# Patient Record
Sex: Female | Born: 1935
Health system: Southern US, Community
[De-identification: ages and names within clinical notes are randomized; demographics above are authoritative.]

## PROBLEM LIST (undated history)

## (undated) DIAGNOSIS — E7800 Pure hypercholesterolemia, unspecified: Secondary | ICD-10-CM

## (undated) DIAGNOSIS — F329 Major depressive disorder, single episode, unspecified: Secondary | ICD-10-CM

## (undated) DIAGNOSIS — K529 Noninfective gastroenteritis and colitis, unspecified: Secondary | ICD-10-CM

## (undated) DIAGNOSIS — K219 Gastro-esophageal reflux disease without esophagitis: Secondary | ICD-10-CM

## (undated) DIAGNOSIS — I10 Essential (primary) hypertension: Secondary | ICD-10-CM

## (undated) DIAGNOSIS — M84352K Stress fracture, left femur, subsequent encounter for fracture with nonunion: Secondary | ICD-10-CM

## (undated) DIAGNOSIS — M1990 Unspecified osteoarthritis, unspecified site: Secondary | ICD-10-CM

## (undated) DIAGNOSIS — G8929 Other chronic pain: Secondary | ICD-10-CM

## (undated) DIAGNOSIS — I639 Cerebral infarction, unspecified: Secondary | ICD-10-CM

## (undated) DIAGNOSIS — N059 Unspecified nephritic syndrome with unspecified morphologic changes: Secondary | ICD-10-CM

## (undated) DIAGNOSIS — K429 Umbilical hernia without obstruction or gangrene: Secondary | ICD-10-CM

## (undated) DIAGNOSIS — F419 Anxiety disorder, unspecified: Secondary | ICD-10-CM

## (undated) DIAGNOSIS — M353 Polymyalgia rheumatica: Secondary | ICD-10-CM

## (undated) DIAGNOSIS — I519 Heart disease, unspecified: Secondary | ICD-10-CM

## (undated) DIAGNOSIS — R011 Cardiac murmur, unspecified: Secondary | ICD-10-CM

## (undated) DIAGNOSIS — E119 Type 2 diabetes mellitus without complications: Secondary | ICD-10-CM

## (undated) DIAGNOSIS — K469 Unspecified abdominal hernia without obstruction or gangrene: Secondary | ICD-10-CM

## (undated) DIAGNOSIS — M549 Dorsalgia, unspecified: Secondary | ICD-10-CM

## (undated) DIAGNOSIS — F32A Depression, unspecified: Secondary | ICD-10-CM

## (undated) DIAGNOSIS — C50919 Malignant neoplasm of unspecified site of unspecified female breast: Secondary | ICD-10-CM

## (undated) DIAGNOSIS — J40 Bronchitis, not specified as acute or chronic: Secondary | ICD-10-CM

## (undated) HISTORY — PX: TUBAL LIGATION: SHX77

## (undated) HISTORY — DX: Heart disease, unspecified: I51.9

---

## 1971-09-23 DIAGNOSIS — C50919 Malignant neoplasm of unspecified site of unspecified female breast: Secondary | ICD-10-CM

## 1971-09-23 HISTORY — DX: Malignant neoplasm of unspecified site of unspecified female breast: C50.919

## 1971-09-23 HISTORY — PX: MASTECTOMY: SHX3

## 1979-09-23 HISTORY — PX: BREAST BIOPSY: SHX20

## 1998-10-02 ENCOUNTER — Other Ambulatory Visit: Admission: RE | Admit: 1998-10-02 | Discharge: 1998-10-02 | Payer: Self-pay | Admitting: Gynecology

## 1999-09-12 ENCOUNTER — Encounter: Admission: RE | Admit: 1999-09-12 | Discharge: 1999-09-12 | Payer: Self-pay | Admitting: Surgery

## 1999-09-12 ENCOUNTER — Encounter: Payer: Self-pay | Admitting: Surgery

## 1999-10-15 ENCOUNTER — Other Ambulatory Visit: Admission: RE | Admit: 1999-10-15 | Discharge: 1999-10-15 | Payer: Self-pay | Admitting: Gynecology

## 2000-06-22 HISTORY — PX: SHOULDER ARTHROSCOPY: SHX128

## 2000-06-23 ENCOUNTER — Ambulatory Visit (HOSPITAL_BASED_OUTPATIENT_CLINIC_OR_DEPARTMENT_OTHER): Admission: RE | Admit: 2000-06-23 | Discharge: 2000-06-23 | Payer: Self-pay | Admitting: Orthopedic Surgery

## 2000-09-17 ENCOUNTER — Encounter: Admission: RE | Admit: 2000-09-17 | Discharge: 2000-09-17 | Payer: Self-pay | Admitting: Internal Medicine

## 2000-09-17 ENCOUNTER — Encounter: Payer: Self-pay | Admitting: Internal Medicine

## 2000-10-20 ENCOUNTER — Other Ambulatory Visit: Admission: RE | Admit: 2000-10-20 | Discharge: 2000-10-20 | Payer: Self-pay | Admitting: Gynecology

## 2000-11-24 ENCOUNTER — Encounter: Admission: RE | Admit: 2000-11-24 | Discharge: 2000-11-24 | Payer: Self-pay | Admitting: Gynecology

## 2000-11-24 ENCOUNTER — Encounter: Payer: Self-pay | Admitting: Gynecology

## 2001-05-25 ENCOUNTER — Encounter: Admission: RE | Admit: 2001-05-25 | Discharge: 2001-08-23 | Payer: Self-pay | Admitting: Internal Medicine

## 2001-09-23 ENCOUNTER — Encounter: Payer: Self-pay | Admitting: Gynecology

## 2001-09-23 ENCOUNTER — Encounter: Admission: RE | Admit: 2001-09-23 | Discharge: 2001-09-23 | Payer: Self-pay | Admitting: Gynecology

## 2001-10-26 ENCOUNTER — Other Ambulatory Visit: Admission: RE | Admit: 2001-10-26 | Discharge: 2001-10-26 | Payer: Self-pay | Admitting: Gynecology

## 2002-09-27 ENCOUNTER — Encounter: Admission: RE | Admit: 2002-09-27 | Discharge: 2002-09-27 | Payer: Self-pay | Admitting: Surgery

## 2002-09-27 ENCOUNTER — Encounter: Payer: Self-pay | Admitting: Surgery

## 2002-11-01 ENCOUNTER — Other Ambulatory Visit: Admission: RE | Admit: 2002-11-01 | Discharge: 2002-11-01 | Payer: Self-pay | Admitting: Gynecology

## 2003-02-07 ENCOUNTER — Encounter: Admission: RE | Admit: 2003-02-07 | Discharge: 2003-02-07 | Payer: Self-pay | Admitting: Orthopedic Surgery

## 2003-02-07 ENCOUNTER — Encounter: Payer: Self-pay | Admitting: Orthopedic Surgery

## 2003-10-10 ENCOUNTER — Encounter: Admission: RE | Admit: 2003-10-10 | Discharge: 2003-10-10 | Payer: Self-pay | Admitting: Gynecology

## 2003-11-07 ENCOUNTER — Other Ambulatory Visit: Admission: RE | Admit: 2003-11-07 | Discharge: 2003-11-07 | Payer: Self-pay | Admitting: Gynecology

## 2004-10-10 ENCOUNTER — Encounter: Admission: RE | Admit: 2004-10-10 | Discharge: 2004-10-10 | Payer: Self-pay | Admitting: Surgery

## 2004-11-12 ENCOUNTER — Other Ambulatory Visit: Admission: RE | Admit: 2004-11-12 | Discharge: 2004-11-12 | Payer: Self-pay | Admitting: Gynecology

## 2005-07-11 ENCOUNTER — Encounter: Admission: RE | Admit: 2005-07-11 | Discharge: 2005-07-11 | Payer: Self-pay | Admitting: Internal Medicine

## 2005-10-14 ENCOUNTER — Encounter: Admission: RE | Admit: 2005-10-14 | Discharge: 2005-10-14 | Payer: Self-pay | Admitting: Surgery

## 2005-11-18 ENCOUNTER — Other Ambulatory Visit: Admission: RE | Admit: 2005-11-18 | Discharge: 2005-11-18 | Payer: Self-pay | Admitting: Gynecology

## 2006-09-03 ENCOUNTER — Encounter: Admission: RE | Admit: 2006-09-03 | Discharge: 2006-09-03 | Payer: Self-pay | Admitting: Internal Medicine

## 2006-10-15 ENCOUNTER — Encounter: Admission: RE | Admit: 2006-10-15 | Discharge: 2006-10-15 | Payer: Self-pay | Admitting: Surgery

## 2006-12-10 ENCOUNTER — Other Ambulatory Visit: Admission: RE | Admit: 2006-12-10 | Discharge: 2006-12-10 | Payer: Self-pay | Admitting: Gynecology

## 2007-10-19 ENCOUNTER — Encounter: Admission: RE | Admit: 2007-10-19 | Discharge: 2007-10-19 | Payer: Self-pay | Admitting: Internal Medicine

## 2008-03-02 ENCOUNTER — Encounter: Admission: RE | Admit: 2008-03-02 | Discharge: 2008-03-02 | Payer: Self-pay | Admitting: Orthopedic Surgery

## 2008-07-13 ENCOUNTER — Encounter: Admission: RE | Admit: 2008-07-13 | Discharge: 2008-07-13 | Payer: Self-pay | Admitting: Rheumatology

## 2008-10-20 ENCOUNTER — Encounter: Admission: RE | Admit: 2008-10-20 | Discharge: 2008-10-20 | Payer: Self-pay | Admitting: Internal Medicine

## 2009-10-23 ENCOUNTER — Encounter: Admission: RE | Admit: 2009-10-23 | Discharge: 2009-10-23 | Payer: Self-pay | Admitting: Surgery

## 2010-06-29 ENCOUNTER — Emergency Department (HOSPITAL_COMMUNITY): Admission: EM | Admit: 2010-06-29 | Discharge: 2010-06-29 | Payer: Self-pay | Admitting: Emergency Medicine

## 2010-10-30 ENCOUNTER — Other Ambulatory Visit: Payer: Self-pay | Admitting: Surgery

## 2010-10-30 DIAGNOSIS — Z853 Personal history of malignant neoplasm of breast: Secondary | ICD-10-CM

## 2010-11-07 ENCOUNTER — Ambulatory Visit
Admission: RE | Admit: 2010-11-07 | Discharge: 2010-11-07 | Disposition: A | Discharge status: Home / Self Care | Payer: Medicare Other | Source: Ambulatory Visit | Attending: Surgery | Admitting: Surgery

## 2010-11-07 DIAGNOSIS — Z853 Personal history of malignant neoplasm of breast: Secondary | ICD-10-CM

## 2011-02-07 NOTE — Op Note (Signed)
[facility]  Patient:    Cathy Taylor, Cathy Taylor                        MRN: [id number] Proc. Date: 06/23/00 Adm. Date:  [id number] Disc. Date: [id number] Attending:  Twana First                           Operative Report  PREOPERATIVE DIAGNOSIS:  Left shoulder rotator cuff tendonitis and impingement.  POSTOPERATIVE DIAGNOSES: 1. Left shoulder degenerative joint disease. 2. Left shoulder rotator cuff tendonitis and impingement.  PROCEDURE: 1. Left shoulder EUA followed by arthroscopic chondroplasty. 2. Left shoulder arthroscopic subacromial decompression.  SURGEON:  Elana Alm. Thurston Hole, M.D.  ASSISTANT:  Gifford Shave, P.A.  ANESTHESIA:  General.  OPERATIVE TIME:  45 minutes.  COMPLICATIONS:  None.  INDICATIONS FOR PROCEDURE:  [name] is a 77 year old woman who has had significantly increasing left shoulder pain for the past year, with symptoms and MRI documenting rotator cuff tendonitis and impingement.  She has failed conservative care and is now to undergo arthroscopy.  DESCRIPTION:  [name] is brought to the operating room on June 23, 2000 after supraclavicular block had been placed in the holding room.  She was placed on the operative table in supine position.  After being placed under general anesthesia, her left shoulder was examined under anesthesia.  Forward flexion to 170, abduction to 160, internal and external rotation of 80 degrees.  Shoulder was stable ligamentous exam.  She was then placed in a beach chair position and her shoulder and arm were prepped using sterile Betadine and draped using sterile technique.  Originally, through a posterior arthroscopic portal, the arthroscope with a pump attachment was placed and through an anterior portal, an arthroscopic probe was placed.  On initial inspection, the articular cartilage and the glenohumeral joint showed 75% grade 4 changes on the glenoid and the rest grade 3  changes with loose articular cartilage flaps that were thoroughly debrided.  She had diffuse grade 3 changes throughout her humeral head.  The anterior labrum showed partial tearing, 50% of the mid portion, which was debrided.  The superior labrum and biceps tendon anchor was intact.  The inferior labrum and anterior inferior glenohumeral ligaments were intact, and the posterior labrum was intact.  Biceps tendon itself was intact.  Inferior capsule recess was free of pathology.  The rotator cuff was thoroughly inspected.  She had fibrillation and evidence of tendonitis, but no evidence of a tear.  Subacromial space was entered, and a lateral arthroscopic portal was made. Moderately thickened bursitis was resected.  Underneath this, the rotator cuff was found to be moderately thickened and edematous, but no evidence of a tear. Subacromial decompression was carried out, removing 6-8 mm of the undersurface of the anterior, anteromedial, and anterolateral acromion, and CA ligament release carried out.  AC joint was not disturbed.  After this was done, the shoulder could be brought thorough a full range of motion with no impingement on the rotator cuff.  At this point, it was felt that all the pathology had been satisfactorily addressed.  The instruments were removed.  Portals were closed with 3-0 nylon suture and injected with 0.25% Marcaine with Epinephrine.  Sterile dressings and a sling were applied.  The patient was awakened and taken to the recovery room in stable condition.  FOLLOW-UP CARE:  [name] will be followed as an outpatient  on Vicodin and Naprosyn.  See her back in the office in a week for sutures out and follow-up.  Begin early physical therapy for range of motion followed by strengthening. DD:  06/23/00 TD:  06/23/00 Job: [id number]

## 2011-04-24 ENCOUNTER — Ambulatory Visit
Admission: RE | Admit: 2011-04-24 | Discharge: 2011-04-24 | Disposition: A | Discharge status: Home / Self Care | Payer: Medicare Other | Source: Ambulatory Visit | Attending: *Deleted | Admitting: *Deleted

## 2011-04-24 ENCOUNTER — Other Ambulatory Visit: Payer: Self-pay | Admitting: Internal Medicine

## 2011-04-24 ENCOUNTER — Other Ambulatory Visit: Payer: Self-pay | Admitting: *Deleted

## 2011-04-24 ENCOUNTER — Other Ambulatory Visit (HOSPITAL_COMMUNITY): Payer: Self-pay | Admitting: Orthopedic Surgery

## 2011-04-24 DIAGNOSIS — M19019 Primary osteoarthritis, unspecified shoulder: Secondary | ICD-10-CM

## 2011-04-24 DIAGNOSIS — S0990XA Unspecified injury of head, initial encounter: Secondary | ICD-10-CM

## 2011-04-24 DIAGNOSIS — M25519 Pain in unspecified shoulder: Secondary | ICD-10-CM

## 2011-05-02 ENCOUNTER — Encounter (HOSPITAL_COMMUNITY): Payer: Medicare Other

## 2011-05-02 ENCOUNTER — Encounter (HOSPITAL_COMMUNITY)
Admission: RE | Admit: 2011-05-02 | Discharge: 2011-05-02 | Disposition: A | Discharge status: Home / Self Care | Payer: Medicare Other | Source: Ambulatory Visit | Attending: Orthopedic Surgery | Admitting: Orthopedic Surgery

## 2011-05-02 ENCOUNTER — Ambulatory Visit (HOSPITAL_COMMUNITY): Payer: Medicare Other

## 2011-05-02 ENCOUNTER — Other Ambulatory Visit (HOSPITAL_COMMUNITY): Payer: Medicare Other

## 2011-05-02 DIAGNOSIS — M1990 Unspecified osteoarthritis, unspecified site: Secondary | ICD-10-CM | POA: Insufficient documentation

## 2011-05-02 DIAGNOSIS — W19XXXA Unspecified fall, initial encounter: Secondary | ICD-10-CM | POA: Insufficient documentation

## 2011-05-02 DIAGNOSIS — M19019 Primary osteoarthritis, unspecified shoulder: Secondary | ICD-10-CM

## 2011-05-02 DIAGNOSIS — M51379 Other intervertebral disc degeneration, lumbosacral region without mention of lumbar back pain or lower extremity pain: Secondary | ICD-10-CM | POA: Insufficient documentation

## 2011-05-02 DIAGNOSIS — M25519 Pain in unspecified shoulder: Secondary | ICD-10-CM

## 2011-05-02 DIAGNOSIS — M5137 Other intervertebral disc degeneration, lumbosacral region: Secondary | ICD-10-CM | POA: Insufficient documentation

## 2011-05-02 DIAGNOSIS — IMO0002 Reserved for concepts with insufficient information to code with codable children: Secondary | ICD-10-CM | POA: Insufficient documentation

## 2011-05-02 DIAGNOSIS — M79609 Pain in unspecified limb: Secondary | ICD-10-CM | POA: Insufficient documentation

## 2011-05-02 MED ORDER — TECHNETIUM TC 99M MEDRONATE IV KIT
25.0000 | PACK | Freq: Once | INTRAVENOUS | Status: AC | PRN
  Administered 2011-05-02: 25 via INTRAVENOUS

## 2011-07-07 ENCOUNTER — Encounter (HOSPITAL_COMMUNITY)
Admission: RE | Admit: 2011-07-07 | Discharge: 2011-07-07 | Disposition: A | Discharge status: Home / Self Care | Payer: Medicare Other | Source: Ambulatory Visit | Attending: Orthopedic Surgery | Admitting: Orthopedic Surgery

## 2011-07-07 ENCOUNTER — Other Ambulatory Visit (HOSPITAL_COMMUNITY): Payer: Self-pay | Admitting: Orthopedic Surgery

## 2011-07-07 ENCOUNTER — Ambulatory Visit (HOSPITAL_COMMUNITY)
Admission: RE | Admit: 2011-07-07 | Discharge: 2011-07-07 | Disposition: A | Discharge status: Home / Self Care | Payer: Medicare Other | Source: Ambulatory Visit | Attending: Orthopedic Surgery | Admitting: Orthopedic Surgery

## 2011-07-07 DIAGNOSIS — Z01812 Encounter for preprocedural laboratory examination: Secondary | ICD-10-CM | POA: Insufficient documentation

## 2011-07-07 DIAGNOSIS — Z01818 Encounter for other preprocedural examination: Secondary | ICD-10-CM | POA: Insufficient documentation

## 2011-07-07 DIAGNOSIS — I499 Cardiac arrhythmia, unspecified: Secondary | ICD-10-CM | POA: Insufficient documentation

## 2011-07-07 DIAGNOSIS — Z01810 Encounter for preprocedural cardiovascular examination: Secondary | ICD-10-CM | POA: Insufficient documentation

## 2011-07-07 DIAGNOSIS — M84353A Stress fracture, unspecified femur, initial encounter for fracture: Secondary | ICD-10-CM

## 2011-07-07 DIAGNOSIS — I4460 Unspecified fascicular block: Secondary | ICD-10-CM | POA: Insufficient documentation

## 2011-07-07 HISTORY — PX: FEMUR SURGERY: SHX943

## 2011-07-07 LAB — DIFFERENTIAL
Basophils Absolute: 0 10*3/uL (ref 0.0–0.1)
Basophils Relative: 0 % (ref 0–1)
Eosinophils Absolute: 0.1 10*3/uL (ref 0.0–0.7)
Lymphs Abs: 2.6 10*3/uL (ref 0.7–4.0)
Monocytes Relative: 8 % (ref 3–12)
Neutro Abs: 9 10*3/uL — ABNORMAL HIGH (ref 1.7–7.7)
Neutrophils Relative %: 71 % (ref 43–77)

## 2011-07-07 LAB — COMPREHENSIVE METABOLIC PANEL
ALT: 17 U/L (ref 0–35)
Alkaline Phosphatase: 73 U/L (ref 39–117)
BUN: 14 mg/dL (ref 6–23)
GFR calc non Af Amer: 79 mL/min — ABNORMAL LOW (ref >90)
Glucose, Bld: 107 mg/dL — ABNORMAL HIGH (ref 70–99)
Sodium: 136 mEq/L (ref 135–145)
Total Protein: 7.2 g/dL (ref 6.0–8.3)

## 2011-07-07 LAB — TYPE AND SCREEN

## 2011-07-07 LAB — CBC
HCT: 38.4 % (ref 36.0–46.0)
Platelets: 320 10*3/uL (ref 150–400)
RDW: 14.5 % (ref 11.5–15.5)

## 2011-07-07 LAB — PROTIME-INR: INR: 0.91 (ref 0.00–1.49)

## 2011-07-07 LAB — ABO/RH: ABO/RH(D): O POS

## 2011-07-08 ENCOUNTER — Inpatient Hospital Stay (HOSPITAL_COMMUNITY)
Admission: RE | Admit: 2011-07-08 | Discharge: 2011-07-10 | DRG: 481 | Disposition: A | Discharge status: Home Health | Payer: Medicare Other | Source: Ambulatory Visit | Attending: Orthopedic Surgery | Admitting: Orthopedic Surgery

## 2011-07-08 ENCOUNTER — Inpatient Hospital Stay (HOSPITAL_COMMUNITY): Payer: Medicare Other

## 2011-07-08 ENCOUNTER — Encounter (HOSPITAL_COMMUNITY): Payer: Self-pay | Admitting: Orthopedic Surgery

## 2011-07-08 DIAGNOSIS — Z01812 Encounter for preprocedural laboratory examination: Secondary | ICD-10-CM

## 2011-07-08 DIAGNOSIS — E7800 Pure hypercholesterolemia, unspecified: Secondary | ICD-10-CM | POA: Diagnosis present

## 2011-07-08 DIAGNOSIS — E119 Type 2 diabetes mellitus without complications: Secondary | ICD-10-CM | POA: Diagnosis present

## 2011-07-08 DIAGNOSIS — M069 Rheumatoid arthritis, unspecified: Secondary | ICD-10-CM | POA: Diagnosis present

## 2011-07-08 DIAGNOSIS — D62 Acute posthemorrhagic anemia: Secondary | ICD-10-CM | POA: Diagnosis not present

## 2011-07-08 DIAGNOSIS — Y998 Other external cause status: Secondary | ICD-10-CM

## 2011-07-08 DIAGNOSIS — M84352K Stress fracture, left femur, subsequent encounter for fracture with nonunion: Secondary | ICD-10-CM | POA: Diagnosis present

## 2011-07-08 DIAGNOSIS — M84353A Stress fracture, unspecified femur, initial encounter for fracture: Principal | ICD-10-CM | POA: Diagnosis present

## 2011-07-08 DIAGNOSIS — I10 Essential (primary) hypertension: Secondary | ICD-10-CM | POA: Diagnosis present

## 2011-07-08 DIAGNOSIS — Z01810 Encounter for preprocedural cardiovascular examination: Secondary | ICD-10-CM

## 2011-07-08 DIAGNOSIS — X58XXXA Exposure to other specified factors, initial encounter: Secondary | ICD-10-CM | POA: Diagnosis present

## 2011-07-08 HISTORY — DX: Stress fracture, left femur, subsequent encounter for fracture with nonunion: M84.352K

## 2011-07-08 LAB — GLUCOSE, CAPILLARY: Glucose-Capillary: 141 mg/dL — ABNORMAL HIGH (ref 70–99)

## 2011-07-09 LAB — BASIC METABOLIC PANEL
BUN: 12 mg/dL (ref 6–23)
CO2: 27 mEq/L (ref 19–32)
Chloride: 101 mEq/L (ref 96–112)
Creatinine, Ser: 0.75 mg/dL (ref 0.50–1.10)
Glucose, Bld: 181 mg/dL — ABNORMAL HIGH (ref 70–99)

## 2011-07-09 LAB — CBC
HCT: 27.3 % — ABNORMAL LOW (ref 36.0–46.0)
Hemoglobin: 9.4 g/dL — ABNORMAL LOW (ref 12.0–15.0)
MCV: 82 fL (ref 78.0–100.0)
RDW: 14.6 % (ref 11.5–15.5)
WBC: 13.6 10*3/uL — ABNORMAL HIGH (ref 4.0–10.5)

## 2011-07-10 NOTE — Op Note (Signed)
  NAMEMARVELLE, SPAN NO.:  [id number]  MEDICAL RECORD NO.:  [id number]  LOCATION:  2550                         FACILITY:  [facility]  PHYSICIAN:  Eulas Post, MD    DATE OF BIRTH:  [birth year]  DATE OF PROCEDURE:  07/08/2011 DATE OF DISCHARGE:                              OPERATIVE REPORT   ATTENDING SURGEON:  Eulas Post, MD.  FIRST ASSISTANT:  Janace Litten, orthopedic PA-C  PREOPERATIVE DIAGNOSIS:  Left femoral shaft stress fracture.  POSTOPERATIVE DIAGNOSIS:  Left femoral shaft stress fracture.  OPERATIVE PROCEDURE:  Left femoral shaft intramedullary nailing.  ANESTHESIA:  General.  ESTIMATED BLOOD LOSS:  50 mL.  OPERATIVE IMPLANTS:  Synthes lateral entry recon nail, size 9 mm x 380 mm with a single size 40 interlocking bolt transversely at the level of the lesser trochanter.  PREOPERATIVE INDICATIONS:  Ms. Icel Castles is a 77 year old woman who had a stress fracture of the left midshaft femur.  She failed conservative measures and continued to have progressive ongoing pain. She elected for surgical management.  The risks, benefits, and alternatives were discussed before the procedure including but not limited to risks of infection, bleeding, nerve injury, progression to complete fracture, incomplete relief of pain, persistent stress fracture, the need for revision surgery, cardiopulmonary complications, among others and she is willing to proceed.  OPERATIVE PROCEDURE:  The patient was brought to the operating room and placed in supine position.  IV antibiotics were given.  General anesthesia was administered.  She was placed on the fracture table.  The left lower extremity was placed in suspension and the leg was prepped and draped in usual sterile fashion.  Incision was made proximal to the greater trochanter.  Dissection was carried down and a guidewire was introduced into the appropriate position on the greater trochanter.  I then  reamed and opened the proximal femur and then placed the guidewire. I measured the length of the femur and then sequentially reamed up to a 10.5.  I had abundant chatter.  I selected size 9 nail.  I then inserted the nail without significant difficulty.  The C-arm was used to confirm position on AP and lateral views up and down the femur.  The proximal interlocking bolt was placed in the dynamic position in order to allow as much compression as possible at the stress fracture site.  Bicortical fixation was achieved.  Final C-arm pictures were taken and the jig apparatus was removed.  The wounds were closed with Vicryl and Monocryl with Steri-Strips and sterile gauze. She was awakened and returned to the PACU in stable and satisfactory condition.  There were no complications and she tolerated the procedure well.     Eulas Post, MD     JPL/MEDQ  D:  07/08/2011  T:  07/08/2011  Job:  [id number]  Electronically Signed by Teryl Lucy MD on 07/10/2011 10:42:20 AM

## 2011-07-14 NOTE — Discharge Summary (Signed)
  Cathy Taylor, Cathy Taylor                 ACCOUNT NO.:  [id number]  MEDICAL RECORD NO.:  [id number]  LOCATION:  5527                         FACILITY:  [facility]  PHYSICIAN:  Eulas Post, MD    DATE OF BIRTH:  1933/01/06  DATE OF ADMISSION:  07/08/2011 DATE OF DISCHARGE:  07/10/2011                              DISCHARGE SUMMARY   ADMISSION DIAGNOSIS:  Left femoral shaft stress fracture.  DISCHARGE DIAGNOSIS:  Left femoral shaft stress fracture.  PRIMARY PROCEDURE:  Left femoral shaft intramedullary nailing  HOSPITAL COURSE:  [name] is a 77 year old woman who had a stress fracture at left femur.  She elected for intramedullary nailing. She tolerated the procedure well and postoperatively did not have any complications.  She actually said her pain was nearly resolved at her stress fracture region after the nail was placed.  She was ambulating reasonably well with physical therapy and is planning to be discharged home with follow up with me in 2 weeks.  She is weightbearing as tolerated, and was taking p.o. analgesics.  She was given perioperative antibiotics for antimicrobial prophylaxis.  She was also given Lovenox bridging to Coumadin for DVT prophylaxis.  She benefited maximally from her hospital stay and will follow up with me in 2 weeks.  There were no complications.     Eulas Post, MD     JPL/MEDQ  D:  07/10/2011  T:  07/11/2011  Job:  [id number]  Electronically Signed by Teryl Lucy MD on 07/14/2011 82:95:62 PM

## 2011-07-24 HISTORY — PX: CATARACT EXTRACTION W/ INTRAOCULAR LENS IMPLANT: SHX1309

## 2011-09-10 ENCOUNTER — Other Ambulatory Visit: Payer: Self-pay

## 2011-09-10 ENCOUNTER — Encounter: Payer: Self-pay | Admitting: Internal Medicine

## 2011-09-10 ENCOUNTER — Emergency Department (HOSPITAL_COMMUNITY): Payer: Medicare Other

## 2011-09-10 ENCOUNTER — Other Ambulatory Visit: Payer: Self-pay | Admitting: Internal Medicine

## 2011-09-10 ENCOUNTER — Inpatient Hospital Stay (HOSPITAL_COMMUNITY)
Admission: EM | Admit: 2011-09-10 | Discharge: 2011-09-12 | DRG: 202 | Disposition: A | Discharge status: Home Health | Payer: Medicare Other | Source: Ambulatory Visit | Attending: Internal Medicine | Admitting: Internal Medicine

## 2011-09-10 ENCOUNTER — Inpatient Hospital Stay: Admission: AD | Admit: 2011-09-10 | Payer: Medicare Other | Source: Ambulatory Visit | Admitting: Internal Medicine

## 2011-09-10 DIAGNOSIS — I10 Essential (primary) hypertension: Secondary | ICD-10-CM | POA: Diagnosis present

## 2011-09-10 DIAGNOSIS — Z853 Personal history of malignant neoplasm of breast: Secondary | ICD-10-CM

## 2011-09-10 DIAGNOSIS — C50919 Malignant neoplasm of unspecified site of unspecified female breast: Secondary | ICD-10-CM | POA: Insufficient documentation

## 2011-09-10 DIAGNOSIS — R0600 Dyspnea, unspecified: Secondary | ICD-10-CM | POA: Insufficient documentation

## 2011-09-10 DIAGNOSIS — M353 Polymyalgia rheumatica: Secondary | ICD-10-CM | POA: Insufficient documentation

## 2011-09-10 DIAGNOSIS — R0609 Other forms of dyspnea: Secondary | ICD-10-CM | POA: Diagnosis present

## 2011-09-10 DIAGNOSIS — F341 Dysthymic disorder: Secondary | ICD-10-CM | POA: Diagnosis present

## 2011-09-10 DIAGNOSIS — J9819 Other pulmonary collapse: Secondary | ICD-10-CM | POA: Diagnosis present

## 2011-09-10 DIAGNOSIS — E119 Type 2 diabetes mellitus without complications: Secondary | ICD-10-CM | POA: Insufficient documentation

## 2011-09-10 DIAGNOSIS — E871 Hypo-osmolality and hyponatremia: Secondary | ICD-10-CM | POA: Diagnosis present

## 2011-09-10 DIAGNOSIS — IMO0002 Reserved for concepts with insufficient information to code with codable children: Secondary | ICD-10-CM

## 2011-09-10 DIAGNOSIS — J40 Bronchitis, not specified as acute or chronic: Principal | ICD-10-CM | POA: Diagnosis present

## 2011-09-10 DIAGNOSIS — R0989 Other specified symptoms and signs involving the circulatory and respiratory systems: Secondary | ICD-10-CM | POA: Diagnosis present

## 2011-09-10 HISTORY — DX: Bronchitis, not specified as acute or chronic: J40

## 2011-09-10 HISTORY — DX: Pure hypercholesterolemia, unspecified: E78.00

## 2011-09-10 HISTORY — DX: Unspecified abdominal hernia without obstruction or gangrene: K46.9

## 2011-09-10 HISTORY — DX: Anxiety disorder, unspecified: F41.9

## 2011-09-10 HISTORY — DX: Other chronic pain: G89.29

## 2011-09-10 HISTORY — DX: Essential (primary) hypertension: I10

## 2011-09-10 HISTORY — DX: Dorsalgia, unspecified: M54.9

## 2011-09-10 HISTORY — DX: Gastro-esophageal reflux disease without esophagitis: K21.9

## 2011-09-10 HISTORY — DX: Depression, unspecified: F32.A

## 2011-09-10 HISTORY — DX: Cardiac murmur, unspecified: R01.1

## 2011-09-10 HISTORY — DX: Unspecified osteoarthritis, unspecified site: M19.90

## 2011-09-10 HISTORY — DX: Polymyalgia rheumatica: M35.3

## 2011-09-10 HISTORY — DX: Major depressive disorder, single episode, unspecified: F32.9

## 2011-09-10 LAB — CBC
HCT: 35.8 % — ABNORMAL LOW (ref 36.0–46.0)
Hemoglobin: 12.2 g/dL (ref 12.0–15.0)
Hemoglobin: 12.1 g/dL (ref 12.0–15.0)
MCH: 27.6 pg (ref 26.0–34.0)
MCH: 27.6 pg (ref 26.0–34.0)
MCHC: 34.1 g/dL (ref 30.0–36.0)
MCHC: 33.8 g/dL (ref 30.0–36.0)
MCV: 81 fL (ref 78.0–100.0)
MCV: 81.7 fL (ref 78.0–100.0)
Platelets: ADEQUATE K/uL (ref 150–400)
Platelets: 300 10*3/uL (ref 150–400)
RBC: 4.42 MIL/uL (ref 3.87–5.11)
RDW: 14.3 % (ref 11.5–15.5)
WBC: 11.1 K/uL — ABNORMAL HIGH (ref 4.0–10.5)

## 2011-09-10 LAB — STREP PNEUMONIAE URINARY ANTIGEN: Strep Pneumo Urinary Antigen: NEGATIVE

## 2011-09-10 LAB — GLUCOSE, CAPILLARY

## 2011-09-10 LAB — BASIC METABOLIC PANEL WITH GFR
BUN: 10 mg/dL (ref 6–23)
CO2: 21 meq/L (ref 19–32)
Calcium: 10.1 mg/dL (ref 8.4–10.5)
Chloride: 95 meq/L — ABNORMAL LOW (ref 96–112)
Creatinine, Ser: 0.63 mg/dL (ref 0.50–1.10)
GFR calc Af Amer: >90 mL/min
GFR calc non Af Amer: 85 mL/min — ABNORMAL LOW
Glucose, Bld: 86 mg/dL (ref 70–99)
Potassium: 4.4 meq/L (ref 3.5–5.1)
Sodium: 130 meq/L — ABNORMAL LOW (ref 135–145)

## 2011-09-10 LAB — DIFFERENTIAL
Basophils Absolute: 0 10*3/uL (ref 0.0–0.1)
Eosinophils Relative: 1 % (ref 0–5)
Lymphocytes Relative: 19 % (ref 12–46)
Monocytes Relative: 8 % (ref 3–12)

## 2011-09-10 LAB — D-DIMER, QUANTITATIVE: D-Dimer, Quant: 0.68 ug{FEU}/mL — ABNORMAL HIGH (ref 0.00–0.48)

## 2011-09-10 LAB — CREATININE, SERUM: Creatinine, Ser: 0.63 mg/dL (ref 0.50–1.10)

## 2011-09-10 MED ORDER — METHYLPREDNISOLONE SODIUM SUCC 40 MG IJ SOLR
40.0000 mg | Freq: Once | INTRAMUSCULAR | Status: AC
  Administered 2011-09-10: 40 mg via INTRAVENOUS
  Filled 2011-09-10: qty 1

## 2011-09-10 MED ORDER — TRAMADOL HCL 50 MG PO TABS
50.0000 mg | ORAL_TABLET | Freq: Four times a day (QID) | ORAL | Status: DC | PRN
  Filled 2011-09-10: qty 1

## 2011-09-10 MED ORDER — SODIUM CHLORIDE 0.9 % IV SOLN
250.0000 mL | INTRAVENOUS | Status: DC | PRN
  Administered 2011-09-11: 250 mL via INTRAVENOUS

## 2011-09-10 MED ORDER — IOHEXOL 300 MG/ML  SOLN
100.0000 mL | Freq: Once | INTRAMUSCULAR | Status: AC | PRN
  Administered 2011-09-10: 100 mL via INTRAVENOUS

## 2011-09-10 MED ORDER — LEVOFLOXACIN IN D5W 500 MG/100ML IV SOLN
500.0000 mg | INTRAVENOUS | Status: DC
  Administered 2011-09-10 – 2011-09-11 (×2): 500 mg via INTRAVENOUS
  Filled 2011-09-10 (×3): qty 100

## 2011-09-10 MED ORDER — DULOXETINE HCL 30 MG PO CPEP
30.0000 mg | ORAL_CAPSULE | Freq: Every day | ORAL | Status: DC
  Administered 2011-09-10 – 2011-09-12 (×3): 30 mg via ORAL
  Filled 2011-09-10 (×3): qty 1

## 2011-09-10 MED ORDER — SODIUM CHLORIDE 0.9 % IJ SOLN
3.0000 mL | INTRAMUSCULAR | Status: DC | PRN
  Administered 2011-09-11: 3 mL via INTRAVENOUS

## 2011-09-10 MED ORDER — ASPIRIN 81 MG PO CHEW
81.0000 mg | CHEWABLE_TABLET | Freq: Every day | ORAL | Status: DC
  Administered 2011-09-10 – 2011-09-12 (×3): 81 mg via ORAL
  Filled 2011-09-10 (×3): qty 1

## 2011-09-10 MED ORDER — SODIUM CHLORIDE 0.9 % IJ SOLN
3.0000 mL | Freq: Two times a day (BID) | INTRAMUSCULAR | Status: DC
  Administered 2011-09-11 – 2011-09-12 (×2): 3 mL via INTRAVENOUS

## 2011-09-10 MED ORDER — GLIMEPIRIDE 2 MG PO TABS
2.0000 mg | ORAL_TABLET | Freq: Every day | ORAL | Status: DC
  Administered 2011-09-11 – 2011-09-12 (×2): 2 mg via ORAL
  Filled 2011-09-10 (×3): qty 1

## 2011-09-10 MED ORDER — SIMVASTATIN 20 MG PO TABS
20.0000 mg | ORAL_TABLET | Freq: Every day | ORAL | Status: DC
  Filled 2011-09-10: qty 1

## 2011-09-10 MED ORDER — EZETIMIBE 10 MG PO TABS
10.0000 mg | ORAL_TABLET | Freq: Every day | ORAL | Status: DC
  Administered 2011-09-10 – 2011-09-12 (×3): 10 mg via ORAL
  Filled 2011-09-10 (×3): qty 1

## 2011-09-10 MED ORDER — DILTIAZEM HCL ER COATED BEADS 240 MG PO CP24
240.0000 mg | ORAL_CAPSULE | Freq: Every day | ORAL | Status: DC
  Administered 2011-09-10 – 2011-09-12 (×3): 240 mg via ORAL
  Filled 2011-09-10 (×3): qty 1

## 2011-09-10 MED ORDER — BENAZEPRIL HCL 20 MG PO TABS
20.0000 mg | ORAL_TABLET | Freq: Every day | ORAL | Status: DC
  Administered 2011-09-10 – 2011-09-12 (×3): 20 mg via ORAL
  Filled 2011-09-10 (×3): qty 1

## 2011-09-10 MED ORDER — HEPARIN SODIUM (PORCINE) 5000 UNIT/ML IJ SOLN
5000.0000 [IU] | Freq: Three times a day (TID) | INTRAMUSCULAR | Status: DC
  Administered 2011-09-10 – 2011-09-12 (×6): 5000 [IU] via SUBCUTANEOUS
  Filled 2011-09-10 (×8): qty 1

## 2011-09-10 MED ORDER — INSULIN ASPART 100 UNIT/ML ~~LOC~~ SOLN
0.0000 [IU] | Freq: Three times a day (TID) | SUBCUTANEOUS | Status: DC
  Administered 2011-09-11: 3 [IU] via SUBCUTANEOUS
  Administered 2011-09-11: 2 [IU] via SUBCUTANEOUS
  Administered 2011-09-11: 7 [IU] via SUBCUTANEOUS
  Administered 2011-09-12: 1 [IU] via SUBCUTANEOUS
  Administered 2011-09-12: 2 [IU] via SUBCUTANEOUS
  Filled 2011-09-10: qty 3

## 2011-09-10 MED ORDER — PREDNISONE 20 MG PO TABS
20.0000 mg | ORAL_TABLET | Freq: Two times a day (BID) | ORAL | Status: DC
  Administered 2011-09-10: 20 mg via ORAL
  Filled 2011-09-10 (×4): qty 1

## 2011-09-10 MED ORDER — PANTOPRAZOLE SODIUM 40 MG PO TBEC
40.0000 mg | DELAYED_RELEASE_TABLET | Freq: Every day | ORAL | Status: DC
  Administered 2011-09-10 – 2011-09-12 (×3): 40 mg via ORAL
  Filled 2011-09-10 (×3): qty 1

## 2011-09-10 NOTE — ED Notes (Signed)
1610-96 READY

## 2011-09-10 NOTE — ED Notes (Signed)
5524-02 

## 2011-09-10 NOTE — H&P (Signed)
Patient: Cathy Taylor, Cathy Taylor DOB: Oct 30, 1932 Age: 77 Y Sex: Female Phone: [phone] Primary Insurance: BLUE MEDICARE Payer ID: [place]BS[place] Address: [address], [place], [place] Encounter Date: 09/10/2011 Provider: Renford Dills, MD Appointment Facility: [facility]  Subjective: Chief Complaint(s):  RP COUGH / CONGESTION NOT ANY BETTER W/ SOB / CHEST PAIN   HPI:  General Patient presents with complaint of cough and congestion not any better, she's now having some shortness of breath and some left-sided chest pain. She thinks it is from excessive coughing. At last visit she was not wheezing however today she does have audible wheezing. Patient is a nonsmoker. Her O2 sat is low today, she recently had an orthopedic procedure for a stress femur fracture. Despite albuterol treatment patient is still hypoxic, admission to the hospital as deemed necessary for further evaluation and treatment.  Current Medication:  Vitamin D 2000 UNIT Tablet one capsule once a day     Aspirin EC 81mg  Tablet Delayed Release daily Once a day     Citracal + D 250-200 MG-UNIT Tablet 1 tablet qd     Osteo Complex includes 1000 unit vitamin D Capsule 1 tablet qd     Cardizem CD 240 MG Capsule Extended Release 24 Hour 1 capsule Once a day     Voltaren 1 % Gel-5 pack as directed qid prn     Nexium 40 MG Capsule Delayed Release 1 capsule once a day     Glimepiride 1 MG Tablet TAKE 1 TABLET BY MOUTH EVERY DAY     Simvastatin 40 MG tablet 1 Once a day     Celebrex 200 MG Capsule TAKE 1 CAPSULE BY MOUTH ONCE A DAY     Lotensin HCT 20-12.5 MG Tablet 1 tablet Once a day     Lotrisone 1-0.05 % Cream 1 application to affected area Twice a day     Tylenol Ex St Arthritis Pain 500 MG Tablet 1 tablet as needed every 6 hrs     Zetia 10 MG Tablet 1 tablet Once a day     Cymbalta 30 MG Capsule Delayed Release Particles 1 capsule samples     PredniSONE 5 MG Tablet 1/2 tab daily     Metformin HCl 500  MG Tablet TAKE 2 TABLET BY MOUTH TWICE A DAY     Tramadol 50 mg tablet one tab prn     Azithromycin 250 MG Tablet 2 tablet on the first day, then 1 tablet daily for 4 days Once a day   Medical History:  bilateral thigh pain/stress reaction right left femur     history of polymyalgia rheumatica     osteoarthritis (left shoulder, bilateral knees, lumbar spine, [place] and Bouchard nodes)     GERD     hyperlipidemia     diabetes mellitus     insomnia     anxiety     hypertension     cataracts   Allergies/Intolerance:  N.K.D.A.   Surgical History:  left shoulder surgery 2001     chalazion     right mastectomy 1973 secondary to ductal carcinoma in situ     left breast biopsy 1975     tubal ligation     left femur nailing for stress fracture November 2012   Hospitalization:  surgery   Family History:  Father: deceased heart disease, blood clots     Mother: deceased hypertension     Brother 1: alive     Sister 1: alive fibromyalgia  Sister 2: alive diabetes mellitus     Sister 3: alive diabetes mellitus     1 brother(s) , 3 sister(s) . 2 son(s) , 3 daughter(s) .   Social History: General History of smoking cigarettes: Never smoked.  no Smoking.  no Alcohol.  no Recreational drug use.  ROS:   Objective: Vitals: Wt 171.6, Ht 62, BMI 31.38, Temp 98.0, Pulse sitting 76, BP sitting 142/78, Oxygen sat % 88%   Past Results: Examination:  General Examination Slightly anxious" label="GENERAL APPEARANCE" categoryPropId="10089" examid="[id number]"GENERAL APPEARANCE Slightly anxious. Reproducible chest wall pain with palpation left greater than right" label="CHEST:" categoryPropId="10663" examid="[id number]"CHEST: Reproducible chest wall pain with palpation left greater than right. Bilateral expiratory wheeze rhonchi" label="LUNGS:" categoryPropId="87" examid="[id number]"LUNGS: Bilateral expiratory wheeze rhonchi.  Physical Examination:   Assessment: Assessment:   Bronchitis - 490 (Primary), worst now with wheezing on exam     Dyspnea - 786.05, with recent ortho procedure dyspnea and low oxygen sat will check ddimer ct if elevated.     Plan: Treatment: Bronchitis Start Tussionex Pennkinetic ER Liquid Extended Release, 8-10 MG/5ML, 5 ml as needed, Orally, every 12 hrs, 10 days, 100 ml, Refills 0 Start PredniSONE 10 MG 6 day taper Tablet, 10 MG, 6-5-4-3-2-1, Orally, Once daily, 6 days, 21, Refills 0 Imaging:Chest PA/Lat  Phairas,Jodi 09/10/2011 11:51:28 AM > , x-ray completed.  Procedure:INJ DEPOMEDROL 80 MG Tysor,Sandy 09/10/2011 11:38:07 AM > given rt delt im/mfx:pharmacia/lot-g26526/exp-02/2014/ ndc 0009-0306-02 Procedure:ALBUTEROL per HHN 2.5MG  in 3cc (Charge 3 Units)-includes charge for treatment Tysor,Sandy 09/10/2011 11:38:51 AM > given/mfx:nephron/lot 48mb6/exp 04/2012/ ndc 2841-3244-01 Procedure:PULSE OXIMETRY Tysor,Sandy 09/10/2011 11:41:21 AM > 90 % room air. Procedures: Immunizations: Diagnostic Imaging: Lab Reports: Preventive Medicine:    Next Appointment:   Billing Information: Visit Code: Procedure Codes:  J1040 DEPOMEDROL 80 MG.     02725 INHALATION THERAPY.     D6644 ALBUTEROL INHAL UNIT DOSE 1 MG.     71020 XR CHEST 2V.     94760 PULSE OXIMETRY.

## 2011-09-10 NOTE — Progress Notes (Signed)
Pharmacy Consult - Antibiotic Renal Adjustment  Pt on levaquin D#1/7 per pneumonia admission orders.   Ht 64 inches, Wt 77 kg, IBW 55 kg  Creat 0.63/estimated CrCl ~60 ml/min  Noted that sputum and blood cultures are pending.  Plan: Continue levaquin 500mg  IV q24h. This dose is appropriate for current renal function.  Thanks.  Christoper Fabian, PharmD, BCPS 09/10/11 1905

## 2011-09-10 NOTE — ED Notes (Signed)
Pt sent to ED for admission dx with pneumonia.  Pt already had admission orders in system.  Waiting for bed placement

## 2011-09-11 DIAGNOSIS — J9819 Other pulmonary collapse: Secondary | ICD-10-CM | POA: Diagnosis present

## 2011-09-11 DIAGNOSIS — R0902 Hypoxemia: Secondary | ICD-10-CM

## 2011-09-11 DIAGNOSIS — J189 Pneumonia, unspecified organism: Secondary | ICD-10-CM

## 2011-09-11 LAB — GLUCOSE, CAPILLARY
Glucose-Capillary: 301 mg/dL — ABNORMAL HIGH (ref 70–99)
Glucose-Capillary: 168 mg/dL — ABNORMAL HIGH (ref 70–99)
Glucose-Capillary: 241 mg/dL — ABNORMAL HIGH (ref 70–99)

## 2011-09-11 LAB — LEGIONELLA ANTIGEN, URINE: Legionella Antigen, Urine: NEGATIVE

## 2011-09-11 LAB — INFLUENZA PANEL BY PCR (TYPE A & B)
Influenza A By PCR: NEGATIVE
Influenza B By PCR: NEGATIVE

## 2011-09-11 MED ORDER — ROSUVASTATIN CALCIUM 5 MG PO TABS
5.0000 mg | ORAL_TABLET | Freq: Every day | ORAL | Status: DC
  Administered 2011-09-11: 5 mg via ORAL
  Filled 2011-09-11 (×2): qty 1

## 2011-09-11 MED ORDER — ALBUTEROL SULFATE (5 MG/ML) 0.5% IN NEBU
2.5000 mg | INHALATION_SOLUTION | Freq: Four times a day (QID) | RESPIRATORY_TRACT | Status: DC
  Administered 2011-09-11 – 2011-09-12 (×3): 2.5 mg via RESPIRATORY_TRACT
  Filled 2011-09-11 (×3): qty 0.5

## 2011-09-11 MED ORDER — PREDNISONE 10 MG PO TABS
10.0000 mg | ORAL_TABLET | Freq: Two times a day (BID) | ORAL | Status: DC
  Administered 2011-09-11: 10 mg via ORAL
  Filled 2011-09-11 (×4): qty 1

## 2011-09-11 NOTE — Progress Notes (Signed)
Physical Therapy Evaluation Patient Details Name: Cathy Taylor MRN: [id number] DOB: 04/23/1933 Today's Date: 09/11/2011  Problem List:  Patient Active Problem List  Diagnoses  . Dyspnea  . Hypertension  . Diabetes mellitus  . PMR (polymyalgia rheumatica)  . Breast cancer  . Collapsed lung    Past Medical History:  Past Medical History  Diagnosis Date  . Stress fracture of left femur with nonunion 07/08/2011  . PMR (polymyalgia rheumatica)   . GERD (gastroesophageal reflux disease)   . Diabetes mellitus   . Anxiety   . Hypertension   . Cancer   . Depression   . Abdominal hernia   . Hypercholesteremia   . Heart murmur   . Bronchitis 09/10/11    "that's what I'm here for"  . Arthritis   . Chronic back pain greater than 3 months duration    Past Surgical History:  Past Surgical History  Procedure Date  . Tubal ligation   . Femur im nail 06/2011    left  . Cataract extraction w/ intraocular lens implant 07/2011    left  . Shoulder arthroscopy 06/2000    left  . Breast surgery   . Breast biopsy 1981    left  . Mastectomy 1973    right    PT Assessment/Plan/Recommendation PT Assessment Clinical Impression Statement: Pt. with decreased mobility secondary to left lower lobe collapse.  Pt. able to ambulate in hallway on room air with O2 staying above 90% throughout.  Ambulation was slow however pt. stated this was normal for her as she has an old left femur fracture. Encouraged pt. to use her RW as opposed to a cane upon return home for safety.  No further acute PT needs at this time.  PT Recommendation/Assessment: Patent does not need any further PT services No Skilled PT: Patient is modified independent with all activity/mobility;Patient at baseline level of functioning PT Recommendation Follow Up Recommendations: None Equipment Recommended: None recommended by PT PT Goals     PT Evaluation Precautions/Restrictions    Prior Functioning  Home Living Lives  With: Spouse Type of Home: House Home Layout: One level Home Access: Stairs to enter Entrance Stairs-Rails: Left (Going up ) Entrance Stairs-Number of Steps: 3 Bathroom Shower/Tub: Health visitor: Handicapped height Bathroom Accessibility: No Home Adaptive Equipment: Straight cane;Walker - rolling;Bedside commode/3-in-1 Prior Function Level of Independence: Independent with basic ADLs;Needs assistance with homemaking;Independent with transfers;Independent with gait;Requires assistive device for independence Light Housekeeping: Moderate Driving: Yes Vocation: Retired Producer, television/film/video: Awake/alert Overall Cognitive Status: Appears within functional limits for tasks assessed Sensation/Coordination   Extremity Assessment RLE Assessment RLE Assessment: Within Functional Limits LLE Assessment LLE Assessment: Within Functional Limits Mobility (including Balance) Bed Mobility Bed Mobility: No Transfers Transfers: Yes Sit to Stand: 6: Modified independent (Device/Increase time);From bed Stand to Sit: 6: Modified independent (Device/Increase time);With armrests;To chair/3-in-1 Ambulation/Gait Ambulation/Gait: Yes Ambulation/Gait Assistance: 6: Modified independent (Device/Increase time) Ambulation Distance (Feet): 200 Feet Assistive device: Rolling walker Gait Pattern: Within Functional Limits Stairs: Yes Stairs Assistance: 6: Modified independent (Device/Increase time) Stair Management Technique: One rail Left;Step to pattern Number of Stairs: 3  Height of Stairs: 8     Exercise    End of Session PT - End of Session Equipment Utilized During Treatment: Gait belt Activity Tolerance: Patient tolerated treatment well Patient left: in chair;with call bell in reach;with family/visitor present Nurse Communication: Mobility status for transfers;Mobility status for ambulation General Behavior During Session: [facility] for tasks performed Cognition:  [facility] for  tasks performed  Laney Pastor, SPT  09/11/2011, 1:22 PM

## 2011-09-11 NOTE — Progress Notes (Signed)
Received order for medication assistance from admission RN, please be aware that pt has insurance with medication benefits,  CM unable to assist in this situation. Will review meds at d/c for very expensive meds which may have assistance plans from manufactures.  Starlyn Skeans RN MPH Case Manager [phone]

## 2011-09-11 NOTE — Progress Notes (Signed)
Seen and agree with SPT note Marializ Ferrebee Tabor, PT [phone]  

## 2011-09-11 NOTE — Consult Note (Signed)
Patient: Cathy Taylor DOB: 1933/07/28 Date of Admission: 09/10/2011            Pulmonary consult  Date of Consult: 09/11/2011 MD requesting consult:  Reason for consult:   HPI -  77 yo female with hx hypertension, DM, polymyalgia rheumatica, GERD who was recently treated as an outpatient with bronchitis. She was treated with a Z-Pak. However her symptoms continued to worsen and she presented to her primary care office on 12/19 with continued complaints of cough, congestion, shortness of breath and chest pain. In the office she was hypoxic despite nebulizer treatment and she was sent to [facility] for direct admission by Dr. Nehemiah Settle. On December 20 CT scan showed left lower lobe collapse.  Allergies:  No Known Allergies   PMH: Past Medical History  Diagnosis Date  . Stress fracture of left femur with nonunion 07/08/2011  . PMR (polymyalgia rheumatica)   . GERD (gastroesophageal reflux disease)   . Diabetes mellitus   . Anxiety   . Hypertension   . Cancer   . Depression   . Abdominal hernia   . Hypercholesteremia   . Heart murmur   . Bronchitis 09/10/11    "that's what I'm here for"  . Arthritis   . Chronic back pain greater than 3 months duration     Home meds: Medications Prior to Admission  Medication Dose Route Frequency Provider Last Rate Last Dose  . 0.9 %  sodium chloride infusion  250 mL Intravenous PRN Deirdre Peer Polite      . albuterol (PROVENTIL) (5 MG/ML) 0.5% nebulizer solution 2.5 mg  2.5 mg Nebulization Q6H Ronald D Polite      . aspirin chewable tablet 81 mg  81 mg Oral Daily Deirdre Peer Polite   81 mg at 09/10/11 2128  . benazepril (LOTENSIN) tablet 20 mg  20 mg Oral Daily Deirdre Peer Polite   20 mg at 09/10/11 2130  . diltiazem (CARDIZEM CD) 24 hr capsule 240 mg  240 mg Oral Daily Deirdre Peer Polite   240 mg at 09/10/11 2130  . DULoxetine (CYMBALTA) DR capsule 30 mg  30 mg Oral Daily Deirdre Peer Polite   30 mg at 09/10/11 2129  . ezetimibe (ZETIA) tablet 10 mg  10 mg  Oral Daily Deirdre Peer Polite   10 mg at 09/10/11 2129  . glimepiride (AMARYL) tablet 2 mg  2 mg Oral Q breakfast Deirdre Peer Polite   2 mg at 09/11/11 0846  . heparin injection 5,000 Units  5,000 Units Subcutaneous Q8H Deirdre Peer Polite   5,000 Units at 09/11/11 1610  . insulin aspart (novoLOG) injection 0-9 Units  0-9 Units Subcutaneous TID WC Deirdre Peer Polite   7 Units at 09/11/11 0846  . iohexol (OMNIPAQUE) 300 MG/ML solution 100 mL  100 mL Intravenous Once PRN Medication Radiologist   100 mL at 09/10/11 1841  . levofloxacin (LEVAQUIN) IVPB 500 mg  500 mg Intravenous Q24H Deirdre Peer Polite   500 mg at 09/10/11 2005  . methylPREDNISolone sodium succinate (SOLU-MEDROL) 40 MG injection 40 mg  40 mg Intravenous Once Deirdre Peer Polite   40 mg at 09/10/11 2127  . pantoprazole (PROTONIX) EC tablet 40 mg  40 mg Oral Daily Deirdre Peer Polite   40 mg at 09/10/11 2135  . predniSONE (DELTASONE) tablet 10 mg  10 mg Oral BID WC Deirdre Peer Polite      . rosuvastatin (CRESTOR) tablet 5 mg  5 mg Oral q1800 Hilario [place],  PHARMD      . sodium chloride 0.9 % injection 3 mL  3 mL Intravenous Q12H Ronald D Polite      . sodium chloride 0.9 % injection 3 mL  3 mL Intravenous PRN Deirdre Peer Polite      . traMADol (ULTRAM) tablet 50 mg  50 mg Oral Q6H PRN Deirdre Peer Polite      . DISCONTD: predniSONE (DELTASONE) tablet 20 mg  20 mg Oral BID WC Deirdre Peer Polite   20 mg at 09/10/11 2127  . DISCONTD: simvastatin (ZOCOR) tablet 20 mg  20 mg Oral q1800 Deirdre Peer Polite       No current outpatient prescriptions on file as of 09/11/2011.     Social Hx: History   Social History  . Marital Status: Married    Spouse Name: N/A    Number of Children: N/A  . Years of Education: N/A   Occupational History  . Not on file.   Social History Main Topics  . Smoking status: Former Smoker    Types: Cigarettes  . Smokeless tobacco: Never Used   Comment: "just tried smoking; didn't smoke to amount to nothing"  . Alcohol Use: No  . Drug  Use: No  . Sexually Active: Not Currently   Other Topics Concern  . Not on file   Social History Narrative  . No narrative on file     Family Hx: History reviewed. No pertinent family history.   ROS: Patient complains of chronic back pain due to arthritis, cough with purulent sputum, shortness of breath. States chest pain has resolved. He does also complain of some mild right temporal/right ear/right neck pain. Denies fevers, chills, nausea, vomiting, headache, hemoptysis. No recent sick contacts. All other systems were reviewed and were negative  Filed Vitals:   09/10/11 1726 09/10/11 1851 09/10/11 2100 09/11/11 0700  BP: 132/71 175/75 150/82 126/68  Pulse: 83 93 97 74  Temp: 98 F (36.7 C) 98.2 F (36.8 C) 99.1 F (37.3 C) 98.1 F (36.7 C)  TempSrc: Oral  Oral Oral  Resp: 19 20 18 18   Height:  5\' 4"  (1.626 m)    Weight:  169 lb 12.1 oz (77 kg)    SpO2: 94% 97% 93% 94%    Rads Ct Chest W Contrast  09/10/2011  *RADIOLOGY REPORT*  Clinical Data: Short of breath.  Hypoxia  CT CHEST WITH CONTRAST  Technique:  Multidetector CT imaging of the chest was performed following the standard protocol during bolus administration of intravenous contrast.  Contrast: OMNIPAQUE IOHEXOL 300 MG/ML IV SOLN  Comparison: Chest x-ray 07/07/2011  Findings: Radiology order was for CT chest with contrast.  This was performed.  A CTA was not performed.  However there is good opacification of the pulmonary arteries.  This is not as sensitive as CTA would be for pulmonary emboli.  Allowing for this, no large pulmonary emboli are identified.  There is partial collapse of the left lower lobe.  No pleural effusion.  Right lung is clear.  No underlying mass lesion is identified.  IMPRESSION: Left lower lobe collapse.  No large pulmonary embolism is identified.  CTA technique was not utilized on this study.  Critical Value/emergent results were called by telephone at the time of interpretation on 09/10/2011   at 1855 hours  to  Dr. Donette Larry, who verbally acknowledged these results.  Original Report Authenticated By: Camelia Phenes, M.D.   CBC    Component Value Date/Time   WBC  12.8* 09/10/2011 1918   RBC 4.38 09/10/2011 1918   HGB 12.1 09/10/2011 1918   HCT 35.8* 09/10/2011 1918   PLT 300 09/10/2011 1918   MCV 81.7 09/10/2011 1918   MCH 27.6 09/10/2011 1918   MCHC 33.8 09/10/2011 1918   RDW 14.2 09/10/2011 1918   LYMPHSABS 2.1 09/10/2011 1654   MONOABS 0.9 09/10/2011 1654   EOSABS 0.1 09/10/2011 1654   BASOSABS 0.0 09/10/2011 1654   BMET    Component Value Date/Time   NA 130* 09/10/2011 1654   K 4.4 09/10/2011 1654   CL 95* 09/10/2011 1654   CO2 21 09/10/2011 1654   GLUCOSE 86 09/10/2011 1654   BUN 10 09/10/2011 1654   CREATININE 0.63 09/10/2011 1918   CALCIUM 10.1 09/10/2011 1654   GFRNONAA 85* 09/10/2011 1918   GFRAA >90 09/10/2011 1918   EXAM: General appearance: NAD, conversant  Eyes: anicteric sclerae, moist conjunctivae; no lid-lag; PERRLA HENT: Atraumatic; oropharynx clear with moist mucous membranes and no mucosal ulcerations; normal hard and soft palate Neck: Trachea midline; FROM, supple, no thyromegaly or lymphadenopathy Lungs: normal respiratory effort and no intercostal retractions, right side clear, left side with scattered rhonchi and pleural rub CV: RRR, no MRGs  Abdomen: Soft, non-tender; no masses or HSM Extremities: No peripheral edema or extremity lymphadenopathy Skin: Normal temperature, turgor and texture; no rash, ulcers or subcutaneous nodules Psych: Appropriate affect, alert and oriented to person, place and time  IMPRESSION/ PLAN:  LLL atx/ collapse -- no underlying mass/ lesion seen on CT.  Likely d/t mucous plugging in setting purulent bronchitis v ?CAP. Failed outpt rx. Pt in no distress, looks good clinically.  PLAN -  Aggressive pulm hygiene with vibravest, flutter valve, IS BD Cont abx - levaquin ok F/u CXR to ensure resolution Sputum  culture Flu panel pending O2 as needed  Does not necessarily need steroids at this time - ok to cont low-dose taper back to chronic dose   WHITEHEART,KATHRYN, NP 09/11/2011  12:10 PM  *Care during the described time interval was provided by me and/or other providers on the critical care team. I have reviewed this patient's available data, including medical history, events of note, physical examination and test results as part of my evaluation.  Patient's atelectasis is localized to a single area, the airway appears inflamed with ?of bronchiectasis on the CT.  The patient likely has high sputum production and needs more aggressive vibravest/flutter valve/IS.  Once acute infection is addressed would repeat CT and if atelectasis remains will need FOB to evaluate airway.  But in the meantime continue treatment with Abx.  If FOB it will likely result in atypical cells due to active infection so would just repeat once infection is treated.  Patient seen and examined, agree with above note.  I dictated the care and orders written for this patient under my direction.  Koren Bound, M.D. [phone]

## 2011-09-11 NOTE — Progress Notes (Signed)
Subjective: Patient is doing well, she still has a cough, it is now nonproductive. She denies fever she denies chills. She is stable with nasal cannula oxygen. CT scan has been review which reveals left lower lobe collapse, no effusion, no large pulmonary embolism. There is no comment on endobronchial lesion either.  Objective: Vital signs in last 24 hours: Temp:  [98 F (36.7 C)-99.1 F (37.3 C)] 98.1 F (36.7 C) (12/20 0700) Pulse Rate:  [74-97] 74  (12/20 0700) Resp:  [18-20] 18  (12/20 0700) BP: (126-175)/(67-82) 126/68 mmHg (12/20 0700) SpO2:  [91 %-97 %] 94 % (12/20 0700) Weight:  [77 kg (169 lb 12.1 oz)] 169 lb 12.1 oz (77 kg) (12/19 1851) Weight change:  Last BM Date: 09/10/11  Intake/Output from previous day:   Intake/Output this shift:    General appearance: alert, cooperative and no distress Resp: bibasilar rhonchi, no expiratory wheeze Cardio: regular rate and rhythm, S1, S2 normal, no murmur, click, rub or gallop Extremities: extremities normal, atraumatic, no cyanosis or edema  Lab Results:  Basename 09/10/11 1918 09/10/11 1654  WBC 12.8* 11.1*  HGB 12.1 12.2  HCT 35.8* 35.8*  PLT 300 PLATELET CLUMPS NOTED ON SMEAR, COUNT APPEARS ADEQUATE   BMET  Basename 09/10/11 1918 09/10/11 1654  NA -- 130*  K -- 4.4  CL -- 95*  CO2 -- 21  GLUCOSE -- 86  BUN -- 10  CREATININE 0.63 0.63  CALCIUM -- 10.1    Studies/Results: Ct Chest W Contrast  09/10/2011  *RADIOLOGY REPORT*  Clinical Data: Short of breath.  Hypoxia  CT CHEST WITH CONTRAST  Technique:  Multidetector CT imaging of the chest was performed following the standard protocol during bolus administration of intravenous contrast.  Contrast: OMNIPAQUE IOHEXOL 300 MG/ML IV SOLN  Comparison: Chest x-ray 07/07/2011  Findings: Radiology order was for CT chest with contrast.  This was performed.  A CTA was not performed.  However there is good opacification of the pulmonary arteries.  This is not as sensitive  as CTA would be for pulmonary emboli.  Allowing for this, no large pulmonary emboli are identified.  There is partial collapse of the left lower lobe.  No pleural effusion.  Right lung is clear.  No underlying mass lesion is identified.  IMPRESSION: Left lower lobe collapse.  No large pulmonary embolism is identified.  CTA technique was not utilized on this study.  Critical Value/emergent results were called by telephone at the time of interpretation on 09/10/2011  at 1855 hours  to  Dr. Donette Larry, who verbally acknowledged these results.  Original Report Authenticated By: Camelia Phenes, M.D.    Medications:  Prior to Admission:  Prescriptions prior to admission  Medication Sig Dispense Refill  . acetaminophen (TYLENOL) 500 MG tablet Take 500 mg by mouth every 6 (six) hours as needed. For pain       . aspirin EC 81 MG tablet Take 81 mg by mouth daily.        . benazepril-hydrochlorthiazide (LOTENSIN HCT) 20-12.5 MG per tablet Take 1 tablet by mouth daily.        . Calcium Citrate (CITRACAL PO) Take 1,000 mg by mouth daily.        . celecoxib (CELEBREX) 200 MG capsule Take 200 mg by mouth daily.        . clorazepate (TRANXENE) 3.75 MG tablet Take 3.75 mg by mouth 2 (two) times daily as needed. For anxiety       . diltiazem (DILACOR XR)  240 MG 24 hr capsule Take 240 mg by mouth daily.        . DULoxetine (CYMBALTA) 30 MG capsule Take 30 mg by mouth daily.        Marland Kitchen esomeprazole (NEXIUM) 40 MG capsule Take 40 mg by mouth daily.        Marland Kitchen ezetimibe (ZETIA) 10 MG tablet Take 10 mg by mouth daily.        Marland Kitchen glimepiride (AMARYL) 1 MG tablet Take 1 mg by mouth daily.        . metFORMIN (GLUCOPHAGE) 500 MG tablet Take 500 mg by mouth See admin instructions. Take two tablets in the morning and one tablet at night       . Multiple Vitamins-Minerals (MULTIVITAMINS THER. W/MINERALS) TABS Take 1 tablet by mouth daily.        . simvastatin (ZOCOR) 40 MG tablet Take 40 mg by mouth at bedtime.         Scheduled:    . aspirin  81 mg Oral Daily  . benazepril  20 mg Oral Daily  . diltiazem  240 mg Oral Daily  . DULoxetine  30 mg Oral Daily  . ezetimibe  10 mg Oral Daily  . glimepiride  2 mg Oral Q breakfast  . heparin  5,000 Units Subcutaneous Q8H  . insulin aspart  0-9 Units Subcutaneous TID WC  . levofloxacin (LEVAQUIN) IV  500 mg Intravenous Q24H  . methylPREDNISolone (SOLU-MEDROL) injection  40 mg Intravenous Once  . pantoprazole  40 mg Oral Daily  . predniSONE  20 mg Oral BID WC  . simvastatin  20 mg Oral q1800  . sodium chloride  3 mL Intravenous Q12H   Continuous:  ZOX:WRUEAV chloride, iohexol, sodium chloride, traMADol  Assessment/Plan: Dyspnea, rule out pneumonia, CT scan shows left lower lobe atelectasis. There is no effusion no endobronchial lesion to account for this. There is no large pulmonary emboli. At this time will continue antibiotic, incentive spirometry, frequent nebs, ask for pulmonary consult to see if any other treatment modality is indicated Diabetes continue sliding scale insulin, Amaryl, resume metformin Hypertension PMR History of breast CA Anxiety depression Stress fracture left femur status post IM nailing Mild hyponatremia Chronic steroid use, steroid slightly increased was slowly weaned back to baseline  LOS: 1 day   Danay Mckellar D 09/11/2011, 8:26 AM

## 2011-09-11 NOTE — Progress Notes (Signed)
Pharmacy - Interaction between diltiazem and simvastatin  Pt on both diltiazem and simvastatin here and PTA. The dose of simvastatin should not exceed 10mg /day in pts on concomitant diltiazem due to increased risk of rhabdomyolysis.  Simvastatin has been changed to equivalent dose of rosuvastatin in hospital. Please consider changing to a different statin at d/c if able or decreasing simvastatin dose to 10mg .  Thanks, Christoper Fabian, PharmD, BCPS 09/11/11   1150

## 2011-09-12 ENCOUNTER — Inpatient Hospital Stay (HOSPITAL_COMMUNITY): Payer: Medicare Other

## 2011-09-12 DIAGNOSIS — E871 Hypo-osmolality and hyponatremia: Secondary | ICD-10-CM | POA: Diagnosis present

## 2011-09-12 MED ORDER — PREDNISONE 2.5 MG PO TABS: 2.5000 mg | ORAL_TABLET | ORAL | Status: AC

## 2011-09-12 MED ORDER — PREDNISONE 2.5 MG PO TABS
2.5000 mg | ORAL_TABLET | Freq: Two times a day (BID) | ORAL | Status: DC
  Filled 2011-09-12 (×2): qty 1

## 2011-09-12 MED ORDER — PREDNISONE 5 MG PO TABS
5.0000 mg | ORAL_TABLET | Freq: Two times a day (BID) | ORAL | Status: DC
  Filled 2011-09-12 (×2): qty 1

## 2011-09-12 MED ORDER — ALBUTEROL SULFATE HFA 108 (90 BASE) MCG/ACT IN AERS: 2.0000 | INHALATION_SPRAY | Freq: Four times a day (QID) | RESPIRATORY_TRACT | Status: DC

## 2011-09-12 MED ORDER — ALBUTEROL SULFATE HFA 108 (90 BASE) MCG/ACT IN AERS
2.0000 | INHALATION_SPRAY | Freq: Four times a day (QID) | RESPIRATORY_TRACT | Status: DC
  Administered 2011-09-12: 2 via RESPIRATORY_TRACT
  Filled 2011-09-12 (×2): qty 6.7

## 2011-09-12 NOTE — Progress Notes (Signed)
Patient discharge teaching given, including activity, diet, follow-up appoints, and medications. Patient verbalized understanding of all discharge instructions. IV access was d/c'd. Vitals are stable. Skin is intac. Pt to be escorted out by NT, to be driven home by family.

## 2011-09-12 NOTE — Progress Notes (Signed)
Subjective: Patient doing well, without fever or chills without copious sputum production, O2 sats 93% on room air. Pulmonary critical care input appreciated.  Objective: Vital signs in last 24 hours: Temp:  [97.9 F (36.6 C)-98.1 F (36.7 C)] 97.9 F (36.6 C) (12/21 0547) Pulse Rate:  [64-81] 64  (12/21 0547) Resp:  [18-20] 19  (12/21 0547) BP: (110-133)/(65-79) 114/65 mmHg (12/21 0547) SpO2:  [92 %-97 %] 93 % (12/21 0808) Weight change:  Last BM Date: 09/11/11  Intake/Output from previous day: 12/20 0701 - 12/21 0700 In: 720 [P.O.:720] Out: -  Intake/Output this shift:    General appearance: alert, cooperative and no distress Resp: bibasilar rhonchi, minimal improvement with deep cough Cardio: regular rate and rhythm, S1, S2 normal, no murmur, click, rub or gallop Extremities: extremities normal, atraumatic, no cyanosis or edema  Lab Results:  [facility] 09/10/11 1918 09/10/11 1654  WBC 12.8* 11.1*  HGB 12.1 12.2  HCT 35.8* 35.8*  PLT 300 PLATELET CLUMPS NOTED ON SMEAR, COUNT APPEARS ADEQUATE   BMET  Basename 09/10/11 1918 09/10/11 1654  NA -- 130*  K -- 4.4  CL -- 95*  CO2 -- 21  GLUCOSE -- 86  BUN -- 10  CREATININE 0.63 0.63  CALCIUM -- 10.1    Studies/Results: Ct Chest W Contrast  09/10/2011  *RADIOLOGY REPORT*  Clinical Data: Short of breath.  Hypoxia  CT CHEST WITH CONTRAST  Technique:  Multidetector CT imaging of the chest was performed following the standard protocol during bolus administration of intravenous contrast.  Contrast: OMNIPAQUE IOHEXOL 300 MG/ML IV SOLN  Comparison: Chest x-ray 07/07/2011  Findings: Radiology order was for CT chest with contrast.  This was performed.  A CTA was not performed.  However there is good opacification of the pulmonary arteries.  This is not as sensitive as CTA would be for pulmonary emboli.  Allowing for this, no large pulmonary emboli are identified.  There is partial collapse of the left lower lobe.  No pleural  effusion.  Right lung is clear.  No underlying mass lesion is identified.  IMPRESSION: Left lower lobe collapse.  No large pulmonary embolism is identified.  CTA technique was not utilized on this study.  Critical Value/emergent results were called by telephone at the time of interpretation on 09/10/2011  at 1855 hours  to  Dr. Donette Larry, who verbally acknowledged these results.  Original Report Authenticated By: Camelia Phenes, M.D.    Medications:  Prior to Admission:  Prescriptions prior to admission  Medication Sig Dispense Refill  . acetaminophen (TYLENOL) 500 MG tablet Take 500 mg by mouth every 6 (six) hours as needed. For pain       . aspirin EC 81 MG tablet Take 81 mg by mouth daily.        . benazepril-hydrochlorthiazide (LOTENSIN HCT) 20-12.5 MG per tablet Take 1 tablet by mouth daily.        . Calcium Citrate (CITRACAL PO) Take 1,000 mg by mouth daily.        . celecoxib (CELEBREX) 200 MG capsule Take 200 mg by mouth daily.        . clorazepate (TRANXENE) 3.75 MG tablet Take 3.75 mg by mouth 2 (two) times daily as needed. For anxiety       . diltiazem (DILACOR XR) 240 MG 24 hr capsule Take 240 mg by mouth daily.        . DULoxetine (CYMBALTA) 30 MG capsule Take 30 mg by mouth daily.        Marland Kitchen  esomeprazole (NEXIUM) 40 MG capsule Take 40 mg by mouth daily.        Marland Kitchen ezetimibe (ZETIA) 10 MG tablet Take 10 mg by mouth daily.        Marland Kitchen glimepiride (AMARYL) 1 MG tablet Take 1 mg by mouth daily.        . metFORMIN (GLUCOPHAGE) 500 MG tablet Take 500 mg by mouth See admin instructions. Take two tablets in the morning and one tablet at night       . Multiple Vitamins-Minerals (MULTIVITAMINS THER. W/MINERALS) TABS Take 1 tablet by mouth daily.        . simvastatin (ZOCOR) 40 MG tablet Take 40 mg by mouth at bedtime.         Scheduled:   . albuterol  2.5 mg Nebulization Q6H  . aspirin  81 mg Oral Daily  . benazepril  20 mg Oral Daily  . diltiazem  240 mg Oral Daily  . DULoxetine  30 mg Oral  Daily  . ezetimibe  10 mg Oral Daily  . glimepiride  2 mg Oral Q breakfast  . heparin  5,000 Units Subcutaneous Q8H  . insulin aspart  0-9 Units Subcutaneous TID WC  . levofloxacin (LEVAQUIN) IV  500 mg Intravenous Q24H  . pantoprazole  40 mg Oral Daily  . predniSONE  10 mg Oral BID WC  . rosuvastatin  5 mg Oral q1800  . sodium chloride  3 mL Intravenous Q12H  . DISCONTD: predniSONE  20 mg Oral BID WC  . DISCONTD: simvastatin  20 mg Oral q1800   Continuous:  JXB:JYNWGN chloride, sodium chloride, traMADol  Assessment/Plan: Hypoxia improved secondary to left lower lobe atelectasis, we would change antibiotics to p.o., continue total a seven-day course, check followup x-ray. Continue frequent nebs. Cultures have been reviewed, influenza PCR is negative. More than likely patient developed left lower lobe atelectasis with subsequent collapse from mucous plugging as CT did not reveal any other pathology. Currently his sats are stable, no indication for bronchoscopy at this time. Pending followup x-ray plan for discharge to home.  LOS: 2 days   Shakala Marlatt D 09/12/2011, 8:32 AM

## 2011-09-12 NOTE — Progress Notes (Addendum)
Patient: Cathy Taylor DOB: [birth year] Date of Admission: 09/10/2011            Pulmonary FU Date of Consult: 09/12/2011 MD requesting consult:  Reason for consult:   HPI -  77 yo female with hx hypertension, DM, polymyalgia rheumatica, GERD who was recently treated as an outpatient with bronchitis. She was treated with a Z-Pak. However her symptoms continued to worsen and she presented to her primary care office on 12/19 with continued complaints of cough, congestion, shortness of breath and chest pain. In the office she was hypoxic despite nebulizer treatment and she was sent to [facility] for direct admission by Dr. Nehemiah Settle. On December 20 CT scan showed left lower lobe collapse.  Allergies:  No Known Allergies    ROS: Patient complains of chronic back pain due to arthritis, cough with purulent sputum, shortness of breath. States chest pain has resolved.. Denies fevers, chills, nausea, vomiting, headache, hemoptysis. No recent sick contacts. All other systems were reviewed and were negative  Filed Vitals:   09/11/11 1452 09/11/11 2213 09/12/11 0547 09/12/11 0808  BP:  127/67 114/65   Pulse:  76 64   Temp:  98.1 F (36.7 C) 97.9 F (36.6 C)   TempSrc:  Oral Oral   Resp:  20 19   Height:      Weight:      SpO2: 95% 95% 97% 93%    CBC    Component Value Date/Time   WBC 12.8* 09/10/2011 1918   RBC 4.38 09/10/2011 1918   HGB 12.1 09/10/2011 1918   HCT 35.8* 09/10/2011 1918   PLT 300 09/10/2011 1918   MCV 81.7 09/10/2011 1918   MCH 27.6 09/10/2011 1918   MCHC 33.8 09/10/2011 1918   RDW 14.2 09/10/2011 1918   LYMPHSABS 2.1 09/10/2011 1654   MONOABS 0.9 09/10/2011 1654   EOSABS 0.1 09/10/2011 1654   BASOSABS 0.0 09/10/2011 1654   BMET    Component Value Date/Time   NA 130* 09/10/2011 1654   K 4.4 09/10/2011 1654   CL 95* 09/10/2011 1654   CO2 21 09/10/2011 1654   GLUCOSE 86 09/10/2011 1654   BUN 10 09/10/2011 1654   CREATININE 0.63 09/10/2011 1918   CALCIUM 10.1  09/10/2011 1654   GFRNONAA 85* 09/10/2011 1918   GFRAA >90 09/10/2011 1918   EXAM: General appearance: NAD, conversant  Eyes: anicteric sclerae, moist conjunctivae; no lid-lag; PERRLA HENT: Atraumatic; oropharynx clear with moist mucous membranes and no mucosal ulcerations; normal hard and soft palate Neck: Trachea midline; FROM, supple, no thyromegaly or lymphadenopathy Lungs: normal respiratory effort and no intercostal retractions, right side clear, left side decerased , bibasal coarse crackles CV: RRR, no MRGs  Abdomen: Soft, non-tender; no masses or HSM Extremities: No peripheral edema or extremity lymphadenopathy Skin: Normal temperature, turgor and texture; no rash, ulcers or subcutaneous nodules Psych: Appropriate affect, alert and oriented to person, place and time  IMPRESSION/ PLAN:  LLL atx/ collapse -- no underlying mass/ lesion seen on CT.  Likely d/t mucous plugging in setting purulent bronchitis v ?CAP. Failed outpt rx. Pt in no distress, looks good clinically.  PLAN -  Aggressive pulm hygiene with vibravest, flutter valve, IS BD Cont  levaquin x 10 ds Patient's atelectasis is localized to a single area, the airway appears with ?of bronchiectasis on the CT.  The patient likely has high sputum production and needs more aggressive vibravest/flutter valve/IS.  Once acute infection is addressed would repeat imaging and if atelectasis remains  will need FOB to evaluate airway.  But in the meantime continue treatment with Abx.  Does not necessarily need steroids at this time - ok to cont low-dose taper back to chronic dose  PCCM to sign off - FU appt made on 1/16  [facility] V., NP 09/12/2011  8:29 AM

## 2011-09-12 NOTE — Discharge Summary (Signed)
Physician Discharge Summary  Patient ID: Cathy Taylor MRN: [id number] DOB/AGE: 05/04/1933 77 y.o.  Admit date: 09/10/2011 Discharge date: 09/12/2011  Admission Diagnoses:  Discharge Diagnoses:  Principal Problem:  *Collapsed lung Active Problems:  Dyspnea  Hyponatremia   Discharged Condition: good  Hospital Course: Patient was directly admitted to the hospital for hypoxia and abnormal x-ray showing atelectasis versus Infiltrate in the left lower lobe. CT scan confirmed left lower lobe collapse, patient was treated with empiric antibiotics oxygen and bronchodilator. Pulmonary critical care medicine was consult, no additional intervention was indicated. Patient's followup O2 sats were normal, followup x-ray shows significant improvement in collapse, patient is clinically stable without shortness of breath, stable without oxygen. She will continue frequent nebulizer treatments. She will have followup in one to 2 weeks his primary M.D. Consults: pulmonary/intensive care  Significant Diagnostic Studies: CT of the chest showed left lower lobe collapse, no endobronchial lesion no effusion.  Treatments: antibiotics: Levaquin and respiratory therapy: O2, albuterol/atropine nebulizer and chest PT  Discharge Exam: Blood pressure 123/73, pulse 64, temperature 97.9 F (36.6 C), temperature source Oral, resp. rate 19, height 5\' 4"  (1.626 m), weight 77 kg (169 lb 12.1 oz), SpO2 93.00%. General appearance: alert and cooperative Resp: bibasilar rhonchi, some improvement with the cough Cardio: regular rate and rhythm, S1, S2 normal, no murmur, click, rub or gallop  Disposition: Home-Health Care Svc  Discharge Orders    Future Appointments: Provider: Department: Dept Phone: Center:   10/08/2011 11:15 AM Comer Locket. Vassie Loll, MD [facility] [phone] None     Medication List  As of 09/12/2011  2:22 PM   START taking these medications         predniSONE 2.5 MG tablet   Commonly known as:  DELTASONE   Take 1 tablet (2.5 mg total) by mouth 1 day or 1 dose.         CONTINUE taking these medications         acetaminophen 500 MG tablet   Commonly known as: TYLENOL      aspirin EC 81 MG tablet      benazepril-hydrochlorthiazide 20-12.5 MG per tablet   Commonly known as: LOTENSIN HCT      celecoxib 200 MG capsule   Commonly known as: CELEBREX      CITRACAL PO      clorazepate 3.75 MG tablet   Commonly known as: TRANXENE      diltiazem 240 MG 24 hr capsule   Commonly known as: DILACOR XR      DULoxetine 30 MG capsule   Commonly known as: CYMBALTA      esomeprazole 40 MG capsule   Commonly known as: NEXIUM      ezetimibe 10 MG tablet   Commonly known as: ZETIA      glimepiride 1 MG tablet   Commonly known as: AMARYL      metFORMIN 500 MG tablet   Commonly known as: GLUCOPHAGE      multivitamins ther. w/minerals Tabs      simvastatin 40 MG tablet   Commonly known as: ZOCOR          Where to get your medications       Information on where to get these meds is not yet available. Ask your nurse or doctor.         predniSONE 2.5 MG tablet           Follow-up Information    Follow up with Oretha Milch., MD on 10/08/2011. (11:15 AM)  Contact information:   [facility] [address] [place] [address] [phone]       Follow up with Arleene Settle D in 2 weeks.   Contact information:   [address], [address] [employer] [address] [place] [address] [phone]          Signed: Katy Apo 09/12/2011, 2:22 PM

## 2011-09-17 LAB — CULTURE, BLOOD (ROUTINE X 2)
Culture  Setup Time: 201212200136
Culture: NO GROWTH

## 2011-10-08 ENCOUNTER — Ambulatory Visit (INDEPENDENT_AMBULATORY_CARE_PROVIDER_SITE_OTHER)
Admission: RE | Admit: 2011-10-08 | Discharge: 2011-10-08 | Disposition: A | Discharge status: Home / Self Care | Payer: Medicare Other | Source: Ambulatory Visit | Attending: Pulmonary Disease | Admitting: Pulmonary Disease

## 2011-10-08 ENCOUNTER — Encounter: Payer: Self-pay | Admitting: Pulmonary Disease

## 2011-10-08 ENCOUNTER — Ambulatory Visit (INDEPENDENT_AMBULATORY_CARE_PROVIDER_SITE_OTHER): Payer: Medicare Other | Admitting: Pulmonary Disease

## 2011-10-08 DIAGNOSIS — J9819 Other pulmonary collapse: Secondary | ICD-10-CM

## 2011-10-08 DIAGNOSIS — J479 Bronchiectasis, uncomplicated: Secondary | ICD-10-CM

## 2011-10-08 NOTE — Patient Instructions (Signed)
Chest xray today - we will call you with results You have a condition called BRONCHIECTASIS - this can predispose you to attacks of bronchitis Call for antibiotic if  fever, increased yellow-green sputum or wheezing

## 2011-10-08 NOTE — Progress Notes (Signed)
  Subjective:    Patient ID: Cathy Taylor, female    DOB: 1932/10/19, 78 y.o.   MRN: [id number]  HPI PCP - [facility] - Donald Siva - Wyline Mood  78 yo female with hx hypertension, DM, polymyalgia rheumatica (on prednisone), GERD who was recently treated as an outpatient with bronchitis. She was treated with a Z-Pak. However her symptoms continued to worsen and she presented to her primary care office on 12/19 with continued complaints of cough, congestion, shortness of breath and chest pain. In the office she was hypoxic despite nebulizer treatment and she was sent to [facility] for direct admission by Dr. Nehemiah Settle. On December 20 CT scan showed left lower lobe collapse. LLL atx/ collapse -- no underlying mass/ lesion seen on CT. Likely d/t mucous plugging in setting purulent bronchitis v ?CAP. Failed outpt rx. Pt in no distress, looks good clinically.  Took t levaquin x 10 ds  CXR 12/21 showed improved aeraation of LLL    Review of Systems Patient denies significant dyspnea,cough, hemoptysis,  chest pain, palpitations, pedal edema, orthopnea, paroxysmal nocturnal dyspnea, lightheadedness, nausea, vomiting, abdominal or  leg pains      Objective:   Physical Exam  Gen. Pleasant, well-nourished, in no distress ENT - no lesions, no post nasal drip Neck: No JVD, no thyromegaly, no carotid bruits Lungs: no use of accessory muscles, no dullness to percussion, LLL rales  Cardiovascular: Rhythm regular, heart sounds  normal, no murmurs or gallops, no peripheral edema Musculoskeletal: No deformities, no cyanosis or clubbing        Assessment & Plan:

## 2011-10-10 DIAGNOSIS — J479 Bronchiectasis, uncomplicated: Secondary | ICD-10-CM | POA: Insufficient documentation

## 2011-10-10 NOTE — Assessment & Plan Note (Signed)
this can predispose you to attacks of bronchitis Call for antibiotic if  fever, increased yellow-green sputum or wheezing

## 2011-10-10 NOTE — Assessment & Plan Note (Signed)
Resolved on CXR 

## 2012-01-02 ENCOUNTER — Other Ambulatory Visit: Payer: Self-pay | Admitting: Orthopedic Surgery

## 2012-01-02 ENCOUNTER — Ambulatory Visit
Admission: RE | Admit: 2012-01-02 | Discharge: 2012-01-02 | Disposition: A | Discharge status: Home / Self Care | Payer: Medicare Other | Source: Ambulatory Visit | Attending: Orthopedic Surgery | Admitting: Orthopedic Surgery

## 2012-01-02 DIAGNOSIS — M25512 Pain in left shoulder: Secondary | ICD-10-CM

## 2012-01-02 NOTE — Pre-Procedure Instructions (Addendum)
20 Cathy Taylor  01/02/2012   Your procedure is scheduled on: 01/06/12  Report to [facility] at 1030 AM.  Call this number if you have problems the morning of surgery: [phone]   Remember:   Do not eat food:After Midnight.  May have clear liquids: up to 4 Hours before arrival. 630 am  Clear liquids include soda, tea, black coffee, apple or grape juice, broth.  Take these medicines the morning of surgery with A SIP OF WATER: albuterol inhaler,,,diltiazem, , nexium, predisone, tramodol STOP aspirin,celebrex, voltaren, multi vit, if not already stopped   Do not wear jewelry, make-up or nail polish.  Do not wear lotions, powders, or perfumes. You may wear deodorant.  Do not shave 48 hours prior to surgery.  Do not bring valuables to the hospital.  Contacts, dentures or bridgework may not be worn into surgery.  Leave suitcase in the car. After surgery it may be brought to your room.  For patients admitted to the hospital, checkout time is 11:00 AM the day of discharge.   Patients discharged the day of surgery will not be allowed to drive home.  Name and phone number of your driver:harold [phone] spouse   Special Instructions: Incentive Spirometry - Practice and bring it with you on the day of surgery. and CHG Shower Use Special Wash: 1/2 bottle night before surgery and 1/2 bottle morning of surgery.   Please read over the following fact sheets that you were given: Pain Booklet, Coughing and Deep Breathing, Blood Transfusion Information, Total Joint Packet, MRSA Information and Surgical Site Infection Prevention

## 2012-01-05 ENCOUNTER — Encounter (HOSPITAL_COMMUNITY): Payer: Self-pay

## 2012-01-05 ENCOUNTER — Encounter (HOSPITAL_COMMUNITY)
Admission: RE | Admit: 2012-01-05 | Discharge: 2012-01-05 | Disposition: A | Discharge status: Home / Self Care | Payer: Medicare Other | Source: Ambulatory Visit | Attending: Orthopedic Surgery | Admitting: Orthopedic Surgery

## 2012-01-05 ENCOUNTER — Encounter (HOSPITAL_COMMUNITY): Payer: Self-pay | Admitting: Pharmacy Technician

## 2012-01-05 HISTORY — DX: Unspecified nephritic syndrome with unspecified morphologic changes: N05.9

## 2012-01-05 LAB — CBC
HCT: 38.9 % (ref 36.0–46.0)
Hemoglobin: 12.9 g/dL (ref 12.0–15.0)
MCH: 27.5 pg (ref 26.0–34.0)
MCHC: 33.2 g/dL (ref 30.0–36.0)

## 2012-01-05 LAB — BASIC METABOLIC PANEL
BUN: 13 mg/dL (ref 6–23)
CO2: 30 mEq/L (ref 19–32)
Chloride: 97 mEq/L (ref 96–112)
Glucose, Bld: 130 mg/dL — ABNORMAL HIGH (ref 70–99)
Potassium: 4.3 mEq/L (ref 3.5–5.1)

## 2012-01-05 LAB — URINALYSIS, ROUTINE W REFLEX MICROSCOPIC
Ketones, ur: NEGATIVE mg/dL
Nitrite: NEGATIVE
Specific Gravity, Urine: 1.008 (ref 1.005–1.030)
pH: 6.5 (ref 5.0–8.0)

## 2012-01-05 LAB — TYPE AND SCREEN

## 2012-01-05 LAB — URINE MICROSCOPIC-ADD ON

## 2012-01-05 LAB — SURGICAL PCR SCREEN: Staphylococcus aureus: NEGATIVE

## 2012-01-05 MED ORDER — CEFAZOLIN SODIUM 1-5 GM-% IV SOLN
1.0000 g | INTRAVENOUS | Status: AC
  Administered 2012-01-06: 1 g via INTRAVENOUS
  Filled 2012-01-05: qty 50

## 2012-01-05 NOTE — Progress Notes (Signed)
tarra req'd notes, tests cardiac from dr polite

## 2012-01-06 ENCOUNTER — Ambulatory Visit (HOSPITAL_COMMUNITY): Payer: Medicare Other

## 2012-01-06 ENCOUNTER — Inpatient Hospital Stay (HOSPITAL_COMMUNITY)
Admission: RE | Admit: 2012-01-06 | Discharge: 2012-01-09 | DRG: 484 | Disposition: A | Discharge status: Home Health | Payer: Medicare Other | Source: Ambulatory Visit | Attending: Orthopedic Surgery | Admitting: Orthopedic Surgery

## 2012-01-06 ENCOUNTER — Ambulatory Visit (HOSPITAL_COMMUNITY): Payer: Medicare Other | Admitting: Certified Registered"

## 2012-01-06 ENCOUNTER — Encounter (HOSPITAL_COMMUNITY)
Admission: RE | Disposition: A | Discharge status: Home Health | Payer: Self-pay | Source: Ambulatory Visit | Attending: Orthopedic Surgery

## 2012-01-06 ENCOUNTER — Encounter (HOSPITAL_COMMUNITY): Payer: Self-pay | Admitting: Certified Registered"

## 2012-01-06 ENCOUNTER — Encounter (HOSPITAL_COMMUNITY): Payer: Self-pay | Admitting: Orthopedic Surgery

## 2012-01-06 ENCOUNTER — Encounter (HOSPITAL_COMMUNITY): Payer: Self-pay | Admitting: *Deleted

## 2012-01-06 DIAGNOSIS — I10 Essential (primary) hypertension: Secondary | ICD-10-CM | POA: Diagnosis present

## 2012-01-06 DIAGNOSIS — Z01811 Encounter for preprocedural respiratory examination: Secondary | ICD-10-CM

## 2012-01-06 DIAGNOSIS — M353 Polymyalgia rheumatica: Secondary | ICD-10-CM | POA: Diagnosis present

## 2012-01-06 DIAGNOSIS — E7800 Pure hypercholesterolemia, unspecified: Secondary | ICD-10-CM | POA: Diagnosis present

## 2012-01-06 DIAGNOSIS — J479 Bronchiectasis, uncomplicated: Secondary | ICD-10-CM | POA: Diagnosis present

## 2012-01-06 DIAGNOSIS — M19019 Primary osteoarthritis, unspecified shoulder: Principal | ICD-10-CM | POA: Diagnosis present

## 2012-01-06 DIAGNOSIS — K219 Gastro-esophageal reflux disease without esophagitis: Secondary | ICD-10-CM | POA: Diagnosis present

## 2012-01-06 DIAGNOSIS — Z79899 Other long term (current) drug therapy: Secondary | ICD-10-CM

## 2012-01-06 DIAGNOSIS — K429 Umbilical hernia without obstruction or gangrene: Secondary | ICD-10-CM

## 2012-01-06 DIAGNOSIS — F341 Dysthymic disorder: Secondary | ICD-10-CM | POA: Diagnosis present

## 2012-01-06 DIAGNOSIS — Z853 Personal history of malignant neoplasm of breast: Secondary | ICD-10-CM

## 2012-01-06 DIAGNOSIS — Z01812 Encounter for preprocedural laboratory examination: Secondary | ICD-10-CM

## 2012-01-06 DIAGNOSIS — IMO0002 Reserved for concepts with insufficient information to code with codable children: Secondary | ICD-10-CM

## 2012-01-06 DIAGNOSIS — Z7982 Long term (current) use of aspirin: Secondary | ICD-10-CM

## 2012-01-06 DIAGNOSIS — E119 Type 2 diabetes mellitus without complications: Secondary | ICD-10-CM | POA: Diagnosis present

## 2012-01-06 HISTORY — PX: TOTAL SHOULDER ARTHROPLASTY: SHX126

## 2012-01-06 HISTORY — DX: Type 2 diabetes mellitus without complications: E11.9

## 2012-01-06 HISTORY — DX: Umbilical hernia without obstruction or gangrene: K42.9

## 2012-01-06 HISTORY — DX: Malignant neoplasm of unspecified site of unspecified female breast: C50.919

## 2012-01-06 LAB — CBC
MCH: 27.1 pg (ref 26.0–34.0)
MCHC: 33.1 g/dL (ref 30.0–36.0)
MCV: 81.8 fL (ref 78.0–100.0)
Platelets: 240 10*3/uL (ref 150–400)
RBC: 4.24 MIL/uL (ref 3.87–5.11)

## 2012-01-06 LAB — GLUCOSE, CAPILLARY
Glucose-Capillary: 108 mg/dL — ABNORMAL HIGH (ref 70–99)
Glucose-Capillary: 204 mg/dL — ABNORMAL HIGH (ref 70–99)

## 2012-01-06 LAB — CARDIAC PANEL(CRET KIN+CKTOT+MB+TROPI): Relative Index: 2.6 — ABNORMAL HIGH (ref 0.0–2.5)

## 2012-01-06 SURGERY — ARTHROPLASTY, SHOULDER, TOTAL
Anesthesia: General | Site: Shoulder | Laterality: Left | Wound class: Clean

## 2012-01-06 MED ORDER — BENAZEPRIL HCL 20 MG PO TABS
20.0000 mg | ORAL_TABLET | Freq: Every day | ORAL | Status: DC
  Administered 2012-01-06 – 2012-01-09 (×4): 20 mg via ORAL
  Filled 2012-01-06 (×4): qty 1

## 2012-01-06 MED ORDER — CLOTRIMAZOLE 1 % EX CREA
TOPICAL_CREAM | Freq: Two times a day (BID) | CUTANEOUS | Status: DC
  Administered 2012-01-09: 10:00:00 via TOPICAL
  Filled 2012-01-06: qty 15

## 2012-01-06 MED ORDER — MIDAZOLAM HCL 5 MG/5ML IJ SOLN
INTRAMUSCULAR | Status: DC | PRN
  Administered 2012-01-06: 2 mg via INTRAVENOUS

## 2012-01-06 MED ORDER — LIDOCAINE HCL 4 % MT SOLN
OROMUCOSAL | Status: DC | PRN
  Administered 2012-01-06: 4 mL via TOPICAL

## 2012-01-06 MED ORDER — ALUM & MAG HYDROXIDE-SIMETH 200-200-20 MG/5ML PO SUSP: 30.0000 mL | ORAL | Status: DC | PRN

## 2012-01-06 MED ORDER — OXYCODONE-ACETAMINOPHEN 5-325 MG PO TABS
1.0000 | ORAL_TABLET | ORAL | Status: DC | PRN
  Administered 2012-01-06 – 2012-01-08 (×4): 1 via ORAL
  Filled 2012-01-06: qty 2
  Filled 2012-01-06 (×2): qty 1
  Filled 2012-01-06: qty 2
  Filled 2012-01-06: qty 1

## 2012-01-06 MED ORDER — CALCIUM CITRATE-VITAMIN D 315-200 MG-UNIT PO TABS: 1.0000 | ORAL_TABLET | Freq: Every day | ORAL | Status: DC

## 2012-01-06 MED ORDER — METFORMIN HCL 500 MG PO TABS
500.0000 mg | ORAL_TABLET | Freq: Every day | ORAL | Status: DC
  Administered 2012-01-06 – 2012-01-08 (×3): 500 mg via ORAL
  Filled 2012-01-06 (×4): qty 1

## 2012-01-06 MED ORDER — ALBUTEROL SULFATE HFA 108 (90 BASE) MCG/ACT IN AERS
2.0000 | INHALATION_SPRAY | Freq: Four times a day (QID) | RESPIRATORY_TRACT | Status: DC | PRN
  Filled 2012-01-06: qty 6.7

## 2012-01-06 MED ORDER — BENAZEPRIL-HYDROCHLOROTHIAZIDE 20-12.5 MG PO TABS: 1.0000 | ORAL_TABLET | Freq: Every day | ORAL | Status: DC

## 2012-01-06 MED ORDER — ZOLPIDEM TARTRATE 5 MG PO TABS: 5.0000 mg | ORAL_TABLET | Freq: Every evening | ORAL | Status: DC | PRN

## 2012-01-06 MED ORDER — SENNA 8.6 MG PO TABS
1.0000 | ORAL_TABLET | Freq: Two times a day (BID) | ORAL | Status: DC
  Administered 2012-01-07 – 2012-01-09 (×5): 8.6 mg via ORAL
  Filled 2012-01-06 (×7): qty 1

## 2012-01-06 MED ORDER — DROPERIDOL 2.5 MG/ML IJ SOLN
INTRAMUSCULAR | Status: DC | PRN
  Administered 2012-01-06: 0.625 mg via INTRAVENOUS

## 2012-01-06 MED ORDER — ONDANSETRON HCL 4 MG/2ML IJ SOLN
4.0000 mg | Freq: Four times a day (QID) | INTRAMUSCULAR | Status: DC | PRN
  Administered 2012-01-08: 4 mg via INTRAVENOUS
  Filled 2012-01-06: qty 2

## 2012-01-06 MED ORDER — METHOCARBAMOL 100 MG/ML IJ SOLN
500.0000 mg | Freq: Four times a day (QID) | INTRAVENOUS | Status: DC | PRN
  Filled 2012-01-06: qty 5

## 2012-01-06 MED ORDER — LIDOCAINE HCL (CARDIAC) 20 MG/ML IV SOLN
INTRAVENOUS | Status: DC | PRN
  Administered 2012-01-06: 40 mg via INTRAVENOUS

## 2012-01-06 MED ORDER — DEXAMETHASONE SODIUM PHOSPHATE 4 MG/ML IJ SOLN
INTRAMUSCULAR | Status: DC | PRN
  Administered 2012-01-06: 4 mg via INTRAVENOUS

## 2012-01-06 MED ORDER — LACTATED RINGERS IV SOLN
INTRAVENOUS | Status: DC
  Administered 2012-01-06: 13:00:00 via INTRAVENOUS

## 2012-01-06 MED ORDER — OXYCODONE HCL 5 MG PO TABS: 5.0000 mg | ORAL_TABLET | ORAL | Status: DC | PRN

## 2012-01-06 MED ORDER — PHENOL 1.4 % MT LIQD
1.0000 | OROMUCOSAL | Status: DC | PRN
  Filled 2012-01-06: qty 177

## 2012-01-06 MED ORDER — METHOCARBAMOL 500 MG PO TABS
500.0000 mg | ORAL_TABLET | Freq: Four times a day (QID) | ORAL | Status: DC | PRN
  Filled 2012-01-06: qty 1

## 2012-01-06 MED ORDER — DOCUSATE SODIUM 100 MG PO CAPS
100.0000 mg | ORAL_CAPSULE | Freq: Two times a day (BID) | ORAL | Status: DC
  Administered 2012-01-07 – 2012-01-09 (×4): 100 mg via ORAL
  Filled 2012-01-06 (×7): qty 1

## 2012-01-06 MED ORDER — LACTATED RINGERS IV SOLN
INTRAVENOUS | Status: DC | PRN
  Administered 2012-01-06 (×3): via INTRAVENOUS

## 2012-01-06 MED ORDER — POTASSIUM CHLORIDE IN NACL 20-0.45 MEQ/L-% IV SOLN
INTRAVENOUS | Status: DC
  Administered 2012-01-06: 21:00:00 via INTRAVENOUS
  Administered 2012-01-07: 75 mL/h via INTRAVENOUS
  Administered 2012-01-08: 01:00:00 via INTRAVENOUS
  Filled 2012-01-06 (×6): qty 1000

## 2012-01-06 MED ORDER — GLIMEPIRIDE 1 MG PO TABS
1.0000 mg | ORAL_TABLET | Freq: Every day | ORAL | Status: DC
  Administered 2012-01-06 – 2012-01-09 (×4): 1 mg via ORAL
  Filled 2012-01-06 (×4): qty 1

## 2012-01-06 MED ORDER — ACETAMINOPHEN 325 MG PO TABS
650.0000 mg | ORAL_TABLET | Freq: Four times a day (QID) | ORAL | Status: DC | PRN
  Administered 2012-01-07 – 2012-01-09 (×2): 650 mg via ORAL
  Filled 2012-01-06 (×2): qty 2

## 2012-01-06 MED ORDER — METOCLOPRAMIDE HCL 5 MG PO TABS
5.0000 mg | ORAL_TABLET | Freq: Three times a day (TID) | ORAL | Status: DC | PRN
Start: 2012-01-06 — End: 2012-01-09
  Filled 2012-01-06: qty 2

## 2012-01-06 MED ORDER — HYDROMORPHONE HCL PF 1 MG/ML IJ SOLN: 0.5000 mg | INTRAMUSCULAR | Status: DC | PRN

## 2012-01-06 MED ORDER — CALCIUM CARBONATE-VITAMIN D 500-200 MG-UNIT PO TABS
1.0000 | ORAL_TABLET | Freq: Every day | ORAL | Status: DC
  Administered 2012-01-06 – 2012-01-09 (×4): 1 via ORAL
  Filled 2012-01-06 (×4): qty 1

## 2012-01-06 MED ORDER — SUFENTANIL CITRATE 50 MCG/ML IV SOLN
INTRAVENOUS | Status: DC | PRN
  Administered 2012-01-06 (×2): 5 ug via INTRAVENOUS
  Administered 2012-01-06: 10 ug via INTRAVENOUS
  Administered 2012-01-06: 5 ug via INTRAVENOUS

## 2012-01-06 MED ORDER — NEOSTIGMINE METHYLSULFATE 1 MG/ML IJ SOLN
INTRAMUSCULAR | Status: DC | PRN
  Administered 2012-01-06: 3 mg via INTRAVENOUS

## 2012-01-06 MED ORDER — TRAMADOL HCL 50 MG PO TABS
50.0000 mg | ORAL_TABLET | Freq: Every day | ORAL | Status: DC
  Administered 2012-01-06 – 2012-01-07 (×2): 50 mg via ORAL
  Administered 2012-01-08: 100 mg via ORAL
  Filled 2012-01-06 (×3): qty 2

## 2012-01-06 MED ORDER — PANTOPRAZOLE SODIUM 40 MG PO TBEC
40.0000 mg | DELAYED_RELEASE_TABLET | Freq: Every day | ORAL | Status: DC
  Administered 2012-01-07 – 2012-01-09 (×3): 40 mg via ORAL
  Filled 2012-01-06 (×4): qty 1

## 2012-01-06 MED ORDER — DIPHENHYDRAMINE HCL 12.5 MG/5ML PO ELIX
12.5000 mg | ORAL_SOLUTION | ORAL | Status: DC | PRN
  Filled 2012-01-06: qty 10

## 2012-01-06 MED ORDER — GLYCOPYRROLATE 0.2 MG/ML IJ SOLN
INTRAMUSCULAR | Status: DC | PRN
  Administered 2012-01-06: 0.4 mg via INTRAVENOUS

## 2012-01-06 MED ORDER — ONDANSETRON HCL 4 MG/2ML IJ SOLN: 4.0000 mg | Freq: Once | INTRAMUSCULAR | Status: DC | PRN

## 2012-01-06 MED ORDER — METOCLOPRAMIDE HCL 5 MG/ML IJ SOLN
5.0000 mg | Freq: Three times a day (TID) | INTRAMUSCULAR | Status: DC | PRN
  Filled 2012-01-06: qty 2

## 2012-01-06 MED ORDER — ACETAMINOPHEN 650 MG RE SUPP: 650.0000 mg | Freq: Four times a day (QID) | RECTAL | Status: DC | PRN

## 2012-01-06 MED ORDER — HYDROCHLOROTHIAZIDE 12.5 MG PO CAPS
12.5000 mg | ORAL_CAPSULE | Freq: Every day | ORAL | Status: DC
  Administered 2012-01-06 – 2012-01-09 (×4): 12.5 mg via ORAL
  Filled 2012-01-06 (×4): qty 1

## 2012-01-06 MED ORDER — SODIUM CHLORIDE 0.9 % IR SOLN
Status: DC | PRN
  Administered 2012-01-06: 1000 mL

## 2012-01-06 MED ORDER — METFORMIN HCL 500 MG PO TABS: 500.0000 mg | ORAL_TABLET | ORAL | Status: DC

## 2012-01-06 MED ORDER — METFORMIN HCL 500 MG PO TABS
1000.0000 mg | ORAL_TABLET | Freq: Every day | ORAL | Status: DC
  Administered 2012-01-07 – 2012-01-09 (×3): 1000 mg via ORAL
  Filled 2012-01-06 (×4): qty 2

## 2012-01-06 MED ORDER — PROPOFOL 10 MG/ML IV EMUL
INTRAVENOUS | Status: DC | PRN
  Administered 2012-01-06: 100 mg via INTRAVENOUS

## 2012-01-06 MED ORDER — SIMVASTATIN 40 MG PO TABS
40.0000 mg | ORAL_TABLET | Freq: Every day | ORAL | Status: DC
  Administered 2012-01-06 – 2012-01-08 (×3): 40 mg via ORAL
  Filled 2012-01-06 (×4): qty 1

## 2012-01-06 MED ORDER — HYDROMORPHONE HCL PF 1 MG/ML IJ SOLN: 0.2500 mg | INTRAMUSCULAR | Status: DC | PRN

## 2012-01-06 MED ORDER — PREDNISONE 2.5 MG PO TABS
2.5000 mg | ORAL_TABLET | Freq: Every day | ORAL | Status: DC
  Administered 2012-01-07 – 2012-01-09 (×3): 2.5 mg via ORAL
  Filled 2012-01-06 (×4): qty 1

## 2012-01-06 MED ORDER — ASPIRIN EC 81 MG PO TBEC
81.0000 mg | DELAYED_RELEASE_TABLET | Freq: Every day | ORAL | Status: DC
  Administered 2012-01-06 – 2012-01-09 (×4): 81 mg via ORAL
  Filled 2012-01-06 (×4): qty 1

## 2012-01-06 MED ORDER — ENOXAPARIN SODIUM 40 MG/0.4ML ~~LOC~~ SOLN
40.0000 mg | SUBCUTANEOUS | Status: DC
  Administered 2012-01-07 – 2012-01-09 (×3): 40 mg via SUBCUTANEOUS
  Filled 2012-01-06 (×4): qty 0.4

## 2012-01-06 MED ORDER — EZETIMIBE 10 MG PO TABS
10.0000 mg | ORAL_TABLET | Freq: Every day | ORAL | Status: DC
  Administered 2012-01-06 – 2012-01-09 (×4): 10 mg via ORAL
  Filled 2012-01-06 (×4): qty 1

## 2012-01-06 MED ORDER — MENTHOL 3 MG MT LOZG
1.0000 | LOZENGE | OROMUCOSAL | Status: DC | PRN
  Filled 2012-01-06 (×2): qty 9

## 2012-01-06 MED ORDER — EPHEDRINE SULFATE 50 MG/ML IJ SOLN
INTRAMUSCULAR | Status: DC | PRN
  Administered 2012-01-06 (×2): 10 mg via INTRAVENOUS
  Administered 2012-01-06: 5 mg via INTRAVENOUS
  Administered 2012-01-06 (×2): 10 mg via INTRAVENOUS

## 2012-01-06 MED ORDER — CLORAZEPATE DIPOTASSIUM 7.5 MG PO TABS: 3.7500 mg | ORAL_TABLET | Freq: Two times a day (BID) | ORAL | Status: DC | PRN

## 2012-01-06 MED ORDER — DULOXETINE HCL 30 MG PO CPEP
30.0000 mg | ORAL_CAPSULE | Freq: Every day | ORAL | Status: DC
  Administered 2012-01-06 – 2012-01-09 (×4): 30 mg via ORAL
  Filled 2012-01-06 (×4): qty 1

## 2012-01-06 MED ORDER — ROCURONIUM BROMIDE 100 MG/10ML IV SOLN
INTRAVENOUS | Status: DC | PRN
  Administered 2012-01-06: 50 mg via INTRAVENOUS

## 2012-01-06 MED ORDER — CEFAZOLIN SODIUM 1-5 GM-% IV SOLN
1.0000 g | Freq: Four times a day (QID) | INTRAVENOUS | Status: AC
  Administered 2012-01-06 – 2012-01-07 (×3): 1 g via INTRAVENOUS
  Filled 2012-01-06 (×3): qty 50

## 2012-01-06 MED ORDER — ONDANSETRON HCL 4 MG/2ML IJ SOLN
INTRAMUSCULAR | Status: DC | PRN
  Administered 2012-01-06: 4 mg via INTRAVENOUS

## 2012-01-06 MED ORDER — ONDANSETRON HCL 4 MG PO TABS: 4.0000 mg | ORAL_TABLET | Freq: Four times a day (QID) | ORAL | Status: DC | PRN

## 2012-01-06 SURGICAL SUPPLY — 75 items
ANTI-ROTATION PEG ×6 IMPLANT
BENZOIN TINCTURE PRP APPL 2/3 (GAUZE/BANDAGES/DRESSINGS) IMPLANT
BLADE SAW SAG 29X58X.64 (BLADE) ×2 IMPLANT
BOOTCOVER CLEANROOM LRG (PROTECTIVE WEAR) IMPLANT
BOWL SMART MIX CTS (DISPOSABLE) ×2 IMPLANT
BRUSH FEMORAL CANAL (MISCELLANEOUS) IMPLANT
CEMENT BONE DEPUY (Cement) ×2 IMPLANT
CLOTH BEACON ORANGE TIMEOUT ST (SAFETY) ×2 IMPLANT
COVER SURGICAL LIGHT HANDLE (MISCELLANEOUS) ×2 IMPLANT
COVER TABLE BACK 60X90 (DRAPES) IMPLANT
DRAPE C-ARM 42X72 X-RAY (DRAPES) IMPLANT
DRAPE INCISE IOBAN 66X45 STRL (DRAPES) ×2 IMPLANT
DRAPE U-SHAPE 47X51 STRL (DRAPES) ×2 IMPLANT
DRSG MEPILEX BORDER 4X8 (GAUZE/BANDAGES/DRESSINGS) ×2 IMPLANT
DRSG PAD ABDOMINAL 8X10 ST (GAUZE/BANDAGES/DRESSINGS) IMPLANT
DURAPREP 26ML APPLICATOR (WOUND CARE) ×2 IMPLANT
ELECT BLADE 6.5 EXT (BLADE) IMPLANT
ELECT NEEDLE TIP 2.8 STRL (NEEDLE) ×2 IMPLANT
ELECT REM PT RETURN 9FT ADLT (ELECTROSURGICAL) ×2
ELECTRODE REM PT RTRN 9FT ADLT (ELECTROSURGICAL) ×1 IMPLANT
EVACUATOR 1/8 PVC DRAIN (DRAIN) IMPLANT
FACESHIELD LNG OPTICON STERILE (SAFETY) ×2 IMPLANT
GLOVE BIOGEL PI IND STRL 6.5 (GLOVE) ×1 IMPLANT
GLOVE BIOGEL PI IND STRL 8 (GLOVE) ×1 IMPLANT
GLOVE BIOGEL PI INDICATOR 6.5 (GLOVE) ×1
GLOVE BIOGEL PI INDICATOR 8 (GLOVE) ×1
GLOVE BIOGEL PI ORTHO PRO SZ7 (GLOVE) ×2
GLOVE ORTHO TXT STRL SZ7.5 (GLOVE) ×2 IMPLANT
GLOVE PI ORTHO PRO STRL SZ7 (GLOVE) ×2 IMPLANT
GLOVE SURG ORTHO 8.0 STRL STRW (GLOVE) ×4 IMPLANT
GLOVE SURG SS PI 7.0 STRL IVOR (GLOVE) ×4 IMPLANT
GOWN STRL NON-REIN LRG LVL3 (GOWN DISPOSABLE) IMPLANT
GOWN STRL REIN XL XLG (GOWN DISPOSABLE) ×4 IMPLANT
HANDPIECE INTERPULSE COAX TIP (DISPOSABLE) ×1
HOOD PEEL AWAY FACE SHEILD DIS (HOOD) ×4 IMPLANT
KIT BASIN OR (CUSTOM PROCEDURE TRAY) ×2 IMPLANT
KIT ROOM TURNOVER OR (KITS) ×2 IMPLANT
MANIFOLD NEPTUNE II (INSTRUMENTS) ×2 IMPLANT
NEEDLE 1/2 CIR CATGUT .05X1.09 (NEEDLE) ×2 IMPLANT
NEEDLE HYPO 25GX1X1/2 BEV (NEEDLE) ×2 IMPLANT
NS IRRIG 1000ML POUR BTL (IV SOLUTION) ×2 IMPLANT
PACK SHOULDER (CUSTOM PROCEDURE TRAY) ×2 IMPLANT
PAD ARMBOARD 7.5X6 YLW CONV (MISCELLANEOUS) ×4 IMPLANT
PIN STEINMANN THREADED TIP (PIN) ×2 IMPLANT
PIN THREADED REVERSE (PIN) ×2 IMPLANT
RETRIEVER SUT HEWSON (MISCELLANEOUS) IMPLANT
SET HNDPC FAN SPRY TIP SCT (DISPOSABLE) ×1 IMPLANT
SLING ARM IMMOBILIZER LRG (SOFTGOODS) ×2 IMPLANT
SLING ARM IMMOBILIZER MED (SOFTGOODS) IMPLANT
SMARTMIX MINI TOWER (MISCELLANEOUS)
SPONGE GAUZE 4X4 12PLY (GAUZE/BANDAGES/DRESSINGS) IMPLANT
SPONGE LAP 18X18 X RAY DECT (DISPOSABLE) ×2 IMPLANT
STRIP CLOSURE SKIN 1/2X4 (GAUZE/BANDAGES/DRESSINGS) ×4 IMPLANT
SUCTION FRAZIER TIP 10 FR DISP (SUCTIONS) ×2 IMPLANT
SUPPORT WRAP ARM LG (MISCELLANEOUS) ×2 IMPLANT
SUT ETHIBOND 2 0 SH (SUTURE)
SUT ETHIBOND 2 0 SH 36X2 (SUTURE) IMPLANT
SUT ETHIBOND 2 OS 4 DA (SUTURE) IMPLANT
SUT ETHIBOND CT1 BRD 2-0 30IN (SUTURE) IMPLANT
SUT FIBERWIRE #2 38 T-5 BLUE (SUTURE) ×8
SUT MNCRL AB 4-0 PS2 18 (SUTURE) ×2 IMPLANT
SUT VIC AB 0 CT1 27 (SUTURE) ×1
SUT VIC AB 0 CT1 27XBRD ANBCTR (SUTURE) ×1 IMPLANT
SUT VIC AB 2-0 CT1 27 (SUTURE) ×1
SUT VIC AB 2-0 CT1 TAPERPNT 27 (SUTURE) ×1 IMPLANT
SUT VIC AB 2-0 SH 18 (SUTURE) ×2 IMPLANT
SUT VIC AB 3-0 SH 18 (SUTURE) ×2 IMPLANT
SUTURE FIBERWR #2 38 T-5 BLUE (SUTURE) ×4 IMPLANT
SYR CONTROL 10ML LL (SYRINGE) ×2 IMPLANT
TOWEL OR 17X24 6PK STRL BLUE (TOWEL DISPOSABLE) ×2 IMPLANT
TOWEL OR 17X26 10 PK STRL BLUE (TOWEL DISPOSABLE) ×2 IMPLANT
TOWER SMARTMIX MINI (MISCELLANEOUS) IMPLANT
TRAY FOLEY CATH 14FR (SET/KITS/TRAYS/PACK) ×2 IMPLANT
TUBE SUCT ARGYLE STRL (TUBING) IMPLANT
WATER STERILE IRR 1000ML POUR (IV SOLUTION) ×2 IMPLANT

## 2012-01-06 NOTE — Progress Notes (Signed)
Pt arrived to 4733-2 via stretcher from PACU. Pt oriented to room and hospital policies.  Pt with no complaints.  Will con't to monitor and will carry out new orders.

## 2012-01-06 NOTE — Progress Notes (Signed)
Orth tech notified of overhead trapeze frame.  Ortho tech stated that currently there are non available but will place pt on wait list.

## 2012-01-06 NOTE — Op Note (Signed)
.01/06/2012  4:19 PM  PATIENT:  Cathy Taylor    PRE-OPERATIVE DIAGNOSIS:  Left Shoulder Arthritis  POST-OPERATIVE DIAGNOSIS:  Same  PROCEDURE:  TOTAL SHOULDER ARTHROPLASTY  SURGEON:  Eulas Post, MD  PHYSICIAN ASSISTANT: Janace Litten, OPA-C, present and scrubbed throughout the case, critical for completion in a timely fashion, and for retraction, instrumentation, and closure.  ANESTHESIA:   General  PREOPERATIVE INDICATIONS:  [name] is a  78 y.o. female with a diagnosis of Left Shoulder Arthritis who failed conservative measures and elected for surgical management.    The risks benefits and alternatives were discussed with the patient preoperatively including but not limited to the risks of infection, bleeding, nerve injury, cardiopulmonary complications, the need for revision surgery, among others, and the patient was willing to proceed.  OPERATIVE IMPLANTS: Biomet size small glenoid with regenerate pain, cemented 3 peg glenoid with ingrown central stem, with a size 8 primary standard length shoulder stem, with a size 46 x 18 x 53 mm humeral head and a versa dial head neck component, set at C., with one bag of the depuy 1 bone cement for the glenoid.  OPERATIVE FINDINGS: Severe medialization with glenoid erosion, very small glenoid vault, with generalized osteopenia. There was severe grade 4 chondral loss on both sides, with extensive osteophyte formation, and flattening of the humeral head. The rotator cuff was intact.  OPERATIVE PROCEDURE: The patient is brought to the operating room and placed in the supine position. General anesthesia was administered. Regional block targeting given. IV antibiotics were given. She was placed in a beachchair position. Foley was administered. The left upper extremity was prepped and draped in usual sterile fashion. Deltopectoral approach was performed. The cephalic vein was fairly diminutive, but was taken laterally.  Deep retractors were  placed. The biceps tendon was tenodesis, although it was encased in bone, and in very poor condition. This was sewn to the pectoralis tendon.  The subscapularis was released using a tenotomy technique, and tagged with a FiberWire suture. I then perform circumferential release of the inferior humeral capsule, and removed inferior osteophytes. Care was taken at her during this portion of the case to protect the axillary nerve.  I then dislocated the humeral head, placed deep retractors, and then sequentially reamed up to a size 8. This had fairly substantial cortical bite. I initially started with a 7, however this got almost no bite, so I had to push to get an 8. I then cut the humeral head using the appropriate jig set at 30 of retroversion. This resected the head, exiting at the reflection of the infraspinatus.  I then placed deep retractors, and exposed the glenoid. I released the glenoid labrum circumferentially, and mobilized the labrum completely, achieving adequate releases, to have adequate exposure to the glenoid. I placed a central guidepin, and then reamed. I did this based on the template from the CT scan. The reamer was utilized very little, in order to minimize loss of bone, given her severe medialization. Primarily I used a reamer to remove the anterior osteophyte.  I then drilled, for the central regenerate head. This drill perforated the medial cortex. The 3 hole template the drill guide was placed, and I drilled the peripheral holes. The anterior inferior 1 perforated slightly, although the posterior and superior hole had reasonable end points. This entire portion of the operation was fairly dicey, as there was not much bone to work with. Nonetheless I did the best I could, and prepared the glenoid,  and then cemented the real glenoid into place. I had satisfactory fixation, given the poor quality glenoid vault.  I then turned my attention back to the humerus after the cement had fully  cured. I reamed with the 8 to make sure that 8 down, because when I broached with a 7 the 7 was still loose rotationally, and so I did have to either cement at this point or press-fit with an 8.  I was able to get an 8 in the appropriate position and seated well. I trialed with the above-named head, and it had appropriate restoration of soft tissue tension, as well as translation on the glenoid. I optimize the dial position, in order to provide adequate coverage both inferiorly as well as superiorly, and optimized the medial lateral coverage.  I then impacted the real prosthesis into place, and then placed the real humeral head. At this point in the case, there was some concern regarding the possibility of inverted T waves, according to anesthesia. We rapidly repaired the subscapularis using a total of 4 #2 FiberWire, and irrigated the wounds copiously, and repaired the subcutaneous tissue with Vicryl followed by Steri-Strips for the skin. Sterile gauze was applied and she returned to PACU in stable and satisfactory condition. Her vital signs remained stable, although given the concern regarding EKG changes, we will get a real EKG in the recovery room as well as cardiac enzymes and admit her to telemetry. There were no complications, although current laboratory studies are pending.

## 2012-01-06 NOTE — Anesthesia Postprocedure Evaluation (Signed)
Anesthesia Post Note  Patient: Cathy Taylor  Procedure(s) Performed: Procedure(s) (LRB): TOTAL SHOULDER ARTHROPLASTY (Left)  Anesthesia type: general  Patient location: PACU  Post pain: Pain level controlled  Post assessment: Patient's Cardiovascular Status Stable  Last Vitals:  Filed Vitals:   01/06/12 1649  BP:   Pulse:   Temp: 36.4 C  Resp:     Post vital signs: Reviewed and stable  Level of consciousness: sedated  Complications: No apparent anesthesia complications

## 2012-01-06 NOTE — Transfer of Care (Signed)
Immediate Anesthesia Transfer of Care Note  Patient: Cathy Taylor  Procedure(s) Performed: Procedure(s) (LRB): TOTAL SHOULDER ARTHROPLASTY (Left)  Patient Location: PACU  Anesthesia Type: GA combined with regional for post-op pain  Level of Consciousness: awake, alert  and oriented  Airway & Oxygen Therapy: Patient connected to nasal cannula oxygen  Post-op Assessment: Report given to PACU RN, Post -op Vital signs reviewed and stable and Patient moving all extremities X 4  Post vital signs: Reviewed and stable  Complications: No apparent anesthesia complications

## 2012-01-06 NOTE — Preoperative (Signed)
Beta Blockers   Reason not to administer Beta Blockers:Not Applicable 

## 2012-01-06 NOTE — OR Nursing (Signed)
Flash sterilized "Humeral Head Jig Screw" from [employer] "Comprehensive Total Shoulder Set" for 3 minutes at 270 degrees. No indicator included. Not used with patient.

## 2012-01-06 NOTE — Anesthesia Preprocedure Evaluation (Addendum)
Anesthesia Evaluation  Patient identified by MRN, date of birth, ID band Patient awake    Reviewed: Allergy & Precautions, H&P , NPO status , Patient's Chart, lab work & pertinent test results, reviewed documented beta blocker date and time   Airway Mallampati: II TM Distance: <3 FB Neck ROM: Full    Dental  (+) Teeth Intact, Caps and Dental Advisory Given   Pulmonary shortness of breath (hospitalized December 2012 with bronchitis, left lower lobe collapse per CT) and with exertion,  breath sounds clear to auscultation        Cardiovascular hypertension, Pt. on medications + Valvular Problems/Murmurs (heart murmur) Rhythm:Regular Rate:Normal     Neuro/Psych PSYCHIATRIC DISORDERS Anxiety Depression    GI/Hepatic GERD-  Medicated and Controlled,  Endo/Other  Diabetes mellitus-, Well Controlled, Type 2, Oral Hypoglycemic Agents  Renal/GU      Musculoskeletal   Abdominal   Peds  Hematology   Anesthesia Other Findings   Reproductive/Obstetrics                         Anesthesia Physical Anesthesia Plan  ASA: III  Anesthesia Plan: General   Post-op Pain Management: MAC Combined w/ Regional for Post-op pain   Induction: Intravenous  Airway Management Planned: Oral ETT  Additional Equipment:   Intra-op Plan:   Post-operative Plan: Extubation in OR  Informed Consent: I have reviewed the patients History and Physical, chart, labs and discussed the procedure including the risks, benefits and alternatives for the proposed anesthesia with the patient or authorized representative who has indicated his/her understanding and acceptance.   Dental advisory given  Plan Discussed with: CRNA, Anesthesiologist and Surgeon  Anesthesia Plan Comments:         Anesthesia Quick Evaluation

## 2012-01-06 NOTE — H&P (Signed)
PREOPERATIVE H&P  Chief Complaint: DJD  LEFT SHOULDER  HPI: Cathy Taylor is a 78 y.o. female who presents for preoperative history and physical with a diagnosis of DJD  LEFT SHOULDER. Symptoms are rated as moderate to severe, and have been worsening.  This is significantly impairing activities of daily living.  She has elected for surgical management.   Past Medical History  Diagnosis Date  . Stress fracture of left femur with nonunion 07/08/2011  . PMR (polymyalgia rheumatica)   . GERD (gastroesophageal reflux disease)   . Diabetes mellitus   . Anxiety   . Hypertension   . Abdominal hernia   . Hypercholesteremia   . Heart murmur   . Bronchitis 09/10/11    "that's what I'm here for"  . Arthritis   . Chronic back pain greater than 3 months duration   . Bronchitis 11/12  . Bronchiectasis 11/12  . 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 Cancer 75    breast ca  . Depression    Past Surgical History  Procedure Date  . Tubal ligation   . Cataract extraction w/ intraocular lens implant 07/2011    left  . Shoulder arthroscopy 06/2000    left  . Breast surgery   . Breast biopsy 1981    left  . Mastectomy 1973    right  . Femur surgery 07/07/2011    left rod placed  . Femur im nail     left   History   Social History  . Marital Status: Married    Spouse Name: N/A    Number of Children: N/A  . Years of Education: N/A   Social History Main Topics  . Smoking status: Never Smoker   . Smokeless tobacco: Former Neurosurgeon    Types: Snuff   Comment: "just tried smoking; didn't smoke to amount to nothing"  . Alcohol Use: No  . Drug Use: No  . Sexually Active: Not Currently   Other Topics Concern  . None   Social History Narrative  . None   History reviewed. No pertinent family history. No Known Allergies Prior to Admission medications   Medication Sig Start Date End Date Taking? Authorizing Provider  acetaminophen (TYLENOL) 500 MG tablet Take 500 mg by mouth every 6  (six) hours as needed. For pain    Yes Historical Provider, MD  aspirin EC 81 MG tablet Take 81 mg by mouth daily.     Yes Historical Provider, MD  benazepril-hydrochlorthiazide (LOTENSIN HCT) 20-12.5 MG per tablet Take 1 tablet by mouth daily.     Yes Historical Provider, MD  celecoxib (CELEBREX) 200 MG capsule Take 200 mg by mouth daily.     Yes Historical Provider, MD  clotrimazole-betamethasone (LOTRISONE) cream Apply 1 application topically 2 (two) times daily as needed. rash   Yes Historical Provider, MD  diclofenac sodium (VOLTAREN) 1 % GEL Apply 1 application topically 4 (four) times daily as needed. Pain in shoulder   Yes Historical Provider, MD  diltiazem (CARDIZEM CD) 240 MG 24 hr capsule Take 1 tablet by mouth daily. 09/18/11  Yes Historical Provider, MD  DULoxetine (CYMBALTA) 30 MG capsule Take 30 mg by mouth daily.     Yes Historical Provider, MD  esomeprazole (NEXIUM) 40 MG capsule Take 40 mg by mouth daily.     Yes Historical Provider, MD  ezetimibe (ZETIA) 10 MG tablet Take 10 mg by mouth daily.     Yes Historical Provider, MD  glimepiride (  AMARYL) 1 MG tablet Take 1 mg by mouth daily.     Yes Historical Provider, MD  metFORMIN (GLUCOPHAGE) 500 MG tablet Take 500 mg by mouth See admin instructions. Take two tablets in the morning and one tablet at night   Yes Historical Provider, MD  predniSONE (DELTASONE) 5 MG tablet Take 2.5 mg by mouth daily.  07/24/11  Yes Historical Provider, MD  simvastatin (ZOCOR) 40 MG tablet Take 40 mg by mouth at bedtime.     Yes Historical Provider, MD  traMADol (ULTRAM) 50 MG tablet Take 50-100 mg by mouth at bedtime. Takes two tablets if pain is severe. 08/21/11  Yes Historical Provider, MD  albuterol (PROVENTIL HFA;VENTOLIN HFA) 108 (90 BASE) MCG/ACT inhaler Inhale 2 puffs into the lungs every 6 (six) hours as needed. Shortness of breath    Historical Provider, MD  calcium citrate-vitamin D (CITRACAL+D) 315-200 MG-UNIT per tablet Take 1 tablet by mouth  daily.    Historical Provider, MD  clorazepate (TRANXENE) 3.75 MG tablet Take 3.75 mg by mouth 2 (two) times daily as needed. For anxiety     Historical Provider, MD  Multiple Vitamins-Minerals (MULTIVITAMINS THER. W/MINERALS) TABS Take 1 tablet by mouth daily.      Historical Provider, MD     Positive ROS: All other systems have been reviewed and were otherwise negative with the exception of those mentioned in the HPI and as above.  Physical Exam: General: Alert, no acute distress Cardiovascular: No pedal edema Respiratory: No cyanosis, no use of accessory musculature GI: No organomegaly, abdomen is soft and non-tender Skin: No lesions in the area of chief complaint Neurologic: Sensation intact distally Psychiatric: Patient is competent for consent with normal mood and affect Lymphatic: No axillary or cervical lymphadenopathy  MUSCULOSKELETAL: Left shoulder active forward flexion is 0-80. Her rotator cuff strength is fair, but limited to pain. She has significant loss of external rotation.  Assessment: DJD  LEFT SHOULDER  Plan: Plan for Procedure(s): TOTAL SHOULDER ARTHROPLASTY  The risks benefits and alternatives were discussed with the patient including but not limited to the risks of nonoperative treatment, versus surgical intervention including infection, bleeding, nerve injury,  blood clots, cardiopulmonary complications, morbidity, mortality, among others, and they were willing to proceed. We have also discussed the risks for reverse shoulder arthroplasty, as well as the need for revision surgery, incomplete relief of pain, dislocation, among others.  Eulas Post, MD 01/06/2012 12:49 PM

## 2012-01-06 NOTE — Progress Notes (Signed)
CBC, Cardiac Enzymes drawn by lab.

## 2012-01-06 NOTE — Anesthesia Procedure Notes (Signed)
Anesthesia Regional Block:  Interscalene brachial plexus block  Pre-Anesthetic Checklist: ,, timeout performed, Correct Patient, Correct Site, Correct Laterality, Correct Procedure, Correct Position, site marked, Risks and benefits discussed,  Surgical consent,  Pre-op evaluation,  At surgeon's request and post-op pain management  Laterality: Left  Prep: chloraprep       Needles:   Needle Type: Echogenic Stimulator Needle     Needle Length: 5cm 5 cm Needle Gauge: 22 and 22 G    Additional Needles:  Procedures: ultrasound guided Interscalene brachial plexus block Narrative:  Start time: 01/06/2012 12:40 PM End time: 01/06/2012 12:50 PM  Performed by: Personally   Additional Notes: 20 cc 0.5% marcaine 1:200 Epi injected without difficulty  Kipp Brood, MD

## 2012-01-07 LAB — CBC
Hemoglobin: 11 g/dL — ABNORMAL LOW (ref 12.0–15.0)
MCH: 28.3 pg (ref 26.0–34.0)
Platelets: 262 10*3/uL (ref 150–400)
RDW: 16.7 % — ABNORMAL HIGH (ref 11.5–15.5)

## 2012-01-07 LAB — BASIC METABOLIC PANEL
Calcium: 9.7 mg/dL (ref 8.4–10.5)
Creatinine, Ser: 0.62 mg/dL (ref 0.50–1.10)
GFR calc Af Amer: >90 mL/min (ref >90)

## 2012-01-07 LAB — CARDIAC PANEL(CRET KIN+CKTOT+MB+TROPI): Relative Index: 1.8 (ref 0.0–2.5)

## 2012-01-07 NOTE — Progress Notes (Signed)
Orthopedic Tech Progress Note Patient Details:  Cathy Taylor Mar 25, 1933 [id number]  Patient ID: [name], female   DOB: [birth year], 78 y.o.   MRN: [id number]   Shawnie Pons 01/07/2012, 6:30 AM Trapeze bar

## 2012-01-07 NOTE — Evaluation (Signed)
Occupational Therapy Evaluation Patient Details Name: Cathy Taylor MRN: [id number] DOB: 1933/09/16 Today's Date: 01/07/2012  Problem List:  Patient Active Problem List  Diagnoses  . Dyspnea  . Hypertension  . Diabetes mellitus  . PMR (polymyalgia rheumatica)  . Breast cancer  . Collapsed lung  . Hyponatremia  . Bronchiectasis  . Osteoarthritis of left shoulder    Past Medical History:  Past Medical History  Diagnosis Date  . Stress fracture of left femur with nonunion 07/08/2011  . PMR (polymyalgia rheumatica)   . GERD (gastroesophageal reflux disease)   . Anxiety   . Hypertension   . Abdominal hernia   . Hypercholesteremia   . Heart murmur   . Chronic back pain greater than 3 months duration   . Bronchiectasis 11/12  . 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 Depression   . Bronchitis     "that's what I'm here for"  . Type II diabetes mellitus   . Umbilical hernia 01/06/12    unrepaired  . Breast cancer 1973    right  . Arthritis     "everywhere  . Osteoarthritis of left shoulder 01/06/2012   Past Surgical History:  Past Surgical History  Procedure Date  . Tubal ligation   . Cataract extraction w/ intraocular lens implant 07/2011    left  . Shoulder arthroscopy 06/2000    left  . Femur surgery 07/07/2011    left rod placed  . Total shoulder arthroplasty 01/06/12    left  . Breast biopsy 1981    left  . Mastectomy 1973    right    OT Assessment/Plan/Recommendation OT Assessment Clinical Impression Statement: This 78 y.o. female admitted for Lt. TSA.  Pt presents to OT with the below listed deficits.  Pt. will benefit from continued OT to maximize safety and independence with BADLs to allow her to return home with husband/family assist.  Pt. reports husband with limited ability to assist her at discharge.  Recommend HHaid, and HHOT at discharge.  Did discuss the possibility of SNF level rehab, but she refuses. OT Therapy Diagnosis : Generalized weakness;Acute  pain OT Plan OT Frequency: Min 2X/week OT Treatment/Interventions: Self-care/ADL training;DME and/or AE instruction;Therapeutic activities;Patient/family education;Balance training OT Recommendation Follow Up Recommendations: Home health OT;Supervision/Assistance - 24 hour (HHaide) Equipment Recommended: None recommended by OT Individuals Consulted Consulted and Agree with Results and Recommendations: Patient OT Goals Acute Rehab OT Goals OT Goal Formulation: With patient Time For Goal Achievement: 7 days ADL Goals Pt Will Perform Grooming: with modified independence;Standing at sink ADL Goal: Grooming - Progress: Goal set today Pt Will Perform Upper Body Bathing: with min assist;Sitting, edge of bed ADL Goal: Upper Body Bathing - Progress: Goal set today Pt Will Perform Lower Body Bathing: with supervision;Sit to stand from chair;Sit to stand from bed ADL Goal: Lower Body Bathing - Progress: Goal set today Pt Will Perform Upper Body Dressing: with min assist;Sitting, bed;Unsupported ADL Goal: Upper Body Dressing - Progress: Goal set today Pt Will Perform Lower Body Dressing: with min assist;Sit to stand from chair;Sit to stand from bed ADL Goal: Lower Body Dressing - Progress: Goal set today Pt Will Transfer to Toilet: with supervision;Ambulation Pt Will Perform Toileting - Clothing Manipulation: with min assist;Standing ADL Goal: Toileting - Clothing Manipulation - Progress: Goal set today Pt Will Perform Toileting - Hygiene: with modified independence;Sitting on 3-in-1 or toilet ADL Goal: Toileting - Hygiene - Progress: Goal set today Additional ADL Goal #1: Pt/caregiver will  be independent with shoulder precautions ADL Goal: Additional Goal #1 - Progress: Goal set today Additional ADL Goal #2: Pt./caregiver will be independent with sling wear and care ADL Goal: Additional Goal #2 - Progress: Goal set today  OT Evaluation Precautions/Restrictions  Precautions Precautions:  Shoulder Type of Shoulder Precautions: Sling at all times.  No activity ordered.  OT for sling e Required Braces or Orthoses: Other Brace/Splint (sling) Restrictions Weight Bearing Restrictions: Yes LUE Weight Bearing: Non weight bearing Prior Functioning Home Living Lives With: Spouse (who has Parkinson's) Available Help at Discharge: Family (pt. states husband is not able to assit much) Type of Home: House Home Access: Stairs to enter [employer] of Steps: 4 (rear enterance) Home Layout: One level Bathroom Shower/Tub: Health visitor: Standard Bathroom Accessibility: Yes How Accessible: Accessible via walker Home Adaptive Equipment: Shower chair without back;Straight cane;Walker - rolling Additional Comments: Pt. 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

## 2012-01-07 NOTE — Progress Notes (Signed)
Orthopedic Tech Progress Note Patient Details:  Cathy Taylor 10/15/1932 [id number]  Patient ID: [name], female   DOB: Mar 19, 1933, 78 y.o.   MRN: [id number]  Patient has sling immobilizer. Waist strap was not connected. Connected strap for comfort. [name] T 01/07/2012, 1:46 PM

## 2012-01-07 NOTE — Progress Notes (Signed)
Patient ID: Cathy Taylor, female   DOB: [birth year], 78 y.o.   MRN: [id number]     Subjective:  Patient reports pain as mild to moderate.  She denies nay numbness, CP or SOB  Objective:   VITALS:   Filed Vitals:   01/07/12 0906 01/07/12 1004 01/07/12 1100 01/07/12 1326  BP: 114/62 108/59 114/50 98/62  Pulse: 73 71 73 69  Temp: 98.4 F (36.9 C)  98.4 F (36.9 C) 97.8 F (36.6 C)  TempSrc: Oral  Oral Oral  Resp: 14  14 18   Weight:      SpO2: 100%  95% 95%    Sensation intact distally Dorsiflexion/Plantar flexion intact Incision: dressing C/D/I and no drainage  LABS  Results for orders placed during the hospital encounter of 01/06/12 (from the past 24 hour(s))  CBC     Status: Abnormal   Collection Time   01/07/12  6:17 AM      Component Value Range   WBC 9.8  4.0 - 10.5 (K/uL)   RBC 3.89  3.87 - 5.11 (MIL/uL)   Hemoglobin 11.0 (*) 12.0 - 15.0 (g/dL)   HCT 82.9 (*) 56.2 - 46.0 (%)   MCV 83.5  78.0 - 100.0 (fL)   MCH 28.3  26.0 - 34.0 (pg)   MCHC 33.8  30.0 - 36.0 (g/dL)   RDW 13.0 (*) 86.5 - 15.5 (%)   Platelets 262  150 - 400 (K/uL)  BASIC METABOLIC PANEL     Status: Abnormal   Collection Time   01/07/12  6:17 AM      Component Value Range   Sodium 134 (*) 135 - 145 (mEq/L)   Potassium 4.6  3.5 - 5.1 (mEq/L)   Chloride 98  96 - 112 (mEq/L)   CO2 24  19 - 32 (mEq/L)   Glucose, Bld 141 (*) 70 - 99 (mg/dL)   BUN 11  6 - 23 (mg/dL)   Creatinine, Ser 7.84  0.50 - 1.10 (mg/dL)   Calcium 9.7  8.4 - 69.6 (mg/dL)   GFR calc non Af Amer 85 (*) >90 (mL/min)   GFR calc Af Amer >90  >90 (mL/min)  GLUCOSE, CAPILLARY     Status: Abnormal   Collection Time   01/07/12  7:01 AM      Component Value Range   Glucose-Capillary 146 (*) 70 - 99 (mg/dL)  CARDIAC PANEL(CRET KIN+CKTOT+MB+TROPI)     Status: Abnormal   Collection Time   01/07/12  9:44 AM      Component Value Range   Total CK 305 (*) 7 - 177 (U/L)   CK, MB 5.5 (*) 0.3 - 4.0 (ng/mL)   Troponin I <0.30  <0.30 (ng/mL)     Relative Index 1.8  0.0 - 2.5     Dg Shoulder Left  01/06/2012  *RADIOLOGY REPORT*  Clinical Data: Shoulder replacement.  LEFT SHOULDER - 2+ VIEW  Comparison: CT scan 01/02/2012.  Findings: The patient has a new left shoulder arthroplasty.  The device is located.  There is no fracture.  Gas in the soft tissues noted.  IMPRESSION: Left shoulder replacement without evidence of complication.  Original Report Authenticated By: Bernadene Bell. Maricela Curet, M.D.    Assessment/Plan: 1 Day Post-Op   Principal Problem:  *Osteoarthritis of left shoulder  Cardiac enzymes negative x 2.  Epic cancelled the 2nd and 3rd, and then I reordered this morning, new set was negative.  I also reviewed the ekg with anesthesia, no significant change.  No tele issues.  Transfer to ortho.  Advance diet Up with therapy Will plan for DC tomorrow or Friday.   Kelyn Koskela P 01/07/2012, 7:17 PM   Teryl Lucy, MD [phone] pager

## 2012-01-07 NOTE — Evaluation (Signed)
Physical Therapy Evaluation Patient Details Name: Cathy Taylor MRN: [id number] DOB: 1933-08-18 Today's Date: 01/07/2012  Problem List:  Patient Active Problem List  Diagnoses  . Dyspnea  . Hypertension  . Diabetes mellitus  . PMR (polymyalgia rheumatica)  . Breast cancer  . Collapsed lung  . Hyponatremia  . Bronchiectasis  . Osteoarthritis of left shoulder    Past Medical History:  Past Medical History  Diagnosis Date  . Stress fracture of left femur with nonunion 07/08/2011  . PMR (polymyalgia rheumatica)   . GERD (gastroesophageal reflux disease)   . Anxiety   . Hypertension   . Abdominal hernia   . Hypercholesteremia   . Heart murmur   . Chronic back pain greater than 3 months duration   . Bronchiectasis 11/12  . 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 Depression   . Bronchitis     "that's what I'm here for"  . Type II diabetes mellitus   . Umbilical hernia 01/06/12    unrepaired  . Breast cancer 1973    right  . Arthritis     "everywhere  . Osteoarthritis of left shoulder 01/06/2012   Past Surgical History:  Past Surgical History  Procedure Date  . Tubal ligation   . Cataract extraction w/ intraocular lens implant 07/2011    left  . Shoulder arthroscopy 06/2000    left  . Femur surgery 07/07/2011    left rod placed  . Total shoulder arthroplasty 01/06/12    left  . Breast biopsy 1981    left  . Mastectomy 1973    right    PT Assessment/Plan/Recommendation PT Assessment Clinical Impression Statement: Pt is a 78 y/o female admitted for Lt total shoulder arthroplasty. Pt will benefit from acute PT  maximize pt functional potential for discharge to home.  PT Recommendation/Assessment: Patient will need skilled PT in the acute care venue PT Problem List: Decreased activity tolerance;Decreased mobility;Pain Barriers to Discharge: None PT Therapy Diagnosis : Difficulty walking PT Plan PT Frequency: Min 3X/week PT Treatment/Interventions: Gait training;DME  instruction;Functional mobility training;Therapeutic activities;Balance training PT Recommendation Follow Up Recommendations: Home health PT Equipment Recommended: None recommended by PT PT Goals  Acute Rehab PT Goals PT Goal Formulation: With patient Time For Goal Achievement: 7 days Pt will go Supine/Side to Sit: with modified independence PT Goal: Supine/Side to Sit - Progress: Goal set today Pt will go Sit to Supine/Side: with modified independence;with HOB 0 degrees PT Goal: Sit to Supine/Side - Progress: Goal set today Pt will Transfer Bed to Chair/Chair to Bed: with modified independence PT Transfer Goal: Bed to Chair/Chair to Bed - Progress: Goal set today Pt will Ambulate: 51 - 150 feet;with supervision PT Goal: Ambulate - Progress: Goal set today  PT Evaluation Precautions/Restrictions  Precautions Precautions: Shoulder Type of Shoulder Precautions: Sling at all times.  No activity ordered.  OT for sling e Required Braces or Orthoses: Other Brace/Splint (sling) Restrictions Weight Bearing Restrictions: Yes LUE Weight Bearing: Non weight bearing Prior Functioning  Home Living Lives With: Spouse (who has Parkinson's) Available Help at Discharge: Family (pt. states husband is not able to assit much) Type of Home: House Home Access: Stairs to enter [employer] of Steps: 4 (rear enterance) Home Layout: One level Bathroom Shower/Tub: Health visitor: Standard Bathroom Accessibility: Yes How Accessible: Accessible via walker Home Adaptive Equipment: Shower chair without back;Straight cane;Walker - rolling Additional Comments: Pt. reports she sponge bathes Prior Function Level of Independence: Independent  with assistive device(s) (ambulates with SPC) Able to Take Stairs?: Yes Driving: Yes Vocation: Retired Financial risk analyst Arousal/Alertness: Awake/alert Overall Cognitive Status: Appears within functional limits for tasks  assessed Orientation Level: Oriented X4 Sensation/Coordination Sensation Light Touch: Appears Intact Proprioception: Appears Intact Coordination Gross Motor Movements are Fluid and Coordinated: Yes Fine Motor Movements are Fluid and Coordinated: Not tested Extremity Assessment RUE Assessment RUE Assessment: Within Functional Limits LUE Assessment LUE Assessment: Exceptions to [facility] RLE Assessment RLE Assessment: Within Functional Limits LLE Assessment LLE Assessment: Within Functional Limits Mobility (including Balance) Bed Mobility Bed Mobility: Yes Supine to Sit: Not tested (comment) Sitting - Scoot to Edge of Bed: Not tested (comment) Sit to Sidelying Right: 4: Min assist;HOB flat Sit to Sidelying Right Details (indicate cue type and reason): Repeated x2 for practice. Cues for technique assist to raise LEs. Pt performing 80% Transfers Transfers: Yes Sit to Stand: 4: Min assist;With upper extremity assist Sit to Stand Details (indicate cue type and reason): Initially pt struggled to stand rocking and requirring several attempts to gain momentum. Instructed pt lean forward like she were falling out the chair and pt able to complete transfer on first attempt.   Stand to Sit: 5: Supervision;With upper extremity assist;To bed Stand to Sit Details: Cues to place right hand on bed.  Ambulation/Gait Ambulation/Gait: Yes Ambulation/Gait Assistance: 5: Supervision Ambulation/Gait Assistance Details (indicate cue type and reason): 80 feet with HHA pt a little unsteady may benefit from use of a cane next session.  Ambulation Distance (Feet): 80 Feet Assistive device: 1 person hand held assist Gait Pattern: Decreased stride length Stairs: No Wheelchair Mobility Wheelchair Mobility: No  Posture/Postural Control Posture/Postural Control: No significant limitations Balance Balance Assessed: No Exercise    End of Session PT - End of Session Equipment Utilized During Treatment: Gait  belt Activity Tolerance: Patient tolerated treatment well Patient left: in bed;with call bell in reach;with bed alarm set Nurse Communication: Mobility status for transfers;Mobility status for ambulation General Behavior During Session: [facility] for tasks performed Cognition: [facility] for tasks performed  Taimur Fier 01/07/2012, 5:54 PM Lexus Shampine L. Charis Juliana DPT [phone]

## 2012-01-07 NOTE — Progress Notes (Signed)
INITIAL ADULT NUTRITION ASSESSMENT Date: 01/07/2012   Time: 12:16 PM Reason for Assessment: Nutrition Risk, unintentional weight loss  ASSESSMENT: Female 78 y.o.  Dx: Osteoarthritis of left shoulder  Hx:  Past Medical History  Diagnosis Date  . Stress fracture of left femur with nonunion 07/08/2011  . PMR (polymyalgia rheumatica)   . GERD (gastroesophageal reflux disease)   . Anxiety   . Hypertension   . Abdominal hernia   . Hypercholesteremia   . Heart murmur   . Chronic back pain greater than 3 months duration   . Bronchiectasis 11/12  . 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 Depression   . Bronchitis     "that's what I'm here for"  . Type II diabetes mellitus   . Umbilical hernia 01/06/12    unrepaired  . Breast cancer 1973    right  . Arthritis     "everywhere  . Osteoarthritis of left shoulder 01/06/2012    Related Meds:     . aspirin EC  81 mg Oral Daily  . benazepril  20 mg Oral Daily  . calcium-vitamin D  1 tablet Oral Daily  .  ceFAZolin (ANCEF) IV  1 g Intravenous 60 min Pre-Op  .  ceFAZolin (ANCEF) IV  1 g Intravenous Q6H  . clotrimazole   Topical BID  . docusate sodium  100 mg Oral BID  . DULoxetine  30 mg Oral Daily  . enoxaparin  40 mg Subcutaneous Q24H  . ezetimibe  10 mg Oral Daily  . glimepiride  1 mg Oral Daily  . hydrochlorothiazide  12.5 mg Oral Daily  . metFORMIN  1,000 mg Oral Q breakfast  . metFORMIN  500 mg Oral QHS  . pantoprazole  40 mg Oral Daily  . predniSONE  2.5 mg Oral Daily  . senna  1 tablet Oral BID  . simvastatin  40 mg Oral QHS  . traMADol  50-100 mg Oral QHS  . DISCONTD: benazepril-hydrochlorthiazide  1 tablet Oral Daily  . DISCONTD: calcium citrate-vitamin D  1 tablet Oral Daily  . DISCONTD: metFORMIN  500 mg Oral See admin instructions     Ht:  5\' 4"  Ht Readings from Last 3 Encounters:  01/05/12 5\' 2"  (1.575 m)  10/08/11 5\' 4"  (1.626 m)  09/10/11 5\' 4"  (1.626 m)     Wt: 169 lb 6.4 oz (76.839 kg) (Bed  Scale)  Ideal Wt:    54.5 kg % Ideal Wt: 140%  Usual Wt: "at one point was 200 lbs" Wt Readings from Last 3 Encounters:  01/07/12 169 lb 6.4 oz (76.839 kg)  01/07/12 169 lb 6.4 oz (76.839 kg)  01/05/12 166 lb 1.6 oz (75.342 kg)  07/08/11 173 lbs (78.7 kg)  % Usual Wt: 85%  BMI= 29.3 kg/(m^2) Overweight  Food/Nutrition Related Hx: Poor appetite r/t pain in knee/shoulder. Is trying to eat less to lose weight as well.   Labs:  CMP     Component Value Date/Time   NA 134* 01/07/2012 0617   K 4.6 01/07/2012 0617   CL 98 01/07/2012 0617   CO2 24 01/07/2012 0617   GLUCOSE 141* 01/07/2012 0617   BUN 11 01/07/2012 0617   CREATININE 0.62 01/07/2012 0617   CALCIUM 9.7 01/07/2012 0617   PROT 7.2 07/07/2011 1437   ALBUMIN 3.9 07/07/2011 1437   AST 15 07/07/2011 1437   ALT 17 07/07/2011 1437   ALKPHOS 73 07/07/2011 1437   BILITOT 0.3 07/07/2011 1437   GFRNONAA  85* 01/07/2012 0617   GFRAA >90 01/07/2012 0617     Intake/Output Summary (Last 24 hours) at 01/07/12 1221 Last data filed at 01/07/12 1028  Gross per 24 hour  Intake 4716.25 ml  Output   3050 ml  Net 1666.25 ml     Diet Order: Clear Liquid  Supplements/Tube Feeding: none  IVF:    0.45 % NaCl with KCl 20 mEq / L Last Rate: 75 mL/hr (01/07/12 1201)  DISCONTD: lactated ringers Last Rate: 50 mL/hr at 01/06/12 1230    Estimated Nutritional Needs:   Kcal: 1600-1800  Protein: 80-90 Fluid:  1.6- 1.8 L  Pt with both intentional and unintentional weight loss. Weight loss is desirable in this pt AEB BMI being overweight. Pt reports her appetite is normal now and is limiting her intake to prevent weight gain.   NUTRITION DIAGNOSIS: No nutrition DX at this time  EDUCATION NEEDS: -No education needs identified at this time  INTERVENTION: No nutrition interventions at this time.   Dietitian [phone]  DOCUMENTATION CODES Per approved criteria  -Not Applicable    Clarene Duke MARIE 01/07/2012, 12:16 PM

## 2012-01-08 LAB — GLUCOSE, CAPILLARY
Glucose-Capillary: 92 mg/dL (ref 70–99)
Glucose-Capillary: 184 mg/dL — ABNORMAL HIGH (ref 70–99)

## 2012-01-08 MED ORDER — OXYCODONE-ACETAMINOPHEN 5-325 MG PO TABS: 1.0000 | ORAL_TABLET | Freq: Four times a day (QID) | ORAL | Status: AC | PRN

## 2012-01-08 NOTE — Progress Notes (Signed)
Occupational Therapy Treatment Patient Details Name: Cathy Taylor MRN: [id number] DOB: 09/19/1933 Today's Date: 01/08/2012  OT Assessment/Plan OT Assessment/Plan Comments on Treatment Session: Pt husband present today for education on assisting pt. with ADLs and sling.  He is able to assist, but will require practice to ensure carry over.  Pt. demonstrated significant difficulty with ADLs, and poor ability to problem solve through errors.  She will need 24 hour supervision, HHaide, and HHOT OT Plan: Discharge plan remains appropriate OT Frequency: Min 2X/week Follow Up Recommendations: Home health OT;Supervision/Assistance - 24 hour Equipment Recommended: Cane OT Goals ADL Goals ADL Goal: Upper Body Bathing - Progress: Progressing toward goals ADL Goal: Lower Body Bathing - Progress: Progressing toward goals ADL Goal: Upper Body Dressing - Progress: Progressing toward goals ADL Goal: Lower Body Dressing - Progress: Progressing toward goals ADL Goal: Additional Goal #1 - Progress: Progressing toward goals ADL Goal: Additional Goal #2 - Progress: Progressing toward goals  OT Treatment Precautions/Restrictions  Precautions Precautions: Shoulder Type of Shoulder Precautions: Sling on at all times.  No activity ordered for left shoulder. Precaution Booklet Issued: Yes (comment) Precaution Comments: handout provided to pt and husband Required Braces or Orthoses: Other Brace/Splint Other Brace/Splint: Sling for left UE. Restrictions Weight Bearing Restrictions: Yes LUE Weight Bearing: Non weight bearing   ADL ADL Upper Body Bathing: Simulated;Moderate assistance Upper Body Bathing Details (indicate cue type and reason): Pt with significant difficulty simulating bathing Lt. axilla.  Husband instructed.  Pt. will require assist at home Where Assessed - Upper Body Bathing: Unsupported;Sitting, bed Lower Body Bathing: Simulated;Minimal assistance Where Assessed - Lower Body Bathing: Sit  to stand from bed Upper Body Dressing: Simulated;Maximal assistance Upper Body Dressing Details (indicate cue type and reason): Pt. with poor problem solving.  Pt instructed in proper technique.  pt. able to thread robe over lt. hand, but unable to thread sleeve elbow.  Pt. kept making same errors, unable to recognize error, nor correct error.  Husband instructed how to assist pt, and was able to safely assist her - he will require practice to ensure learning Where Assessed - Upper Body Dressing: Sitting, bed;Unsupported Lower Body Dressing: Performed;Minimal assistance Lower Body Dressing Details (indicate cue type and reason): difficulty threading Rt. food through pant leg Where Assessed - Lower Body Dressing: Sit to stand from chair;Sit to stand from bed ADL Comments: Pt. and husband instructed in safe technique for bathing and dressing, how to don/doff sling, proper position of Lt. UE, and precautions.  Pt husband able to assit pt. with donning/doffing sling with min A.  Pt unable to verbalize sequence.  Pt. instructed in AROM elbow, wrist and hand Mobility  Bed Mobility Bed Mobility: Yes Supine to Sit: 3: Mod assist;HOB elevated (Comment degrees) Supine to Sit Details (indicate cue type and reason): Assist for UB.  Pt reports she will sleep in recliner Sitting - Scoot to Edge of Bed: 3: Mod assist Transfers Transfers: Yes Sit to Stand: 4: Min assist;With upper extremity assist;From bed;From chair/3-in-1 Stand to Sit: 4: Min assist;With upper extremity assist;To chair/3-in-1 Exercises Shoulder Exercises Elbow Flexion: AROM;Left;10 reps;Standing Elbow Extension: AROM;10 reps;Left;Standing Wrist Flexion: AROM;5 reps;Seated;Left Wrist Extension: AROM;10 reps;Seated;Left Digit Composite Flexion: AROM;Left;10 reps;Seated Composite Extension: AROM;Left;Seated Neck Flexion: AROM;10 reps;Seated Neck Extension: AROM;10 reps;Seated Neck Lateral Flexion - Right: AROM;10 reps;Seated Neck Lateral  Flexion - Left: AROM;10 reps;Seated  End of Session OT - End of Session Activity Tolerance: Patient tolerated treatment well Patient left: in chair;with call bell in reach;with family/visitor present  General Behavior During Session: [facility] for tasks performed Cognition: Impaired Cognitive Impairment: See ADL comments  Cathy Taylor, Ursula Alert M  01/08/2012, 4:24 PM

## 2012-01-08 NOTE — Progress Notes (Signed)
     Subjective:  Patient reports pain as mild.  Otherwise doing well. Denies chest pain or shortness of breath.  Objective:   VITALS:   Filed Vitals:   01/07/12 1326 01/07/12 2106 01/07/12 2207 01/08/12 0647  BP: 98/62 116/67 113/73 166/83  Pulse: 69 70 66 68  Temp: 97.8 F (36.6 C) 98.9 F (37.2 C) 97.1 F (36.2 C) 96.1 F (35.6 C)  TempSrc: Oral Oral  Axillary  Resp: 18 18 18 18   Weight:      SpO2: 95% 95% 92% 95%    Neurologically intact Incision: dressing C/D/I  LABS  Results for orders placed during the hospital encounter of 01/06/12 (from the past 24 hour(s))  CARDIAC PANEL(CRET KIN+CKTOT+MB+TROPI)     Status: Abnormal   Collection Time   01/07/12  9:44 AM      Component Value Range   Total CK 305 (*) 7 - 177 (U/L)   CK, MB 5.5 (*) 0.3 - 4.0 (ng/mL)   Troponin I <0.30  <0.30 (ng/mL)   Relative Index 1.8  0.0 - 2.5   GLUCOSE, CAPILLARY     Status: Normal   Collection Time   01/08/12  7:17 AM      Component Value Range   Glucose-Capillary 79  70 - 99 (mg/dL)    Dg Shoulder Left  1/61/0960  *RADIOLOGY REPORT*  Clinical Data: Shoulder replacement.  LEFT SHOULDER - 2+ VIEW  Comparison: CT scan 01/02/2012.  Findings: The patient has a new left shoulder arthroplasty.  The device is located.  There is no fracture.  Gas in the soft tissues noted.  IMPRESSION: Left shoulder replacement without evidence of complication.  Original Report Authenticated By: Bernadene Bell. Maricela Curet, M.D.    Assessment/Plan: 2 Days Post-Op   Principal Problem:  *Osteoarthritis of left shoulder   Advance diet Up with therapy D/C IV fluids Plan for discharge tomorrow   Tristen Pennino P 01/08/2012, 9:42 AM   Teryl Lucy, MD [phone] pager

## 2012-01-08 NOTE — Discharge Instructions (Signed)
Shoulder Joint Replacement Care After A rehabilitation program is important to the success of the operation. If the surgery is scheduled for the morning, therapy may begin later that day. It is seldom later than the first day following the operation. A physical therapist will start gentle range of motion exercises. During these your arm is put through all its motions. Before you leave the hospital (usually two or three days after surgery), your therapist will show you in how to use a pulley device to help bend and extend your arm. Please read the instructions outlined below. Refer to these instructions for the next few weeks. These discharge instructions provide you with general information on caring for yourself after surgery. Your surgeon may also give you specific instructions. While your treatment has been planned according to the most current medical practices available, unavoidable complications occasionally occur. If you have any problems or questions after discharge, please call your surgeon. HOME CARE INSTRUCTIONS   You may resume normal diet and activities as directed or allowed.   Wear the sling every night for at least the first month, or as instructed by your surgeon.   Do not use your arm to push yourself up in bed or from a chair. This requires too much muscle.   Follow the program of home exercises suggested. Do the exercises 4 to 5 times a day for a month or as directed.   Try not to overuse your shoulder. It is easy to do if this is the first time you have been pain free in a long time. Early overuse of the shoulder may result in later problems.   Do not lift anything heavier than a cup of coffee for the first 6 weeks after surgery.   Ask for help. Your caregiver may be able to suggest a clinic or agency for this if you do not have home support.   Do not participate in contact sports or do any heavy lifting (more than 10 pounds) for at least 6 months, or as directed.   Keep  ice packs (a bag of ice wrapped in a towel) on the surgical area for 15 to 20 minutes, 3 to 4 times per day, for the first 2 days following surgery.   Change dressings if necessary or as directed.   Only take over-the-counter or prescription medicines for pain, discomfort, or fever as directed by your caregiver.   Follow the directions of your surgeon.   Keep appointments as directed.  SEEK MEDICAL CARE IF:  You develop redness, swelling, or increasing pain in the wound.   There is pus coming from wound.   You develop an unexplained oral temperature above 102 F (38.9 C).   There is a bad (foul) smell coming from the wound or dressing.   The edges of the wound break open after sutures or staples have been removed.   You develop increasing pain with movement of the shoulder.  SEEK IMMEDIATE MEDICAL CARE IF:   You develop a rash.   You have a hard time breathing   You develop any reaction or side effects to medications given.  Document Released: 03/28/2005 Document Revised: 08/28/2011 Document Reviewed: 03/29/2008 Exit[employer] Patient Information 2012 [employer]. 

## 2012-01-08 NOTE — Progress Notes (Signed)
Orthopedic Tech Progress Note Patient Details:  Cathy Taylor July 17, 1933 [id number]  Other Ortho Devices Ortho Device Location: sling immobilizer Ortho Device Interventions: Application   Cammer, Mickie Bail 01/08/2012, 9:12 AM

## 2012-01-08 NOTE — Progress Notes (Signed)
UR COMPLETED  

## 2012-01-08 NOTE — Progress Notes (Signed)
CSW received a consult for SNF. PT is recommending home health PT. RNCM is aware and following. CSW is signing off as no further clinical social work needs identified. Please reconsult if a need arises prior to discharge.   Dede Query, MSW, Theresia Majors [phone]

## 2012-01-08 NOTE — Progress Notes (Signed)
Physical Therapy Treatment Patient Details Name: Cathy Taylor MRN: [id number] DOB: August 17, 1933 Today's Date: 01/08/2012  PT Assessment/Plan  PT - Assessment/Plan Comments on Treatment Session: Pt admitted s/p left TSA and is progressing well.  Pt ambulated today with a single point cane on the right side and tolerated increased distance as well as negotiation of stiars.  Pt would benefit from a single point cane for home use. PT Plan: Discharge plan remains appropriate;Frequency remains appropriate;Equipment recommendations need to be updated PT Frequency: Min 3X/week Follow Up Recommendations: Home health PT Equipment Recommended: Gilmer Mor PT Goals  Acute Rehab PT Goals PT Goal Formulation: With patient Time For Goal Achievement: 7 days PT Goal: Supine/Side to Sit - Progress: Progressing toward goal Pt will go Sit to Stand: with modified independence PT Goal: Sit to Stand - Progress: Goal set today Pt will go Stand to Sit: with modified independence PT Goal: Stand to Sit - Progress: Goal set today PT Goal: Ambulate - Progress: Progressing toward goal Pt will Go Up / Down Stairs: 3-5 stairs;with supervision;with least restrictive assistive device PT Goal: Up/Down Stairs - Progress: Goal set today  PT Treatment Precautions/Restrictions  Precautions Precautions: Shoulder Type of Shoulder Precautions: Sling on at all times.  No activity ordered for left shoulder. Precaution Booklet Issued: No Required Braces or Orthoses: Other Brace/Splint Other Brace/Splint: Sling for left UE. Restrictions Weight Bearing Restrictions: Yes LUE Weight Bearing: Non weight bearing Pain 2/10 with session in left shoulder.  Pt repositioned after treatment. Mobility (including Balance) Bed Mobility Bed Mobility: Yes Supine to Sit: 3: Mod assist;HOB elevated (Comment degrees) (30 degrees.) Supine to Sit Details (indicate cue type and reason): Assist to trunk to translate anterior with cues for  sequence. Sitting - Scoot to [place] of Bed: 4: Min assist Sitting - Scoot to [place] of Bed Details (indicate cue type and reason): Assist to shift weight right and left to reciprocally scoot hips anterior. Sit to Sidelying Right: Not Tested (comment) Transfers Transfers: Yes Sit to Stand: 4: Min assist;With upper extremity assist;From bed;From chair/3-in-1 (4 trials.) Sit to Stand Details (indicate cue type and reason): Assist to translate trunk anterior over BOS with cues for hand placement and safety. Stand to Sit: 4: Min assist;With upper extremity assist;To chair/3-in-1 (4 trials.) Stand to Sit Details: Assist to slow descent to surface with cues for safest hand placement. Ambulation/Gait Ambulation/Gait: Yes Ambulation/Gait Assistance: 4: Min assist (Min (guard)) Ambulation/Gait Assistance Details (indicate cue type and reason): Guarding for balance with cues for safe sequence. Ambulation Distance (Feet): 200 Feet Assistive device: Straight cane Gait Pattern: Decreased stride length Stairs: Yes Stairs Assistance: 4: Min assist Stairs Assistance Details (indicate cue type and reason): Assist for balance with cues for sequence facing railing on left going up. Stair Management Technique: One rail Left;Sideways;Step to pattern Number of Stairs: 4  Height of Stairs: 8  (inches) Wheelchair Mobility Wheelchair Mobility: No  Posture/Postural Control Posture/Postural Control: No significant limitations Balance Balance Assessed: No End of Session PT - End of Session Equipment Utilized During Treatment: Gait belt Activity Tolerance: Patient tolerated treatment well Patient left: in chair;with call bell in reach;with family/visitor present Nurse Communication: Mobility status for transfers;Mobility status for ambulation General Behavior During Session: [facility] for tasks performed Cognition: [facility] for tasks performed  Cephus Shelling 01/08/2012, 12:36 PM  01/08/2012 Cephus Shelling, PT,  DPT [phone]

## 2012-01-08 NOTE — Progress Notes (Signed)
Attempted x2 to see pt. Pt refused x2 stating she had nausea.  Not sure, during conversation, that pt realizes how much assist she made need for her basic UE adls.  Will return again as schedule allows. Tory Emerald, [place] [phone]

## 2012-01-09 ENCOUNTER — Encounter (HOSPITAL_COMMUNITY): Payer: Self-pay | Admitting: Orthopedic Surgery

## 2012-01-09 LAB — GLUCOSE, CAPILLARY: Glucose-Capillary: 116 mg/dL — ABNORMAL HIGH (ref 70–99)

## 2012-01-09 MED ORDER — TRAMADOL HCL 50 MG PO TABS: 50.0000 mg | ORAL_TABLET | Freq: Every day | ORAL | Status: AC

## 2012-01-09 NOTE — Discharge Summary (Signed)
Physician Discharge Summary  Patient ID: Cathy Taylor MRN: [id number] DOB/AGE: 10-04-1932 78 y.o.  Admit date: 01/06/2012 Discharge date: 01/09/2012  Admission Diagnoses:  Osteoarthritis of left shoulder  Discharge Diagnoses:  Principal Problem:  *Osteoarthritis of left shoulder   Past Medical History  Diagnosis Date  . Stress fracture of left femur with nonunion 07/08/2011  . PMR (polymyalgia rheumatica)   . GERD (gastroesophageal reflux disease)   . Anxiety   . Hypertension   . Abdominal hernia   . Hypercholesteremia   . Heart murmur   . Chronic back pain greater than 3 months duration   . Bronchiectasis 11/12  . 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 Depression   . Bronchitis     "that's what I'm here for"  . Type II diabetes mellitus   . Umbilical hernia 01/06/12    unrepaired  . Breast cancer 1973    right  . Arthritis     "everywhere  . Osteoarthritis of left shoulder 01/06/2012    Surgeries: Procedure(s): TOTAL SHOULDER ARTHROPLASTY on 01/06/2012   Consultants (if any):    Discharged Condition: Improved  Hospital Course: Cathy Taylor is an 78 y.o. female who was admitted 01/06/2012 with a diagnosis of Osteoarthritis of left shoulder and went to the operating room on 01/06/2012 and underwent the above named procedures.    She was given perioperative antibiotics:  Anti-infectives     Start     Dose/Rate Route Frequency Ordered Stop   01/06/12 2000   ceFAZolin (ANCEF) IVPB 1 g/50 mL premix        1 g 100 mL/hr over 30 Minutes Intravenous Every 6 hours 01/06/12 1822 01/07/12 0934   01/05/12 1422   ceFAZolin (ANCEF) IVPB 1 g/50 mL premix        1 g 100 mL/hr over 30 Minutes Intravenous 60 min pre-op 01/05/12 1422 01/06/12 1335        .  She was given sequential compression devices, early ambulation, and chemoprophylaxis for DVT prophylaxis.  She benefited maximally from the hospital stay and there were no complications.    Recent vital signs:  Filed  Vitals:   01/09/12 0522  BP: 111/74  Pulse: 94  Temp: 97.7 F (36.5 C)  Resp: 18    Recent laboratory studies:  Lab Results  Component Value Date   HGB 11.0* 01/07/2012   HGB 11.5* 01/06/2012   HGB 12.9 01/05/2012   Lab Results  Component Value Date   WBC 9.8 01/07/2012   PLT 262 01/07/2012   Lab Results  Component Value Date   INR 0.89 01/05/2012   Lab Results  Component Value Date   NA 134* 01/07/2012   K 4.6 01/07/2012   CL 98 01/07/2012   CO2 24 01/07/2012   BUN 11 01/07/2012   CREATININE 0.62 01/07/2012   GLUCOSE 141* 01/07/2012    Discharge Medications:   Medication List  As of 01/09/2012  6:43 AM   STOP taking these medications         celecoxib 200 MG capsule         TAKE these medications         acetaminophen 500 MG tablet   Commonly known as: TYLENOL   Take 500 mg by mouth every 6 (six) hours as needed. For pain      albuterol 108 (90 BASE) MCG/ACT inhaler   Commonly known as: PROVENTIL HFA;VENTOLIN HFA   Inhale 2 puffs into the lungs every  6 (six) hours as needed. Shortness of breath      aspirin EC 81 MG tablet   Take 81 mg by mouth daily.      benazepril-hydrochlorthiazide 20-12.5 MG per tablet   Commonly known as: LOTENSIN HCT   Take 1 tablet by mouth daily.      calcium citrate-vitamin D 315-200 MG-UNIT per tablet   Commonly known as: CITRACAL+D   Take 1 tablet by mouth daily.      clorazepate 3.75 MG tablet   Commonly known as: TRANXENE   Take 3.75 mg by mouth 2 (two) times daily as needed. For anxiety      clotrimazole-betamethasone cream   Commonly known as: LOTRISONE   Apply 1 application topically 2 (two) times daily as needed. rash      diltiazem 240 MG 24 hr capsule   Commonly known as: CARDIZEM CD   Take 1 tablet by mouth daily.      DULoxetine 30 MG capsule   Commonly known as: CYMBALTA   Take 30 mg by mouth daily.      esomeprazole 40 MG capsule   Commonly known as: NEXIUM   Take 40 mg by mouth daily.      ezetimibe 10  MG tablet   Commonly known as: ZETIA   Take 10 mg by mouth daily.      glimepiride 1 MG tablet   Commonly known as: AMARYL   Take 1 mg by mouth daily.      metFORMIN 500 MG tablet   Commonly known as: GLUCOPHAGE   Take 500 mg by mouth See admin instructions. Take two tablets in the morning and one tablet at night      multivitamins ther. w/minerals Tabs   Take 1 tablet by mouth daily.      oxyCODONE-acetaminophen 5-325 MG per tablet   Commonly known as: PERCOCET   Take 1-2 tablets by mouth every 6 (six) hours as needed for pain.      predniSONE 5 MG tablet   Commonly known as: DELTASONE   Take 2.5 mg by mouth daily.      simvastatin 40 MG tablet   Commonly known as: ZOCOR   Take 40 mg by mouth at bedtime.      traMADol 50 MG tablet   Commonly known as: ULTRAM   Take 50-100 mg by mouth at bedtime. Takes two tablets if pain is severe.      traMADol 50 MG tablet   Commonly known as: ULTRAM   Take 1-2 tablets (50-100 mg total) by mouth at bedtime.      VOLTAREN 1 % Gel   Generic drug: diclofenac sodium   Apply 1 application topically 4 (four) times daily as needed. Pain in shoulder            Diagnostic Studies: Ct Shoulder Left Wo Contrast  01/02/2012  *RADIOLOGY REPORT*  Clinical Data: The left shoulder pain.  Severe osteoarthritis of the left shoulder joint.  CT OF THE LEFT SHOULDER WITHOUT CONTRAST  Technique:  Multidetector CT imaging was performed according to the standard protocol. Multiplanar CT image reconstructions were also generated.  Comparison: None.  Findings: The patient has severe osteoarthritis of the left glenohumeral joint with erosion and deformity of the glenoid anterior osteophytes in the glenoid, several calcified loose bodies within the joint, deformity and extensive osteophytes of the humeral head, and atrophy of the supraspinatus and infraspinatus muscles although the tendons appear to be intact.  Minimal degenerative changes of the  acromioclavicular joint.  IMPRESSION:  1.  Severe osteoarthritis of the left glenohumeral joint with erosion and osteophyte formation on the glenoid primarily anteriorly. 2.  Prominent osteophytes and deformity of the humeral head.  Original Report Authenticated By: Gwynn Burly, M.D.   Dg Shoulder Left  01/06/2012  *RADIOLOGY REPORT*  Clinical Data: Shoulder replacement.  LEFT SHOULDER - 2+ VIEW  Comparison: CT scan 01/02/2012.  Findings: The patient has a new left shoulder arthroplasty.  The device is located.  There is no fracture.  Gas in the soft tissues noted.  IMPRESSION: Left shoulder replacement without evidence of complication.  Original Report Authenticated By: Bernadene Bell. Maricela Curet, M.D.    Disposition: 06-Home-Health Care Svc  Discharge Orders    Future Orders Please Complete By Expires   Diet general      Call MD / Call 911      Comments:   If you experience chest pain or shortness of breath, CALL 911 and be transported to the hospital emergency room.  If you develope a fever above 101 F, pus (white drainage) or increased drainage or redness at the wound, or calf pain, call your surgeon's office.   Discharge instructions      Comments:   Change dressing in 3 days and reapply fresh dressing, unless you have a splint (half cast).  If you have a splint/cast, just leave in place until your follow-up appointment.    Keep wounds dry for 3 weeks.  Leave steri-strips in place on skin.  Do not apply lotion or anything to the wound.   Constipation Prevention      Comments:   Drink plenty of fluids.  Prune juice may be helpful.  You may use a stool softener, such as Colace (over the counter) 100 mg twice a day.  Use MiraLax (over the counter) for constipation as needed.   Weight Bearing as taught in Physical Therapy      Comments:   Use a walker or crutches as instructed.      Follow-up Information    Follow up with Corleone Biegler P, MD in 2 weeks.   Contact information:   [facility] [address], [address] [place] [address] [phone]           Signed: Eulas Post 01/09/2012, 6:43 AM

## 2012-01-09 NOTE — Progress Notes (Signed)
CARE MANAGEMENT NOTE 01/09/2012  Patient:  Cathy Taylor, Cathy Taylor   Account Number:  [id number]  Date Initiated:  01/09/2012  Documentation initiated by:  Vance Peper  Subjective/Objective Assessment:   s/p left total shoulder arthroplasty     Action/Plan:   No home health needs identified.   Anticipated DC Date:  01/09/2012   Anticipated DC Plan:  HOME/SELF CARE      DC Planning Services  CM consult      PAC Choice  NA   Choice offered to / List presented to:  NA   DME arranged  NA      DME agency  NA     HH arranged  NA      HH agency  NA   Status of service:  Completed, signed off  Discharge Disposition:  HOME/SELF CARE

## 2012-01-09 NOTE — Progress Notes (Signed)
Occupational Therapy Treatment Patient Details Name: KARREN NEWLAND MRN: [id number] DOB: 1933-04-23 Today's Date: 01/09/2012 Time: 7829-5621 OT Time Calculation (min): 56 min  OT Assessment / Plan / Recommendation Comments on Treatment Session Pt. continues with poor judgement re: need for assistance.  Pt is high risk for fall and injury.  She has been informed of this, as well as recommendation for 24 hour supervision/assist, but refuses to contact family for assist, and refuses to consider SNF.  Recommend HHOT, HHaide and 24 hour assist    Follow Up Recommendations  Home health OT;Supervision/Assistance - 24 hour    Equipment Recommendations  None recommended by OT    Frequency Min 2X/week   Plan Discharge plan remains appropriate    Precautions / Restrictions Precautions Precautions: Shoulder Type of Shoulder Precautions: Sling on at all times.  No activity ordered for left shoulder. Precaution Booklet Issued: Yes (comment) Precaution Comments: handout provided to pt and husband Required Braces or Orthoses: Other Brace/Splint Other Brace/Splint: Sling for left UE. Restrictions Weight Bearing Restrictions: Yes LUE Weight Bearing: Non weight bearing   Pertinent Vitals/Pain     ADL  Grooming: Performed;Wash/dry hands;Wash/dry face;Teeth care;Supervision/safety Where Assessed - Grooming: Standing at sink Upper Body Bathing: Simulated;Moderate assistance Where Assessed - Upper Body Bathing: Sitting, bed;Unsupported Lower Body Bathing: Simulated;Minimal assistance Where Assessed - Lower Body Bathing: Sit to stand from bed Upper Body Dressing: Performed;Maximal assistance Where Assessed - Upper Body Dressing: Sitting, bed;Unsupported Lower Body Dressing: Performed (min guard assist) Where Assessed - Lower Body Dressing: Sit to stand from bed Toilet Transfer: Performed;Minimal assistance Toilet Transfer Equipment: Comfort height toilet;Grab bars Toileting - Clothing  Manipulation: Simulated (min guard assist) Where Assessed - Toileting Clothing Manipulation: Standing Toileting - Hygiene: Performed;Supervision/safety Where Assessed - Toileting Hygiene: Sit on 3-in-1 or toilet Equipment Used: Other (comment) (sling) Ambulation Related to ADLs: Pt ambulates with SPC and supervision ADL Comments: Pt. continues to require max cues for UB dressing - poor carry over of information, and poor problem solving.  She is able to doff sling with supervision and min cues, but requires max A to don sling.  Husband is able to perform this with supervision.   Pt. performed a.m. ADL, and she sent husband out of the room.  Pt. still no awareness of difficulties and need for assistance.  Pt. also required min A for sit to stand consistently. Pt. very fatigued.  Pt. finally acknowledged that she would need assist for ADLs when she couldn't don robe/gown independently, and when asked, stated her husband would have to help, then stated "where did he go?"  She had no recollection that she had instructed him to leave the room.  Long discussion with pt. re: my concerns for her safety including need for 24 hour supervision/assist.  Assist when she is ambulating.  Assist with ADLs, and that she is high risk for falling.  Pt. refuses to ask family for assistance.  Discussed SNF level rehab with her, and she continues to state she wants to try it at home.  Pt is very irritable with husband when he was assisting her.      OT Goals ADL Goals ADL Goal: Grooming - Progress: Progressing toward goals ADL Goal: Upper Body Bathing - Progress: Progressing toward goals ADL Goal: Lower Body Bathing - Progress: Progressing toward goals ADL Goal: Upper Body Dressing - Progress: Not progressing ADL Goal: Lower Body Dressing - Progress: Progressing toward goals ADL Goal: Toileting - Clothing Manipulation - Progress: Progressing toward goals ADL  Goal: Toileting - Hygiene - Progress: Progressing toward  goals ADL Goal: Additional Goal #1 - Progress: Progressing toward goals ADL Goal: Additional Goal #2 - Progress: Progressing toward goals  Visit Information  Last OT Received On: 01/09/12    Subjective Data      Prior Functioning       Cognition  Overall Cognitive Status: Impaired Area of Impairment: Memory;Safety/judgement;Awareness of errors;Awareness of deficits;Problem solving Arousal/Alertness: Awake/alert Orientation Level: Oriented X4 / Intact Behavior During Session: WFL for tasks performed Memory Deficits: see ADL comments Safety/Judgement: Decreased safety judgement for tasks assessed;Decreased awareness of need for assistance Awareness of Errors: Assistance required to correct errors made;Assistance required to identify errors made Problem Solving: see ADL comments Cognition - Other Comments: see ADL comments    Mobility Transfers Transfers: Sit to Stand;Stand to Sit Transfers: Yes Sit to Stand: 4: Min assist;With upper extremity assist;From bed;From chair/3-in-1 Stand to Sit: 4: Min assist;With upper extremity assist;To chair/3-in-1 Details for Transfer Assistance: Husband did volunteer at end of session that pt has lift chair at home.  Instructed husband and pt. to use 3-in-1 over commode at home   Exercises Shoulder Exercises Elbow Flexion: PROM;Left;10 reps;Seated Elbow Extension: AROM;10 reps;Left;Seated  Balance Balance Balance Assessed: Yes Dynamic Standing Balance Dynamic Standing - Level of Assistance:  (min guard assist)  End of Session OT - End of Session Activity Tolerance: Patient tolerated treatment well Patient left: in chair;with call bell/phone within reach;with family/visitor present Nurse Communication: Other (comment) (need for [facility])   Shankar Silber M 01/09/2012, 1:52 PM

## 2012-04-09 ENCOUNTER — Encounter: Payer: Self-pay | Admitting: Pulmonary Disease

## 2012-04-09 ENCOUNTER — Ambulatory Visit (INDEPENDENT_AMBULATORY_CARE_PROVIDER_SITE_OTHER)
Admission: RE | Admit: 2012-04-09 | Discharge: 2012-04-09 | Disposition: A | Discharge status: Home / Self Care | Payer: Medicare Other | Source: Ambulatory Visit | Attending: Pulmonary Disease | Admitting: Pulmonary Disease

## 2012-04-09 ENCOUNTER — Ambulatory Visit (INDEPENDENT_AMBULATORY_CARE_PROVIDER_SITE_OTHER): Payer: Medicare Other | Admitting: Pulmonary Disease

## 2012-04-09 VITALS — BP 122/72 | HR 74 | Temp 98.4°F | Ht 63.5 in | Wt 174.0 lb

## 2012-04-09 DIAGNOSIS — J479 Bronchiectasis, uncomplicated: Secondary | ICD-10-CM

## 2012-04-09 DIAGNOSIS — J9819 Other pulmonary collapse: Secondary | ICD-10-CM

## 2012-04-09 NOTE — Assessment & Plan Note (Signed)
Noted on CT dec' 12 No flares so far this year  Discussed signs & symptoms of bronchitis - she will call PCP early if this recurs Can FU with [place] prn only CXR to today to fu LLL atx - Xr lt shoulder from 4/13 reviewed

## 2012-04-09 NOTE — Patient Instructions (Addendum)
CXR today Call your doctor early if you get signs or symptoms of bronchitis

## 2012-04-09 NOTE — Assessment & Plan Note (Signed)
resolved 

## 2012-04-09 NOTE — Progress Notes (Signed)
  Subjective:    Patient ID: Cathy Taylor, female    DOB: [birth year], 78 y.o.   MRN: [id number]  HPI PCP - [facility]  [facility] - Donald Siva - [facility] Mood   78 yo female with hx hypertension, DM, polymyalgia rheumatica (on prednisone), GERD who was recently treated as an outpatient with bronchitis. She was treated with a Z-Pak. However her symptoms continued to worsen and she presented to her primary care office on 12/19 with continued complaints of cough, congestion, shortness of breath and chest pain. In the office she was hypoxic despite nebulizer treatment and she was sent to [facility] for direct admission by Dr. Nehemiah Settle. On December 20 CT scan showed left lower lobe collapse.  LLL atx/ collapse -- no underlying mass/ lesion seen on CT. Likely d/t mucous plugging in setting purulent bronchitis v ?CAP. Failed outpt rx. Pt in no distress, looks good clinically.  Took t levaquin x 10 ds  FU CXR 1/13 showed improved aeration of LLL  04/09/2012  She is on prednisone for PMR Cathy Taylor) Does not need albuterol MDI Lt shoulder surgery - in rehab    Review of Systems Patient denies significant dyspnea,cough, hemoptysis,  chest pain, palpitations, pedal edema, orthopnea, paroxysmal nocturnal dyspnea, lightheadedness, nausea, vomiting, abdominal or  leg pains      Objective:   Physical Exam  Gen. Pleasant, well-nourished, in no distress ENT - no lesions, no post nasal drip Neck: No JVD, no thyromegaly, no carotid bruits Lungs: no use of accessory muscles, no dullness to percussion, clear without rales or rhonchi  Cardiovascular: Rhythm regular, heart sounds  normal, no murmurs or gallops, no peripheral edema Musculoskeletal: No deformities, no cyanosis or clubbing         Assessment & Plan:

## 2012-05-10 ENCOUNTER — Other Ambulatory Visit: Payer: Self-pay | Admitting: Internal Medicine

## 2012-05-10 DIAGNOSIS — M542 Cervicalgia: Secondary | ICD-10-CM

## 2012-05-10 DIAGNOSIS — M79603 Pain in arm, unspecified: Secondary | ICD-10-CM

## 2012-05-12 ENCOUNTER — Other Ambulatory Visit: Payer: Medicare Other

## 2012-05-13 ENCOUNTER — Ambulatory Visit
Admission: RE | Admit: 2012-05-13 | Discharge: 2012-05-13 | Disposition: A | Discharge status: Home / Self Care | Payer: Medicare Other | Source: Ambulatory Visit | Attending: Internal Medicine | Admitting: Internal Medicine

## 2012-05-13 DIAGNOSIS — M79603 Pain in arm, unspecified: Secondary | ICD-10-CM

## 2012-05-13 DIAGNOSIS — M542 Cervicalgia: Secondary | ICD-10-CM

## 2012-06-21 ENCOUNTER — Other Ambulatory Visit: Payer: Self-pay | Admitting: Internal Medicine

## 2012-06-21 DIAGNOSIS — Z1231 Encounter for screening mammogram for malignant neoplasm of breast: Secondary | ICD-10-CM

## 2012-06-21 DIAGNOSIS — Z9011 Acquired absence of right breast and nipple: Secondary | ICD-10-CM

## 2012-06-23 ENCOUNTER — Ambulatory Visit
Admission: RE | Admit: 2012-06-23 | Discharge: 2012-06-23 | Disposition: A | Discharge status: Home / Self Care | Payer: Medicare Other | Source: Ambulatory Visit | Attending: Internal Medicine | Admitting: Internal Medicine

## 2012-06-23 DIAGNOSIS — Z1231 Encounter for screening mammogram for malignant neoplasm of breast: Secondary | ICD-10-CM

## 2012-06-23 DIAGNOSIS — Z9011 Acquired absence of right breast and nipple: Secondary | ICD-10-CM

## 2013-09-28 ENCOUNTER — Ambulatory Visit
Admission: RE | Admit: 2013-09-28 | Discharge: 2013-09-28 | Disposition: A | Discharge status: Home / Self Care | Payer: Medicare Other | Source: Ambulatory Visit | Attending: Internal Medicine | Admitting: Internal Medicine

## 2013-09-28 ENCOUNTER — Other Ambulatory Visit: Payer: Self-pay | Admitting: Internal Medicine

## 2013-09-28 DIAGNOSIS — M25551 Pain in right hip: Secondary | ICD-10-CM

## 2014-04-27 ENCOUNTER — Other Ambulatory Visit: Payer: Self-pay

## 2014-04-27 DIAGNOSIS — Z9011 Acquired absence of right breast and nipple: Secondary | ICD-10-CM

## 2014-04-27 DIAGNOSIS — Z1231 Encounter for screening mammogram for malignant neoplasm of breast: Secondary | ICD-10-CM

## 2014-05-04 ENCOUNTER — Ambulatory Visit
Admission: RE | Admit: 2014-05-04 | Discharge: 2014-05-04 | Disposition: A | Discharge status: Home / Self Care | Payer: Medicare Other | Source: Ambulatory Visit

## 2014-05-04 DIAGNOSIS — Z1231 Encounter for screening mammogram for malignant neoplasm of breast: Secondary | ICD-10-CM

## 2014-05-04 DIAGNOSIS — Z9011 Acquired absence of right breast and nipple: Secondary | ICD-10-CM

## 2014-05-30 ENCOUNTER — Ambulatory Visit (INDEPENDENT_AMBULATORY_CARE_PROVIDER_SITE_OTHER): Payer: Medicare Other | Admitting: Neurology

## 2014-05-30 ENCOUNTER — Encounter: Payer: Self-pay | Admitting: Neurology

## 2014-05-30 VITALS — BP 122/70 | HR 74 | Resp 16 | Ht 61.0 in | Wt 159.0 lb

## 2014-05-30 DIAGNOSIS — R209 Unspecified disturbances of skin sensation: Secondary | ICD-10-CM

## 2014-05-30 DIAGNOSIS — R413 Other amnesia: Secondary | ICD-10-CM

## 2014-05-30 DIAGNOSIS — R200 Anesthesia of skin: Secondary | ICD-10-CM

## 2014-05-30 NOTE — Progress Notes (Signed)
NEUROLOGY CONSULTATION NOTE  Cathy Taylor MRN: [id number] DOB: Sep 03, 1933  Referring provider: Dr. Seward Carol Primary care provider: Dr. Seward Carol  Reason for consult:  Memory loss  Dear Dr Delfina Redwood:  Thank you for your kind referral of Cathy Taylor for consultation of the above symptoms. Although her history is well known to you, please allow me to reiterate it for the purpose of our medical record. Records and images were personally reviewed where available.  HISTORY OF PRESENT ILLNESS: This is a pleasant 80 year old right-handed woman with a history of hypertension, hyperlipidemia, diabetes, polymyalgia rheumatica, presenting for evaluation of memory loss as recommended by her PCP.  When asked about her memory, she states that her doctor had noticed it more.  She lives with her husband and is his primary caregiver.  She drives and denies getting lost or disoriented. She has asked her daughter to help with bills a few months ago, because she could not get her checkbook straight and "things kept coming in." Her daughter comes to put both her and her husband's medications in a pillbox. She makes sure her husband takes his medication, and then forgets to take hers.  She states that her arms and shoulders hurt because of helping her husband after a fall, but also endorses that she has fallen several times as well. Her last fall was a few weeks ago. She states she has been to the ER a few times. She has a bruise under her left eye and states she fell down the stairs. She has occasional headaches and feels a little off balance. She denies any diplopia, dysarthria, dysphagia, focal numbness/tingling/weakness, bowel/bladder incontinence. She has shoulder pain and neck/back pain from arthritis. She reports her mood is "pretty good." She denies any hallucinations, stating her husband has the hallucinations. She feels her sleep is good.  There is no family history of memory problems.    On review of  notes, she scored 23/30 on MMSE with her PCP on 05/04/14. Previously, she scored 29/30 (?date).  She was noted to be lethargic and fatigued that day because she did not sleep well and took pain medication.  Laboratory Data: 04/21/14: HbA1c 6.4, vitamin B12 252, CBC and CMP unremarkable, lipid panel normal.  PAST MEDICAL HISTORY: Past Medical History  Diagnosis Date  . Stress fracture of left femur with nonunion 07/08/2011  . PMR (polymyalgia rheumatica)   . GERD (gastroesophageal reflux disease)   . Anxiety   . Hypertension   . Abdominal hernia   . Hypercholesteremia   . Heart murmur   . Chronic back pain greater than 3 months duration   . Bronchiectasis 11/12  . 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 Depression   . Bronchitis     "that's what I'm here for"  . Type II diabetes mellitus   . Umbilical hernia 0/99/83    unrepaired  . Breast cancer 1973    right  . Arthritis     "everywhere  . Osteoarthritis of left shoulder 01/06/2012    PAST SURGICAL HISTORY: Past Surgical History  Procedure Laterality Date  . Tubal ligation    . Cataract extraction w/ intraocular lens implant  07/2011    left  . Shoulder arthroscopy  06/2000    left  . Femur surgery  07/07/2011    left rod placed  . Total shoulder arthroplasty  01/06/12    left  . Breast biopsy  1981    left  .  Mastectomy  1973    right  . Total shoulder arthroplasty  01/06/2012    Procedure: TOTAL SHOULDER ARTHROPLASTY;  Surgeon: Johnny Bridge, MD;  Location: [place];  Service: Orthopedics;  Laterality: Left;    MEDICATIONS: Current Outpatient Prescriptions on File Prior to Visit  Medication Sig Dispense Refill  . acetaminophen (TYLENOL) 500 MG tablet Take 500 mg by mouth every 6 (six) hours as needed. For pain       . aspirin EC 81 MG tablet Take 81 mg by mouth daily.        . benazepril-hydrochlorthiazide (LOTENSIN HCT) 20-12.5 MG per tablet Take 1 tablet by mouth daily.        . diclofenac sodium (VOLTAREN) 1 %  GEL Apply 1 application topically 4 (four) times daily as needed. Pain in shoulder      . diltiazem (CARDIZEM CD) 240 MG 24 hr capsule Take 1 tablet by mouth daily.      . DULoxetine (CYMBALTA) 30 MG capsule Take 30 mg by mouth daily.        Marland Kitchen esomeprazole (NEXIUM) 40 MG capsule Take 40 mg by mouth daily.        Marland Kitchen glimepiride (AMARYL) 1 MG tablet Take 1 mg by mouth daily.        . metFORMIN (GLUCOPHAGE) 500 MG tablet Take 500 mg by mouth See admin instructions. Take two tablets in the morning and one tablet at night      . simvastatin (ZOCOR) 40 MG tablet Take 40 mg by mouth at bedtime.        Marland Kitchen albuterol (PROVENTIL HFA;VENTOLIN HFA) 108 (90 BASE) MCG/ACT inhaler Inhale 2 puffs into the lungs every 6 (six) hours as needed. Shortness of breath      . calcium citrate-vitamin D (CITRACAL+D) 315-200 MG-UNIT per tablet Take 1 tablet by mouth daily.      . clorazepate (TRANXENE) 3.75 MG tablet Take 3.75 mg by mouth 2 (two) times daily as needed. For anxiety       . clotrimazole-betamethasone (LOTRISONE) cream Apply 1 application topically 2 (two) times daily as needed. rash      . ezetimibe (ZETIA) 10 MG tablet Take 10 mg by mouth daily.        . Multiple Vitamins-Minerals (MULTIVITAMINS THER. W/MINERALS) TABS Take 1 tablet by mouth daily.        . predniSONE (DELTASONE) 5 MG tablet Take 2.5 mg by mouth daily.        No current facility-administered medications on file prior to visit.    ALLERGIES: No Known Allergies  FAMILY HISTORY: No family history on file.  SOCIAL HISTORY: History   Social History  . Marital Status: Married    Spouse Name: N/A    Number of Children: N/A  . Years of Education: N/A   Occupational History  . Not on file.   Social History Main Topics  . Smoking status: Never Smoker   . Smokeless tobacco: Former Systems developer    Types: Snuff    Quit date: 09/22/1948     Comment: "just tried smoking; didn't smoke to amount to nothing"  . Alcohol Use: No  . Drug Use: No  .  Sexual Activity: Not Currently   Other Topics Concern  . Not on file   Social History Narrative  . No narrative on file    REVIEW OF SYSTEMS: Constitutional: No fevers, chills, or sweats, no generalized fatigue, change in appetite Eyes: No visual changes, double vision, eye pain Ear,  nose and throat: No hearing loss, ear pain, nasal congestion, sore throat Cardiovascular: No chest pain, palpitations Respiratory:  No shortness of breath at rest or with exertion, wheezes GastrointestinaI: No nausea, vomiting, diarrhea, abdominal pain, fecal incontinence Genitourinary:  No dysuria, urinary retention or frequency Musculoskeletal:  + neck pain, back pain Integumentary: No rash, pruritus, skin lesions Neurological: as above Psychiatric: No depression, insomnia, anxiety Endocrine: No palpitations, fatigue, diaphoresis, mood swings, change in appetite, change in weight, increased thirst Hematologic/Lymphatic:  No anemia, purpura, petechiae. Allergic/Immunologic: no itchy/runny eyes, nasal congestion, recent allergic reactions, rashes  PHYSICAL EXAM: Filed Vitals:   05/30/14 1453  BP: 122/70  Pulse: 74  Resp: 16   General: No acute distress Head:  Normocephalic/atraumatic Eyes: Fundoscopic exam shows bilateral sharp discs, no vessel changes, exudates, or hemorrhages Neck: supple, no paraspinal tenderness, full range of motion Back: No paraspinal tenderness Heart: regular rate and rhythm Lungs: Clear to auscultation bilaterally. Vascular: No carotid bruits. Skin/Extremities: No rash, no edema Neurological Exam: Mental status: alert and oriented to person, place, and time, no dysarthria or aphasia, Fund of knowledge is appropriate.  Recent and remote memory are intact.  Attention and concentration are normal.    Able to name objects and repeat phrases. Montreal Cognitive Assessment  06/01/2014  Visuospatial/ Executive (0/5) 4  Naming (0/3) 3  Attention: Read list of digits (0/2) 2    Attention: Read list of letters (0/1) 1  Attention: Serial 7 subtraction starting at 100 (0/3) 3  Language: Repeat phrase (0/2) 2  Language : Fluency (0/1) 0  Abstraction (0/2) 2  Delayed Recall (0/5) 2  Orientation (0/6) 6  Total 25  Adjusted Score (based on education) 25   Cranial nerves: CN I: not tested CN II: pupils equal, round and reactive to light, visual fields intact, fundi unremarkable. CN III, IV, VI:  full range of motion, no nystagmus, no ptosis CN V: decreased cold on left V1-3 CN VII: no facial asymmetry CN VIII: hearing intact to finger rub CN IX, X: gag intact, uvula midline CN XI: sternocleidomastoid and trapezius muscles intact CN XII: tongue midline Bulk & Tone: normal, no cogwheeling, no fasciculations. Motor: 5/5 throughout with no pronator drift. Sensation: decreased cold on left UE and LE, decreased pin on right UE, left LE.  Intact to vibration and joint position sense.  Romberg test negative Deep Tendon Reflexes: +2 both UE, brisk +3 right patella, +1 left patella, absent ankle jerks bilaterally, no ankle clonus Plantar responses: downgoing bilaterally Cerebellar: no incoordination on finger to nose, heel to shin. No dysdiadochokinesia Gait: slow and cautious, no ataxia, good arm swing, unable to tandem walk Tremor: none  IMPRESSION: This is a pleasant 80 year old right-handed woman with a vascular risk factors including hypertension, hyperlipidemia, diabetes, presenting with worsening memory loss with note of worsening MMSE during recent PCP visit.  Her MOCA score today is 25/30, indicating mild cognitive impairment.  Her neurological exam shows decreased sensation mostly over the left side, and asymmetric reflexes. MRI brain without contrast will be ordered to assess for underlying structural abnormality and vascular load. We discussed the option for starting cholinesterase inhibitor such as Aricept, expectations for the medication and side effects. She  would like to hold off for now. She feels that she is under a lot of stress with taking care of her husband, we also discussed pseudodementia and effects of mood on memory.  We discussed getting more help at home, which she will speak to her PCP  about.  She will follow-up in 4 months.  Thank you for allowing me to participate in the care of this patient. Please do not hesitate to call for any questions or concerns.   Ellouise Newer, M.D.  CC: Dr. Delfina Redwood

## 2014-05-30 NOTE — Patient Instructions (Signed)
1. MRI brain without contrast 2. Continue all your medications 3. Discuss getting social work and home health with your primary care doctor

## 2014-06-01 ENCOUNTER — Encounter: Payer: Self-pay | Admitting: Neurology

## 2014-06-01 DIAGNOSIS — R200 Anesthesia of skin: Secondary | ICD-10-CM | POA: Insufficient documentation

## 2014-06-01 DIAGNOSIS — R413 Other amnesia: Secondary | ICD-10-CM | POA: Insufficient documentation

## 2014-06-06 ENCOUNTER — Ambulatory Visit (HOSPITAL_COMMUNITY)
Admission: RE | Admit: 2014-06-06 | Discharge: 2014-06-06 | Disposition: A | Discharge status: Home / Self Care | Payer: Medicare Other | Source: Ambulatory Visit | Attending: Neurology | Admitting: Neurology

## 2014-06-06 DIAGNOSIS — R413 Other amnesia: Secondary | ICD-10-CM | POA: Insufficient documentation

## 2014-09-29 ENCOUNTER — Telehealth: Payer: Self-pay | Admitting: Neurology

## 2014-09-29 ENCOUNTER — Ambulatory Visit: Payer: Medicare Other | Admitting: Neurology

## 2014-09-29 NOTE — Telephone Encounter (Signed)
Pt is sick and could not make it  To her appt 09-29-14  And resch appt to 11-13-14

## 2014-10-02 NOTE — Telephone Encounter (Signed)
appt marked as no show b/c less than 24 hours prior notification was given but a no show letter will not be sent / Sherri S.

## 2014-11-13 ENCOUNTER — Encounter: Payer: Self-pay | Admitting: Neurology

## 2014-11-13 ENCOUNTER — Ambulatory Visit (INDEPENDENT_AMBULATORY_CARE_PROVIDER_SITE_OTHER): Payer: Medicare Other | Admitting: Neurology

## 2014-11-13 VITALS — BP 132/78 | HR 81 | Resp 16 | Ht 61.0 in | Wt 161.0 lb

## 2014-11-13 DIAGNOSIS — R413 Other amnesia: Secondary | ICD-10-CM

## 2014-11-13 NOTE — Patient Instructions (Signed)
1. Physical exercise and brain stimulation exercises are important for brain health 2. Continue to monitor need for help with assisting your husband, home safety for both of you is important 3. Follow-up in 1 year

## 2014-11-13 NOTE — Progress Notes (Signed)
NEUROLOGY FOLLOW UP OFFICE NOTE  Cathy Taylor [id number]  HISTORY OF PRESENT ILLNESS: I had the pleasure of seeing Cathy Taylor in follow-up in the neurology clinic on 11/13/2014.  The patient was last seen 5 months ago for worsening memory. MOCA score at that time was 25/30. Records and images were personally reviewed where available.  I personally reviewed MRI brain without contrast which showed diffuse atrophy and moderate chronic microvascular change. On her last visit, we discussed the effects of mood/stress on memory, and she had endorsed a lot of stress taking care of her husband. She did not want to start Aricept or any anti-depressant at that time. She reports that she continues to be under more stress. She feels her memory is "not too bad, not worsening." She continues to drive without getting lost. She denies any recent falls. She has arthritis, with right neck and shoulder pain, radiating to her ear. She denies any headaches, dizziness, diplopia. She has tingling in her fingers and toes.  HPI: This is a pleasant 81 yo RH woman with a history of hypertension, hyperlipidemia, diabetes, polymyalgia rheumatica, who presented with memory loss as recommended by her PCP. When asked about her memory, she states that her doctor had noticed it more. She lives with her husband and is his primary caregiver. She drives and denies getting lost or disoriented. She has asked her daughter to help with bills a few months ago, because she could not get her checkbook straight and "things kept coming in." Her daughter comes to put both her and her husband's medications in a pillbox. She makes sure her husband takes his medication, and then forgets to take hers. She states that her arms and shoulders hurt because of helping her husband after a fall, but also endorses that she has fallen several times as well. She denies any hallucinations, stating her husband has the hallucinations. She feels her sleep is good.  There is no family history of memory problems.   On review of notes, she scored 23/30 on MMSE with her PCP on 05/04/14. Previously, she scored 29/30 (?date). She was noted to be lethargic and fatigued that day because she did not sleep well and took pain medication.  Laboratory Data: 04/21/14: HbA1c 6.4, vitamin B12 252, CBC and CMP unremarkable, lipid panel normal.  PAST MEDICAL HISTORY: Past Medical History  Diagnosis Date  . Stress fracture of left femur with nonunion 07/08/2011  . PMR (polymyalgia rheumatica)   . GERD (gastroesophageal reflux disease)   . Anxiety   . Hypertension   . Abdominal hernia   . Hypercholesteremia   . Heart murmur   . Chronic back pain greater than 3 months duration   . Bronchiectasis 11/12  . 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 Depression   . Bronchitis     "that's what I'm here for"  . Type II diabetes mellitus   . Umbilical hernia 2/45/80    unrepaired  . Breast cancer 1973    right  . Arthritis     "everywhere  . Osteoarthritis of left shoulder 01/06/2012    MEDICATIONS: Current Outpatient Prescriptions on File Prior to Visit  Medication Sig Dispense Refill  . acetaminophen (TYLENOL) 500 MG tablet Take 500 mg by mouth every 6 (six) hours as needed. For pain     . benazepril-hydrochlorthiazide (LOTENSIN HCT) 20-12.5 MG per tablet Take 1 tablet by mouth daily.      . diclofenac sodium (VOLTAREN) 1 % GEL  Apply 1 application topically 4 (four) times daily as needed. Pain in shoulder    . diltiazem (CARDIZEM CD) 240 MG 24 hr capsule Take 1 tablet by mouth daily.    . DULoxetine (CYMBALTA) 30 MG capsule Take 30 mg by mouth daily.      Marland Kitchen glimepiride (AMARYL) 1 MG tablet Take 1 mg by mouth daily.      . metFORMIN (GLUCOPHAGE) 500 MG tablet Take 500 mg by mouth See admin instructions. Take two tablets in the morning and one tablet at night    . Multiple Vitamins-Minerals (MULTIVITAMINS THER. W/MINERALS) TABS Take 1 tablet by mouth daily.      .  simvastatin (ZOCOR) 40 MG tablet Take 40 mg by mouth at bedtime.      Marland Kitchen albuterol (PROVENTIL HFA;VENTOLIN HFA) 108 (90 BASE) MCG/ACT inhaler Inhale 2 puffs into the lungs every 6 (six) hours as needed. Shortness of breath    . aspirin EC 81 MG tablet Take 81 mg by mouth daily.      . clorazepate (TRANXENE) 3.75 MG tablet Take 3.75 mg by mouth 2 (two) times daily as needed. For anxiety      No current facility-administered medications on file prior to visit.    ALLERGIES: No Known Allergies  FAMILY HISTORY: No family history on file.  SOCIAL HISTORY: History   Social History  . Marital Status: Married    Spouse Name: N/A  . Number of Children: N/A  . Years of Education: N/A   Occupational History  . Not on file.   Social History Main Topics  . Smoking status: Never Smoker   . Smokeless tobacco: Former Systems developer    Types: Snuff    Quit date: 09/22/1948     Comment: "just tried smoking; didn't smoke to amount to nothing"  . Alcohol Use: No  . Drug Use: No  . Sexual Activity: Not Currently   Other Topics Concern  . Not on file   Social History Narrative    REVIEW OF SYSTEMS: Constitutional: No fevers, chills, or sweats, no generalized fatigue, change in appetite Eyes: No visual changes, double vision, eye pain Ear, nose and throat: No hearing loss, ear pain, nasal congestion, sore throat Cardiovascular: No chest pain, palpitations Respiratory:  No shortness of breath at rest or with exertion, wheezes GastrointestinaI: No nausea, vomiting, diarrhea, abdominal pain, fecal incontinence Genitourinary:  No dysuria, urinary retention or frequency Musculoskeletal:  No neck pain, back pain Integumentary: No rash, pruritus, skin lesions Neurological: as above Psychiatric: No depression, insomnia, anxiety Endocrine: No palpitations, fatigue, diaphoresis, mood swings, change in appetite, change in weight, increased thirst Hematologic/Lymphatic:  No anemia, purpura,  petechiae. Allergic/Immunologic: no itchy/runny eyes, nasal congestion, recent allergic reactions, rashes  PHYSICAL EXAM: Filed Vitals:   11/13/14 1257  BP: 132/78  Pulse: 81  Resp: 16   General: No acute distress Head:  Normocephalic/atraumatic Neck: supple, no paraspinal tenderness, full range of motion Heart:  Regular rate and rhythm Lungs:  Clear to auscultation bilaterally Back: No paraspinal tenderness Skin/Extremities: No rash, no edema Neurological Exam: alert and oriented to person, place, and time. No aphasia or dysarthria. Fund of knowledge is appropriate.  Remote memory intact.  Attention and concentration are normal.    Able to name objects and repeat phrases.  Montreal Cognitive Assessment  11/13/2014 06/01/2014  Visuospatial/ Executive (0/5) 4 4  Naming (0/3) 3 3  Attention: Read list of digits (0/2) 2 2  Attention: Read list of letters (0/1) 1 1  Attention: Serial 7 subtraction starting at 100 (0/3) 3 3  Language: Repeat phrase (0/2) 2 2  Language : Fluency (0/1) 1 0  Abstraction (0/2) 2 2  Delayed Recall (0/5) 2 2  Orientation (0/6) 6 6  Total 26 25  Adjusted Score (based on education) 26 25   Cranial nerves: Pupils equal, round, reactive to light.  Fundoscopic exam unremarkable, no papilledema. Extraocular movements intact with no nystagmus. Visual fields full. Facial sensation intact. No facial asymmetry. Tongue, uvula, palate midline.  Motor: Bulk and tone normal, muscle strength 5/5 throughout with no pronator drift.  Sensation to light touch intact.  No extinction to double simultaneous stimulation.  Deep tendon reflexes 1+ throughout, toes downgoing.  Finger to nose testing intact.  Gait narrow-based and steady, able to tandem walk adequately.  Romberg negative.  IMPRESSION: This is a pleasant 81 yo RH woman with a vascular risk factors including hypertension, hyperlipidemia, diabetes, who presented with worsening memory loss with note of worsening MMSE on her  PCP visit. Her MOCA score today is 26/30 (25/30 in September 2015). Symptoms suggestive of mild cognitive impairment.MRI brain showed age-related changes. We again discussed the option for starting cholinesterase inhibitor such as Aricept, expectations for the medication and side effects. She would like to hold off for now. She again feels that she is under a lot of stress with taking care of her husband, I have stressed to her the need for more help at home and home safety. She understands the importance of control of vascular risk factors, physical exercise, and brain stimulation exercises for brain health. She will follow-up in 1 year.   Thank you for allowing me to participate in her care.  Please do not hesitate to call for any questions or concerns.  The duration of this appointment visit was 15 minutes of face-to-face time with the patient.  Greater than 50% of this time was spent in counseling, explanation of diagnosis, planning of further management, and coordination of care.   Ellouise Newer, M.D.   CC: Dr. Delfina Redwood

## 2014-11-15 ENCOUNTER — Encounter: Payer: Self-pay | Admitting: Neurology

## 2015-04-23 DIAGNOSIS — I519 Heart disease, unspecified: Secondary | ICD-10-CM

## 2015-04-23 HISTORY — DX: Heart disease, unspecified: I51.9

## 2015-05-04 ENCOUNTER — Inpatient Hospital Stay (HOSPITAL_COMMUNITY): Payer: Medicare Other

## 2015-05-04 ENCOUNTER — Emergency Department (HOSPITAL_COMMUNITY): Payer: Medicare Other

## 2015-05-04 ENCOUNTER — Encounter (HOSPITAL_COMMUNITY): Payer: Self-pay | Admitting: *Deleted

## 2015-05-04 ENCOUNTER — Inpatient Hospital Stay (HOSPITAL_COMMUNITY)
Admission: EM | Admit: 2015-05-04 | Discharge: 2015-05-09 | DRG: 064 | Disposition: A | Discharge status: Skilled Nursing Facility | Payer: Medicare Other | Attending: Internal Medicine | Admitting: Internal Medicine

## 2015-05-04 DIAGNOSIS — I639 Cerebral infarction, unspecified: Secondary | ICD-10-CM | POA: Diagnosis present

## 2015-05-04 DIAGNOSIS — R931 Abnormal findings on diagnostic imaging of heart and coronary circulation: Secondary | ICD-10-CM | POA: Diagnosis not present

## 2015-05-04 DIAGNOSIS — I10 Essential (primary) hypertension: Secondary | ICD-10-CM | POA: Diagnosis present

## 2015-05-04 DIAGNOSIS — Z96612 Presence of left artificial shoulder joint: Secondary | ICD-10-CM | POA: Diagnosis present

## 2015-05-04 DIAGNOSIS — G936 Cerebral edema: Secondary | ICD-10-CM | POA: Diagnosis present

## 2015-05-04 DIAGNOSIS — Z66 Do not resuscitate: Secondary | ICD-10-CM | POA: Diagnosis present

## 2015-05-04 DIAGNOSIS — K219 Gastro-esophageal reflux disease without esophagitis: Secondary | ICD-10-CM | POA: Diagnosis present

## 2015-05-04 DIAGNOSIS — Z8673 Personal history of transient ischemic attack (TIA), and cerebral infarction without residual deficits: Secondary | ICD-10-CM

## 2015-05-04 DIAGNOSIS — Z853 Personal history of malignant neoplasm of breast: Secondary | ICD-10-CM | POA: Diagnosis not present

## 2015-05-04 DIAGNOSIS — F419 Anxiety disorder, unspecified: Secondary | ICD-10-CM | POA: Diagnosis present

## 2015-05-04 DIAGNOSIS — Z7982 Long term (current) use of aspirin: Secondary | ICD-10-CM | POA: Diagnosis not present

## 2015-05-04 DIAGNOSIS — Z791 Long term (current) use of non-steroidal anti-inflammatories (NSAID): Secondary | ICD-10-CM

## 2015-05-04 DIAGNOSIS — R4781 Slurred speech: Secondary | ICD-10-CM | POA: Insufficient documentation

## 2015-05-04 DIAGNOSIS — E119 Type 2 diabetes mellitus without complications: Secondary | ICD-10-CM | POA: Diagnosis not present

## 2015-05-04 DIAGNOSIS — R4182 Altered mental status, unspecified: Secondary | ICD-10-CM | POA: Diagnosis not present

## 2015-05-04 DIAGNOSIS — F329 Major depressive disorder, single episode, unspecified: Secondary | ICD-10-CM | POA: Diagnosis present

## 2015-05-04 DIAGNOSIS — R748 Abnormal levels of other serum enzymes: Secondary | ICD-10-CM | POA: Diagnosis present

## 2015-05-04 DIAGNOSIS — K429 Umbilical hernia without obstruction or gangrene: Secondary | ICD-10-CM | POA: Diagnosis present

## 2015-05-04 DIAGNOSIS — R200 Anesthesia of skin: Secondary | ICD-10-CM

## 2015-05-04 DIAGNOSIS — I6789 Other cerebrovascular disease: Secondary | ICD-10-CM | POA: Diagnosis not present

## 2015-05-04 DIAGNOSIS — I255 Ischemic cardiomyopathy: Secondary | ICD-10-CM | POA: Diagnosis present

## 2015-05-04 DIAGNOSIS — R0600 Dyspnea, unspecified: Secondary | ICD-10-CM

## 2015-05-04 DIAGNOSIS — Z823 Family history of stroke: Secondary | ICD-10-CM

## 2015-05-04 DIAGNOSIS — I6340 Cerebral infarction due to embolism of unspecified cerebral artery: Secondary | ICD-10-CM | POA: Diagnosis present

## 2015-05-04 DIAGNOSIS — R7989 Other specified abnormal findings of blood chemistry: Secondary | ICD-10-CM

## 2015-05-04 DIAGNOSIS — R471 Dysarthria and anarthria: Secondary | ICD-10-CM | POA: Diagnosis present

## 2015-05-04 DIAGNOSIS — E1159 Type 2 diabetes mellitus with other circulatory complications: Secondary | ICD-10-CM | POA: Diagnosis not present

## 2015-05-04 DIAGNOSIS — R42 Dizziness and giddiness: Secondary | ICD-10-CM

## 2015-05-04 DIAGNOSIS — R413 Other amnesia: Secondary | ICD-10-CM

## 2015-05-04 DIAGNOSIS — M353 Polymyalgia rheumatica: Secondary | ICD-10-CM | POA: Diagnosis present

## 2015-05-04 DIAGNOSIS — I6350 Cerebral infarction due to unspecified occlusion or stenosis of unspecified cerebral artery: Secondary | ICD-10-CM | POA: Diagnosis not present

## 2015-05-04 DIAGNOSIS — Z9012 Acquired absence of left breast and nipple: Secondary | ICD-10-CM | POA: Diagnosis present

## 2015-05-04 DIAGNOSIS — I959 Hypotension, unspecified: Secondary | ICD-10-CM | POA: Diagnosis not present

## 2015-05-04 DIAGNOSIS — E871 Hypo-osmolality and hyponatremia: Secondary | ICD-10-CM

## 2015-05-04 DIAGNOSIS — G9340 Encephalopathy, unspecified: Secondary | ICD-10-CM | POA: Diagnosis not present

## 2015-05-04 DIAGNOSIS — E785 Hyperlipidemia, unspecified: Secondary | ICD-10-CM | POA: Diagnosis present

## 2015-05-04 DIAGNOSIS — Z79899 Other long term (current) drug therapy: Secondary | ICD-10-CM | POA: Diagnosis not present

## 2015-05-04 DIAGNOSIS — M549 Dorsalgia, unspecified: Secondary | ICD-10-CM | POA: Diagnosis present

## 2015-05-04 DIAGNOSIS — G8929 Other chronic pain: Secondary | ICD-10-CM | POA: Diagnosis present

## 2015-05-04 DIAGNOSIS — I429 Cardiomyopathy, unspecified: Secondary | ICD-10-CM

## 2015-05-04 DIAGNOSIS — Z87891 Personal history of nicotine dependence: Secondary | ICD-10-CM | POA: Diagnosis not present

## 2015-05-04 DIAGNOSIS — R778 Other specified abnormalities of plasma proteins: Secondary | ICD-10-CM

## 2015-05-04 DIAGNOSIS — C50919 Malignant neoplasm of unspecified site of unspecified female breast: Secondary | ICD-10-CM | POA: Diagnosis present

## 2015-05-04 HISTORY — DX: Cerebral infarction, unspecified: I63.9

## 2015-05-04 LAB — I-STAT CHEM 8, ED
BUN: 20 mg/dL (ref 6–20)
Calcium, Ion: 1.19 mmol/L (ref 1.13–1.30)
Chloride: 105 mmol/L (ref 101–111)
Creatinine, Ser: 0.7 mg/dL (ref 0.44–1.00)
Glucose, Bld: 169 mg/dL — ABNORMAL HIGH (ref 65–99)
HCT: 42 % (ref 36.0–46.0)
Hemoglobin: 14.3 g/dL (ref 12.0–15.0)
Potassium: 4.5 mmol/L (ref 3.5–5.1)
Sodium: 140 mmol/L (ref 135–145)
TCO2: 23 mmol/L (ref 0–100)

## 2015-05-04 LAB — COMPREHENSIVE METABOLIC PANEL
ALBUMIN: 3.9 g/dL (ref 3.5–5.0)
ALT: 14 U/L (ref 14–54)
AST: 26 U/L (ref 15–41)
Alkaline Phosphatase: 71 U/L (ref 38–126)
Anion gap: 11 (ref 5–15)
BUN: 14 mg/dL (ref 6–20)
CALCIUM: 9.9 mg/dL (ref 8.9–10.3)
CO2: 23 mmol/L (ref 22–32)
Chloride: 107 mmol/L (ref 101–111)
Creatinine, Ser: 0.83 mg/dL (ref 0.44–1.00)
GFR calc non Af Amer: >60 mL/min (ref >60)
GLUCOSE: 169 mg/dL — AB (ref 65–99)
Potassium: 4.6 mmol/L (ref 3.5–5.1)
SODIUM: 141 mmol/L (ref 135–145)
Total Bilirubin: 0.6 mg/dL (ref 0.3–1.2)
Total Protein: 8.1 g/dL (ref 6.5–8.1)

## 2015-05-04 LAB — PROTIME-INR
INR: 1.05 (ref 0.00–1.49)
Prothrombin Time: 13.9 s (ref 11.6–15.2)

## 2015-05-04 LAB — RAPID URINE DRUG SCREEN, HOSP PERFORMED
AMPHETAMINES: NOT DETECTED
Barbiturates: NOT DETECTED
Benzodiazepines: NOT DETECTED
Cocaine: NOT DETECTED
Opiates: NOT DETECTED
TETRAHYDROCANNABINOL: NOT DETECTED

## 2015-05-04 LAB — I-STAT TROPONIN, ED: Troponin i, poc: 0.49 ng/mL (ref 0.00–0.08)

## 2015-05-04 LAB — URINALYSIS, ROUTINE W REFLEX MICROSCOPIC
BILIRUBIN URINE: NEGATIVE
Glucose, UA: NEGATIVE mg/dL
Hgb urine dipstick: NEGATIVE
KETONES UR: NEGATIVE mg/dL
Leukocytes, UA: NEGATIVE
NITRITE: NEGATIVE
PROTEIN: 30 mg/dL — AB
Specific Gravity, Urine: 1.016 (ref 1.005–1.030)
Urobilinogen, UA: 1 mg/dL (ref 0.0–1.0)
pH: 6 (ref 5.0–8.0)

## 2015-05-04 LAB — CBC
HEMATOCRIT: 40 % (ref 36.0–46.0)
Hemoglobin: 13.4 g/dL (ref 12.0–15.0)
MCH: 28.4 pg (ref 26.0–34.0)
MCHC: 33.5 g/dL (ref 30.0–36.0)
MCV: 84.7 fL (ref 78.0–100.0)
PLATELETS: 270 10*3/uL (ref 150–400)
RBC: 4.72 MIL/uL (ref 3.87–5.11)
RDW: 14.8 % (ref 11.5–15.5)
WBC: 12.5 10*3/uL — AB (ref 4.0–10.5)

## 2015-05-04 LAB — DIFFERENTIAL
Basophils Absolute: 0 10*3/uL (ref 0.0–0.1)
Basophils Relative: 0 % (ref 0–1)
Eosinophils Absolute: 0 10*3/uL (ref 0.0–0.7)
Eosinophils Relative: 0 % (ref 0–5)
LYMPHS ABS: 1.4 10*3/uL (ref 0.7–4.0)
Lymphocytes Relative: 11 % — ABNORMAL LOW (ref 12–46)
MONO ABS: 0.8 10*3/uL (ref 0.1–1.0)
Monocytes Relative: 6 % (ref 3–12)
NEUTROS ABS: 10.3 10*3/uL — AB (ref 1.7–7.7)
Neutrophils Relative %: 83 % — ABNORMAL HIGH (ref 43–77)

## 2015-05-04 LAB — APTT: aPTT: 29 s (ref 24–37)

## 2015-05-04 LAB — TSH: TSH: 1.475 u[IU]/mL (ref 0.350–4.500)

## 2015-05-04 LAB — ETHANOL

## 2015-05-04 LAB — TROPONIN I: TROPONIN I: 1.4 ng/mL — AB (ref <0.031)

## 2015-05-04 LAB — GLUCOSE, CAPILLARY: Glucose-Capillary: 107 mg/dL — ABNORMAL HIGH (ref 65–99)

## 2015-05-04 LAB — URINE MICROSCOPIC-ADD ON

## 2015-05-04 MED ORDER — SODIUM CHLORIDE 0.9 % IV SOLN
INTRAVENOUS | Status: AC
Start: 2015-05-04 — End: 2015-05-05
  Administered 2015-05-04: 21:00:00 via INTRAVENOUS

## 2015-05-04 MED ORDER — INSULIN GLARGINE 100 UNIT/ML ~~LOC~~ SOLN
5.0000 [IU] | Freq: Every day | SUBCUTANEOUS | Status: DC
  Administered 2015-05-04 – 2015-05-08 (×5): 5 [IU] via SUBCUTANEOUS
  Filled 2015-05-04 (×5): qty 0.05

## 2015-05-04 MED ORDER — INSULIN ASPART 100 UNIT/ML ~~LOC~~ SOLN
0.0000 [IU] | Freq: Three times a day (TID) | SUBCUTANEOUS | Status: DC
  Administered 2015-05-06: 2 [IU] via SUBCUTANEOUS
  Administered 2015-05-06 – 2015-05-08 (×4): 1 [IU] via SUBCUTANEOUS
  Administered 2015-05-08 (×2): 2 [IU] via SUBCUTANEOUS
  Administered 2015-05-09: 3 [IU] via SUBCUTANEOUS
  Administered 2015-05-09: 1 [IU] via SUBCUTANEOUS

## 2015-05-04 MED ORDER — ASPIRIN 300 MG RE SUPP: 300.0000 mg | Freq: Every day | RECTAL | Status: DC

## 2015-05-04 MED ORDER — STROKE: EARLY STAGES OF RECOVERY BOOK
Freq: Once | Status: AC
  Administered 2015-05-04: 21:00:00

## 2015-05-04 MED ORDER — ASPIRIN 81 MG PO CHEW
324.0000 mg | CHEWABLE_TABLET | Freq: Once | ORAL | Status: AC
Start: 2015-05-04 — End: 2015-05-04
  Administered 2015-05-04: 324 mg via ORAL
  Filled 2015-05-04: qty 4

## 2015-05-04 MED ORDER — DULOXETINE HCL 30 MG PO CPEP
30.0000 mg | ORAL_CAPSULE | Freq: Every day | ORAL | Status: DC
  Administered 2015-05-04 – 2015-05-09 (×6): 30 mg via ORAL
  Filled 2015-05-04 (×6): qty 1

## 2015-05-04 MED ORDER — SIMVASTATIN 40 MG PO TABS
40.0000 mg | ORAL_TABLET | Freq: Every day | ORAL | Status: DC
  Administered 2015-05-04 – 2015-05-05 (×2): 40 mg via ORAL
  Filled 2015-05-04 (×3): qty 1

## 2015-05-04 MED ORDER — PANTOPRAZOLE SODIUM 40 MG PO TBEC
40.0000 mg | DELAYED_RELEASE_TABLET | Freq: Every day | ORAL | Status: DC
  Administered 2015-05-04 – 2015-05-09 (×6): 40 mg via ORAL
  Filled 2015-05-04 (×6): qty 1

## 2015-05-04 MED ORDER — DICLOFENAC SODIUM 1 % TD GEL: 2.0000 g | Freq: Four times a day (QID) | TRANSDERMAL | Status: DC | PRN

## 2015-05-04 MED ORDER — ALBUTEROL SULFATE (2.5 MG/3ML) 0.083% IN NEBU: 3.0000 mL | INHALATION_SOLUTION | Freq: Four times a day (QID) | RESPIRATORY_TRACT | Status: DC | PRN

## 2015-05-04 MED ORDER — ASPIRIN 325 MG PO TABS
325.0000 mg | ORAL_TABLET | Freq: Every day | ORAL | Status: DC
  Administered 2015-05-04 – 2015-05-09 (×6): 325 mg via ORAL
  Filled 2015-05-04 (×6): qty 1

## 2015-05-04 MED ORDER — ENOXAPARIN SODIUM 40 MG/0.4ML ~~LOC~~ SOLN
40.0000 mg | SUBCUTANEOUS | Status: DC
  Administered 2015-05-04 – 2015-05-08 (×5): 40 mg via SUBCUTANEOUS
  Filled 2015-05-04 (×5): qty 0.4

## 2015-05-04 MED ORDER — INSULIN ASPART 100 UNIT/ML ~~LOC~~ SOLN: 0.0000 [IU] | Freq: Three times a day (TID) | SUBCUTANEOUS | Status: DC

## 2015-05-04 MED ORDER — ACETAMINOPHEN 500 MG PO TABS: 500.0000 mg | ORAL_TABLET | Freq: Four times a day (QID) | ORAL | Status: DC | PRN

## 2015-05-04 NOTE — Progress Notes (Signed)
Pt admitted from the ED accompanied by family, alert but a bit drowsy, denies any pain, pt settled in bed,tele monitor put on pt and call light within pt's reach, will however continue to monitor. Obasogie-Asidi, Cathy Taylor

## 2015-05-04 NOTE — ED Notes (Signed)
Fall risk armband and yellow socks placed on pt.

## 2015-05-04 NOTE — ED Notes (Signed)
Restricted extremity armband placed on pt's right wrist due to mastectomy.

## 2015-05-04 NOTE — Progress Notes (Signed)
CRITICAL VALUE ALERT  Critical value received:  Troponin 1.40  Date of notification:  05/04/15  Time of notification:  2230  Critical value read back:Yes.    Nurse who received alert:  [place]  MD notified (1st page):  Dr Rosanna Randy  Time of first page:  2235  MD notified (2nd page):NA  Time of second page:  Responding MD:  Dr Rosanna Randy  Time MD responded:  2240

## 2015-05-04 NOTE — ED Notes (Signed)
Pt arrives from [facility] via [place]. Pt found around 0800 this morning unable to carry out her normal activities and acting confused with slurred speech. Pt has c/o dizziness, nausea and diaphoresis. Pt has pinpoint pupils and upon arrival to ED has slurred/incomprehensible speech and is aroused by speech.

## 2015-05-04 NOTE — ED Notes (Signed)
Patient transported to MRI 

## 2015-05-04 NOTE — Consult Note (Signed)
Admission H&P    Chief Complaint: Vertigo, confusion and slurred speech.  HPI: Viriginia Taylor is an 81 y.o. female with a istory of hypertension, hyperlipidemia, diabetes mellitus, breast cancer and polymyalgia rheumatica, who arranged vertigo with nausea she was also noted to have slurred speech and confusion and was sent to the emergency room for evaluation from her Primary care physician's office. No focal weakness was noted. She did not experience diplopia. CT scan of her head showed no acute intracranial abnormality. She was noted to have pinpoint pupils on arrival in the emergency room. Urine drug screen is pending. Patient has not been on antiplatelets therapy. She has no history of stroke nor TIA. MRI of the brain showed a large acute right cerebellar ischemic infarction.  LSN: 05/03/2015 tPA Given: No: Beyond time window for treatment consideration mRankin:   Past Medical History  Diagnosis Date  . Stress fracture of left femur with nonunion 07/08/2011  . PMR (polymyalgia rheumatica)   . GERD (gastroesophageal reflux disease)   . Anxiety   . Hypertension   . Abdominal hernia   . Hypercholesteremia   . Heart murmur   . Chronic back pain greater than 3 months duration   . Bronchiectasis 11/12  . 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 Depression   . Bronchitis     "that's what I'm here for"  . Type II diabetes mellitus   . Umbilical hernia 1/88/41    unrepaired  . Breast cancer 1973    right  . Arthritis     "everywhere  . Osteoarthritis of left shoulder 01/06/2012    Past Surgical History  Procedure Laterality Date  . Tubal ligation    . Cataract extraction w/ intraocular lens implant  07/2011    left  . Shoulder arthroscopy  06/2000    left  . Femur surgery  07/07/2011    left rod placed  . Total shoulder arthroplasty  01/06/12    left  . Breast biopsy  1981    left  . Mastectomy  1973    right  . Total shoulder arthroplasty  01/06/2012    Procedure: TOTAL SHOULDER  ARTHROPLASTY;  Surgeon: Johnny Bridge, MD;  Location: [facility];  Service: Orthopedics;  Laterality: Left;    History reviewed. No pertinent family history. Social History:  reports that she has never smoked. She quit smokeless tobacco use about 66 years ago. Her smokeless tobacco use included Snuff. She reports that she does not drink alcohol or use illicit drugs.  Allergies: No Known Allergies   Medications: Patient's preadmission medications were reviewed by me.  ROS: History obtained from the patient  General ROS: negative for - chills, fatigue, fever, night sweats, weight gain or weight loss Psychological ROS: negative for - behavioral disorder, hallucinations, memory difficulties, mood swings or suicidal ideation Ophthalmic ROS: negative for - blurry vision, double vision, eye pain or loss of vision ENT ROS: negative for - epistaxis, nasal discharge, oral lesions, sore throat, tinnitus or vertigo Allergy and Immunology ROS: negative for - hives or itchy/watery eyes Hematological and Lymphatic ROS: negative for - bleeding problems, bruising or swollen lymph nodes Endocrine ROS: negative for - galactorrhea, hair pattern changes, polydipsia/polyuria or temperature intolerance Respiratory ROS: negative for - cough, hemoptysis, shortness of breath or wheezing Cardiovascular ROS: negative for - chest pain, dyspnea on exertion, edema or irregular heartbeat Gastrointestinal ROS: negative for - abdominal pain, diarrhea, hematemesis, nausea/vomiting or stool incontinence Genito-Urinary ROS: negative for -  dysuria, hematuria, incontinence or urinary frequency/urgency Musculoskeletal ROS: negative for - joint swelling or muscular weakness Neurological ROS: as noted in HPI Dermatological ROS: negative for rash and skin lesion changes  Physical Examination: Blood pressure 107/77, pulse 67, temperature 97.6 F (36.4 C), temperature source Oral, resp. rate 13, SpO2 100 %.  HEENT-  Normocephalic,  no lesions, without obvious abnormality.  Normal external eye and conjunctiva.  Normal TM's bilaterally.  Normal auditory canals and external ears. Normal external nose, mucus membranes and septum.  Normal pharynx. Neck supple with no masses, nodes, nodules or enlargement. Cardiovascular - regular rate and rhythm, S1, S2 normal, no murmur, click, rub or gallop Lungs - chest clear, no wheezing, rales, normal symmetric air entry Abdomen - soft, non-tender; bowel sounds normal; no masses,  no organomegaly Extremities - no joint deformities, effusion, or inflammation and no edema  Neurologic Examination: Mental Status: Alert, oriented, thought content appropriate.  Speech fluent without evidence of aphasia. Able to follow commands without difficulty. Cranial Nerves: II-Visual fields were normal. III/IV/VI-Pupils were equal and normal in size, as well as equally reactive to light. Extraocular movements were full and conjugate.    V/VII-no facial numbness and no facial weakness. VIII-normal. X-minimal slurring of speech; symmetrical palatal movement. XI: trapezius strength/neck flexion strength normal bilaterally XII-midline tongue extension with normal strength. Motor: 5/5 bilaterally with normal tone and bulk Sensory: Normal throughout. Deep Tendon Reflexes: 2+ and symmetric. Plantars: Flexor bilaterally Cerebellar: Normal finger-to-nose testing bilaterally. Carotid auscultation: Normal  Results for orders placed or performed during the hospital encounter of 05/04/15 (from the past 48 hour(s))  Ethanol     Status: None   Collection Time: 05/04/15  1:39 PM  Result Value Ref Range   Alcohol, Ethyl (B) <5 <5 mg/dL    Comment:        LOWEST DETECTABLE LIMIT FOR SERUM ALCOHOL IS 5 mg/dL FOR MEDICAL PURPOSES ONLY   I-stat troponin, ED (not at [facility], [facility])     Status: Abnormal   Collection Time: 05/04/15  1:43 PM  Result Value Ref Range   Troponin i, poc 0.49 (HH) 0.00 - 0.08 ng/mL    Comment NOTIFIED PHYSICIAN    Comment 3            Comment: Due to the release kinetics of cTnI, a negative result within the first hours of the onset of symptoms does not rule out myocardial infarction with certainty. If myocardial infarction is still suspected, repeat the test at appropriate intervals.   I-Stat Chem 8, ED  (not at [facility], [facility])     Status: Abnormal   Collection Time: 05/04/15  1:44 PM  Result Value Ref Range   Sodium 140 135 - 145 mmol/L   Potassium 4.5 3.5 - 5.1 mmol/L   Chloride 105 101 - 111 mmol/L   BUN 20 6 - 20 mg/dL   Creatinine, Ser 0.70 0.44 - 1.00 mg/dL   Glucose, Bld 169 (H) 65 - 99 mg/dL   Calcium, Ion 1.19 1.13 - 1.30 mmol/L   TCO2 23 0 - 100 mmol/L   Hemoglobin 14.3 12.0 - 15.0 g/dL   HCT 42.0 36.0 - 46.0 %  Protime-INR     Status: None   Collection Time: 05/04/15  2:07 PM  Result Value Ref Range   Prothrombin Time 13.9 11.6 - 15.2 seconds   INR 1.05 0.00 - 1.49  APTT     Status: None   Collection Time: 05/04/15  2:07 PM  Result Value Ref Range  aPTT 29 24 - 37 seconds  CBC     Status: Abnormal   Collection Time: 05/04/15  2:07 PM  Result Value Ref Range   WBC 12.5 (H) 4.0 - 10.5 K/uL   RBC 4.72 3.87 - 5.11 MIL/uL   Hemoglobin 13.4 12.0 - 15.0 g/dL   HCT 40.0 36.0 - 46.0 %   MCV 84.7 78.0 - 100.0 fL   MCH 28.4 26.0 - 34.0 pg   MCHC 33.5 30.0 - 36.0 g/dL   RDW 14.8 11.5 - 15.5 %   Platelets 270 150 - 400 K/uL  Differential     Status: Abnormal   Collection Time: 05/04/15  2:07 PM  Result Value Ref Range   Neutrophils Relative % 83 (H) 43 - 77 %   Neutro Abs 10.3 (H) 1.7 - 7.7 K/uL   Lymphocytes Relative 11 (L) 12 - 46 %   Lymphs Abs 1.4 0.7 - 4.0 K/uL   Monocytes Relative 6 3 - 12 %   Monocytes Absolute 0.8 0.1 - 1.0 K/uL   Eosinophils Relative 0 0 - 5 %   Eosinophils Absolute 0.0 0.0 - 0.7 K/uL   Basophils Relative 0 0 - 1 %   Basophils Absolute 0.0 0.0 - 0.1 K/uL  Comprehensive metabolic panel     Status: Abnormal   Collection  Time: 05/04/15  2:07 PM  Result Value Ref Range   Sodium 141 135 - 145 mmol/L   Potassium 4.6 3.5 - 5.1 mmol/L   Chloride 107 101 - 111 mmol/L   CO2 23 22 - 32 mmol/L   Glucose, Bld 169 (H) 65 - 99 mg/dL   BUN 14 6 - 20 mg/dL   Creatinine, Ser 0.83 0.44 - 1.00 mg/dL   Calcium 9.9 8.9 - 10.3 mg/dL   Total Protein 8.1 6.5 - 8.1 g/dL   Albumin 3.9 3.5 - 5.0 g/dL   AST 26 15 - 41 U/L   ALT 14 14 - 54 U/L   Alkaline Phosphatase 71 38 - 126 U/L   Total Bilirubin 0.6 0.3 - 1.2 mg/dL   GFR calc non Af Amer >60 >60 mL/min   GFR calc Af Amer >60 >60 mL/min    Comment: (NOTE) The eGFR has been calculated using the CKD EPI equation. This calculation has not been validated in all clinical situations. eGFR's persistently <60 mL/min signify possible Chronic Kidney Disease.    Anion gap 11 5 - 15   Ct Head Wo Contrast  05/04/2015   CLINICAL DATA:  62YTK arrives from [facility] via [place]. Pt found around 0800 this morning unable to carry out her normal activities and acting confused with slurred speech. Pt has c/o dizziness, nausea and diaphoresis. Pt has pinpoint pupils and upon arrival to ED has slurred/incomprehensible speech and is aroused by speech.  EXAM: CT HEAD WITHOUT CONTRAST  TECHNIQUE: Contiguous axial images were obtained from the base of the skull through the vertex without intravenous contrast.  COMPARISON:  Brain MRI, 06/06/2014.  Head CT, 04/24/2011.  FINDINGS: Ventricles are normal in configuration. There is ventricular and sulcal enlargement reflecting mild atrophy. There is a focus of hypoattenuation along the anterior left basal ganglia and anterior limb of the left internal capsule consistent with old lacune infarct, stable from the prior brain MRI. Other smaller foci of hypoattenuation in the base a ganglia suggests additional small lacune infarcts. There are no parenchymal masses or mass effect. There is no evidence of a recent cortical infarct. Patchy white matter  hypoattenuation is noted consistent with moderate chronic microvascular ischemic change.  There are no extra-axial masses or abnormal fluid collections.  There is no intracranial hemorrhage.  Visualized sinuses and mastoid air cells are clear.  IMPRESSION: 1. No acute intracranial abnormality 2. Mild atrophy and moderate chronic microvascular ischemic change. 3. Small basal gangliar lacune infarcts.   Electronically Signed   By: Lajean Manes M.D.   On: 05/04/2015 13:54    Assessment: 81 y.o. female with hypertension and hyperlipidemia presenting with a large right cerebellar acute ischemic infarction.  Stroke Risk Factors - hyperlipidemia and hypertension  Plan: 1. HgbA1c, fasting lipid panel 2. MRI, MRA  of the brain without contrast 3. PT consult, OT consult, Speech consult 4. Echocardiogram 5. Carotid dopplers 6. Prophylactic therapy-Antiplatelet med: Aspirin  7. Risk factor modification 8. Telemetry monitoring  C.R. Nicole Kindred, MD [facility] [phone]  05/04/2015, 3:18 PM

## 2015-05-04 NOTE — Consult Note (Signed)
CARDIOLOGY CONSULT NOTE   Patient ID: Cathy Taylor MRN: [id number], DOB/AGE: 03/15/1933   Admit date: 05/04/2015 Date of Consult: 05/04/2015   Primary Physician: Kandice Hams, MD Primary Cardiologist: Dr. Tamala Julian (Not seen for 5+ years) Reason for Consult: Elevated Troponin  Pt. Profile: 81 y.o. female w/ PMH of HTN, HLD, GERD, Type 2 DM, PMR, Breast Cancer and Anxiety who presented to the [facility] ED on 05/04/15 via EMS for altered mental status.  Problem List: Past Medical History  Diagnosis Date  . Stress fracture of left femur with nonunion 07/08/2011  . PMR (polymyalgia rheumatica)   . GERD (gastroesophageal reflux disease)   . Anxiety   . Hypertension   . Abdominal hernia   . Hypercholesteremia   . Heart murmur   . Chronic back pain greater than 3 months duration   . Bronchiectasis 11/12  . 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 Depression   . Bronchitis     "that's what I'm here for"  . Type II diabetes mellitus   . Umbilical hernia 05/25/39    unrepaired  . Breast cancer 1973    right  . Arthritis     "everywhere  . Osteoarthritis of left shoulder 01/06/2012    Past Surgical History  Procedure Laterality Date  . Tubal ligation    . Cataract extraction w/ intraocular lens implant  07/2011    left  . Shoulder arthroscopy  06/2000    left  . Femur surgery  07/07/2011    left rod placed  . Total shoulder arthroplasty  01/06/12    left  . Breast biopsy  1981    left  . Mastectomy  1973    right  . Total shoulder arthroplasty  01/06/2012    Procedure: TOTAL SHOULDER ARTHROPLASTY;  Surgeon: Johnny Bridge, MD;  Location: [facility];  Service: Orthopedics;  Laterality: Left;     HPI: Cathy Taylor is a 81 y.o. female with PMH of HTN, HLD, GERD, Type 2 DM, PMR, Breast Cancer and Anxiety who presented to the [facility] ED on 05/04/15 via EMS for altered mental status. Around 6:00 AM this morning she was complaining of fatigue to her son via the phone. At 8:00 AM was noted  to be acting confused, having diaphoresis, and having slurred speech by her home caregivers. She also reported having nausea and vertigo but did not mention having chest pain, radiating pain, or shortness of breath. She went to her PCP, then was transferred to the ED via EMS.  Upon being assessed in the ED, she is able to respond to questions and give an accurate history that was confirmed with her two daughters who were present. She denies any chest pain, palpitations, radiating pain, or shortness of breath at this time. Her daughters mention she has not complained about chest pain or shortness of breath recently, but they note she has been "breathing more heavily" than usual when walking around the house. They are unsure if this is secondary to a medical condition or perhaps the added stress of taking care of her husband that has Parkinson's and dementia. The patient's children live nearby to help out and they also have paid caregivers come to the house several times per week.  The patient has a history of HTN and HLD which are managed by her PCP. She use to see Dr. Tamala Julian as her Primary Cardiologist but is unsure why she went there. She reports never having a heart  attack, CHF, or irregular rhythms and her daughters confirm this information as well.   Allergies: No Known Allergies  Inpatient Medications: None thus far.  Family History Family History  Problem Relation Age of Onset  . Heart attack Father   . Stroke Son   . Stroke Maternal Aunt      Social History Social History   Social History  . Marital Status: Married    Spouse Name: N/A  . Number of Children: N/A  . Years of Education: N/A   Occupational History  . Not on file.   Social History Main Topics  . Smoking status: Never Smoker   . Smokeless tobacco: Former Systems developer    Types: Snuff    Quit date: 09/22/1948     Comment: "just tried smoking; didn't smoke to amount to nothing"  . Alcohol Use: No  . Drug Use: No  . Sexual  Activity: Not Currently   Other Topics Concern  . Not on file   Social History Narrative   Currently lives with her husband who has Parkinson's Disease and Dementia. Their children live nearby. They have home health caregivers who assist them throughout the week as well.     Review of Systems: General:  No chills, fever, night sweats or weight changes.  Cardiovascular:  No chest pain, edema, orthopnea, palpitations, or paroxysmal nocturnal dyspnea. Respiratory: No cough, dyspnea Urologic: No hematuria, dysuria Abdominal:   No vomiting, diarrhea, bright red blood per rectum, melena, or hematemesis. Positive for nausea. Neurologic:  No visual changes.  All other systems reviewed and are otherwise negative except as noted above.  Physical Exam: Blood pressure 96/76, pulse 69, temperature 97.6 F (36.4 C), temperature source Oral, resp. rate 16, SpO2 100 %.  General: Pleasant, NAD. Psych: Normal affect. Neuro: Alert and oriented X 3. Moves all extremities spontaneously. HEENT: Normal  Neck: Supple without bruits. JVD not elevated. Lungs:  Resp regular and unlabored, CTA without wheezing or rales. Heart: RRR no s3, s4, or murmurs. Abdomen: Soft, non-tender, non-distended, BS + x 4.  Extremities: No clubbing, cyanosis or edema. DP/PT/Radials 2+ and equal bilaterally.  Labs: No results for input(s): CKTOTAL, CKMB, TROPONINI in the last 72 hours. Lab Results  Component Value Date   WBC 12.5* 05/04/2015   HGB 13.4 05/04/2015   HCT 40.0 05/04/2015   MCV 84.7 05/04/2015   PLT 270 05/04/2015     Recent Labs Lab 05/04/15 1407  NA 141  K 4.6  CL 107  CO2 23  BUN 14  CREATININE 0.83  CALCIUM 9.9  PROT 8.1  BILITOT 0.6  ALKPHOS 71  ALT 14  AST 26  GLUCOSE 169*   Lab Results  Component Value Date   DDIMER 0.68* 09/10/2011   Troponin (Point of Care Test)  Recent Labs  05/04/15 1343  TROPIPOC 0.49*   Radiology/Studies: Ct Head Wo Contrast: 05/04/2015   CLINICAL  DATA:  81YOF arrives [facility] via [place]. Pt found around 0800 this morning unable to carry out her normal activities and acting confused with slurred speech. Pt has c/o dizziness, nausea and diaphoresis. Pt has pinpoint pupils and upon arrival to ED has slurred/incomprehensible speech and is aroused by speech.  EXAM: CT HEAD WITHOUT CONTRAST  TECHNIQUE: Contiguous axial images were obtained from the base of the skull through the vertex without intravenous contrast.  COMPARISON:  Brain MRI, 06/06/2014.  Head CT, 04/24/2011.  FINDINGS: Ventricles are normal in configuration. There is ventricular and sulcal enlargement reflecting mild atrophy.  There is a focus of hypoattenuation along the anterior left basal ganglia and anterior limb of the left internal capsule consistent with old lacune infarct, stable from the prior brain MRI. Other smaller foci of hypoattenuation in the base a ganglia suggests additional small lacune infarcts. There are no parenchymal masses or mass effect. There is no evidence of a recent cortical infarct. Patchy white matter hypoattenuation is noted consistent with moderate chronic microvascular ischemic change.  There are no extra-axial masses or abnormal fluid collections.  There is no intracranial hemorrhage.  Visualized sinuses and mastoid air cells are clear.  IMPRESSION: 1. No acute intracranial abnormality 2. Mild atrophy and moderate chronic microvascular ischemic change. 3. Small basal gangliar lacune infarcts.   Electronically Signed   By: Lajean Manes M.D.   On: 05/04/2015 13:54    ASSESSMENT AND PLAN:  1. Elevated Troponin - I-stat troponin 0.49. Will need to cycle troponin. Patient currently without any chest pain, radiating pain, or shortness of breath. - Obtain serial EKG's. - Continue ASA 81mg .   2. HTN - BP has been 96/43 - 117/95 since arrival in ED. - Was on Lotensin HCT 20-12.5mg  and Cardizem CD 240mg  daily at home. Consider restarting once BP can  tolerate and is OK per Neuro pending results from her MRI.   3. HLD - consider fasting lipid panel - continue home Simvastatin 20mg  for now.  Otherwise, per IM.   Signed, Dineen Kid, PA-C 05/04/2015, 5:05 PM     Patient seen and examined. Agree with assessment and plan. Very pleasant 81 yo WF who has a h/o hypertension, DM, PMR who presented this am with altered mental status.  She denies any awareness of AF, but has been on Cardizem daily. No chest pain but has experienced shortness of breath with walking.  ECG is without acute STT changes, with NSR, LAHB, and IRBBB. Troponin poc is miniminally elevated; recommend serial f/u. Telemetry to monitor rhtythm and assess for PAF. MRI just completed; Check echo and carotid studies. Will follow.   Troy Sine, MD, [facility] 05/04/2015 6:24 PM

## 2015-05-04 NOTE — H&P (Signed)
[facility] History and Physical                                                                                    Cathy Taylor, is a 81 y.o. female  MRN: [id number]   DOB - [birth year]  Admit Date - 05/04/2015  Outpatient Primary MD for the patient is Cathy Hams, MD  Referring Physician:  Dr. Canary Taylor  Chief Complaint:   Chief Complaint  Patient presents with  . Altered Mental Status     HPI  Cathy Taylor  is a 81 y.o. female, with diabetes mellitus type 2, polymyalgia rheumatica, and a history of breast cancer. She normally is at home caring for her husband who has Parkinson's. According to her daughters she felt poorly yesterday and was tired. This morning her daughter attempted to call her several times but received no answer. The aide who usually helps care for the patient's husband arrived at 21 AM. She found Cathy Taylor in bed, diaphoretic, dizzy, with pinpoint pupils, unable to stand, with slurred speech.  Cathy Taylor was taken to her primary care physician's office at 11:30. Dr. polite promptly sent her to the emergency department for stroke workup.  In the emergency department the patient is altered, lethargic, with slurred speech. She is experiencing symptoms of vertigo.  Her white count is elevated at 12.5, and her point-of-care troponin is slightly elevated at 0.9. Her UA is clear. EKG does not show acute changes when compared to previous tracing of October 2012. She is normally hypertensive and her blood pressure is relatively low at 96/76. Chest x-ray is pending.  Review of Systems   In addition to the HPI above,  No Fever-chills, No Chest pain, Cough or Shortness of Breath, No Abdominal pain, No Nausea or Vomiting, Bowel movements are regular, No Blood in stool or Urine, No dysuria, No new skin rashes or bruises, No new joints pains-aches,  No new weakness, tingling, numbness in any extremity, No recent weight gain or loss, A full 10 point Review of Systems was  done, except as stated above, all other Review of Systems were negative.  Past Medical History  Past Medical History  Diagnosis Date  . Stress fracture of left femur with nonunion 07/08/2011  . PMR (polymyalgia rheumatica)   . GERD (gastroesophageal reflux disease)   . Anxiety   . Hypertension   . Abdominal hernia   . Hypercholesteremia   . Heart murmur   . Chronic back pain greater than 3 months duration   . Bronchiectasis 11/12  . 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 Depression   . Bronchitis     "that's what I'm here for"  . Type II diabetes mellitus   . Umbilical hernia 1/75/10    unrepaired  . Breast cancer 1973    right  . Arthritis     "everywhere  . Osteoarthritis of left shoulder 01/06/2012    Past Surgical History  Procedure Laterality Date  . Tubal ligation    . Cataract extraction w/ intraocular lens implant  07/2011    left  . Shoulder arthroscopy  06/2000    left  .  Femur surgery  07/07/2011    left rod placed  . Total shoulder arthroplasty  01/06/12    left  . Breast biopsy  1981    left  . Mastectomy  1973    right  . Total shoulder arthroplasty  01/06/2012    Procedure: TOTAL SHOULDER ARTHROPLASTY;  Surgeon: Cathy Bridge, MD;  Location: [place];  Service: Orthopedics;  Laterality: Left;      Social History Social History  Substance Use Topics  . Smoking status: Never Smoker   . Smokeless tobacco: Former Systems developer    Types: Snuff    Quit date: 09/22/1948     Comment: "just tried smoking; didn't smoke to amount to nothing"  . Alcohol Use: No   independent with ADLs. Cares for her elderly husband with Parkinson's  Family History Family History  Problem Relation Age of Onset  . Heart attack Father   . Stroke Son   . Stroke Maternal Aunt     Prior to Admission medications   Medication Sig Start Date End Date Taking? Authorizing Provider  acetaminophen (TYLENOL) 500 MG tablet Take 500 mg by mouth every 6 (six) hours as needed. For pain    Yes  Historical Provider, MD  aspirin EC 81 MG tablet Take 81 mg by mouth daily.     Yes Historical Provider, MD  benazepril-hydrochlorthiazide (LOTENSIN HCT) 20-12.5 MG per tablet Take 1 tablet by mouth daily.     Yes Historical Provider, MD  diclofenac sodium (VOLTAREN) 1 % GEL Apply 1 application topically 4 (four) times daily as needed. Pain in shoulder   Yes Historical Provider, MD  diltiazem (CARDIZEM CD) 240 MG 24 hr capsule Take 1 tablet by mouth daily. 09/18/11  Yes Historical Provider, MD  DULoxetine (CYMBALTA) 30 MG capsule Take 30 mg by mouth daily.     Yes Historical Provider, MD  esomeprazole (NEXIUM) 20 MG capsule Take 20 mg by mouth 2 (two) times daily before a meal.   Yes Historical Provider, MD  glimepiride (AMARYL) 1 MG tablet Take 1 mg by mouth daily.     Yes Historical Provider, MD  metFORMIN (GLUCOPHAGE) 500 MG tablet Take 1,000 mg by mouth 2 (two) times daily.    Yes Historical Provider, MD  simvastatin (ZOCOR) 40 MG tablet Take 40 mg by mouth at bedtime.     Yes Historical Provider, MD  albuterol (PROVENTIL HFA;VENTOLIN HFA) 108 (90 BASE) MCG/ACT inhaler Inhale 2 puffs into the lungs every 6 (six) hours as needed. Shortness of breath    Historical Provider, MD    No Known Allergies  Physical Exam  Vitals  Blood pressure 96/76, pulse 69, temperature 97.6 F (36.4 C), temperature source Oral, resp. rate 16, SpO2 100 %.   General: Overweight, elderly female lying in bed in NAD, 2 daughters at bedside  Psych:  Lethargic, attempts to follow commands,  cooperative but very sleepy  Neuro:   Speech is garbled, patient has horizontal nystagmus on exam, she has difficulty with extraocular movements. She is lethargic, falling asleep between questions. Strength is 5 over 5 in all 4 extremities, sensation is equal  ENT:  Pupils are equal and round and react to my flashlight, sclerae are clear.  Neck:  Supple, No lymphadenopathy appreciated  Respiratory:  Symmetrical chest wall  movement, Good air movement bilaterally, CTAB.  Cardiac:  RRR, No Murmurs, no LE edema noted, no JVD.    Abdomen:  Positive bowel sounds, Soft, Non tender, Non distended,  No masses appreciated  Skin:  No Cyanosis, Normal Skin Turgor, No Skin Rash or Bruise.  Extremities:  Able to move all 4. 5/5 strength in each,  no effusions.  Data Review  CBC  Recent Labs Lab 05/04/15 1344 05/04/15 1407  WBC  --  12.5*  HGB 14.3 13.4  HCT 42.0 40.0  PLT  --  270  MCV  --  84.7  MCH  --  28.4  MCHC  --  33.5  RDW  --  14.8  LYMPHSABS  --  1.4  MONOABS  --  0.8  EOSABS  --  0.0  BASOSABS  --  0.0    Chemistries   Recent Labs Lab 05/04/15 1344 05/04/15 1407  NA 140 141  K 4.5 4.6  CL 105 107  CO2  --  23  GLUCOSE 169* 169*  BUN 20 14  CREATININE 0.70 0.83  CALCIUM  --  9.9  AST  --  26  ALT  --  14  ALKPHOS  --  71  BILITOT  --  0.6     Coagulation profile  Recent Labs Lab 05/04/15 1407  INR 1.05     Urinalysis    Component Value Date/Time   COLORURINE YELLOW 05/04/2015 [place] 05/04/2015 1535   LABSPEC 1.016 05/04/2015 1535   PHURINE 6.0 05/04/2015 1535   GLUCOSEU NEGATIVE 05/04/2015 1535   HGBUR NEGATIVE 05/04/2015 1535   BILIRUBINUR NEGATIVE 05/04/2015 1535   KETONESUR NEGATIVE 05/04/2015 1535   PROTEINUR 30* 05/04/2015 1535   UROBILINOGEN 1.0 05/04/2015 1535   NITRITE NEGATIVE 05/04/2015 1535   LEUKOCYTESUR NEGATIVE 05/04/2015 1535    Imaging results:   Ct Head Wo Contrast  05/04/2015   CLINICAL DATA:  81YOF [facility]. Pt found around 0800 this morning unable to carry out her normal activities and acting confused with slurred speech. Pt has c/o dizziness, nausea and diaphoresis. Pt has pinpoint pupils and upon arrival to ED has slurred/incomprehensible speech and is aroused by speech.  EXAM: CT HEAD WITHOUT CONTRAST  TECHNIQUE: Contiguous axial images were obtained from the base of the skull through  the vertex without intravenous contrast.  COMPARISON:  Brain MRI, 06/06/2014.  Head CT, 04/24/2011.  FINDINGS: Ventricles are normal in configuration. There is ventricular and sulcal enlargement reflecting mild atrophy. There is a focus of hypoattenuation along the anterior left basal ganglia and anterior limb of the left internal capsule consistent with old lacune infarct, stable from the prior brain MRI. Other smaller foci of hypoattenuation in the base a ganglia suggests additional small lacune infarcts. There are no parenchymal masses or mass effect. There is no evidence of a recent cortical infarct. Patchy white matter hypoattenuation is noted consistent with moderate chronic microvascular ischemic change.  There are no extra-axial masses or abnormal fluid collections.  There is no intracranial hemorrhage.  Visualized sinuses and mastoid air cells are clear.  IMPRESSION: 1. No acute intracranial abnormality 2. Mild atrophy and moderate chronic microvascular ischemic change. 3. Small basal gangliar lacune infarcts.   Electronically Signed   By: Lajean Manes M.D.   On: 05/04/2015 13:54    My personal review of EKG: Sinus rhythm, LAFB, no QT prolongation   Assessment & Plan  Principal Problem:   Slurred speech Active Problems:   Altered mental status   Vertigo   Diabetes mellitus   PMR (polymyalgia rheumatica)  Slurred speech with altered mental status (lethargy) Am concerned about this acute change in mental status  and will admit the patient to stepdown. Admit using the stroke order set. MRI/MRA, chest x-ray, echo, carotids, serologies, PT/OT/speech pending Patient is currently nothing by mouth awaiting speech eval. I anticipate she will fail Neurology has been consulted. Will place patient on a 320 mg aspirin suppository.  She was on an 81 mg aspirin prior to admission.  Vertigo Could be related to cerebellar stroke. Patient unable to state whether she's had the symptoms  previously MRI/MRA pending.  PT evaluation for vertiginous symptoms requested.  Elevated POC Troponin Patient is not complaining of chest pain. We'll cycle troponins, check TSH. EDP consulted cardiology.  We appreciate their recommendations  Diabetes mellitus Hold oral diabetic medications. We will give low-dose Lantus and sliding scale insulin.  Hypertension Relatively hypotensive at this point.  Holding diltiazem, benazepril-HCTZ  Hyperlipidemia Holding simvastatin until patient is able to take oral medications.    Consultants Called:  Cardiology, and neurology  Family Communication:   2 daughters at bedside  Code Status:  DO NOT RESUSCITATE  Condition:  Guarded, lethargic, admitted to stepdown  Potential Disposition: To be determined based on workup  Time spent in minutes : [address]  PA-C on 05/04/2015 at 5:25 PM Between 7am to 7pm - Pager - [phone] After 7pm go to [email or web address] - password [email or web address] And look for the night coverage person covering me after hours  [facility]

## 2015-05-04 NOTE — ED Notes (Signed)
Pt bed assignment changed to 4N per neuro.

## 2015-05-04 NOTE — ED Notes (Signed)
Phlebotomy at bedside.

## 2015-05-04 NOTE — ED Notes (Signed)
Attempted report to 4N   

## 2015-05-04 NOTE — ED Notes (Addendum)
MD Linker at bedside with the patient and family.    Phlebotomy at bedside.

## 2015-05-04 NOTE — ED Provider Notes (Signed)
CSN: [id number]     Arrival date & time 05/04/15  1249 History   First MD Initiated Contact with Patient 05/04/15 1250     Chief Complaint  Patient presents with  . Altered Mental Status     (Consider location/radiation/quality/duration/timing/severity/associated sxs/prior Treatment) HPI  A LEVEL 5 CAVEAT PERTAINS DUE TO ALTERED MENTAL STATUS Pt presenting with c/o being found by family and caregiver at 8am with confusion and slurred speech.  This morning she was also diaphoretic, nauseated and felt dizzy.  Pt is able to answer questions, but speech is slowed and slurred.  Pt with pinpoint pupils.     Past Medical History  Diagnosis Date  . Stress fracture of left femur with nonunion 07/08/2011  . PMR (polymyalgia rheumatica)   . GERD (gastroesophageal reflux disease)   . Anxiety   . Hypertension   . Abdominal hernia   . Hypercholesteremia   . Heart murmur   . Chronic back pain greater than 3 months duration   . Bronchiectasis 11/12  . 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 Depression   . Bronchitis     "that's what I'm here for"  . Type II diabetes mellitus   . Umbilical hernia 7/67/20    unrepaired  . Breast cancer 1973    right  . Arthritis     "everywhere  . Osteoarthritis of left shoulder 01/06/2012  . Stroke    Past Surgical History  Procedure Laterality Date  . Tubal ligation    . Cataract extraction w/ intraocular lens implant  07/2011    left  . Shoulder arthroscopy  06/2000    left  . Femur surgery  07/07/2011    left rod placed  . Total shoulder arthroplasty  01/06/12    left  . Breast biopsy  1981    left  . Mastectomy  1973    right  . Total shoulder arthroplasty  01/06/2012    Procedure: TOTAL SHOULDER ARTHROPLASTY;  Surgeon: Johnny Bridge, MD;  Location: [place];  Service: Orthopedics;  Laterality: Left;   Family History  Problem Relation Age of Onset  . Heart attack Father   . Stroke Son   . Stroke Maternal Aunt    Social History  Substance  Use Topics  . Smoking status: Never Smoker   . Smokeless tobacco: Former Systems developer    Types: Snuff    Quit date: 09/22/1948     Comment: "just tried smoking; didn't smoke to amount to nothing"  . Alcohol Use: No   OB History    No data available     Review of Systems  UNABLE TO OBTAIN ROS DUE TO LEVEL 5 CAVEAT    Allergies  Review of patient's allergies indicates no known allergies.  Home Medications   Prior to Admission medications   Medication Sig Start Date End Date Taking? Authorizing Provider  acetaminophen (TYLENOL) 500 MG tablet Take 500 mg by mouth every 6 (six) hours as needed. For pain    Yes Historical Provider, MD  aspirin EC 81 MG tablet Take 81 mg by mouth daily.     Yes Historical Provider, MD  benazepril-hydrochlorthiazide (LOTENSIN HCT) 20-12.5 MG per tablet Take 1 tablet by mouth daily.     Yes Historical Provider, MD  diclofenac sodium (VOLTAREN) 1 % GEL Apply 1 application topically 4 (four) times daily as needed. Pain in shoulder   Yes Historical Provider, MD  diltiazem (CARDIZEM CD) 240 MG 24 hr capsule Take  1 tablet by mouth daily. 09/18/11  Yes Historical Provider, MD  DULoxetine (CYMBALTA) 30 MG capsule Take 30 mg by mouth daily.     Yes Historical Provider, MD  esomeprazole (NEXIUM) 20 MG capsule Take 20 mg by mouth 2 (two) times daily before a meal.   Yes Historical Provider, MD  glimepiride (AMARYL) 1 MG tablet Take 1 mg by mouth daily.     Yes Historical Provider, MD  metFORMIN (GLUCOPHAGE) 500 MG tablet Take 1,000 mg by mouth 2 (two) times daily.    Yes Historical Provider, MD  simvastatin (ZOCOR) 40 MG tablet Take 40 mg by mouth at bedtime.     Yes Historical Provider, MD  albuterol (PROVENTIL HFA;VENTOLIN HFA) 108 (90 BASE) MCG/ACT inhaler Inhale 2 puffs into the lungs every 6 (six) hours as needed. Shortness of breath    Historical Provider, MD   BP 127/64 mmHg  Pulse 63  Temp(Src) 98.2 F (36.8 C) (Oral)  Resp 16  Ht 5\' 3"  (1.6 m)  Wt 170 lb 3.1  oz (77.2 kg)  BMI 30.16 kg/m2  SpO2 96%  Vitals reviewed Physical Exam  Physical Examination: General appearance - alert, ill appearing, and in no distress Mental status - alert, but not able to answer orientation questions Eyes - pinpoint pupils bilaterally, no scleral icterus, no conjunctival injection Mouth - mucous membranes moist, pharynx normal without lesions Chest - clear to auscultation, no wheezes, rales or rhonchi, symmetric air entry Heart - normal rate, regular rhythm, normal S1, S2, no murmurs, rubs, clicks or gallops Abdomen - soft, nontender, nondistended, no masses or organomegaly Neurological - alert, slurred speech, no facial droop, able to answer some questions, but slow to answer, moving all extremities, no tremor, sensation intact in extremities x 4, Extremities - peripheral pulses normal, no pedal edema, no clubbing or cyanosis Skin - normal coloration and turgor, no rashes  ED Course  Procedures (including critical care time) Labs Review Labs Reviewed  CBC - Abnormal; Notable for the following:    WBC 12.5 (*)    All other components within normal limits  DIFFERENTIAL - Abnormal; Notable for the following:    Neutrophils Relative % 83 (*)    Neutro Abs 10.3 (*)    Lymphocytes Relative 11 (*)    All other components within normal limits  COMPREHENSIVE METABOLIC PANEL - Abnormal; Notable for the following:    Glucose, Bld 169 (*)    All other components within normal limits  URINALYSIS, ROUTINE W REFLEX MICROSCOPIC (NOT AT [facility]) - Abnormal; Notable for the following:    Protein, ur 30 (*)    All other components within normal limits  HEMOGLOBIN A1C - Abnormal; Notable for the following:    Hgb A1c MFr Bld 6.6 (*)    All other components within normal limits  TROPONIN I - Abnormal; Notable for the following:    Troponin I 1.40 (*)    All other components within normal limits  TROPONIN I - Abnormal; Notable for the following:    Troponin I 1.46 (*)    All  other components within normal limits  TROPONIN I - Abnormal; Notable for the following:    Troponin I 0.99 (*)    All other components within normal limits  BASIC METABOLIC PANEL - Abnormal; Notable for the following:    Glucose, Bld 118 (*)    Calcium 8.8 (*)    All other components within normal limits  CBC - Abnormal; Notable for the following:  WBC 10.8 (*)    All other components within normal limits  GLUCOSE, CAPILLARY - Abnormal; Notable for the following:    Glucose-Capillary 107 (*)    All other components within normal limits  GLUCOSE, CAPILLARY - Abnormal; Notable for the following:    Glucose-Capillary 102 (*)    All other components within normal limits  TROPONIN I - Abnormal; Notable for the following:    Troponin I 0.84 (*)    All other components within normal limits  TROPONIN I - Abnormal; Notable for the following:    Troponin I 0.81 (*)    All other components within normal limits  TROPONIN I - Abnormal; Notable for the following:    Troponin I 0.78 (*)    All other components within normal limits  GLUCOSE, CAPILLARY - Abnormal; Notable for the following:    Glucose-Capillary 120 (*)    All other components within normal limits  GLUCOSE, CAPILLARY - Abnormal; Notable for the following:    Glucose-Capillary 115 (*)    All other components within normal limits  GLUCOSE, CAPILLARY - Abnormal; Notable for the following:    Glucose-Capillary 108 (*)    All other components within normal limits  GLUCOSE, CAPILLARY - Abnormal; Notable for the following:    Glucose-Capillary 129 (*)    All other components within normal limits  GLUCOSE, CAPILLARY - Abnormal; Notable for the following:    Glucose-Capillary 181 (*)    All other components within normal limits  GLUCOSE, CAPILLARY - Abnormal; Notable for the following:    Glucose-Capillary 108 (*)    All other components within normal limits  GLUCOSE, CAPILLARY - Abnormal; Notable for the following:     Glucose-Capillary 156 (*)    All other components within normal limits  GLUCOSE, CAPILLARY - Abnormal; Notable for the following:    Glucose-Capillary 129 (*)    All other components within normal limits  GLUCOSE, CAPILLARY - Abnormal; Notable for the following:    Glucose-Capillary 112 (*)    All other components within normal limits  I-STAT CHEM 8, ED - Abnormal; Notable for the following:    Glucose, Bld 169 (*)    All other components within normal limits  I-STAT TROPOININ, ED - Abnormal; Notable for the following:    Troponin i, poc 0.49 (*)    All other components within normal limits  ETHANOL  PROTIME-INR  APTT  URINE RAPID DRUG SCREEN, HOSP PERFORMED  URINE MICROSCOPIC-ADD ON  TSH  LIPID PANEL    Imaging Review Ct Head Wo Contrast  05/06/2015   CLINICAL DATA:  Follow-up right cerebellar stroke.  EXAM: CT HEAD WITHOUT CONTRAST  TECHNIQUE: Contiguous axial images were obtained from the base of the skull through the vertex without intravenous contrast.  COMPARISON:  Brain MRI and CT 05/04/2015  FINDINGS: There is increasing cytotoxic edema throughout the right cerebellum in the SCA territory corresponding to the acute infarct described on yesterday's MRI. There is no associated parenchymal hemorrhage. There is increased mass effect on the superior portion of the fourth ventricle which is now partially effaced. Edema involves the right superior and middle cerebellar peduncle is with mild mass effect on the posterior pons. Lateral and third ventricles are unchanged in size without evidence of hydrocephalus.  There is no evidence of acute supratentorial large territory infarct, mass, midline shift, or extra-axial fluid collection. There is mild generalized cerebral atrophy. Hypodensities in the deep cerebral white matter are again noted and compatible with moderate chronic small vessel  ischemic disease. A chronic left basal ganglia lacunar infarct is noted.  Prior bilateral cataract  extraction is noted. The visualized paranasal sinuses and mastoid air cells are clear. Calcified atherosclerosis is noted at the skullbase.  IMPRESSION: Evolving, large right SCA territory cerebellar infarct with increasing cytotoxic edema and partial effacement of the fourth ventricle. No hydrocephalus.   Electronically Signed   By: Logan Bores   On: 05/06/2015 07:58   I, Threasa Beards, personally reviewed and evaluated these images and lab results as part of my medical decision-making.   EKG Interpretation   Date/Time:  Friday May 04 2015 12:49:55 EDT Ventricular Rate:  64 PR Interval:  188 QRS Duration: 102 QT Interval:  446 QTC Calculation: 460 R Axis:   -79 Text Interpretation:  Sinus rhythm Left anterior fascicular block Abnormal  R-wave progression, early transition Nonspecific T abnrm, anterolateral  leads No significant change since last tracing Confirmed by Canary Brim  MD,  Fenris Cauble [phone]) on 05/04/2015 1:16:04 PM      MDM   Final diagnoses:  Dyspnea  Slurred speech  Stroke    Pt presenting with c/o confusion, slurred speech- family found her like this this morning- per family yesterday she was able to carry on fluent conversation.  Stroke workup initiated, including urine and UDS.  Troponin is elevated, EKG reassuring.  Head CT negative.  Pt given aspirin.    2:51 PM d/w Dr. Nicole Kindred, neurology, he recommends MRI- which has been ordered.  3:14 PM neurology has seen patient 3:56 PM d/w cardiology, they will see patient in consultation 4:41 PM d/w [facility], they will see patient for admission, pt to go to telemetry bed.   Alfonzo Beers, MD 05/07/15 [phone]

## 2015-05-04 NOTE — ED Notes (Signed)
Attempted to call 6E to give report. No answer.

## 2015-05-05 ENCOUNTER — Inpatient Hospital Stay (HOSPITAL_COMMUNITY): Payer: Medicare Other

## 2015-05-05 DIAGNOSIS — I6789 Other cerebrovascular disease: Secondary | ICD-10-CM

## 2015-05-05 DIAGNOSIS — I6350 Cerebral infarction due to unspecified occlusion or stenosis of unspecified cerebral artery: Secondary | ICD-10-CM

## 2015-05-05 DIAGNOSIS — R931 Abnormal findings on diagnostic imaging of heart and coronary circulation: Secondary | ICD-10-CM

## 2015-05-05 LAB — BASIC METABOLIC PANEL
Anion gap: 11 (ref 5–15)
BUN: 15 mg/dL (ref 6–20)
CO2: 23 mmol/L (ref 22–32)
CREATININE: 0.86 mg/dL (ref 0.44–1.00)
Calcium: 8.8 mg/dL — ABNORMAL LOW (ref 8.9–10.3)
Chloride: 104 mmol/L (ref 101–111)
GFR calc Af Amer: >60 mL/min (ref >60)
GFR calc non Af Amer: >60 mL/min (ref >60)
Glucose, Bld: 118 mg/dL — ABNORMAL HIGH (ref 65–99)
Potassium: 4.2 mmol/L (ref 3.5–5.1)
Sodium: 138 mmol/L (ref 135–145)

## 2015-05-05 LAB — GLUCOSE, CAPILLARY
GLUCOSE-CAPILLARY: 120 mg/dL — AB (ref 65–99)
GLUCOSE-CAPILLARY: 115 mg/dL — AB (ref 65–99)
Glucose-Capillary: 102 mg/dL — ABNORMAL HIGH (ref 65–99)
Glucose-Capillary: 108 mg/dL — ABNORMAL HIGH (ref 65–99)

## 2015-05-05 LAB — TROPONIN I
TROPONIN I: 0.78 ng/mL — AB (ref <0.031)
Troponin I: 1.46 ng/mL (ref <0.031)
Troponin I: 0.84 ng/mL (ref <0.031)
Troponin I: 0.99 ng/mL (ref <0.031)
Troponin I: 0.81 ng/mL (ref <0.031)

## 2015-05-05 LAB — CBC
HCT: 37.6 % (ref 36.0–46.0)
Hemoglobin: 12.8 g/dL (ref 12.0–15.0)
MCH: 28.6 pg (ref 26.0–34.0)
MCHC: 34 g/dL (ref 30.0–36.0)
MCV: 84.1 fL (ref 78.0–100.0)
Platelets: 276 10*3/uL (ref 150–400)
RBC: 4.47 MIL/uL (ref 3.87–5.11)
RDW: 15.2 % (ref 11.5–15.5)
WBC: 10.8 10*3/uL — AB (ref 4.0–10.5)

## 2015-05-05 LAB — LIPID PANEL
CHOLESTEROL: 162 mg/dL (ref 0–200)
HDL: 45 mg/dL (ref >40)
LDL Cholesterol: 88 mg/dL (ref 0–99)
TRIGLYCERIDES: 147 mg/dL (ref <150)
Total CHOL/HDL Ratio: 3.6 RATIO
VLDL: 29 mg/dL (ref 0–40)

## 2015-05-05 MED ORDER — CARVEDILOL 3.125 MG PO TABS
3.1250 mg | ORAL_TABLET | Freq: Two times a day (BID) | ORAL | Status: DC
  Administered 2015-05-05 – 2015-05-07 (×4): 3.125 mg via ORAL
  Filled 2015-05-05 (×5): qty 1

## 2015-05-05 NOTE — Care Management Note (Signed)
Case Management Note  Patient Details  Name: Cathy Taylor MRN: [id number] Date of Birth: 1933/06/27  Subjective/Objective:                    Action/Plan:   Expected Discharge Date:                  Expected Discharge Plan:     In-House Referral:     Discharge planning Services     Post Acute Care Choice:    Choice offered to:     DME Arranged:    DME Agency:     HH Arranged:    [place] Agency:     Status of Service:     Medicare Important Message Given:   yes Date Medicare IM Given:   05/05/15 Medicare IM give by:   Frann Rider, RN, BSN Date Additional Medicare IM Given:    Additional Medicare Important Message give by:     If discussed at [place] of Stay Meetings, dates discussed:    Additional Comments:  Norina Buzzard, RN 05/05/2015, 3:52 PM

## 2015-05-05 NOTE — Evaluation (Signed)
Physical Therapy Evaluation Patient Details Name: Cathy Taylor MRN: [id number] DOB: 06-09-1933 Today's Date: 05/05/2015   History of Present Illness  Patient is a 81 yo female admitted 05/04/15 with slurred speech, confusion, dizziness.  MRI showed large Rt SCA territory cerebellar infarct.    Clinical Impression  Patient presents with problems listed below.  Will benefit from acute PT to maximize functional mobility prior to discharge.  Patient was independent pta.  Recommend Inpatient Rehab consult to return patient to highest functional level.    Follow Up Recommendations CIR;Supervision/Assistance - 24 hour    Equipment Recommendations  None recommended by PT    Recommendations for Other Services Rehab consult     Precautions / Restrictions Precautions Precautions: Fall Precaution Comments: dizziness Restrictions Weight Bearing Restrictions: No      Mobility  Bed Mobility Overal bed mobility: Needs Assistance Bed Mobility: Rolling;Sidelying to Sit;Sit to Sidelying Rolling: Min assist Sidelying to sit: Max assist     Sit to sidelying: Max assist General bed mobility comments: Verbal cues for technique.  Assist to move LE's off of bed and to raise trunk to upright position.  Patient required max assist to scoot forward to EOB in sitting.  Initially required assist for sitting balance due to posterior lean.  Improved to min guard assist for static sitting.  Patient reports feeling dizzy/off balance.  Assist to bring LE's onto bed to return to sidelying.  Transfers Overall transfer level: Needs assistance Equipment used: Rolling walker (2 wheeled);None Transfers: Sit to/from [facility] Sit to Stand: Max assist Stand pivot transfers: Max assist;+2 safety/equipment       General transfer comment: Verbal cues for hand placement.  Max assist to power up to standing.  Patient with poor balance in static stance, with posterior lean.  Worked on shifting weight  forward over feet.  After sitting rest break, patient transferred bed <> BSC with max assist and +1 to manage underpants.  Ambulation/Gait                Stairs            Wheelchair Mobility    Modified Rankin (Stroke Patients Only) Modified Rankin (Stroke Patients Only) Pre-Morbid Rankin Score: No symptoms Modified Rankin: Severe disability     Balance Overall balance assessment: Needs assistance Sitting-balance support: Single extremity supported;Feet supported Sitting balance-Leahy Scale: Fair   Postural control: Posterior lean Standing balance support: Bilateral upper extremity supported Standing balance-Leahy Scale: Poor                               Pertinent Vitals/Pain Pain Assessment: No/denies pain    Home Living Family/patient expects to be discharged to:: Private residence Living Arrangements: Spouse/significant other Available Help at Discharge: Family Type of Home: House Home Access: Ramped entrance     Home Layout: One level Home Equipment: Environmental consultant - 2 wheels;Walker - 4 wheels;Cane - single point;Bedside commode;Wheelchair - manual      Prior Function Level of Independence: Independent with assistive device(s)         Comments: Reports she uses cane at times     Hand Dominance   Dominant Hand: Right    Extremity/Trunk Assessment   Upper Extremity Assessment: Generalized weakness;RUE deficits/detail RUE Deficits / Details: Movements ataxic, with attempt to do finger-to-nose and to manage holding onto RW.         Lower Extremity Assessment: Generalized weakness;RLE deficits/detail RLE Deficits / Details:  Movements ataxic, with heel-to-shin       Communication   Communication: Expressive difficulties  Cognition Arousal/Alertness: Awake/alert Behavior During Therapy: WFL for tasks assessed/performed;Anxious Overall Cognitive Status: Within Functional Limits for tasks assessed                       General Comments      Exercises        Assessment/Plan    PT Assessment Patient needs continued PT services  PT Diagnosis Difficulty walking;Generalized weakness;Hemiplegia dominant side   PT Problem List Decreased strength;Decreased activity tolerance;Decreased balance;Decreased mobility;Decreased coordination;Decreased knowledge of use of DME  PT Treatment Interventions DME instruction;Gait training;Functional mobility training;Therapeutic activities;Balance training;Neuromuscular re-education;Patient/family education   PT Goals (Current goals can be found in the Care Plan section) Acute Rehab PT Goals Patient Stated Goal: To walk PT Goal Formulation: With patient/family Time For Goal Achievement: 05/19/15 Potential to Achieve Goals: Good    Frequency Min 4X/week   Barriers to discharge Decreased caregiver support 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 Sanjuana Kava, [place] Pager [phone]

## 2015-05-05 NOTE — Evaluation (Signed)
Speech Language Pathology Evaluation Patient Details Name: Cathy Taylor MRN: [id number] DOB: 09/29/1932 Today's Date: 05/05/2015 Time: 0865-7846 SLP Time Calculation (min) (ACUTE ONLY): 18 min  Problem List:  Patient Active Problem List   Diagnosis Date Noted  . Altered mental status 05/04/2015  . Vertigo 05/04/2015  . Slurred speech 05/04/2015  . Stroke 05/04/2015  . Encephalopathy acute   . CVA (cerebral vascular accident)   . Memory loss 06/01/2014  . Numbness 06/01/2014  . Osteoarthritis of left shoulder 01/06/2012  . Bronchiectasis 10/10/2011  . Hyponatremia 09/12/2011  . Dyspnea 09/10/2011  . Hypertension 09/10/2011  . Diabetes mellitus 09/10/2011  . PMR (polymyalgia rheumatica) 09/10/2011  . Breast cancer 09/10/2011   Past Medical History:  Past Medical History  Diagnosis Date  . Stress fracture of left femur with nonunion 07/08/2011  . PMR (polymyalgia rheumatica)   . GERD (gastroesophageal reflux disease)   . Anxiety   . Hypertension   . Abdominal hernia   . Hypercholesteremia   . Heart murmur   . Chronic back pain greater than 3 months duration   . Bronchiectasis 11/12  . 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 Depression   . Bronchitis     "that's what I'm here for"  . Type II diabetes mellitus   . Umbilical hernia 9/62/95    unrepaired  . Breast cancer 1973    right  . Arthritis     "everywhere  . Osteoarthritis of left shoulder 01/06/2012   Past Surgical History:  Past Surgical History  Procedure Laterality Date  . Tubal ligation    . Cataract extraction w/ intraocular lens implant  07/2011    left  . Shoulder arthroscopy  06/2000    left  . Femur surgery  07/07/2011    left rod placed  . Total shoulder arthroplasty  01/06/12    left  . Breast biopsy  1981    left  . Mastectomy  1973    right  . Total shoulder arthroplasty  01/06/2012    Procedure: TOTAL SHOULDER ARTHROPLASTY;  Surgeon: Johnny Bridge, MD;  Location: [facility];  Service:  Orthopedics;  Laterality: Left;   HPI:  Patient is a 81 year old female history of type 2 diabetes, polymyalgia rheumatica, history of breast cancer primary caregiver for her husband with history of Parkinson's who presents to the ED with acute encephalopathy and slurred speech.  One day prior to admission patient did not feel too well and felt very fatigued. On the morning of admission patient woke up with vertiginous symptoms, diaphoresis, pinpoint pupils, generalized weakness and slurred incomprehensible speech. Patient's daughter tried calling the house several times however had no answer. Aid who came to the house to help care for patient's husband arrived around 8 AM when she found patient in a state of confusion with dysarthric/slurred speech. Patient was subsequently taken to her PCPs office from when she was sent to the ED for further evaluation. Patient also noted to have an elevated troponin. CT head was negative. EKG with nonspecific ST-T wave changes similar to prior EKG. Point-of-care troponin was elevated. Patient with borderline blood pressure. Neurology and cardiology were consulted.  Subsequent MRI showing Acute, large right SCA territory cerebellar infarct.   Assessment / Plan / Recommendation Clinical Impression  Cognitive/linguistic evaluation was completed.  The patient scored a 26/30 on the Mini Mental State Exam.   Her language and cognitive skills appear to be functional.  Issues were noted for design copy and  following a 3 step command.  Oral motor exam indicated good lingual and labial ROM and strength.  Diodochokinetic rates are poor and the patient is midly dysarthric.  Intelligiblity is only mildly reduced.  Recommend follow up at this and next level of care to address dysarthria.      SLP Assessment  Patient needs continued Speech Lanaguage Pathology Services    Follow Up Recommendations  Home health SLP/Outpatient SLP vs Inpatient Rehab    Frequency and Duration min 1  x/week  2 weeks   Pertinent Vitals/Pain Pain Assessment: No/denies pain   SLP Goals  Patient/Family Stated Goal: To go home.   Potential to Achieve Goals (ACUTE ONLY): Good  SLP Evaluation Prior Functioning  Cognitive/Linguistic Baseline: Within functional limits Type of Home: House  Lives With: Spouse (She is primary caregiver for her spouse.  ) Available Help at Discharge: Family   Cognition  Overall Cognitive Status: Within Functional Limits for tasks assessed Arousal/Alertness: Awake/alert Orientation Level: Oriented X4 Attention: Sustained Sustained Attention: Appears intact Memory: Appears intact Awareness: Appears intact Executive Function: Organizing Organizing: Appears intact Safety/Judgment: Appears intact    Comprehension  Auditory Comprehension Overall Auditory Comprehension: Appears within functional limits for tasks assessed Commands: Within Functional Limits Conversation: Complex Reading Comprehension Reading Status: Within funtional limits    Expression Expression Primary Mode of Expression: Verbal Verbal Expression Overall Verbal Expression: Appears within functional limits for tasks assessed Initiation: No impairment Automatic Speech: Name;Social Response Level of Generative/Spontaneous Verbalization: Conversation Repetition: No impairment Naming: No impairment Pragmatics: No impairment Non-Verbal Means of Communication: Not applicable Written Expression Dominant Hand: Right Written Expression: Within Functional Limits (Legibility is poor.  )   Oral / Motor Oral Motor/Sensory Function Overall Oral Motor/Sensory Function: Appears within functional limits for tasks assessed Labial ROM: Within Functional Limits Labial Symmetry: Within Functional Limits Labial Strength: Within Functional Limits Lingual ROM: Within Functional Limits Lingual Symmetry: Within Functional Limits Lingual Strength: Within Functional Limits Facial ROM: Within Functional  Limits Facial Symmetry: Within Functional Limits Facial Strength: Within Functional Limits Mandible: Within Functional Limits Motor Speech Overall Motor Speech: Impaired Respiration: Within functional limits Phonation: Normal Resonance: Within functional limits Articulation: Impaired Level of Impairment: Conversation Intelligibility: Intelligibility reduced Word: 75-100% accurate Phrase: 75-100% accurate Sentence: 75-100% accurate Conversation: 75-100% accurate Motor Planning: Witnin functional limits Motor Speech Errors: Not applicable   GO     Lamar Sprinkles 05/05/2015, 12:12 PM Shelly Flatten, [place], [place] [phone]

## 2015-05-05 NOTE — Progress Notes (Signed)
Subjective:   81 y.o. female w/ PMH of HTN, HLD, GERD, Type 2 DM, PMR, Breast Cancer and Anxiety who presented to the [facility] ED on 05/04/15 via EMS for altered mental status and slurred speech. No h/o known CAD.    MRI/MRA brain with: Occlusion of the right superior cerebellar artery proximally, consistent with previously identified acute large right SCA territory Infarct.  Denies CP or dyspnea. Much more alert.. Troponin 1.4 -> 0.9. ECG with NSR No ST-T wave abnormalities.   Bedside echo with EF ~35% with antero and apical hypokinesis     Intake/Output Summary (Last 24 hours) at 05/05/15 1423 Last data filed at 05/05/15 1405  Gross per 24 hour  Intake    600 ml  Output      0 ml  Net    600 ml    Current meds: . aspirin  300 mg Rectal Daily   Or  . aspirin  325 mg Oral Daily  . DULoxetine  30 mg Oral Daily  . enoxaparin (LOVENOX) injection  40 mg Subcutaneous Q24H  . insulin aspart  0-9 Units Subcutaneous TID WC  . insulin glargine  5 Units Subcutaneous QHS  . pantoprazole  40 mg Oral Daily  . simvastatin  40 mg Oral QHS   Infusions: . sodium chloride 75 mL/hr at 05/04/15 2129     Objective:  Blood pressure 114/64, pulse 73, temperature 98.6 F (37 C), temperature source Oral, resp. rate 20, height 5\' 3"  (1.6 m), weight 77.2 kg (170 lb 3.1 oz), SpO2 96 %. Weight change:   Physical Exam: General:  Well appearing. No resp difficulty HEENT: normal Neck: supple. JVP 6 . Carotids 2+ bilat; no bruits. No lymphadenopathy or thryomegaly appreciated. Cor: PMI nondisplaced. Regular rate & rhythm. No rubs, gallops or murmurs. Lungs: clear Abdomen: soft, nontender, nondistended. No hepatosplenomegaly. No bruits or masses. Good bowel sounds. Extremities: no cyanosis, clubbing, rash, edema Neuro: alert & orientedx3, cranial nerves grossly intact. moves all 4 extremities w/o difficulty. Affect pleasant  Telemetry: NSR  Lab Results: Basic Metabolic Panel:  Recent  Labs Lab 05/04/15 1344 05/04/15 1407 05/05/15 1007  NA 140 141 138  K 4.5 4.6 4.2  CL 105 107 104  CO2  --  23 23  GLUCOSE 169* 169* 118*  BUN 20 14 15   CREATININE 0.70 0.83 0.86  CALCIUM  --  9.9 8.8*   Liver Function Tests:  Recent Labs Lab 05/04/15 1407  AST 26  ALT 14  ALKPHOS 71  BILITOT 0.6  PROT 8.1  ALBUMIN 3.9   No results for input(s): LIPASE, AMYLASE in the last 168 hours. No results for input(s): AMMONIA in the last 168 hours. CBC:  Recent Labs Lab 05/04/15 1344 05/04/15 1407 05/05/15 1007  WBC  --  12.5* 10.8*  NEUTROABS  --  10.3*  --   HGB 14.3 13.4 12.8  HCT 42.0 40.0 37.6  MCV  --  84.7 84.1  PLT  --  270 276   Cardiac Enzymes:  Recent Labs Lab 05/04/15 2106 05/05/15 0224 05/05/15 0825 05/05/15 1007  TROPONINI 1.40* 1.46* 0.84* 0.99*   BNP: Invalid input(s): POCBNP CBG:  Recent Labs Lab 05/04/15 2051 05/05/15 0634 05/05/15 1126  GLUCAP 107* 102* 120*   Microbiology: Lab Results  Component Value Date   CULT NO GROWTH 5 DAYS 09/10/2011   CULT NO GROWTH 5 DAYS 09/10/2011   No results for input(s): CULT, SDES in the last 168 hours.  Imaging: Dg Chest  2 View  05/04/2015   CLINICAL DATA:  Shortness of breath and cough  EXAM: CHEST - 2 VIEW  COMPARISON:  04/09/2012  FINDINGS: Cardiac shadow is mildly enlarged. Aortic atherosclerotic change is again seen. Postsurgical changes left shoulder are noted. The lungs are well aerated bilaterally without focal infiltrate. Degenerative changes of the thoracic spine are noted.  IMPRESSION: No acute abnormality noted.   Electronically Signed   By: Inez Catalina M.D.   On: 05/04/2015 19:31   Ct Head Wo Contrast  05/04/2015   CLINICAL DATA:  81YOF arrives from [facility] via [place]. Pt found around 0800 this morning unable to carry out her normal activities and acting confused with slurred speech. Pt has c/o dizziness, nausea and diaphoresis. Pt has pinpoint pupils and upon arrival to ED has  slurred/incomprehensible speech and is aroused by speech.  EXAM: CT HEAD WITHOUT CONTRAST  TECHNIQUE: Contiguous axial images were obtained from the base of the skull through the vertex without intravenous contrast.  COMPARISON:  Brain MRI, 06/06/2014.  Head CT, 04/24/2011.  FINDINGS: Ventricles are normal in configuration. There is ventricular and sulcal enlargement reflecting mild atrophy. There is a focus of hypoattenuation along the anterior left basal ganglia and anterior limb of the left internal capsule consistent with old lacune infarct, stable from the prior brain MRI. Other smaller foci of hypoattenuation in the base a ganglia suggests additional small lacune infarcts. There are no parenchymal masses or mass effect. There is no evidence of a recent cortical infarct. Patchy white matter hypoattenuation is noted consistent with moderate chronic microvascular ischemic change.  There are no extra-axial masses or abnormal fluid collections.  There is no intracranial hemorrhage.  Visualized sinuses and mastoid air cells are clear.  IMPRESSION: 1. No acute intracranial abnormality 2. Mild atrophy and moderate chronic microvascular ischemic change. 3. Small basal gangliar lacune infarcts.   Electronically Signed   By: Lajean Manes M.D.   On: 05/04/2015 13:54   Mr Brain Wo Contrast  05/04/2015   CLINICAL DATA:  Dysarthria. Decreased mental status. Dizziness, nausea, and diaphoresis.  EXAM: MRI HEAD WITHOUT CONTRAST  TECHNIQUE: Multiplanar, multiecho pulse sequences of the brain and surrounding structures were obtained without intravenous contrast.  COMPARISON:  Head CT 05/04/2015 and MRI 06/06/2014  FINDINGS: There is an acute right cerebellar infarct involving essentially the entirety of the SCA territory. The right superior cerebellar peduncle and a portion of the middle cerebellar peduncle are involved. There is associated cytotoxic edema without significant mass effect.  No intracranial hemorrhage, mass,  midline shift, or extra-axial fluid collection is seen. Patchy and confluent T2 hyperintensities in the subcortical and deep cerebral white matter are similar to the prior MRI and nonspecific but compatible with moderate chronic small vessel ischemic disease. Mild chronic ischemic changes are also noted in the pons. There is mild cerebral atrophy. Chronic basal ganglia lacunar infarcts are again noted.  Prior bilateral cataract extraction is noted. Paranasal sinuses and mastoid air cells are clear. Major intracranial vascular flow voids are preserved.  IMPRESSION: 1. Acute, large right SCA territory cerebellar infarct. 2. Moderate chronic small vessel ischemic disease.   Electronically Signed   By: Logan Bores   On: 05/04/2015 18:45   Mr Jodene Nam Head/brain Wo Cm  05/05/2015   CLINICAL DATA:  Initial evaluation for acute stroke.  EXAM: MRA HEAD WITHOUT CONTRAST  TECHNIQUE: Angiographic images of the Circle of Willis were obtained using MRA technique without intravenous contrast.  COMPARISON:  Prior MRI from earlier  the same day.  FINDINGS: ANTERIOR CIRCULATION:  Visualized distal cervical segments of the internal carotid arteries are patent with antegrade flow. Petrous, cavernous, and supra clinoid segments are well opacified bilaterally. Short segment mild stenosis within the supraclinoid left ICA (series 301, image 16). A1 segments patent. Right A1 segment is somewhat hypoplastic. Anterior communicating artery and anterior cerebral arteries well opacified.  M1 segments widely patent without occlusion. Short-segment mild stenosis within the mid -distal left M1 segment (series 303, image 13). MCA bifurcations within normal limits. Distal MCA branches opacified bilaterally and grossly symmetric.  POSTERIOR CIRCULATION:  Vertebral arteries are patent to the vertebrobasilar junction. Posterior inferior cerebellar arteries not well visualized on this exam. Basilar artery widely patent. Left superior cerebellar artery  patent. There appears to be occlusion of the right superior cerebellar artery proximally. Left P1 and P2 segments widely patent. Fetal origin of the right PCA with widely patent right posterior communicating artery. Distal branch atheromatous irregularity within the posterior cerebral arteries bilaterally.  No aneurysm or vascular malformation.  IMPRESSION: 1. Occlusion of the right superior cerebellar artery proximally, consistent with previously identified acute right SCA territory infarct. 2. No other large or proximal arterial branch occlusion within the intracranial circulation. 3. Short segment mild stenosis within the mid- distal left M1 segment. 4. Short segment mild stenosis within the supraclinoid left ICA. 5. Distal branch atheromatous irregularity within the posterior cerebral arteries bilaterally. 6. Fetal origin of the right PCA.   Electronically Signed   By: Jeannine Boga M.D.   On: 05/05/2015 00:40     ASSESSMENT:  1. Elevated troponin 2. Acute CVA 3. LV dysfunction with EF ~35% with anterior and apical HK  4. HTN   PLAN/DISCUSSION:  Echo shows significant LV dysfunction in a pattern that may be stress-induced (Tako-tsubo) cardiomyopathy. She is currently asymptomatic from cardiac perspective. Troponin elevation is mild and does not reflect acute coronary syndrome. CVA appears embolic so concern is for cardio-embolism. I do not see apical clot on TTE but sensitivity is poor. (Hard to know if cardiac dysfunction preceded CVA or is a result of the stress of CVA.)  At this point would continue ASA and start b-blocker and ACE as BP tolerates. Can add Plavix if neurology feels appropriate. Watch monitor for presence of AF. Will plan TEE Monday. Consider ILR.   Given recent CVA and lack of cardiac symptoms, would prefer to defer cardiac cath several weeks, if possible. Would repeat echo in several weeks to look for improvement in LVEF (stress-induced CM will resolve quickly).   We  will follow.   Bensimhon, Daniel,MD 2:44 PM    LOS: 1 day

## 2015-05-05 NOTE — Progress Notes (Addendum)
VASCULAR LAB PRELIMINARY  PRELIMINARY  PRELIMINARY  PRELIMINARY  Carotid duplex completed.    Preliminary report:  Bilateral:  40% to 59% ICA stenosis however acoustic shodoing at the bifurcations may obscure higher velocities.  Vertebral artery flow is antegrade.     Yemariam Magar, RVS 05/05/2015, 7:02 PM

## 2015-05-05 NOTE — Progress Notes (Addendum)
[facility] PROGRESS NOTE  Cathy Taylor ZJQ:[id number] DOB: 1932/12/04 DOA: 05/04/2015 PCP: Kandice Hams, MD  Assessment/Plan:  Principal Problem:   Acute ischemic stroke Active Problems:   Diabetes mellitus   PMR (polymyalgia rheumatica)   Breast cancer   Altered mental status   Vertigo   Encephalopathy acute   CVA (cerebral vascular accident) elevated troponin: now trending down. Echo pending  Workup underway.  Neurology following.  Might need placement. Await PT, OT, ST  Code Status:  DNR Family Communication:  >10 at bedside Disposition Plan:  ?  HPI/Subjective:  Speech a little better. Tired. Has not yet been oob Objective: Filed Vitals:   05/05/15 0926  BP: 123/61  Pulse: 70  Temp: 98.1 F (36.7 C)  Resp: 16    Intake/Output Summary (Last 24 hours) at 05/05/15 1304 Last data filed at 05/05/15 0929  Gross per 24 hour  Intake    480 ml  Output      0 ml  Net    480 ml   Filed Weights   05/04/15 2047  Weight: 77.2 kg (170 lb 3.1 oz)    Exam:   General:  Asleep arousable appropriate  Cardiovascular: RRR without MGR  Respiratory: CTA without WRR  Abdomen: S, NT, ND  Ext: no CCE  Neuro: slightly slurred speech. Right hand slightly week. Leg strength ok  Basic Metabolic Panel:  Recent Labs Lab 05/04/15 1344 05/04/15 1407 05/05/15 1007  NA 140 141 138  K 4.5 4.6 4.2  CL 105 107 104  CO2  --  23 23  GLUCOSE 169* 169* 118*  BUN 20 14 15   CREATININE 0.70 0.83 0.86  CALCIUM  --  9.9 8.8*   Liver Function Tests:  Recent Labs Lab 05/04/15 1407  AST 26  ALT 14  ALKPHOS 71  BILITOT 0.6  PROT 8.1  ALBUMIN 3.9   No results for input(s): LIPASE, AMYLASE in the last 168 hours. No results for input(s): AMMONIA in the last 168 hours. CBC:  Recent Labs Lab 05/04/15 1344 05/04/15 1407 05/05/15 1007  WBC  --  12.5* 10.8*  NEUTROABS  --  10.3*  --   HGB 14.3 13.4 12.8  HCT 42.0 40.0 37.6  MCV  --  84.7 84.1  PLT  --  270  276   Cardiac Enzymes:  Recent Labs Lab 05/04/15 2106 05/05/15 0825 05/05/15 1007  TROPONINI 1.40* 0.84* 0.99*   BNP (last 3 results) No results for input(s): BNP in the last 8760 hours.  ProBNP (last 3 results) No results for input(s): PROBNP in the last 8760 hours.  CBG:  Recent Labs Lab 05/04/15 2051 05/05/15 0634 05/05/15 1126  GLUCAP 107* 102* 120*    No results found for this or any previous visit (from the past 240 hour(s)).   Studies: Dg Chest 2 View  05/04/2015   CLINICAL DATA:  Shortness of breath and cough  EXAM: CHEST - 2 VIEW  COMPARISON:  04/09/2012  FINDINGS: Cardiac shadow is mildly enlarged. Aortic atherosclerotic change is again seen. Postsurgical changes left shoulder are noted. The lungs are well aerated bilaterally without focal infiltrate. Degenerative changes of the thoracic spine are noted.  IMPRESSION: No acute abnormality noted.   Electronically Signed   By: Inez Catalina M.D.   On: 05/04/2015 19:31   Ct Head Wo Contrast  05/04/2015   CLINICAL DATA:  81YOF arrives from [facility] via [place]. Pt found around 0800 this morning unable to carry out her normal activities and  acting confused with slurred speech. Pt has c/o dizziness, nausea and diaphoresis. Pt has pinpoint pupils and upon arrival to ED has slurred/incomprehensible speech and is aroused by speech.  EXAM: CT HEAD WITHOUT CONTRAST  TECHNIQUE: Contiguous axial images were obtained from the base of the skull through the vertex without intravenous contrast.  COMPARISON:  Brain MRI, 06/06/2014.  Head CT, 04/24/2011.  FINDINGS: Ventricles are normal in configuration. There is ventricular and sulcal enlargement reflecting mild atrophy. There is a focus of hypoattenuation along the anterior left basal ganglia and anterior limb of the left internal capsule consistent with old lacune infarct, stable from the prior brain MRI. Other smaller foci of hypoattenuation in the base a ganglia suggests additional  small lacune infarcts. There are no parenchymal masses or mass effect. There is no evidence of a recent cortical infarct. Patchy white matter hypoattenuation is noted consistent with moderate chronic microvascular ischemic change.  There are no extra-axial masses or abnormal fluid collections.  There is no intracranial hemorrhage.  Visualized sinuses and mastoid air cells are clear.  IMPRESSION: 1. No acute intracranial abnormality 2. Mild atrophy and moderate chronic microvascular ischemic change. 3. Small basal gangliar lacune infarcts.   Electronically Signed   By: Lajean Manes M.D.   On: 05/04/2015 13:54   Mr Brain Wo Contrast  05/04/2015   CLINICAL DATA:  Dysarthria. Decreased mental status. Dizziness, nausea, and diaphoresis.  EXAM: MRI HEAD WITHOUT CONTRAST  TECHNIQUE: Multiplanar, multiecho pulse sequences of the brain and surrounding structures were obtained without intravenous contrast.  COMPARISON:  Head CT 05/04/2015 and MRI 06/06/2014  FINDINGS: There is an acute right cerebellar infarct involving essentially the entirety of the SCA territory. The right superior cerebellar peduncle and a portion of the middle cerebellar peduncle are involved. There is associated cytotoxic edema without significant mass effect.  No intracranial hemorrhage, mass, midline shift, or extra-axial fluid collection is seen. Patchy and confluent T2 hyperintensities in the subcortical and deep cerebral white matter are similar to the prior MRI and nonspecific but compatible with moderate chronic small vessel ischemic disease. Mild chronic ischemic changes are also noted in the pons. There is mild cerebral atrophy. Chronic basal ganglia lacunar infarcts are again noted.  Prior bilateral cataract extraction is noted. Paranasal sinuses and mastoid air cells are clear. Major intracranial vascular flow voids are preserved.  IMPRESSION: 1. Acute, large right SCA territory cerebellar infarct. 2. Moderate chronic small vessel  ischemic disease.   Electronically Signed   By: Logan Bores   On: 05/04/2015 18:45   Mr Jodene Nam Head/brain Wo Cm  05/05/2015   CLINICAL DATA:  Initial evaluation for acute stroke.  EXAM: MRA HEAD WITHOUT CONTRAST  TECHNIQUE: Angiographic images of the Circle of Willis were obtained using MRA technique without intravenous contrast.  COMPARISON:  Prior MRI from earlier the same day.  FINDINGS: ANTERIOR CIRCULATION:  Visualized distal cervical segments of the internal carotid arteries are patent with antegrade flow. Petrous, cavernous, and supra clinoid segments are well opacified bilaterally. Short segment mild stenosis within the supraclinoid left ICA (series 301, image 16). A1 segments patent. Right A1 segment is somewhat hypoplastic. Anterior communicating artery and anterior cerebral arteries well opacified.  M1 segments widely patent without occlusion. Short-segment mild stenosis within the mid -distal left M1 segment (series 303, image 13). MCA bifurcations within normal limits. Distal MCA branches opacified bilaterally and grossly symmetric.  POSTERIOR CIRCULATION:  Vertebral arteries are patent to the vertebrobasilar junction. Posterior inferior cerebellar arteries not well visualized  on this exam. Basilar artery widely patent. Left superior cerebellar artery patent. There appears to be occlusion of the right superior cerebellar artery proximally. Left P1 and P2 segments widely patent. Fetal origin of the right PCA with widely patent right posterior communicating artery. Distal branch atheromatous irregularity within the posterior cerebral arteries bilaterally.  No aneurysm or vascular malformation.  IMPRESSION: 1. Occlusion of the right superior cerebellar artery proximally, consistent with previously identified acute right SCA territory infarct. 2. No other large or proximal arterial branch occlusion within the intracranial circulation. 3. Short segment mild stenosis within the mid- distal left M1 segment.  4. Short segment mild stenosis within the supraclinoid left ICA. 5. Distal branch atheromatous irregularity within the posterior cerebral arteries bilaterally. 6. Fetal origin of the right PCA.   Electronically Signed   By: Jeannine Boga M.D.   On: 05/05/2015 00:40    Scheduled Meds: . aspirin  300 mg Rectal Daily   Or  . aspirin  325 mg Oral Daily  . DULoxetine  30 mg Oral Daily  . enoxaparin (LOVENOX) injection  40 mg Subcutaneous Q24H  . insulin aspart  0-9 Units Subcutaneous TID WC  . insulin glargine  5 Units Subcutaneous QHS  . pantoprazole  40 mg Oral Daily  . simvastatin  40 mg Oral QHS   Continuous Infusions: . sodium chloride 75 mL/hr at 05/04/15 2129    Time spent: 25 minutes  [facility] e at [email or web address], password [facility] 05/05/2015, 1:04 PM  LOS: 1 day

## 2015-05-05 NOTE — Progress Notes (Signed)
STROKE TEAM PROGRESS NOTE   HISTORY Cathy Taylor is an 81 y.o. female with a istory of hypertension, hyperlipidemia, diabetes mellitus, breast cancer and polymyalgia rheumatica, who arranged vertigo with nausea she was also noted to have slurred speech and confusion and was sent to the emergency room for evaluation from her Primary care physician's office. No focal weakness was noted. She did not experience diplopia. CT scan of her head showed no acute intracranial abnormality. She was noted to have pinpoint pupils on arrival in the emergency room. Urine drug screen is pending. Patient has not been on antiplatelets therapy. She has no history of stroke nor TIA. MRI of the brain showed a large acute right cerebellar ischemic infarction.  LSN: 05/03/2015 tPA Given: No: Beyond time window for treatment consideration mRankin:     SUBJECTIVE (INTERVAL HISTORY) HerDaughters are at the bedside.  Overall she feels her condition is unchanged.     OBJECTIVE Temp:  [97.9 F (36.6 C)-98.8 F (37.1 C)] 98.6 F (37 C) (08/13 1404) Pulse Rate:  [63-112] 73 (08/13 1404) Cardiac Rhythm:  [-] Normal sinus rhythm (08/12 2054) Resp:  [16-20] 20 (08/13 1404) BP: (94-123)/(51-65) 114/64 mmHg (08/13 1404) SpO2:  [96 %-99 %] 96 % (08/13 1404) Weight:  [77.2 kg (170 lb 3.1 oz)] 77.2 kg (170 lb 3.1 oz) (08/12 2047)   Recent Labs Lab 05/04/15 2051 05/05/15 0634 05/05/15 1126  GLUCAP 107* 102* 120*    Recent Labs Lab 05/04/15 1344 05/04/15 1407 05/05/15 1007  NA 140 141 138  K 4.5 4.6 4.2  CL 105 107 104  CO2  --  23 23  GLUCOSE 169* 169* 118*  BUN 20 14 15   CREATININE 0.70 0.83 0.86  CALCIUM  --  9.9 8.8*    Recent Labs Lab 05/04/15 1407  AST 26  ALT 14  ALKPHOS 71  BILITOT 0.6  PROT 8.1  ALBUMIN 3.9    Recent Labs Lab 05/04/15 1344 05/04/15 1407 05/05/15 1007  WBC  --  12.5* 10.8*  NEUTROABS  --  10.3*  --   HGB 14.3 13.4 12.8  HCT 42.0 40.0 37.6  MCV  --  84.7 84.1  PLT   --  270 276    Recent Labs Lab 05/04/15 2106 05/05/15 0224 05/05/15 0825 05/05/15 1007  TROPONINI 1.40* 1.46* 0.84* 0.99*    Recent Labs  05/04/15 1407  LABPROT 13.9  INR 1.05    Recent Labs  05/04/15 1535  COLORURINE YELLOW  LABSPEC 1.016  PHURINE 6.0  GLUCOSEU NEGATIVE  HGBUR NEGATIVE  BILIRUBINUR NEGATIVE  KETONESUR NEGATIVE  PROTEINUR 30*  UROBILINOGEN 1.0  NITRITE NEGATIVE  LEUKOCYTESUR NEGATIVE       Component Value Date/Time   CHOL 162 05/05/2015 0224   TRIG 147 05/05/2015 0224   HDL 45 05/05/2015 0224   CHOLHDL 3.6 05/05/2015 0224   VLDL 29 05/05/2015 0224   LDLCALC 88 05/05/2015 0224   No results found for: HGBA1C    Component Value Date/Time   LABOPIA NONE DETECTED 05/04/2015 1535   COCAINSCRNUR NONE DETECTED 05/04/2015 1535   LABBENZ NONE DETECTED 05/04/2015 1535   AMPHETMU NONE DETECTED 05/04/2015 1535   THCU NONE DETECTED 05/04/2015 1535   LABBARB NONE DETECTED 05/04/2015 1535     Recent Labs Lab 05/04/15 1339  ETH <5     IMAGING  Dg Chest 2 View 05/04/2015    No acute abnormality noted.     Ct Head Wo Contrast 05/04/2015    1. No acute intracranial  abnormality  2. Mild atrophy and moderate chronic microvascular ischemic change.  3. Small basal gangliar lacune infarcts.      Mr Brain Wo Contrast 05/04/2015    1. Acute, large right SCA territory cerebellar infarct.  2. Moderate chronic small vessel ischemic disease.    Mr Jodene Nam Head/brain Wo Cm 05/05/2015    1. Occlusion of the right superior cerebellar artery proximally, consistent with previously identified acute right SCA territory  infarct.  2. No other large or proximal arterial branch occlusion within the intracranial circulation.  3. Short segment mild stenosis within the mid- distal left M1 segment.  4. Short segment mild stenosis within the supraclinoid left ICA.  5. Distal branch atheromatous irregularity within the posterior cerebral arteries bilaterally.  6.  Fetal origin of the right PCA.      PHYSICAL EXAM  GENERAL: Pleasant in no acute distress.  HEENT: Normal.  ABDOMEN: soft  EXTREMITIES: No edema   BACK:Normal.  SKIN: Normal by inspection.    MENTAL STATUS: Alert and oriented. Language and cognition are generally intact. Judgment and insight normal. She does have a mild to moderate dysarthria.  CRANIAL NERVES: Pupils are equal, round and reactive to light and accomodation; extra ocular movements are full, there is no significant nystagmus; visual fields are full; upper and lower facial muscles are normal in strength and symmetric, there is no flattening of the nasolabial folds; tongue is midline; uvula is midline; shoulder elevation is normal.  MOTOR: Normal tone, bulk and strength - Except mild right deltoid weakness which appears to be chronic from shoulder issues; no pronator drift.  COORDINATION: Mild dysmetria in the right upper extremity and right lower extremity. Left upper extremity and left leg normal. No rest tremor; no intention tremor; no postural tremor; no bradykinesia.  GAIT: Normal.     Brain MRI is reviewed in person and shows a large acute infarcts involving the right cerebellum. There is mild cytotoxic edema causing mild effacement of the fourth ventricle. There is moderate global atrophy.   ASSESSMENT/PLAN [name] is a 81 y.o. female with history of polymyalgia rheumatica, hypertension, hyperlipidemia, and diabetes mellitus,  presenting with vertigo, nausea, slurred speech, and confusion. She did not receive IV t-PA due to late presentation. Mild effacement of the fourth ventricle from her infarct.  I do not expect the patient to have significant edema but a repeat head CT scan will be ordered for tomorrow. Continue with every 4 neuro checks.  Stroke:  Non-dominant infarct secondary to Large vessel intracranial occlusive disease  Resultant  Dysarthria, Right-sided dysmetria and dizziness.  MRI   Acute, large right SCA territory cerebellar infarct.   MRA  Occlusion of the right superior cerebellar artery proximally, consistent with previously identified acute right SCA territory infarct.   Carotid Doppler pending  2D Echo EF 30-35%. No cardiac source of emboli identified.  LDL 88  HgbA1c pending  Lovenox for VTE prophylaxis Diet Carb Modified Fluid consistency:: Thin; Room service appropriate?: Yes Diet NPO time specified  aspirin 81 mg orally every day prior to admission, now on aspirin 325 mg orally every day  Patient counseled to be compliant with her antithrombotic medications  Ongoing aggressive stroke risk factor management  Therapy recommendations: CIR recommended - consult rehabilitation M.D. (ordered)  Disposition:  Pending  Hypertension  Home meds: Benazepril hydrochlorothiazide diltiazem  Stable  Patient counseled to be compliant with her blood pressure medications  Hyperlipidemia  Home meds:  Zocor 40 mg daily resumed in hospital  LDL 88, goal < 70  Consider changing Zocor to Lipitor 80 mg daily  Continue statin at discharge  Diabetes  HgbA1c pending, goal < 7.0  Controlled  Other Stroke Risk Factors  Advanced age  Obesity, Body mass index is 30.16 kg/(m^2).   Family hx stroke (son)   Other Active Problems  Leukocytosis  Elevated cardiac enzymes - cardiology has been consult at and is following.  Left ventricular dysfunction     Hospital day # 1  Mikey Bussing [facility] Pager [phone] 05/05/2015, 4:38 PM   Dr. Merlene Laughter -- The patient was seen and examined by me; notes, chart and tests reviewed and discussed with midlevel provider, other providers, patient, and family.        To contact Stroke Continuity provider, please refer to [email or web address] After hours, contact General Neurology

## 2015-05-05 NOTE — Progress Notes (Signed)
  Echocardiogram 2D Echocardiogram has been performed.  Cathy Taylor 05/05/2015, 2:41 PM

## 2015-05-06 ENCOUNTER — Inpatient Hospital Stay (HOSPITAL_COMMUNITY): Payer: Medicare Other

## 2015-05-06 DIAGNOSIS — R778 Other specified abnormalities of plasma proteins: Secondary | ICD-10-CM

## 2015-05-06 DIAGNOSIS — I429 Cardiomyopathy, unspecified: Secondary | ICD-10-CM

## 2015-05-06 DIAGNOSIS — R7989 Other specified abnormal findings of blood chemistry: Secondary | ICD-10-CM

## 2015-05-06 LAB — GLUCOSE, CAPILLARY
GLUCOSE-CAPILLARY: 181 mg/dL — AB (ref 65–99)
GLUCOSE-CAPILLARY: 156 mg/dL — AB (ref 65–99)
Glucose-Capillary: 129 mg/dL — ABNORMAL HIGH (ref 65–99)
Glucose-Capillary: 108 mg/dL — ABNORMAL HIGH (ref 65–99)

## 2015-05-06 MED ORDER — ATORVASTATIN CALCIUM 80 MG PO TABS
80.0000 mg | ORAL_TABLET | Freq: Every day | ORAL | Status: DC
  Administered 2015-05-06 – 2015-05-08 (×3): 80 mg via ORAL
  Filled 2015-05-06 (×3): qty 1

## 2015-05-06 NOTE — Progress Notes (Signed)
    SUBJECTIVE:  She denies any pain or SOB.     PHYSICAL EXAM Filed Vitals:   05/05/15 2152 05/06/15 0143 05/06/15 0652 05/06/15 1008  BP: 109/60 120/63 127/68 148/73  Pulse: 68 78 75 72  Temp: 98.8 F (37.1 C) 99.8 F (37.7 C) 99.8 F (37.7 C) 98.4 F (36.9 C)  TempSrc: Oral Oral Oral Oral  Resp: 20 20 20 20   Height:      Weight:      SpO2: 95% 96% 98% 97%   General:  No distress Lungs:  Clear Heart:  RRR Abdomen:  Positive bowel sounds, no rebound no guarding Extremities:  No edema   LABS: Lab Results  Component Value Date   TROPONINI 0.78* 05/05/2015   Results for orders placed or performed during the hospital encounter of 05/04/15 (from the past 24 hour(s))  Troponin I (q 6hr x 3)     Status: Abnormal   Collection Time: 05/05/15  3:30 PM  Result Value Ref Range   Troponin I 0.81 (HH) <0.031 ng/mL  Glucose, capillary     Status: Abnormal   Collection Time: 05/05/15  4:18 PM  Result Value Ref Range   Glucose-Capillary 115 (H) 65 - 99 mg/dL   Comment 1 Notify RN    Comment 2 Document in Chart   Troponin I (q 6hr x 3)     Status: Abnormal   Collection Time: 05/05/15  7:40 PM  Result Value Ref Range   Troponin I 0.78 (HH) <0.031 ng/mL  Glucose, capillary     Status: Abnormal   Collection Time: 05/05/15  9:54 PM  Result Value Ref Range   Glucose-Capillary 108 (H) 65 - 99 mg/dL   Comment 1 Notify RN    Comment 2 Document in Chart   Glucose, capillary     Status: Abnormal   Collection Time: 05/06/15  6:55 AM  Result Value Ref Range   Glucose-Capillary 129 (H) 65 - 99 mg/dL   Comment 1 Notify RN    Comment 2 Document in Chart   Glucose, capillary     Status: Abnormal   Collection Time: 05/06/15 11:26 AM  Result Value Ref Range   Glucose-Capillary 181 (H) 65 - 99 mg/dL   Comment 1 Notify RN    Comment 2 Document in Chart     Intake/Output Summary (Last 24 hours) at 05/06/15 1143 Last data filed at 05/06/15 1008  Gross per 24 hour  Intake    480 ml    Output      0 ml  Net    480 ml      ASSESSMENT AND PLAN:  CVA:  Plan TEE on Monday.  Message left with staff to schedule.  Discussed with patient and family   ELEVATED TROPONIN:  Work up as below.  CARDIOMYOPATHY:  Plan to defer invasive work up.  We will follow up with cath most likely as an outpatient.   BP has been labile.  Add ACE inhibitor if BP allows before discharge.     Jeneen Rinks [facility] 05/06/2015 11:43 AM

## 2015-05-06 NOTE — Progress Notes (Signed)
[facility] PROGRESS NOTE  Meeghan Skipper OVF:[id number] DOB: 11-Aug-1933 DOA: 05/04/2015 PCP: Kandice Hams, MD  Assessment/Plan:  Principal Problem:   Acute ischemic stroke: for TEE tomorrow.  Carotids with 40-59% stenosis bilaterally. Antegrade vertebrals. PT rec CIR. Will order consult Active Problems:   Diabetes mellitus   PMR (polymyalgia rheumatica)   Breast cancer   Altered mental status   Vertigo   Encephalopathy acute   CVA (cerebral vascular accident)   Elevated troponin elevated troponin: outpatient cath and repeat echo Cardiomyopathy, possibly takutsubo. Add ace eventually   Code Status:  DNR Family Communication:  at bedside Disposition Plan:  ?CIR  HPI/Subjective: No new complaints  Objective: Filed Vitals:   05/06/15 1008  BP: 148/73  Pulse: 72  Temp: 98.4 F (36.9 C)  Resp: 20    Intake/Output Summary (Last 24 hours) at 05/06/15 1232 Last data filed at 05/06/15 1008  Gross per 24 hour  Intake    480 ml  Output      0 ml  Net    480 ml   Filed Weights   05/04/15 2047  Weight: 77.2 kg (170 lb 3.1 oz)    Exam:   General:  Asleep arousable appropriate  Cardiovascular: RRR without MGR  Respiratory: CTA without WRR  Abdomen: S, NT, ND  Ext: no CCE  Neuro: speech less slurred. Right hand slightly week. Leg strength ok  Basic Metabolic Panel:  Recent Labs Lab 05/04/15 1344 05/04/15 1407 05/05/15 1007  NA 140 141 138  K 4.5 4.6 4.2  CL 105 107 104  CO2  --  23 23  GLUCOSE 169* 169* 118*  BUN 20 14 15   CREATININE 0.70 0.83 0.86  CALCIUM  --  9.9 8.8*   Liver Function Tests:  Recent Labs Lab 05/04/15 1407  AST 26  ALT 14  ALKPHOS 71  BILITOT 0.6  PROT 8.1  ALBUMIN 3.9   No results for input(s): LIPASE, AMYLASE in the last 168 hours. No results for input(s): AMMONIA in the last 168 hours. CBC:  Recent Labs Lab 05/04/15 1344 05/04/15 1407 05/05/15 1007  WBC  --  12.5* 10.8*  NEUTROABS  --  10.3*  --    HGB 14.3 13.4 12.8  HCT 42.0 40.0 37.6  MCV  --  84.7 84.1  PLT  --  270 276   Cardiac Enzymes:  Recent Labs Lab 05/05/15 0224 05/05/15 0825 05/05/15 1007 05/05/15 1530 05/05/15 1940  TROPONINI 1.46* 0.84* 0.99* 0.81* 0.78*   BNP (last 3 results) No results for input(s): BNP in the last 8760 hours.  ProBNP (last 3 results) No results for input(s): PROBNP in the last 8760 hours.  CBG:  Recent Labs Lab 05/05/15 1126 05/05/15 1618 05/05/15 2154 05/06/15 0655 05/06/15 1126  GLUCAP 120* 115* 108* 129* 181*    No results found for this or any previous visit (from the past 240 hour(s)).   Studies: Dg Chest 2 View  05/04/2015   CLINICAL DATA:  Shortness of breath and cough  EXAM: CHEST - 2 VIEW  COMPARISON:  04/09/2012  FINDINGS: Cardiac shadow is mildly enlarged. Aortic atherosclerotic change is again seen. Postsurgical changes left shoulder are noted. The lungs are well aerated bilaterally without focal infiltrate. Degenerative changes of the thoracic spine are noted.  IMPRESSION: No acute abnormality noted.   Electronically Signed   By: Inez Catalina M.D.   On: 05/04/2015 19:31   Ct Head Wo Contrast  05/06/2015   CLINICAL DATA:  Follow-up right cerebellar  stroke.  EXAM: CT HEAD WITHOUT CONTRAST  TECHNIQUE: Contiguous axial images were obtained from the base of the skull through the vertex without intravenous contrast.  COMPARISON:  Brain MRI and CT 05/04/2015  FINDINGS: There is increasing cytotoxic edema throughout the right cerebellum in the SCA territory corresponding to the acute infarct described on yesterday's MRI. There is no associated parenchymal hemorrhage. There is increased mass effect on the superior portion of the fourth ventricle which is now partially effaced. Edema involves the right superior and middle cerebellar peduncle is with mild mass effect on the posterior pons. Lateral and third ventricles are unchanged in size without evidence of hydrocephalus.  There  is no evidence of acute supratentorial large territory infarct, mass, midline shift, or extra-axial fluid collection. There is mild generalized cerebral atrophy. Hypodensities in the deep cerebral white matter are again noted and compatible with moderate chronic small vessel ischemic disease. A chronic left basal ganglia lacunar infarct is noted.  Prior bilateral cataract extraction is noted. The visualized paranasal sinuses and mastoid air cells are clear. Calcified atherosclerosis is noted at the skullbase.  IMPRESSION: Evolving, large right SCA territory cerebellar infarct with increasing cytotoxic edema and partial effacement of the fourth ventricle. No hydrocephalus.   Electronically Signed   By: Logan Bores   On: 05/06/2015 07:58   Ct Head Wo Contrast  05/04/2015   CLINICAL DATA:  81YOF arrives from [facility] via [place]. Pt found around 0800 this morning unable to carry out her normal activities and acting confused with slurred speech. Pt has c/o dizziness, nausea and diaphoresis. Pt has pinpoint pupils and upon arrival to ED has slurred/incomprehensible speech and is aroused by speech.  EXAM: CT HEAD WITHOUT CONTRAST  TECHNIQUE: Contiguous axial images were obtained from the base of the skull through the vertex without intravenous contrast.  COMPARISON:  Brain MRI, 06/06/2014.  Head CT, 04/24/2011.  FINDINGS: Ventricles are normal in configuration. There is ventricular and sulcal enlargement reflecting mild atrophy. There is a focus of hypoattenuation along the anterior left basal ganglia and anterior limb of the left internal capsule consistent with old lacune infarct, stable from the prior brain MRI. Other smaller foci of hypoattenuation in the base a ganglia suggests additional small lacune infarcts. There are no parenchymal masses or mass effect. There is no evidence of a recent cortical infarct. Patchy white matter hypoattenuation is noted consistent with moderate chronic microvascular ischemic  change.  There are no extra-axial masses or abnormal fluid collections.  There is no intracranial hemorrhage.  Visualized sinuses and mastoid air cells are clear.  IMPRESSION: 1. No acute intracranial abnormality 2. Mild atrophy and moderate chronic microvascular ischemic change. 3. Small basal gangliar lacune infarcts.   Electronically Signed   By: Lajean Manes M.D.   On: 05/04/2015 13:54   Mr Brain Wo Contrast  05/04/2015   CLINICAL DATA:  Dysarthria. Decreased mental status. Dizziness, nausea, and diaphoresis.  EXAM: MRI HEAD WITHOUT CONTRAST  TECHNIQUE: Multiplanar, multiecho pulse sequences of the brain and surrounding structures were obtained without intravenous contrast.  COMPARISON:  Head CT 05/04/2015 and MRI 06/06/2014  FINDINGS: There is an acute right cerebellar infarct involving essentially the entirety of the SCA territory. The right superior cerebellar peduncle and a portion of the middle cerebellar peduncle are involved. There is associated cytotoxic edema without significant mass effect.  No intracranial hemorrhage, mass, midline shift, or extra-axial fluid collection is seen. Patchy and confluent T2 hyperintensities in the subcortical and deep cerebral white matter  are similar to the prior MRI and nonspecific but compatible with moderate chronic small vessel ischemic disease. Mild chronic ischemic changes are also noted in the pons. There is mild cerebral atrophy. Chronic basal ganglia lacunar infarcts are again noted.  Prior bilateral cataract extraction is noted. Paranasal sinuses and mastoid air cells are clear. Major intracranial vascular flow voids are preserved.  IMPRESSION: 1. Acute, large right SCA territory cerebellar infarct. 2. Moderate chronic small vessel ischemic disease.   Electronically Signed   By: Logan Bores   On: 05/04/2015 18:45   Mr Jodene Nam Head/brain Wo Cm  05/05/2015   CLINICAL DATA:  Initial evaluation for acute stroke.  EXAM: MRA HEAD WITHOUT CONTRAST  TECHNIQUE:  Angiographic images of the Circle of Willis were obtained using MRA technique without intravenous contrast.  COMPARISON:  Prior MRI from earlier the same day.  FINDINGS: ANTERIOR CIRCULATION:  Visualized distal cervical segments of the internal carotid arteries are patent with antegrade flow. Petrous, cavernous, and supra clinoid segments are well opacified bilaterally. Short segment mild stenosis within the supraclinoid left ICA (series 301, image 16). A1 segments patent. Right A1 segment is somewhat hypoplastic. Anterior communicating artery and anterior cerebral arteries well opacified.  M1 segments widely patent without occlusion. Short-segment mild stenosis within the mid -distal left M1 segment (series 303, image 13). MCA bifurcations within normal limits. Distal MCA branches opacified bilaterally and grossly symmetric.  POSTERIOR CIRCULATION:  Vertebral arteries are patent to the vertebrobasilar junction. Posterior inferior cerebellar arteries not well visualized on this exam. Basilar artery widely patent. Left superior cerebellar artery patent. There appears to be occlusion of the right superior cerebellar artery proximally. Left P1 and P2 segments widely patent. Fetal origin of the right PCA with widely patent right posterior communicating artery. Distal branch atheromatous irregularity within the posterior cerebral arteries bilaterally.  No aneurysm or vascular malformation.  IMPRESSION: 1. Occlusion of the right superior cerebellar artery proximally, consistent with previously identified acute right SCA territory infarct. 2. No other large or proximal arterial branch occlusion within the intracranial circulation. 3. Short segment mild stenosis within the mid- distal left M1 segment. 4. Short segment mild stenosis within the supraclinoid left ICA. 5. Distal branch atheromatous irregularity within the posterior cerebral arteries bilaterally. 6. Fetal origin of the right PCA.   Electronically Signed   By:  Jeannine Boga M.D.   On: 05/05/2015 00:40    Scheduled Meds: . aspirin  300 mg Rectal Daily   Or  . aspirin  325 mg Oral Daily  . carvedilol  3.125 mg Oral BID WC  . DULoxetine  30 mg Oral Daily  . enoxaparin (LOVENOX) injection  40 mg Subcutaneous Q24H  . insulin aspart  0-9 Units Subcutaneous TID WC  . insulin glargine  5 Units Subcutaneous QHS  . pantoprazole  40 mg Oral Daily  . simvastatin  40 mg Oral QHS   Continuous Infusions:    Time spent: 25 minutes  [facility] e at [email or web address], password [email or web address] 05/06/2015, 12:32 PM  LOS: 2 days

## 2015-05-06 NOTE — Evaluation (Addendum)
Occupational Therapy Evaluation Patient Details Name: Cathy Taylor MRN: [id number] DOB: 08-25-1933 Today's Date: 05/06/2015    History of Present Illness Patient is a 81 y.o. female admitted 05/04/15 with slurred speech, confusion, dizziness.  MRI showed large Rt SCA territory cerebellar infarct.     Clinical Impression   Pt admitted with above. Pt independent with ADLs, PTA. Feel pt will benefit from acute OT to increase independence and strength prior to d/c. Recommending CIR for rehab.     Follow Up Recommendations  CIR    Equipment Recommendations  Other (comment) (defer to next venue)    Recommendations for Other Services Rehab consult     Precautions / Restrictions Precautions Precautions: Fall Precaution Comments: dizziness Restrictions Weight Bearing Restrictions: No      Mobility Bed Mobility Overal bed mobility: Needs Assistance Bed Mobility: Supine to Sit;Sit to Supine     Supine to sit: Min assist Sit to supine: Min assist   General bed mobility comments: Cues given for technique for bed mobility. Assist to scoot to EOB. assist given to scoot HOB.  Transfers Overall transfer level: Needs assistance               General transfer comment: attempted to stand but was unable to do so with +1 assist from bed.    Balance    Assist for balance sitting EOB during functional task.                                        ADL Overall ADL's : Needs assistance/impaired     Grooming: Wash/dry face;Brushing hair;Sitting;Minimal assistance (simulated applying deodorant) Grooming Details (indicate cue type and reason): assist for balance sitting EOB         Upper Body Dressing : Minimal assistance;Sitting;Moderate assistance   Lower Body Dressing: Sitting/lateral leans;Maximal assistance;Total assistance     Toilet Transfer Details (indicate cue type and reason): could not stand upright with one person assist, but did attempt          Functional mobility during ADLs:  (unable) General ADL Comments: Pt sat EOB and was unable to don/doff socks. Positioned pt's Rt arm on pillow at end of session. Discussed d/c options.     Vision     Perception     Praxis      Pertinent Vitals/Pain Pain Assessment: Faces Faces Pain Scale: Hurts little more Pain Location: right shoulder Pain Intervention(s): Monitored during session;Repositioned     Hand Dominance Right   Extremity/Trunk Assessment Upper Extremity Assessment Upper Extremity Assessment: RUE deficits/detail RUE Deficits / Details: AROM shoulder flexion to around 90 degrees; weak grip strength; reports arthritis in right shoulder and she had pain in right shoulder, PTA. RUE Sensation: decreased light touch RUE Coordination: decreased fine motor;decreased gross motor   Lower Extremity Assessment Lower Extremity Assessment: Defer to PT evaluation       Communication Communication Communication: Expressive difficulties   Cognition Arousal/Alertness: Awake/alert Behavior During Therapy: WFL for tasks assessed/performed;Anxious Overall Cognitive Status: unsure of baseline; slow processing-stated year but able to correct herself                     General Comments       Exercises       Shoulder Instructions      Home Living Family/patient expects to be discharged to:: Private residence Living Arrangements: Spouse/significant other Available Help at  Discharge: Family Type of Home: House Home Access: [place]: One [place]: [address] - 2 wheels;Walker - 4 wheels;Cane - single point;Bedside commode;Wheelchair - manual      Lives With: Spouse (patient is primary caregiver for her spouse)    Prior Functioning/Environment Level of Independence: Independent with assistive device(s)        Comments: Reports she uses cane at times    OT Diagnosis: Generalized weakness;Other (comment)  (hemiparesis dominant side)   OT Problem List: Decreased strength;Pain;Impaired UE functional use;Decreased cognition;Decreased activity tolerance;Impaired balance (sitting and/or standing);Decreased coordination;Impaired sensation;Decreased knowledge of precautions;Decreased knowledge of use of DME or AE   OT Treatment/Interventions: Self-care/ADL training;DME and/or AE instruction;Neuromuscular education;Therapeutic exercise;Therapeutic activities;Balance training;Patient/family education;Cognitive remediation/compensation    OT Goals(Current goals can be found in the care plan section) Acute Rehab OT Goals Patient Stated Goal: not stated OT Goal Formulation: With patient/family Time For Goal Achievement: 05/13/15 Potential to Achieve Goals: Good ADL Goals Pt Will Perform Lower Body Dressing: with mod assist;sitting/lateral leans Pt Will Transfer to Toilet: with mod assist;stand pivot transfer;bedside commode Pt Will Perform Toileting - Clothing Manipulation and hygiene: with mod assist;sitting/lateral leans Additional ADL Goal #1: Pt will independently perform HEP for RUE to increase strength and coordination.  OT Frequency: Min 2X/week   Barriers to D/C:            Co-evaluation              End of Session Equipment Utilized During Treatment: Gait belt;Rolling walker  Activity Tolerance: Other (comment);Patient tolerated treatment well (pt was dizzy in session) Patient left: in bed;with call bell/phone within reach;with bed alarm set;with family/visitor present   Time: 8676-1950 OT Time Calculation (min): 21 min Charges:  OT General Charges $OT Visit: 1 Procedure OT Evaluation $Initial OT Evaluation Tier I: 1 Procedure G-CodesBenito Mccreedy OTR/L [phone] 05/06/2015, 2:38 PM

## 2015-05-06 NOTE — Progress Notes (Signed)
STROKE TEAM PROGRESS NOTE   HISTORY Cathy Taylor is an 81 y.o. female with a istory of hypertension, hyperlipidemia, diabetes mellitus, breast cancer and polymyalgia rheumatica, who arranged vertigo with nausea she was also noted to have slurred speech and confusion and was sent to the emergency room for evaluation from her Primary care physician's office. No focal weakness was noted. She did not experience diplopia. CT scan of her head showed no acute intracranial abnormality. She was noted to have pinpoint pupils on arrival in the emergency room. Urine drug screen is pending. Patient has not been on antiplatelets therapy. She has no history of stroke nor TIA. MRI of the brain showed a large acute right cerebellar ischemic infarction.  LSN: 05/03/2015 tPA Given: No: Beyond time window for treatment consideration mRankin:   SUBJECTIVE (INTERVAL HISTORY) Patient now reports difficulty with dizziness when she sits up or tries to stand.  She denies nausea    OBJECTIVE Temp:  [97.7 F (36.5 C)-99.8 F (37.7 C)] 98.6 F (37 C) (08/14 1447) Pulse Rate:  [67-78] 70 (08/14 1447) Cardiac Rhythm:  [-]  Resp:  [16-20] 16 (08/14 1447) BP: (109-148)/(60-73) 133/61 mmHg (08/14 1447) SpO2:  [92 %-98 %] 95 % (08/14 1447)   Recent Labs Lab 05/05/15 1126 05/05/15 1618 05/05/15 2154 05/06/15 0655 05/06/15 1126  GLUCAP 120* 115* 108* 129* 181*    Recent Labs Lab 05/04/15 1344 05/04/15 1407 05/05/15 1007  NA 140 141 138  K 4.5 4.6 4.2  CL 105 107 104  CO2  --  23 23  GLUCOSE 169* 169* 118*  BUN 20 14 15   CREATININE 0.70 0.83 0.86  CALCIUM  --  9.9 8.8*    Recent Labs Lab 05/04/15 1407  AST 26  ALT 14  ALKPHOS 71  BILITOT 0.6  PROT 8.1  ALBUMIN 3.9    Recent Labs Lab 05/04/15 1344 05/04/15 1407 05/05/15 1007  WBC  --  12.5* 10.8*  NEUTROABS  --  10.3*  --   HGB 14.3 13.4 12.8  HCT 42.0 40.0 37.6  MCV  --  84.7 84.1  PLT  --  270 276    Recent Labs Lab  05/05/15 0224 05/05/15 0825 05/05/15 1007 05/05/15 1530 05/05/15 1940  TROPONINI 1.46* 0.84* 0.99* 0.81* 0.78*    Recent Labs  05/04/15 1407  LABPROT 13.9  INR 1.05    Recent Labs  05/04/15 1535  COLORURINE YELLOW  LABSPEC 1.016  PHURINE 6.0  GLUCOSEU NEGATIVE  HGBUR NEGATIVE  BILIRUBINUR NEGATIVE  KETONESUR NEGATIVE  PROTEINUR 30*  UROBILINOGEN 1.0  NITRITE NEGATIVE  LEUKOCYTESUR NEGATIVE       Component Value Date/Time   CHOL 162 05/05/2015 0224   TRIG 147 05/05/2015 0224   HDL 45 05/05/2015 0224   CHOLHDL 3.6 05/05/2015 0224   VLDL 29 05/05/2015 0224   LDLCALC 88 05/05/2015 0224   No results found for: HGBA1C    Component Value Date/Time   LABOPIA NONE DETECTED 05/04/2015 1535   COCAINSCRNUR NONE DETECTED 05/04/2015 1535   LABBENZ NONE DETECTED 05/04/2015 1535   AMPHETMU NONE DETECTED 05/04/2015 1535   THCU NONE DETECTED 05/04/2015 1535   LABBARB NONE DETECTED 05/04/2015 1535     Recent Labs Lab 05/04/15 1339  ETH <5     IMAGING  Dg Chest 2 View 05/04/2015    No acute abnormality noted.     Ct Head Wo Contrast 05/04/2015    1. No acute intracranial abnormality  2. Mild atrophy and moderate chronic microvascular  ischemic change.  3. Small basal gangliar lacune infarcts.      Mr Brain Wo Contrast 05/04/2015    1. Acute, large right SCA territory cerebellar infarct.  2. Moderate chronic small vessel ischemic disease.    Mr Jodene Nam Head/brain Wo Cm 05/05/2015    1. Occlusion of the right superior cerebellar artery proximally, consistent with previously identified acute right SCA territory  infarct.  2. No other large or proximal arterial branch occlusion within the intracranial circulation.  3. Short segment mild stenosis within the mid- distal left M1 segment.  4. Short segment mild stenosis within the supraclinoid left ICA.  5. Distal branch atheromatous irregularity within the posterior cerebral arteries bilaterally.  6. Fetal origin of  the right PCA.      Ct Head Wo Contrast 05/06/2015 Evolving, large right SCA territory cerebellar infarct with increasing cytotoxic edema and partial effacement of the fourth ventricle. No hydrocephalus.  PHYSICAL EXAM  GENERAL: Pleasant in no acute distress. HEENT: Normal. ABDOMEN: soft EXTREMITIES: No edema   MENTAL STATUS: Alert and oriented. Language and cognition are generally intact. Judgment and insight normal. She does have a mild dysarthria.  CRANIAL NERVES: Pupils are equal, round and reactive to light and accomodation; extra ocular movements are full, there is no significant nystagmus; visual fields are full; upper and lower facial muscles are normal in strength and symmetric, there is no flattening of the nasolabial folds; tongue is midline; uvula is midline; shoulder elevation is normal.  MOTOR: Normal tone, bulk and strength - Except mild right deltoid weakness which appears to be chronic from shoulder issues; no pronator drift.  COORDINATION: dysmetria in the right upper extremity greater than the right lower extremity. Left upper extremity and left leg normal. No rest tremor; no intention tremor; no postural tremor; no bradykinesia.  GAIT: Normal.   ASSESSMENT [name] is a 81 y.o. female with history of polymyalgia rheumatica, hypertension, hyperlipidemia, and diabetes mellitus,  presenting with vertigo, nausea, slurred speech, and confusion. She did not receive IV t-PA due to late presentation. Mild effacement of the fourth ventricle from her infarct.  I do not expect the patient to have significant edema but a repeat head C  Stroke:  Non-dominant infarct secondary to Large vessel intracranial occlusive disease  Resultant  Dysarthria, Right-sided dysmetria and dizziness.  MRI  Acute, large right SCA territory cerebellar infarct.   MRA  Occlusion of the right superior cerebellar artery proximally, consistent with previously identified acute right SCA  territory infarct.   Carotid Doppler Bilateral: 40% to 59% ICA stenosis however acoustic shodoing at the bifurcations may obscure higher velocities. Vertebral artery flow is antegrade  LDL 88  2D Echo EF 30-35%. No cardiac source of emboli identified.  HgbA1c pending  Lovenox for VTE prophylaxis  aspirin 81 mg orally every day prior to admission, now on aspirin 325 mg orally every day  Patient counseled to be compliant with her antithrombotic medications  Ongoing aggressive stroke risk factor management  Therapy recommendations: CIR recommended - consult rehabilitation M.D. (ordered)  Disposition:  Pending  Hypertension  Home meds: Benazepril hydrochlorothiazide diltiazem  Stable  Patient counseled to be compliant with her blood pressure medications  Hyperlipidemia  Home meds:  Zocor 40 mg daily resumed in hospital  LDL 88, goal < 70  Changed Zocor to Lipitor 80 mg daily  Continue statin at discharge  Diabetes  HgbA1c pending, goal < 7.0  Controlled  Other Stroke Risk Factors  Advanced age  Obesity, Body  mass index is 30.16 kg/(m^2).   Family hx stroke (son)   Other Active Problems  Leukocytosis  Elevated cardiac enzymes - cardiology has been consulted at and is following.  Left ventricular dysfunction. EF - 30-35%.  Ct Head Wo Contrast 05/06/2015 - increasing cytotoxic edema and partial effacement of the fourth  ventricle. No hydrocephalus.  PLAN  Changed Zocor to [facility] day # 2  Mikey Bussing PA-C [facility] Pager [phone] 05/06/2015, 2:53 PM   NEUROLOGY ATTENDING NOTE Patient was seen and examined by me personally. I reviewed notes, independently viewed imaging studies, participated in medical decision making and plan of care. I have made additions or clarifications directly to the above note.  Documentation accurately reflects findings. The laboratory and radiographic studies were personally reviewed  by me.  ROS completed by me personally and pertinent positives fully documented. OR  pertinent positives could not be fully documented due to LOC  Assessment fully documented above.  Plan:  Discussed the CT with daughter at the bedside.  Explained that patient has some atrophy and hopefully yhr swelling we see (expected) will no lead to additional neurologic changes; she understood the possibility and the importance of continued monitoring  Condition is worsened radiographically, but clinically stable   Recommend maximal statin therapy  SIGNED BY: Dr. Elissa Hefty        To contact Stroke Continuity provider, please refer to [email or web address] After hours, contact General Neurology

## 2015-05-07 ENCOUNTER — Inpatient Hospital Stay (HOSPITAL_COMMUNITY): Payer: Medicare Other

## 2015-05-07 ENCOUNTER — Encounter (HOSPITAL_COMMUNITY)
Admission: EM | Disposition: A | Discharge status: Skilled Nursing Facility | Payer: Self-pay | Source: Home / Self Care | Attending: Internal Medicine

## 2015-05-07 ENCOUNTER — Encounter (HOSPITAL_COMMUNITY): Payer: Self-pay

## 2015-05-07 DIAGNOSIS — R42 Dizziness and giddiness: Secondary | ICD-10-CM

## 2015-05-07 DIAGNOSIS — I639 Cerebral infarction, unspecified: Secondary | ICD-10-CM

## 2015-05-07 DIAGNOSIS — I429 Cardiomyopathy, unspecified: Secondary | ICD-10-CM

## 2015-05-07 DIAGNOSIS — R4781 Slurred speech: Secondary | ICD-10-CM

## 2015-05-07 DIAGNOSIS — E1159 Type 2 diabetes mellitus with other circulatory complications: Secondary | ICD-10-CM

## 2015-05-07 DIAGNOSIS — I6350 Cerebral infarction due to unspecified occlusion or stenosis of unspecified cerebral artery: Secondary | ICD-10-CM

## 2015-05-07 HISTORY — PX: TEE WITHOUT CARDIOVERSION: SHX5443

## 2015-05-07 LAB — HEMOGLOBIN A1C
Hgb A1c MFr Bld: 6.6 % — ABNORMAL HIGH (ref 4.8–5.6)
MEAN PLASMA GLUCOSE: 143 mg/dL

## 2015-05-07 LAB — GLUCOSE, CAPILLARY
GLUCOSE-CAPILLARY: 112 mg/dL — AB (ref 65–99)
GLUCOSE-CAPILLARY: 122 mg/dL — AB (ref 65–99)
Glucose-Capillary: 129 mg/dL — ABNORMAL HIGH (ref 65–99)
Glucose-Capillary: 102 mg/dL — ABNORMAL HIGH (ref 65–99)

## 2015-05-07 SURGERY — ECHOCARDIOGRAM, TRANSESOPHAGEAL
Anesthesia: Moderate Sedation

## 2015-05-07 MED ORDER — CARVEDILOL 6.25 MG PO TABS
6.2500 mg | ORAL_TABLET | Freq: Two times a day (BID) | ORAL | Status: DC
  Administered 2015-05-07 – 2015-05-09 (×4): 6.25 mg via ORAL
  Filled 2015-05-07 (×4): qty 1

## 2015-05-07 MED ORDER — MIDAZOLAM HCL 5 MG/ML IJ SOLN
INTRAMUSCULAR | Status: AC
  Filled 2015-05-07: qty 2

## 2015-05-07 MED ORDER — FENTANYL CITRATE (PF) 100 MCG/2ML IJ SOLN
INTRAMUSCULAR | Status: AC
  Filled 2015-05-07: qty 2

## 2015-05-07 MED ORDER — BUTAMBEN-TETRACAINE-BENZOCAINE 2-2-14 % EX AERO
INHALATION_SPRAY | CUTANEOUS | Status: DC | PRN
  Administered 2015-05-07: 2 via TOPICAL

## 2015-05-07 MED ORDER — MIDAZOLAM HCL 10 MG/2ML IJ SOLN
INTRAMUSCULAR | Status: DC | PRN
  Administered 2015-05-07: 2 mg via INTRAVENOUS

## 2015-05-07 MED ORDER — IOHEXOL 350 MG/ML SOLN
60.0000 mL | Freq: Once | INTRAVENOUS | Status: AC | PRN
Start: 2015-05-07 — End: 2015-05-07
  Administered 2015-05-07: 60 mL via INTRAVENOUS

## 2015-05-07 MED ORDER — LISINOPRIL 2.5 MG PO TABS
2.5000 mg | ORAL_TABLET | Freq: Every day | ORAL | Status: DC
  Administered 2015-05-08 – 2015-05-09 (×2): 2.5 mg via ORAL
  Filled 2015-05-07 (×2): qty 1

## 2015-05-07 MED ORDER — FENTANYL CITRATE (PF) 100 MCG/2ML IJ SOLN
INTRAMUSCULAR | Status: DC | PRN
  Administered 2015-05-07: 25 ug via INTRAVENOUS

## 2015-05-07 NOTE — Progress Notes (Signed)
Pt returned from TEE procedure stable, but drowsy. Unable to administer medication because she cant stay awake. No noted distress. She denies pain at this time. Daughters at bedside. Safety measures in place. Call bell within reach.

## 2015-05-07 NOTE — H&P (View-Only) (Signed)
[facility] PROGRESS NOTE  Mylan Lengyel WUJ:[id number] DOB: 07/31/1933 DOA: 05/04/2015 PCP: Kandice Hams, MD  Assessment/Plan:  Principal Problem:   Acute ischemic stroke: for TEE tomorrow.  Carotids with 40-59% stenosis bilaterally. Antegrade vertebrals. PT rec CIR. Will order consult Active Problems:   Diabetes mellitus   PMR (polymyalgia rheumatica)   Breast cancer   Altered mental status   Vertigo   Encephalopathy acute   CVA (cerebral vascular accident)   Elevated troponin elevated troponin: outpatient cath and repeat echo Cardiomyopathy, possibly takutsubo. Add ace eventually   Code Status:  DNR Family Communication:  at bedside Disposition Plan:  ?CIR  HPI/Subjective: No new complaints  Objective: Filed Vitals:   05/06/15 1008  BP: 148/73  Pulse: 72  Temp: 98.4 F (36.9 C)  Resp: 20    Intake/Output Summary (Last 24 hours) at 05/06/15 1232 Last data filed at 05/06/15 1008  Gross per 24 hour  Intake    480 ml  Output      0 ml  Net    480 ml   Filed Weights   05/04/15 2047  Weight: 77.2 kg (170 lb 3.1 oz)    Exam:   General:  Asleep arousable appropriate  Cardiovascular: RRR without MGR  Respiratory: CTA without WRR  Abdomen: S, NT, ND  Ext: no CCE  Neuro: speech less slurred. Right hand slightly week. Leg strength ok  Basic Metabolic Panel:  Recent Labs Lab 05/04/15 1344 05/04/15 1407 05/05/15 1007  NA 140 141 138  K 4.5 4.6 4.2  CL 105 107 104  CO2  --  23 23  GLUCOSE 169* 169* 118*  BUN 20 14 15   CREATININE 0.70 0.83 0.86  CALCIUM  --  9.9 8.8*   Liver Function Tests:  Recent Labs Lab 05/04/15 1407  AST 26  ALT 14  ALKPHOS 71  BILITOT 0.6  PROT 8.1  ALBUMIN 3.9   No results for input(s): LIPASE, AMYLASE in the last 168 hours. No results for input(s): AMMONIA in the last 168 hours. CBC:  Recent Labs Lab 05/04/15 1344 05/04/15 1407 05/05/15 1007  WBC  --  12.5* 10.8*  NEUTROABS  --  10.3*  --    HGB 14.3 13.4 12.8  HCT 42.0 40.0 37.6  MCV  --  84.7 84.1  PLT  --  270 276   Cardiac Enzymes:  Recent Labs Lab 05/05/15 0224 05/05/15 0825 05/05/15 1007 05/05/15 1530 05/05/15 1940  TROPONINI 1.46* 0.84* 0.99* 0.81* 0.78*   BNP (last 3 results) No results for input(s): BNP in the last 8760 hours.  ProBNP (last 3 results) No results for input(s): PROBNP in the last 8760 hours.  CBG:  Recent Labs Lab 05/05/15 1126 05/05/15 1618 05/05/15 2154 05/06/15 0655 05/06/15 1126  GLUCAP 120* 115* 108* 129* 181*    No results found for this or any previous visit (from the past 240 hour(s)).   Studies: Dg Chest 2 View  05/04/2015   CLINICAL DATA:  Shortness of breath and cough  EXAM: CHEST - 2 VIEW  COMPARISON:  04/09/2012  FINDINGS: Cardiac shadow is mildly enlarged. Aortic atherosclerotic change is again seen. Postsurgical changes left shoulder are noted. The lungs are well aerated bilaterally without focal infiltrate. Degenerative changes of the thoracic spine are noted.  IMPRESSION: No acute abnormality noted.   Electronically Signed   By: Inez Catalina M.D.   On: 05/04/2015 19:31   Ct Head Wo Contrast  05/06/2015   CLINICAL DATA:  Follow-up right cerebellar  stroke.  EXAM: CT HEAD WITHOUT CONTRAST  TECHNIQUE: Contiguous axial images were obtained from the base of the skull through the vertex without intravenous contrast.  COMPARISON:  Brain MRI and CT 05/04/2015  FINDINGS: There is increasing cytotoxic edema throughout the right cerebellum in the SCA territory corresponding to the acute infarct described on yesterday's MRI. There is no associated parenchymal hemorrhage. There is increased mass effect on the superior portion of the fourth ventricle which is now partially effaced. Edema involves the right superior and middle cerebellar peduncle is with mild mass effect on the posterior pons. Lateral and third ventricles are unchanged in size without evidence of hydrocephalus.  There  is no evidence of acute supratentorial large territory infarct, mass, midline shift, or extra-axial fluid collection. There is mild generalized cerebral atrophy. Hypodensities in the deep cerebral white matter are again noted and compatible with moderate chronic small vessel ischemic disease. A chronic left basal ganglia lacunar infarct is noted.  Prior bilateral cataract extraction is noted. The visualized paranasal sinuses and mastoid air cells are clear. Calcified atherosclerosis is noted at the skullbase.  IMPRESSION: Evolving, large right SCA territory cerebellar infarct with increasing cytotoxic edema and partial effacement of the fourth ventricle. No hydrocephalus.   Electronically Signed   By: Logan Bores   On: 05/06/2015 07:58   Ct Head Wo Contrast  05/04/2015   CLINICAL DATA:  81YOF arrives from [facility] via [place]. Pt found around 0800 this morning unable to carry out her normal activities and acting confused with slurred speech. Pt has c/o dizziness, nausea and diaphoresis. Pt has pinpoint pupils and upon arrival to ED has slurred/incomprehensible speech and is aroused by speech.  EXAM: CT HEAD WITHOUT CONTRAST  TECHNIQUE: Contiguous axial images were obtained from the base of the skull through the vertex without intravenous contrast.  COMPARISON:  Brain MRI, 06/06/2014.  Head CT, 04/24/2011.  FINDINGS: Ventricles are normal in configuration. There is ventricular and sulcal enlargement reflecting mild atrophy. There is a focus of hypoattenuation along the anterior left basal ganglia and anterior limb of the left internal capsule consistent with old lacune infarct, stable from the prior brain MRI. Other smaller foci of hypoattenuation in the base a ganglia suggests additional small lacune infarcts. There are no parenchymal masses or mass effect. There is no evidence of a recent cortical infarct. Patchy white matter hypoattenuation is noted consistent with moderate chronic microvascular ischemic  change.  There are no extra-axial masses or abnormal fluid collections.  There is no intracranial hemorrhage.  Visualized sinuses and mastoid air cells are clear.  IMPRESSION: 1. No acute intracranial abnormality 2. Mild atrophy and moderate chronic microvascular ischemic change. 3. Small basal gangliar lacune infarcts.   Electronically Signed   By: Lajean Manes M.D.   On: 05/04/2015 13:54   Mr Brain Wo Contrast  05/04/2015   CLINICAL DATA:  Dysarthria. Decreased mental status. Dizziness, nausea, and diaphoresis.  EXAM: MRI HEAD WITHOUT CONTRAST  TECHNIQUE: Multiplanar, multiecho pulse sequences of the brain and surrounding structures were obtained without intravenous contrast.  COMPARISON:  Head CT 05/04/2015 and MRI 06/06/2014  FINDINGS: There is an acute right cerebellar infarct involving essentially the entirety of the SCA territory. The right superior cerebellar peduncle and a portion of the middle cerebellar peduncle are involved. There is associated cytotoxic edema without significant mass effect.  No intracranial hemorrhage, mass, midline shift, or extra-axial fluid collection is seen. Patchy and confluent T2 hyperintensities in the subcortical and deep cerebral white matter  are similar to the prior MRI and nonspecific but compatible with moderate chronic small vessel ischemic disease. Mild chronic ischemic changes are also noted in the pons. There is mild cerebral atrophy. Chronic basal ganglia lacunar infarcts are again noted.  Prior bilateral cataract extraction is noted. Paranasal sinuses and mastoid air cells are clear. Major intracranial vascular flow voids are preserved.  IMPRESSION: 1. Acute, large right SCA territory cerebellar infarct. 2. Moderate chronic small vessel ischemic disease.   Electronically Signed   By: Logan Bores   On: 05/04/2015 18:45   Mr Jodene Nam Head/brain Wo Cm  05/05/2015   CLINICAL DATA:  Initial evaluation for acute stroke.  EXAM: MRA HEAD WITHOUT CONTRAST  TECHNIQUE:  Angiographic images of the Circle of Willis were obtained using MRA technique without intravenous contrast.  COMPARISON:  Prior MRI from earlier the same day.  FINDINGS: ANTERIOR CIRCULATION:  Visualized distal cervical segments of the internal carotid arteries are patent with antegrade flow. Petrous, cavernous, and supra clinoid segments are well opacified bilaterally. Short segment mild stenosis within the supraclinoid left ICA (series 301, image 16). A1 segments patent. Right A1 segment is somewhat hypoplastic. Anterior communicating artery and anterior cerebral arteries well opacified.  M1 segments widely patent without occlusion. Short-segment mild stenosis within the mid -distal left M1 segment (series 303, image 13). MCA bifurcations within normal limits. Distal MCA branches opacified bilaterally and grossly symmetric.  POSTERIOR CIRCULATION:  Vertebral arteries are patent to the vertebrobasilar junction. Posterior inferior cerebellar arteries not well visualized on this exam. Basilar artery widely patent. Left superior cerebellar artery patent. There appears to be occlusion of the right superior cerebellar artery proximally. Left P1 and P2 segments widely patent. Fetal origin of the right PCA with widely patent right posterior communicating artery. Distal branch atheromatous irregularity within the posterior cerebral arteries bilaterally.  No aneurysm or vascular malformation.  IMPRESSION: 1. Occlusion of the right superior cerebellar artery proximally, consistent with previously identified acute right SCA territory infarct. 2. No other large or proximal arterial branch occlusion within the intracranial circulation. 3. Short segment mild stenosis within the mid- distal left M1 segment. 4. Short segment mild stenosis within the supraclinoid left ICA. 5. Distal branch atheromatous irregularity within the posterior cerebral arteries bilaterally. 6. Fetal origin of the right PCA.   Electronically Signed   By:  Jeannine Boga M.D.   On: 05/05/2015 00:40    Scheduled Meds: . aspirin  300 mg Rectal Daily   Or  . aspirin  325 mg Oral Daily  . carvedilol  3.125 mg Oral BID WC  . DULoxetine  30 mg Oral Daily  . enoxaparin (LOVENOX) injection  40 mg Subcutaneous Q24H  . insulin aspart  0-9 Units Subcutaneous TID WC  . insulin glargine  5 Units Subcutaneous QHS  . pantoprazole  40 mg Oral Daily  . simvastatin  40 mg Oral QHS   Continuous Infusions:    Time spent: 25 minutes  [place] [facility] e at [email or web address], password [facility] 05/06/2015, 12:32 PM  LOS: 2 days

## 2015-05-07 NOTE — CV Procedure (Signed)
Procedure: TEE  Indication: CVA  Sedation: Versed 2 mg IV, Fentanyl 25 mcg IV  Findings: Please see echo section for full report.  Normal LV size with mild focal basal septal hypertrophy.  EF 30% with akinesis of the mid to apical anteroseptal and anterior walls and the true apex.  No LV thrombus was visualized.  Normal RV size and systolic function.  There was a prominent RV papillary muscle. Trivial MR.  Trivial TR.  Trileaflet aortic valve with no AI and no AS.  Mild left atrial enlargement, normal right atrial size.  No LAA thrombus.  No PFO/ASD (negative bubble study). Lipomatous atrial septal hypertrophy.  There was grade III plaque in the descending thoracic aorta.   Suspect ischemic cardiomyopathy.  No source of embolus noted.   Cathy Taylor 05/07/2015 9:25 AM

## 2015-05-07 NOTE — Care Management Important Message (Signed)
Important Message  Patient Details  Name: Cathy Taylor MRN: [id number] Date of Birth: 06-30-1933   Medicare Important Message Given:  Yes-second notification given    Delorse Lek 05/07/2015, 3:50 PM

## 2015-05-07 NOTE — Progress Notes (Signed)
Physical Therapy Treatment Patient Details Name: Cathy Taylor MRN: [id number] DOB: Aug 07, 1933 Today's Date: 05/07/2015    History of Present Illness Patient is a 81 y.o. female admitted 05/04/15 with slurred speech, confusion, dizziness.  MRI showed large Rt SCA territory cerebellar infarct.      PT Comments    PT treatment greatly limited today by lethargy. Pt did follow all commands and attempted each task but required maxA for all mobility. OOB mobility progress limited by IV team come to prepare pt for loop recorder placement. Con't to recommend CIR upon d/c.   Follow Up Recommendations  CIR;Supervision/Assistance - 24 hour     Equipment Recommendations  None recommended by PT    Recommendations for Other Services       Precautions / Restrictions Precautions Precautions: Fall Restrictions Weight Bearing Restrictions: No    Mobility  Bed Mobility Overal bed mobility: Needs Assistance Bed Mobility: Supine to Sit;Sit to Supine     Supine to sit: Max assist Sit to supine: Max assist   General bed mobility comments: pt initiated transfers however extremely lethargic requiring maxA for trunk elevation and assist to elevated LEs back into bedc  Transfers                 General transfer comment: unable due to lethargy and IV team coming   Ambulation/Gait                 Stairs            Wheelchair Mobility    Modified Rankin (Stroke Patients Only) Modified Rankin (Stroke Patients Only) Pre-Morbid Rankin Score: No symptoms Modified Rankin: Severe disability     Balance Overall balance assessment: Needs assistance Sitting-balance support: Feet supported;Bilateral upper extremity supported Sitting balance-Leahy Scale: Poor Sitting balance - Comments: due to lethargy pt required modA to maintain sitting EOB balance, pt with flexed trunk                            Cognition Arousal/Alertness: Awake/alert Behavior During Therapy:  WFL for tasks assessed/performed Overall Cognitive Status: Difficult to assess                      Exercises      General Comments        Pertinent Vitals/Pain Pain Assessment:  (did not report)    Home Living                      Prior Function            PT Goals (current goals can now be found in the care plan section) Acute Rehab PT Goals Patient Stated Goal: not state Progress towards PT goals:  (limited today by lethargy)    Frequency  Min 4X/week    PT Plan Current plan remains appropriate    Co-evaluation             End of Session           Time: 6378-5885 PT Time Calculation (min) (ACUTE ONLY): 14 min  Charges:  $Therapeutic Activity: 8-22 mins                    G Codes:      Kingsley Callander 05/07/2015, 4:09 PM   Kittie Plater, PT, DPT Pager #: [phone] Office #: [phone]

## 2015-05-07 NOTE — Progress Notes (Signed)
[facility] PROGRESS NOTE  Jolicia Delira CZY:[id number] DOB: 01-31-1933 DOA: 05/04/2015 PCP: Kandice Hams, MD  Assessment/Plan:  Principal Problem:   Acute ischemic stroke: for TEE without source of embolus.  Carotids with 40-59% stenosis. Neuro ordered CTA neck. EP consulted for loop recorder. Eventual CIR Active Problems:   Diabetes mellitus   PMR (polymyalgia rheumatica)   Breast cancer   Altered mental status elevated troponin: outpatient cath and repeat echo Cardiomyopathy, on ACE, [place]   Code Status:  DNR Family Communication:  at bedside Disposition Plan:  CIR after workup complete  HPI/Subjective: No new complaints  Objective: Filed Vitals:   05/07/15 1428  BP: 127/64  Pulse: 63  Temp: 98.2 F (36.8 C)  Resp: 16   No intake or output data in the 24 hours ending 05/07/15 1535 Filed Weights   05/04/15 2047  Weight: 77.2 kg (170 lb 3.1 oz)    Exam:   General:  Asleep arousable appropriate  Cardiovascular: RRR without MGR  Respiratory: CTA without WRR  Abdomen: S, NT, ND  Ext: no CCE  Neuro: speech less slurred. Right hand slightly week. Leg strength ok  Basic Metabolic Panel:  Recent Labs Lab 05/04/15 1344 05/04/15 1407 05/05/15 1007  NA 140 141 138  K 4.5 4.6 4.2  CL 105 107 104  CO2  --  23 23  GLUCOSE 169* 169* 118*  BUN 20 14 15   CREATININE 0.70 0.83 0.86  CALCIUM  --  9.9 8.8*   Liver Function Tests:  Recent Labs Lab 05/04/15 1407  AST 26  ALT 14  ALKPHOS 71  BILITOT 0.6  PROT 8.1  ALBUMIN 3.9   No results for input(s): LIPASE, AMYLASE in the last 168 hours. No results for input(s): AMMONIA in the last 168 hours. CBC:  Recent Labs Lab 05/04/15 1344 05/04/15 1407 05/05/15 1007  WBC  --  12.5* 10.8*  NEUTROABS  --  10.3*  --   HGB 14.3 13.4 12.8  HCT 42.0 40.0 37.6  MCV  --  84.7 84.1  PLT  --  270 276   Cardiac Enzymes:  Recent Labs Lab 05/05/15 0224 05/05/15 0825 05/05/15 1007  05/05/15 1530 05/05/15 1940  TROPONINI 1.46* 0.84* 0.99* 0.81* 0.78*   BNP (last 3 results) No results for input(s): BNP in the last 8760 hours.  ProBNP (last 3 results) No results for input(s): PROBNP in the last 8760 hours.  CBG:  Recent Labs Lab 05/06/15 1126 05/06/15 1613 05/06/15 2110 05/07/15 0650 05/07/15 1133  GLUCAP 181* 108* 156* 129* 112*    No results found for this or any previous visit (from the past 240 hour(s)).   Studies: Ct Head Wo Contrast  05/06/2015   CLINICAL DATA:  Follow-up right cerebellar stroke.  EXAM: CT HEAD WITHOUT CONTRAST  TECHNIQUE: Contiguous axial images were obtained from the base of the skull through the vertex without intravenous contrast.  COMPARISON:  Brain MRI and CT 05/04/2015  FINDINGS: There is increasing cytotoxic edema throughout the right cerebellum in the SCA territory corresponding to the acute infarct described on yesterday's MRI. There is no associated parenchymal hemorrhage. There is increased mass effect on the superior portion of the fourth ventricle which is now partially effaced. Edema involves the right superior and middle cerebellar peduncle is with mild mass effect on the posterior pons. Lateral and third ventricles are unchanged in size without evidence of hydrocephalus.  There is no evidence of acute supratentorial large territory infarct, mass, midline shift, or extra-axial fluid collection.  There is mild generalized cerebral atrophy. Hypodensities in the deep cerebral white matter are again noted and compatible with moderate chronic small vessel ischemic disease. A chronic left basal ganglia lacunar infarct is noted.  Prior bilateral cataract extraction is noted. The visualized paranasal sinuses and mastoid air cells are clear. Calcified atherosclerosis is noted at the skullbase.  IMPRESSION: Evolving, large right SCA territory cerebellar infarct with increasing cytotoxic edema and partial effacement of the fourth ventricle. No  hydrocephalus.   Electronically Signed   By: Logan Bores   On: 05/06/2015 07:58    Scheduled Meds: . aspirin  300 mg Rectal Daily   Or  . aspirin  325 mg Oral Daily  . atorvastatin  80 mg Oral q1800  . carvedilol  3.125 mg Oral BID WC  . DULoxetine  30 mg Oral Daily  . enoxaparin (LOVENOX) injection  40 mg Subcutaneous Q24H  . insulin aspart  0-9 Units Subcutaneous TID WC  . insulin glargine  5 Units Subcutaneous QHS  . pantoprazole  40 mg Oral Daily   Continuous Infusions:    Time spent: 15 minutes  [place] [facility] e at [email or web address], password [facility] 05/07/2015, 3:35 PM  LOS: 3 days

## 2015-05-07 NOTE — Consult Note (Signed)
ELECTROPHYSIOLOGY CONSULT NOTE  Patient ID: Cathy Taylor MRN: [id number], DOB/AGE: [birth year]   Admit date: 05/04/2015 Date of Consult: 05/07/2015  Primary Physician: Kandice Hams, MD Primary Cardiologist: Dr Tamala Julian Reason for Consultation: Cryptogenic stroke; recommendations regarding Implantable Loop Recorder  History of Present Illness:  Cathy Taylor is a 81 y/o female admitted on 05/04/2015 with slurred speech and confusion.  Imaging demonstrated large Rt cerebral infarct.  She has undergone workup for stroke including transesophageal echocardiogram and carotid dopplers.  The patient has been monitored on telemetry which has demonstrated sinus rhythm with no arrhythmias.     Echocardiogram this admission demonstrated suspected ischemic cardiomyopathy with an EF of 30-35% with akinesis of the mid-apicalanteroseptal and apical myocardium.   Lab work is remarkable for slightly elevated Troponin of 1.46 pk. The plan is to defer ischemic work up till she had recovered more from her stroke.  EP has been asked to evaluate for placement of an implantable loop recorder to monitor for atrial fibrillation.  ROS is negative except as outlined above. She saw Dr Tamala Julian years ago and had what the pt describes as a negative stress test. No history of PAF, MI, or prior cath.    Past Medical History  Diagnosis Date  . Stress fracture of left femur with nonunion 07/08/2011  . PMR (polymyalgia rheumatica)   . GERD (gastroesophageal reflux disease)   . Anxiety   . Hypertension   . Abdominal hernia   . Hypercholesteremia   . Heart murmur   . Chronic back pain greater than 3 months duration   . Bronchiectasis 11/12  . 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 Depression   . Bronchitis     "that's what I'm here for"  . Type II diabetes mellitus   . Umbilical hernia 7/84/69    unrepaired  . Breast cancer 1973    right  . Arthritis     "everywhere  . Osteoarthritis of left shoulder 01/06/2012  .  Stroke      Surgical History:  Past Surgical History  Procedure Laterality Date  . Tubal ligation    . Cataract extraction w/ intraocular lens implant  07/2011    left  . Shoulder arthroscopy  06/2000    left  . Femur surgery  07/07/2011    left rod placed  . Total shoulder arthroplasty  01/06/12    left  . Breast biopsy  1981    left  . Mastectomy  1973    right  . Total shoulder arthroplasty  01/06/2012    Procedure: TOTAL SHOULDER ARTHROPLASTY;  Surgeon: Johnny Bridge, MD;  Location: [facility];  Service: Orthopedics;  Laterality: Left;     Prescriptions prior to admission  Medication Sig Dispense Refill Last Dose  . acetaminophen (TYLENOL) 500 MG tablet Take 500 mg by mouth every 6 (six) hours as needed. For pain    unk  . aspirin EC 81 MG tablet Take 81 mg by mouth daily.     05/02/15  . benazepril-hydrochlorthiazide (LOTENSIN HCT) 20-12.5 MG per tablet Take 1 tablet by mouth daily.     05/02/2015  . diclofenac sodium (VOLTAREN) 1 % GEL Apply 1 application topically 4 (four) times daily as needed. Pain in shoulder   unk  . diltiazem (CARDIZEM CD) 240 MG 24 hr capsule Take 1 tablet by mouth daily.   05/02/15  . DULoxetine (CYMBALTA) 30 MG capsule Take 30 mg by mouth daily.  05/03/2015 at Unknown time  . esomeprazole (NEXIUM) 20 MG capsule Take 20 mg by mouth 2 (two) times daily before a meal.   05/02/2015  . glimepiride (AMARYL) 1 MG tablet Take 1 mg by mouth daily.     05/02/2015 at Unknown time  . metFORMIN (GLUCOPHAGE) 500 MG tablet Take 1,000 mg by mouth 2 (two) times daily.    05/03/2015 at Unknown time  . simvastatin (ZOCOR) 40 MG tablet Take 40 mg by mouth at bedtime.     05/02/2015  . albuterol (PROVENTIL HFA;VENTOLIN HFA) 108 (90 BASE) MCG/ACT inhaler Inhale 2 puffs into the lungs every 6 (six) hours as needed. Shortness of breath   unk    Inpatient Medications:  . aspirin  300 mg Rectal Daily   Or  . aspirin  325 mg Oral Daily  . atorvastatin  80 mg Oral q1800  .  carvedilol  3.125 mg Oral BID WC  . DULoxetine  30 mg Oral Daily  . enoxaparin (LOVENOX) injection  40 mg Subcutaneous Q24H  . insulin aspart  0-9 Units Subcutaneous TID WC  . insulin glargine  5 Units Subcutaneous QHS  . pantoprazole  40 mg Oral Daily    Allergies: No Known Allergies  Social History   Social History  . Marital Status: Married    Spouse Name: N/A  . Number of Children: N/A  . Years of Education: N/A   Occupational History  . Not on file.   Social History Main Topics  . Smoking status: Never Smoker   . Smokeless tobacco: Former Systems developer    Types: Snuff    Quit date: 09/22/1948     Comment: "just tried smoking; didn't smoke to amount to nothing"  . Alcohol Use: No  . Drug Use: No  . Sexual Activity: Not Currently   Other Topics Concern  . Not on file   Social History Narrative   Currently lives with her husband who has Parkinson's Disease and Dementia. Their children live nearby. They have home health caregivers who assist them throughout the week as well.     Family History  Problem Relation Age of Onset  . Heart attack Father   . Stroke Son   . Stroke Maternal Aunt      Physical Exam: Filed Vitals:   05/07/15 0950 05/07/15 0955 05/07/15 1000 05/07/15 1428  BP: 153/68 151/72 144/78 127/64  Pulse: 73 73 70 63  Temp:    98.2 F (36.8 C)  TempSrc:    Oral  Resp: 13 12 15 16   Height:      Weight:      SpO2: 97% 97% 97% 96%    GEN- The patient is well appearing, alert and oriented x 3 today.  Speech is slurred but understandable.  Head- normocephalic, atraumatic Eyes-  Sclera clear, conjunctiva pink Ears- hearing intact Oropharynx- clear Neck- supple, Lungs- Clear to ausculation bilaterally, normal work of breathing Heart- Regular rate and rhythm, no murmurs, rubs or gallops, PMI not laterally displaced GI- soft, NT, ND, + BS Extremities- no clubbing, cyanosis, or edema MS- no significant deformity or atrophy Skin- no rash or lesion Psych-  euthymic mood, full affect   Labs:   Lab Results  Component Value Date   WBC 10.8* 05/05/2015   HGB 12.8 05/05/2015   HCT 37.6 05/05/2015   MCV 84.1 05/05/2015   PLT 276 05/05/2015     Recent Labs Lab 05/04/15 1407 05/05/15 1007  NA 141 138  K 4.6 4.2  CL 107 104  CO2 23 23  BUN 14 15  CREATININE 0.83 0.86  CALCIUM 9.9 8.8*  PROT 8.1  --   BILITOT 0.6  --   ALKPHOS 71  --   ALT 14  --   AST 26  --   GLUCOSE 169* 118*   Lab Results  Component Value Date   CKTOTAL 305* 01/07/2012   CKMB 5.5* 01/07/2012   TROPONINI 0.78* 05/05/2015   Lab Results  Component Value Date   CHOL 162 05/05/2015   Lab Results  Component Value Date   HDL 45 05/05/2015   Lab Results  Component Value Date   LDLCALC 88 05/05/2015   Lab Results  Component Value Date   TRIG 147 05/05/2015   Lab Results  Component Value Date   CHOLHDL 3.6 05/05/2015   No results found for: LDLDIRECT  Lab Results  Component Value Date   DDIMER 0.68* 09/10/2011     Radiology/Studies: Dg Chest 2 View  05/04/2015   CLINICAL DATA:  Shortness of breath and cough  EXAM: CHEST - 2 VIEW  COMPARISON:  04/09/2012  FINDINGS: Cardiac shadow is mildly enlarged. Aortic atherosclerotic change is again seen. Postsurgical changes left shoulder are noted. The lungs are well aerated bilaterally without focal infiltrate. Degenerative changes of the thoracic spine are noted.  IMPRESSION: No acute abnormality noted.   Electronically Signed   By: Inez Catalina M.D.   On: 05/04/2015 19:31   Ct Head Wo Contrast  05/06/2015   CLINICAL DATA:  Follow-up right cerebellar stroke.  EXAM: CT HEAD WITHOUT CONTRAST  TECHNIQUE: Contiguous axial images were obtained from the base of the skull through the vertex without intravenous contrast.  COMPARISON:  Brain MRI and CT 05/04/2015  FINDINGS: There is increasing cytotoxic edema throughout the right cerebellum in the SCA territory corresponding to the acute infarct described on  yesterday's MRI. There is no associated parenchymal hemorrhage. There is increased mass effect on the superior portion of the fourth ventricle which is now partially effaced. Edema involves the right superior and middle cerebellar peduncle is with mild mass effect on the posterior pons. Lateral and third ventricles are unchanged in size without evidence of hydrocephalus.  There is no evidence of acute supratentorial large territory infarct, mass, midline shift, or extra-axial fluid collection. There is mild generalized cerebral atrophy. Hypodensities in the deep cerebral white matter are again noted and compatible with moderate chronic small vessel ischemic disease. A chronic left basal ganglia lacunar infarct is noted.  Prior bilateral cataract extraction is noted. The visualized paranasal sinuses and mastoid air cells are clear. Calcified atherosclerosis is noted at the skullbase.  IMPRESSION: Evolving, large right SCA territory cerebellar infarct with increasing cytotoxic edema and partial effacement of the fourth ventricle. No hydrocephalus.   Electronically Signed   By: Logan Bores   On: 05/06/2015 07:58   Ct Head Wo Contrast  05/04/2015   CLINICAL DATA:  81YOF arrives from [facility] via [place]. Pt found around 0800 this morning unable to carry out her normal activities and acting confused with slurred speech. Pt has c/o dizziness, nausea and diaphoresis. Pt has pinpoint pupils and upon arrival to ED has slurred/incomprehensible speech and is aroused by speech.  EXAM: CT HEAD WITHOUT CONTRAST  TECHNIQUE: Contiguous axial images were obtained from the base of the skull through the vertex without intravenous contrast.  COMPARISON:  Brain MRI, 06/06/2014.  Head CT, 04/24/2011.  FINDINGS: Ventricles are normal in configuration. There is ventricular and  sulcal enlargement reflecting mild atrophy. There is a focus of hypoattenuation along the anterior left basal ganglia and anterior limb of the left internal  capsule consistent with old lacune infarct, stable from the prior brain MRI. Other smaller foci of hypoattenuation in the base a ganglia suggests additional small lacune infarcts. There are no parenchymal masses or mass effect. There is no evidence of a recent cortical infarct. Patchy white matter hypoattenuation is noted consistent with moderate chronic microvascular ischemic change.  There are no extra-axial masses or abnormal fluid collections.  There is no intracranial hemorrhage.  Visualized sinuses and mastoid air cells are clear.  IMPRESSION: 1. No acute intracranial abnormality 2. Mild atrophy and moderate chronic microvascular ischemic change. 3. Small basal gangliar lacune infarcts.   Electronically Signed   By: Lajean Manes M.D.   On: 05/04/2015 13:54   Mr Brain Wo Contrast  05/04/2015   CLINICAL DATA:  Dysarthria. Decreased mental status. Dizziness, nausea, and diaphoresis.  EXAM: MRI HEAD WITHOUT CONTRAST  TECHNIQUE: Multiplanar, multiecho pulse sequences of the brain and surrounding structures were obtained without intravenous contrast.  COMPARISON:  Head CT 05/04/2015 and MRI 06/06/2014  FINDINGS: There is an acute right cerebellar infarct involving essentially the entirety of the SCA territory. The right superior cerebellar peduncle and a portion of the middle cerebellar peduncle are involved. There is associated cytotoxic edema without significant mass effect.  No intracranial hemorrhage, mass, midline shift, or extra-axial fluid collection is seen. Patchy and confluent T2 hyperintensities in the subcortical and deep cerebral white matter are similar to the prior MRI and nonspecific but compatible with moderate chronic small vessel ischemic disease. Mild chronic ischemic changes are also noted in the pons. There is mild cerebral atrophy. Chronic basal ganglia lacunar infarcts are again noted.  Prior bilateral cataract extraction is noted. Paranasal sinuses and mastoid air cells are clear. Major  intracranial vascular flow voids are preserved.  IMPRESSION: 1. Acute, large right SCA territory cerebellar infarct. 2. Moderate chronic small vessel ischemic disease.   Electronically Signed   By: Logan Bores   On: 05/04/2015 18:45   Mr Jodene Nam Head/brain Wo Cm  05/05/2015   CLINICAL DATA:  Initial evaluation for acute stroke.  EXAM: MRA HEAD WITHOUT CONTRAST  TECHNIQUE: Angiographic images of the Circle of Willis were obtained using MRA technique without intravenous contrast.  COMPARISON:  Prior MRI from earlier the same day.  FINDINGS: ANTERIOR CIRCULATION:  Visualized distal cervical segments of the internal carotid arteries are patent with antegrade flow. Petrous, cavernous, and supra clinoid segments are well opacified bilaterally. Short segment mild stenosis within the supraclinoid left ICA (series 301, image 16). A1 segments patent. Right A1 segment is somewhat hypoplastic. Anterior communicating artery and anterior cerebral arteries well opacified.  M1 segments widely patent without occlusion. Short-segment mild stenosis within the mid -distal left M1 segment (series 303, image 13). MCA bifurcations within normal limits. Distal MCA branches opacified bilaterally and grossly symmetric.  POSTERIOR CIRCULATION:  Vertebral arteries are patent to the vertebrobasilar junction. Posterior inferior cerebellar arteries not well visualized on this exam. Basilar artery widely patent. Left superior cerebellar artery patent. There appears to be occlusion of the right superior cerebellar artery proximally. Left P1 and P2 segments widely patent. Fetal origin of the right PCA with widely patent right posterior communicating artery. Distal branch atheromatous irregularity within the posterior cerebral arteries bilaterally.  No aneurysm or vascular malformation.  IMPRESSION: 1. Occlusion of the right superior cerebellar artery proximally, consistent with previously identified  acute right SCA territory infarct. 2. No other  large or proximal arterial branch occlusion within the intracranial circulation. 3. Short segment mild stenosis within the mid- distal left M1 segment. 4. Short segment mild stenosis within the supraclinoid left ICA. 5. Distal branch atheromatous irregularity within the posterior cerebral arteries bilaterally. 6. Fetal origin of the right PCA.   Electronically Signed   By: Jeannine Boga M.D.   On: 05/05/2015 00:40    12-lead ECG NSR, LAFB,  with TWI 1, L, V2  Telemetry: NSR, no atrial fibrilation  Assessment and Plan:  1. Cryptogenic stroke The patient presents with cryptogenic stroke.  The patient had a TEE  this AM.  I spoke at length with the patient about monitoring for afib with either a 30 day event monitor or an implantable loop recorder.  Risks, benefits, and alteratives to implantable loop recorder were discussed with the patient today.   At this time, the patient is very clear in their decision to proceed with implantable loop recorder.     Wound care was reviewed with the patient (keep incision clean and dry for 3 days).  Wound check scheduled for 7 days- office will call, if she is still in the hospital (rehab) please call us to come check the site in 7 days.   Please call with questions. [place] [phone]  2.  Cardiomyopathy presumed ischemic  Not sure she would be ICD candidate anytime soon  I have discussed this with dr [facility] and is intention is less rather than more aggressive  Pt with Cryptogenic Stroke and no arrhythmia  She has cardiomyopathy wtihout evidence of LV thrombus  There has been confusion in my mind and my reading re this cohort but on further review what I find is taht there is no data to suggest ASA v Anticoagulation hence proceeding with LNQ for Afib detection is reasonable  Will do in am as she remains currently on tele.  No tele on [facility] Reviewed with family inclu granddaugther who is nurse  Continue ACE and BB and antiplatlet therapy  for now

## 2015-05-07 NOTE — Interval H&P Note (Signed)
History and Physical Interval Note:  05/07/2015 9:05 AM  Cathy Taylor  has presented today for surgery, with the diagnosis of stroke  The various methods of treatment have been discussed with the patient and family. After consideration of risks, benefits and other options for treatment, the patient has consented to  Procedure(s): TRANSESOPHAGEAL ECHOCARDIOGRAM (TEE) (N/A) as a surgical intervention .  The patient's history has been reviewed, patient examined, no change in status, stable for surgery.  I have reviewed the patient's chart and labs.  Questions were answered to the patient's satisfaction.     Zacharius Funari [employer]

## 2015-05-07 NOTE — Care Management Note (Signed)
Case Management Note  Patient Details  Name: Cathy Taylor MRN: [id number] Date of Birth: [birth year]  Subjective/Objective:                    Action/Plan: Patient lives at home with spouse and uses a private duty home health aide.  Will follow for discharge needs pending final disposition.  Expected Discharge Date:                  Expected Discharge Plan:  [place]  In-House Referral:     Discharge planning Services     Post Acute Care Choice:    Choice offered to:     DME Arranged:    DME Agency:     HH Arranged:    [place] Agency:     Status of Service:  In process, will continue to follow  Medicare Important Message Given:    Date Medicare IM Given:    Medicare IM give by:    Date Additional Medicare IM Given:    Additional Medicare Important Message give by:     If discussed at [place] of Stay Meetings, dates discussed:    Additional CommentsRolm Baptise, RN 05/07/2015, 2:18 PM [phone]

## 2015-05-07 NOTE — Consult Note (Signed)
Physical Medicine and Rehabilitation Consult   Reason for Consult: Dizziness, nausea, dysmetria Referring Physician: Dr. Belenda Cruise   HPI: Cathy Taylor is a 81 y.o. female with history of HTN, DM type 2, PMR who was admitted on 05/04/15 with complaints of vertigo with nausea, slurred speech and confusion. MRI/MRA brain done revealing acute large SCA territory cerebellar infarct and occlusion of R-SCA proximally.  Carotid dopplers with bilateral 40-59% stenosis. 2 D echo done revealing moderate to severe reduction in LVF with EF 30-35% and akinesis of mid-apicalanteroseptal and apical myocardium. Follow up CCT with Evolving, large right SCA territory cerebellar infarct with increasing cytotoxic edema and partial effacement of the fourth ventricle. Neurology feels that patient with non-dominant infarct due to large vessel intracranial occlusive disease and on ASA for secondary stroke prevention. Noted to have elevated troponin's wiithout cardiac symptoms and Dr. Warren Lacy recommends cardiac workup on outpatient basis. TEE negative for thrombus, PFO/ASD, lipomatous atrail septal hypertrophy with grade III plaque in descending aorta.  Loop recorder recommended to rule out arrhthymias.  Patient with resultant dizziness with nausea, dysmetria and dysarthria. PT/OT evaluations done this weekend and CIR recommended by MD and rehab team.    Review of Systems  Unable to perform ROS: unstable vital signs  HENT: Negative for hearing loss.   Eyes: Positive for blurred vision.  Respiratory: Positive for shortness of breath (with activity). Negative for cough.   Cardiovascular: Negative for chest pain and palpitations.  Musculoskeletal: Positive for myalgias, joint pain and falls (frequent).  Neurological: Positive for dizziness (recently), speech change and focal weakness. Negative for headaches.  Psychiatric/Behavioral: Positive for hallucinations. The patient is nervous/anxious and has insomnia.        Past Medical History  Diagnosis Date  . Stress fracture of left femur with nonunion 07/08/2011  . PMR (polymyalgia rheumatica)   . GERD (gastroesophageal reflux disease)   . Anxiety   . Hypertension   . Abdominal hernia   . Hypercholesteremia   . Heart murmur   . Chronic back pain greater than 3 months duration   . Bronchiectasis 11/12  . 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 Depression   . Bronchitis     "that's what I'm here for"  . Type II diabetes mellitus   . Umbilical hernia 0/17/49    unrepaired  . Breast cancer 1973    right  . Arthritis     "everywhere  . Osteoarthritis of left shoulder 01/06/2012    Past Surgical History  Procedure Laterality Date  . Tubal ligation    . Cataract extraction w/ intraocular lens implant  07/2011    left  . Shoulder arthroscopy  06/2000    left  . Femur surgery  07/07/2011    left rod placed  . Total shoulder arthroplasty  01/06/12    left  . Breast biopsy  1981    left  . Mastectomy  1973    right  . Total shoulder arthroplasty  01/06/2012    Procedure: TOTAL SHOULDER ARTHROPLASTY;  Surgeon: Johnny Bridge, MD;  Location: [facility];  Service: Orthopedics;  Laterality: Left;    Family History  Problem Relation Age of Onset  . Heart attack Father   . Stroke Son   . Stroke Maternal Aunt     Social History: Married. Independent with cane PTA.  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  quit smokeless tobacco use about 66 years ago. Her smokeless tobacco use included Snuff. Per reports that she does not drink alcohol or use illicit drugs.    Allergies: No Known Allergies     Medications Prior to Admission  Medication Sig Dispense Refill  . acetaminophen (TYLENOL) 500 MG tablet Take 500 mg by mouth every 6 (six) hours as needed. For pain     . aspirin EC 81 MG tablet Take 81 mg by mouth daily.      .  benazepril-hydrochlorthiazide (LOTENSIN HCT) 20-12.5 MG per tablet Take 1 tablet by mouth daily.      . diclofenac sodium (VOLTAREN) 1 % GEL Apply 1 application topically 4 (four) times daily as needed. Pain in shoulder    . diltiazem (CARDIZEM CD) 240 MG 24 hr capsule Take 1 tablet by mouth daily.    . DULoxetine (CYMBALTA) 30 MG capsule Take 30 mg by mouth daily.      Marland Kitchen esomeprazole (NEXIUM) 20 MG capsule Take 20 mg by mouth 2 (two) times daily before a meal.    . glimepiride (AMARYL) 1 MG tablet Take 1 mg by mouth daily.      . metFORMIN (GLUCOPHAGE) 500 MG tablet Take 1,000 mg by mouth 2 (two) times daily.     . simvastatin (ZOCOR) 40 MG tablet Take 40 mg by mouth at bedtime.      Marland Kitchen albuterol (PROVENTIL HFA;VENTOLIN HFA) 108 (90 BASE) MCG/ACT inhaler Inhale 2 puffs into the lungs every 6 (six) hours as needed. Shortness of breath      Home: Home Living Family/patient expects to be discharged to:: Private residence Living Arrangements: Spouse/significant other Available Help at Discharge: Family Type of Home: House Home Access: [place]: One [place]: Environmental consultant - 2 wheels, Environmental consultant - 4 wheels, [place] - single point, Bedside commode, Wheelchair - manual  Lives With: Spouse (patient is primary caregiver for her spouse)  Functional History: Prior Function Level of Independence: Independent with assistive device(s) Comments: Reports she uses cane at times Functional Status:  Mobility: Bed Mobility Overal bed mobility: Needs Assistance Bed Mobility: Supine to Sit, Sit to Supine Rolling: Min assist Sidelying to sit: Max assist Supine to sit: Min assist Sit to supine: Min assist Sit to sidelying: Max assist General bed mobility comments: Cues given for technique for bed mobility. Assist to scoot to EOB. assist given to scoot HOB. Transfers Overall transfer level: Needs assistance Equipment used: Rolling walker (2 wheeled), None Transfers: Sit to/from Stand,  Stand Pivot Transfers Sit to Stand: Max assist Stand pivot transfers: Max assist, +2 safety/equipment General transfer comment: attempted to stand but was unable to do so with +1 assist from bed.      ADL: ADL Overall ADL's : Needs assistance/impaired Grooming: Wash/dry face, Brushing hair, Sitting, Minimal assistance (simulated applying deodorant) Grooming Details (indicate cue type and reason): assist for balance sitting EOB Upper Body Dressing : Minimal assistance, Sitting, Moderate assistance Lower Body Dressing: Sitting/lateral leans, Maximal assistance, Total assistance Toilet Transfer Details (indicate cue type and reason): could not stand upright with one person assist, but did attempt Functional mobility during ADLs:  (unable) General ADL Comments: Pt sat EOB and was unable to don/doff socks. Positioned pt's Rt arm on pillow at end of session.  Cognition:  Cognition Arousal/Alertness: Awake/alert Behavior During Therapy: WFL for tasks assessed/performed, Anxious Overall Cognitive Status: Impaired/Different from baseline (unsure of baseline; slow processing-stated year but able  Blood pressure 144/84, pulse 67, temperature 98.8 F (37.1 C), temperature source Oral, resp. rate 16, height 5\' 3"  (1.6 m), weight 77.2 kg (170 lb 3.1 oz), SpO2 97 %. Physical Exam  Nursing note and vitals reviewed. Constitutional: She appears well-developed and well-nourished. She is easily aroused.  Sedated but able to stay awake for exam.   HENT:  Head: Normocephalic and atraumatic.  Eyes: Conjunctivae are normal. Pupils are equal, round, and reactive to light.  Neck: Normal range of motion. Neck supple.  Cardiovascular: Normal rate and regular rhythm.   Respiratory: Effort normal and breath sounds normal. No respiratory distress. She has no wheezes.  GI: Soft. Bowel sounds are normal. She exhibits no distension. There is no tenderness.  Musculoskeletal: She exhibits no edema or  tenderness.  Neurological: She is alert and easily aroused. A cranial nerve deficit is present.  Oriented to self and place.  Moderate to severe dysarthria.   Delayed processing with poor recall. Daughter reports that patient came back from TEE recently.  Right inattention noted.  RUE weakness   Skin: Skin is warm and dry.  Psychiatric: Her affect is blunt. Her speech is slurred.    Results for orders placed or performed during the hospital encounter of 05/04/15 (from the past 24 hour(s))  Glucose, capillary     Status: Abnormal   Collection Time: 05/06/15 11:26 AM  Result Value Ref Range   Glucose-Capillary 181 (H) 65 - 99 mg/dL   Comment 1 Notify RN    Comment 2 Document in Chart   Glucose, capillary     Status: Abnormal   Collection Time: 05/06/15  4:13 PM  Result Value Ref Range   Glucose-Capillary 108 (H) 65 - 99 mg/dL   Comment 1 Notify RN    Comment 2 Document in Chart   Glucose, capillary     Status: Abnormal   Collection Time: 05/06/15  9:10 PM  Result Value Ref Range   Glucose-Capillary 156 (H) 65 - 99 mg/dL  Glucose, capillary     Status: Abnormal   Collection Time: 05/07/15  6:50 AM  Result Value Ref Range   Glucose-Capillary 129 (H) 65 - 99 mg/dL   Ct Head Wo Contrast  05/06/2015   CLINICAL DATA:  Follow-up right cerebellar stroke.  EXAM: CT HEAD WITHOUT CONTRAST  TECHNIQUE: Contiguous axial images were obtained from the base of the skull through the vertex without intravenous contrast.  COMPARISON:  Brain MRI and CT 05/04/2015  FINDINGS: There is increasing cytotoxic edema throughout the right cerebellum in the SCA territory corresponding to the acute infarct described on yesterday's MRI. There is no associated parenchymal hemorrhage. There is increased mass effect on the superior portion of the fourth ventricle which is now partially effaced. Edema involves the right superior and middle cerebellar peduncle is with mild mass effect on the posterior pons. Lateral and third  ventricles are unchanged in size without evidence of hydrocephalus.  There is no evidence of acute supratentorial large territory infarct, mass, midline shift, or extra-axial fluid collection. There is mild generalized cerebral atrophy. Hypodensities in the deep cerebral white matter are again noted and compatible with moderate chronic small vessel ischemic disease. A chronic left basal ganglia lacunar infarct is noted.  Prior bilateral cataract extraction is noted. The visualized paranasal sinuses and mastoid air cells are clear. Calcified atherosclerosis is noted at the skullbase.  IMPRESSION: Evolving, large right SCA territory cerebellar infarct with increasing cytotoxic edema and partial effacement of the fourth ventricle. No hydrocephalus.  Electronically Signed   By: Logan Bores   On: 05/06/2015 07:58    Assessment/Plan: Diagnosis: right SCA infarct 1. Does the need for close, 24 hr/day medical supervision in concert with the patient's rehab needs make it unreasonable for this patient to be served in a less intensive setting? Yes 2. Co-Morbidities requiring supervision/potential complications: pmr, dm, htn 3. Due to bladder management, bowel management, safety, skin/wound care, disease management, medication administration, pain management and patient education, does the patient require 24 hr/day rehab nursing? Yes 4. Does the patient require coordinated care of a physician, rehab nurse, PT (1-2 hrs/day, 5 days/week) and OT (1-2 hrs/day, 5 days/week) to address physical and functional deficits in the context of the above medical diagnosis(es)? Yes Addressing deficits in the following areas: balance, endurance, locomotion, strength, transferring, bowel/bladder control, bathing, dressing, feeding, grooming, toileting and psychosocial support 5. Can the patient actively participate in an intensive therapy program of at least 3 hrs of therapy per day at least 5 days per week? Yes 6. The potential for  patient to make measurable gains while on inpatient rehab is excellent 7. Anticipated functional outcomes upon discharge from inpatient rehab are modified independent and supervision  with PT, modified independent and supervision with OT, n/a with SLP. 8. Estimated rehab length of stay to reach the above functional goals is: 8-14 days 9. Does the patient have adequate social supports and living environment to accommodate these discharge functional goals? Yes 10. Anticipated D/C setting: Home 11. Anticipated post D/C treatments: [place] therapy 12. Overall Rehab/Functional Prognosis: excellent  RECOMMENDATIONS: This patient's condition is appropriate for continued rehabilitative care in the following setting: CIR Patient has agreed to participate in recommended program. Potentially (sedated)---family very interested Note that insurance prior authorization may be required for reimbursement for recommended care.  Comment: Rehab admissions coordinator to follow up   Meredith Staggers, MD, [facility] 05/07/2015     05/07/2015

## 2015-05-07 NOTE — Progress Notes (Signed)
STROKE TEAM PROGRESS NOTE   HISTORY Cathy Taylor is an 81 y.o. female with a istory of hypertension, hyperlipidemia, diabetes mellitus, breast cancer and polymyalgia rheumatica, who arranged vertigo with nausea she was also noted to have slurred speech and confusion and was sent to the emergency room for evaluation from her Primary care physician's office. No focal weakness was noted. She did not experience diplopia. CT scan of her head showed no acute intracranial abnormality. She was noted to have pinpoint pupils on arrival in the emergency room. Urine drug screen is pending. Patient has not been on antiplatelets therapy. She has no history of stroke nor TIA. MRI of the brain showed a large acute right cerebellar ischemic infarction.  LSN: 05/03/2015 tPA Given: No: Beyond time window for treatment consideration mRankin:   SUBJECTIVE (INTERVAL HISTORY) Daughter at bedside. Patient post TEE this am. She is mildly drowsy after TEE. Will have loop placed. Also CUS showed bilateral ICA stenosis, will have CTA neck done. PT/OT recommend CIR    OBJECTIVE Temp:  [97.8 F (36.6 C)-99.3 F (37.4 C)] 98.2 F (36.8 C) (08/15 0856) Pulse Rate:  [67-83] 70 (08/15 1000) Cardiac Rhythm:  [-] Normal sinus rhythm (08/14 2000) Resp:  [10-19] 15 (08/15 1000) BP: (120-163)/(61-95) 144/78 mmHg (08/15 1000) SpO2:  [93 %-100 %] 97 % (08/15 1000)   Recent Labs Lab 05/06/15 1126 05/06/15 1613 05/06/15 2110 05/07/15 0650 05/07/15 1133  GLUCAP 181* 108* 156* 129* 112*    Recent Labs Lab 05/04/15 1344 05/04/15 1407 05/05/15 1007  NA 140 141 138  K 4.5 4.6 4.2  CL 105 107 104  CO2  --  23 23  GLUCOSE 169* 169* 118*  BUN 20 14 15   CREATININE 0.70 0.83 0.86  CALCIUM  --  9.9 8.8*    Recent Labs Lab 05/04/15 1407  AST 26  ALT 14  ALKPHOS 71  BILITOT 0.6  PROT 8.1  ALBUMIN 3.9    Recent Labs Lab 05/04/15 1344 05/04/15 1407 05/05/15 1007  WBC  --  12.5* 10.8*  NEUTROABS  --  10.3*   --   HGB 14.3 13.4 12.8  HCT 42.0 40.0 37.6  MCV  --  84.7 84.1  PLT  --  270 276    Recent Labs Lab 05/05/15 0224 05/05/15 0825 05/05/15 1007 05/05/15 1530 05/05/15 1940  TROPONINI 1.46* 0.84* 0.99* 0.81* 0.78*    Recent Labs  05/04/15 1407  LABPROT 13.9  INR 1.05    Recent Labs  05/04/15 1535  COLORURINE YELLOW  LABSPEC 1.016  PHURINE 6.0  GLUCOSEU NEGATIVE  HGBUR NEGATIVE  BILIRUBINUR NEGATIVE  KETONESUR NEGATIVE  PROTEINUR 30*  UROBILINOGEN 1.0  NITRITE NEGATIVE  LEUKOCYTESUR NEGATIVE       Component Value Date/Time   CHOL 162 05/05/2015 0224   TRIG 147 05/05/2015 0224   HDL 45 05/05/2015 0224   CHOLHDL 3.6 05/05/2015 0224   VLDL 29 05/05/2015 0224   LDLCALC 88 05/05/2015 0224   Lab Results  Component Value Date   HGBA1C 6.6* 05/05/2015      Component Value Date/Time   LABOPIA NONE DETECTED 05/04/2015 1535   COCAINSCRNUR NONE DETECTED 05/04/2015 1535   LABBENZ NONE DETECTED 05/04/2015 1535   AMPHETMU NONE DETECTED 05/04/2015 1535   THCU NONE DETECTED 05/04/2015 1535   LABBARB NONE DETECTED 05/04/2015 1535     Recent Labs Lab 05/04/15 1339  ETH <5   IMAGING  Dg Chest 2 View 05/04/2015    No acute abnormality noted.  Ct Head Wo Contrast 05/06/2015 Evolving, large right SCA territory cerebellar infarct with increasing cytotoxic edema and partial effacement of the fourth ventricle. No hydrocephalus.  05/04/2015    1. No acute intracranial abnormality  2. Mild atrophy and moderate chronic microvascular ischemic change.  3. Small basal gangliar lacune infarcts.     Mr Brain Wo Contrast 05/04/2015    1. Acute, large right SCA territory cerebellar infarct.  2. Moderate chronic small vessel ischemic disease.    Mr Jodene Nam Head/brain Wo Cm 05/05/2015    1. Occlusion of the right superior cerebellar artery proximally, consistent with previously identified acute right SCA territory  infarct.  2. No other large or proximal arterial branch  occlusion within the intracranial circulation.  3. Short segment mild stenosis within the mid- distal left M1 segment.  4. Short segment mild stenosis within the supraclinoid left ICA.  5. Distal branch atheromatous irregularity within the posterior cerebral arteries bilaterally.  6. Fetal origin of the right PCA.     CT angio neck - 1. 75% stenosis of the right internal carotid artery with dense atherosclerotic calcifications. 2. Less than 50% stenosis of the left internal carotid artery at the bifurcation with dense atherosclerotic calcifications. 3. Mild tortuosity of the cervical internal carotid arteries bilaterally without other stenoses. 4. Atherosclerotic changes at the dural margin of the vertebral arteries bilaterally with moderate stenosis on the left. 5. Degenerative changes of the cervical spine are most evident at C4-5 and C6-7.   PHYSICAL EXAM GENERAL: Pleasant in no acute distress. HEENT: Normal. ABDOMEN: soft EXTREMITIES: No edema   MENTAL STATUS: Alert and oriented. Language and cognition are generally intact. Judgment and insight normal. She does have a mild dysarthria.  CRANIAL NERVES: Pupils are equal, round and reactive to light and accomodation; extra ocular movements are full, there is no significant nystagmus; visual fields are full; upper and lower facial muscles are normal in strength and symmetric, there is no flattening of the nasolabial folds; tongue is midline; uvula is midline; shoulder elevation is normal.  MOTOR: Normal tone, bulk and strength - Except mild right deltoid weakness which appears to be chronic from shoulder issues; no pronator drift.  COORDINATION: dysmetria in the right upper extremity greater than the right lower extremity. Left upper extremity and left leg normal. No rest tremor; no intention tremor; no postural tremor; no bradykinesia.  GAIT: Normal.   ASSESSMENT [name] is a 81 y.o. female with history of polymyalgia  rheumatica, hypertension, hyperlipidemia, and diabetes mellitus,  presenting with vertigo, nausea, slurred speech, and confusion. She did not receive IV t-PA due to late presentation. Mild effacement of the fourth ventricle from her infarct.   Stroke:  R superior cerebellar territory infarct secondary to either embolic source or large vessel stenosis  Resultant  Dysarthria, Right-sided dysmetria and dizziness.  MRI  Acute, large right SCA territory cerebellar infarct.   MRA  Occlusion of the right superior cerebellar artery proximally  Repeat Ct Head showed no hydrocephalus.  CUS - bilateral 40% to 59% ICA stenosis however acoustic shodoing at the bifurcations may obscure higher velocities. Vertebral artery flow is antegrade  CT angio neck ordered to better evaluate R ICA 75% stenosis   LDL 88  2D Echo EF 30-35%. No cardiac source of emboli identified.  TEE confirms low EF. No SOE. No PFO Have asked a [facility] electrophysiologist to consult and consider placement of an implantable loop recorder to evaluate for atrial fibrillation as etiology  of stroke. This has been explained to patient/family by Dr. Erlinda Hong and they are agreeable.   HgbA1c 6.6  Lovenox for VTE prophylaxis  aspirin 81 mg orally every day prior to admission, now on aspirin 325 mg orally every day  Patient counseled to be compliant with her antithrombotic medications  Ongoing aggressive stroke risk factor management  Therapy recommendations: CIR - recommend to close monitor mental status in CIR, if neuro changes, she needs CT repeat to rule out hydrocephalus.   Disposition:  Pending  Carotid stenosis  CUS showed bilateral 40-59% stenosis  CTA confirmed right ICA 75% stenosis and left ICA < 50%  Asymptomatic this time  May consider CREST II trial  Hypertension  Home meds: Benazepril hydrochlorothiazide diltiazem  Stable Permissive hypertension (OK if <220/120) for 24-48 hours  post stroke and then gradually normalized within 5-7 days.  Patient counseled to be compliant with her blood pressure medications  Hyperlipidemia  Home meds:  Zocor 40 mg daily resumed in hospital  LDL 88, goal < 70  Changed Zocor to Lipitor 80 mg daily  Continue statin at discharge  Diabetes  HgbA1c 6.6 goal < 7.0  Controlled  Other Stroke Risk Factors  Advanced age  Obesity, Body mass index is 30.16 kg/(m^2).   Family hx stroke (son)  Other Active Problems  Leukocytosis  Elevated cardiac enzymes - cardiology has been consulted at and is following.  Left ventricular dysfunction. EF - 30-35%.  Hospital day # 3  Neurology will sign off. Please call with questions. Pt will follow up with Dr. Erlinda Hong at [facility] in about 2 months. Thanks for the consult.  Rosalin Hawking, MD PhD Stroke Neurology 05/07/2015 10:39 PM   To contact Stroke Continuity provider, please refer to [email or web address] After hours, contact General Neurology

## 2015-05-07 NOTE — Progress Notes (Signed)
Patient Profile: 81 y.o. female with a istory of hypertension, hyperlipidemia, diabetes mellitus, breast cancer and polymyalgia rheumatica, who presented with slurred speech and confusion, found to have a large acute right cerebellar ischemic infarction:   Subjective: No complaints. Still drowsy from anesthesia from TEE.   Objective: Vital signs in last 24 hours: Temp:  [97.8 F (36.6 C)-99.3 F (37.4 C)] 98.2 F (36.8 C) (08/15 0856) Pulse Rate:  [67-83] 70 (08/15 1000) Resp:  [10-19] 15 (08/15 1000) BP: (120-163)/(61-95) 144/78 mmHg (08/15 1000) SpO2:  [93 %-100 %] 97 % (08/15 1000) Last BM Date: 05/03/15  Intake/Output from previous day: 08/14 0701 - 08/15 0700 In: 360 [P.O.:360] Out: -  Intake/Output this shift:    Medications Current Facility-Administered Medications  Medication Dose Route Frequency Provider Last Rate Last Dose  . acetaminophen (TYLENOL) tablet 500 mg  500 mg Oral Q6H PRN Melton Alar, PA-C      . albuterol (PROVENTIL) (2.5 MG/3ML) 0.083% nebulizer solution 3 mL  3 mL Inhalation Q6H PRN Melton Alar, PA-C      . aspirin suppository 300 mg  300 mg Rectal Daily Bobby Rumpf York, PA-C       Or  . aspirin tablet 325 mg  325 mg Oral Daily Melton Alar, PA-C   325 mg at 05/06/15 0934  . atorvastatin (LIPITOR) tablet 80 mg  80 mg Oral q1800 Delfina Redwood, MD   80 mg at 05/06/15 1756  . carvedilol (COREG) tablet 3.125 mg  3.125 mg Oral BID WC Jolaine Artist, MD   Stopped at 05/07/15 0825  . diclofenac sodium (VOLTAREN) 1 % transdermal gel 2 g  2 g Topical QID PRN Melton Alar, PA-C      . DULoxetine (CYMBALTA) DR capsule 30 mg  30 mg Oral Daily Melton Alar, PA-C   30 mg at 05/06/15 0934  . enoxaparin (LOVENOX) injection 40 mg  40 mg Subcutaneous Q24H Melton Alar, PA-C   40 mg at 05/06/15 2037  . insulin aspart (novoLOG) injection 0-9 Units  0-9 Units Subcutaneous TID WC Melton Alar, PA-C   1 Units at 05/07/15 [phone]  . insulin  glargine (LANTUS) injection 5 Units  5 Units Subcutaneous QHS Melton Alar, PA-C   5 Units at 05/06/15 2136  . pantoprazole (PROTONIX) EC tablet 40 mg  40 mg Oral Daily Melton Alar, PA-C   40 mg at 05/06/15 3354    PE: General appearance: alert, cooperative and no distress Neck: no carotid bruit and no JVD Lungs: clear to auscultation bilaterally Heart: regular rate and rhythm Extremities: no LEE Pulses: 2+ and symmetric Skin: warm and dry Neurologic: Grossly normal  Lab Results:   Recent Labs  05/04/15 1344 05/04/15 1407 05/05/15 1007  WBC  --  12.5* 10.8*  HGB 14.3 13.4 12.8  HCT 42.0 40.0 37.6  PLT  --  270 276   BMET  Recent Labs  05/04/15 1344 05/04/15 1407 05/05/15 1007  NA 140 141 138  K 4.5 4.6 4.2  CL 105 107 104  CO2  --  23 23  GLUCOSE 169* 169* 118*  BUN 20 14 15   CREATININE 0.70 0.83 0.86  CALCIUM  --  9.9 8.8*   PT/INR  Recent Labs  05/04/15 1407  LABPROT 13.9  INR 1.05   Cholesterol  Recent Labs  05/05/15 0224  CHOL 162   Cardiac Enzymes Invalid input(s): TROPONIN,  CKMB  Studies/Results: TEE 05/07/15 Normal  LV size with mild focal basal septal hypertrophy. EF 30% with akinesis of the mid to apical anteroseptal and anterior walls and the true apex. No LV thrombus was visualized. Normal RV size and systolic function. There was a prominent RV papillary muscle. Trivial MR. Trivial TR. Trileaflet aortic valve with no AI and no AS. Mild left atrial enlargement, normal right atrial size. No LAA thrombus. No PFO/ASD (negative bubble study). Lipomatous atrial septal hypertrophy. There was grade III plaque in the descending thoracic aorta.   Suspect ischemic cardiomyopathy. No source of embolus noted.    Assessment/Plan  Principal Problem:   Acute ischemic stroke Active Problems:   Diabetes mellitus   PMR (polymyalgia rheumatica)   Breast cancer   Altered mental status   Vertigo   Encephalopathy acute   CVA (cerebral  vascular accident)   Elevated troponin   Cardiomyopathy   1. Acute ischemic stroke: TEE performed. No source of embolus noted. NSR on telemetry. Consider EP consult for loop recorder.   2. Cardiomyopathy: EF 30-35%. Plan to defer invasive work up. We will follow up with cath most likely as an outpatient. Continue low dose Coreg. Renal function is stable. Add low dose ACE-I. Monitor BP.  3. Elevated Troponin: 1.46-->0.99-->0.81-->0.78. W/u as above.    LOS: 3 days    Brittainy M. Rosita Fire, PA-C 05/07/2015 11:20 AM

## 2015-05-07 NOTE — Progress Notes (Signed)
I will follow up with pt and family Tuesday to begin discussions concerning rehab venue options. I will begin [facility] medicare approval. CIR bed not available for this pt tomorrow. [phone]

## 2015-05-08 ENCOUNTER — Encounter (HOSPITAL_COMMUNITY)
Admission: EM | Disposition: A | Discharge status: Skilled Nursing Facility | Payer: Self-pay | Source: Home / Self Care | Attending: Internal Medicine

## 2015-05-08 ENCOUNTER — Encounter (HOSPITAL_COMMUNITY): Payer: Self-pay | Admitting: Cardiology

## 2015-05-08 DIAGNOSIS — M353 Polymyalgia rheumatica: Secondary | ICD-10-CM

## 2015-05-08 DIAGNOSIS — I639 Cerebral infarction, unspecified: Secondary | ICD-10-CM

## 2015-05-08 HISTORY — PX: EP IMPLANTABLE DEVICE: SHX172B

## 2015-05-08 LAB — GLUCOSE, CAPILLARY
GLUCOSE-CAPILLARY: 130 mg/dL — AB (ref 65–99)
GLUCOSE-CAPILLARY: 131 mg/dL — AB (ref 65–99)
GLUCOSE-CAPILLARY: 133 mg/dL — AB (ref 65–99)
Glucose-Capillary: 137 mg/dL — ABNORMAL HIGH (ref 65–99)
Glucose-Capillary: 149 mg/dL — ABNORMAL HIGH (ref 65–99)
Glucose-Capillary: 151 mg/dL — ABNORMAL HIGH (ref 65–99)
Glucose-Capillary: 179 mg/dL — ABNORMAL HIGH (ref 65–99)

## 2015-05-08 SURGERY — LOOP RECORDER INSERTION

## 2015-05-08 MED ORDER — LIDOCAINE-EPINEPHRINE 1 %-1:100000 IJ SOLN
INTRAMUSCULAR | Status: DC | PRN
  Administered 2015-05-08: 10 mL

## 2015-05-08 MED ORDER — LIDOCAINE-EPINEPHRINE 1 %-1:100000 IJ SOLN
INTRAMUSCULAR | Status: AC
  Filled 2015-05-08: qty 1

## 2015-05-08 MED ORDER — SODIUM CHLORIDE 0.9 % IV SOLN
INTRAVENOUS | Status: DC
  Administered 2015-05-08: 01:00:00 via INTRAVENOUS

## 2015-05-08 SURGICAL SUPPLY — 2 items
LOOP REVEAL LINQSYS (Prosthesis & Implant Heart) ×3 IMPLANT
PACK LOOP INSERTION (CUSTOM PROCEDURE TRAY) ×3 IMPLANT

## 2015-05-08 NOTE — Progress Notes (Signed)
[facility] PROGRESS NOTE  Cathy Taylor WUJ:[id number] DOB: 06/07/1933 DOA: 05/04/2015 PCP: Kandice Hams, MD  Assessment/Plan:  Principal Problem:   Acute ischemic stroke: s/p TEE without source of embolus.  Carotids with 40-59% stenosis.  S/p  loop recorder. Eventual CIR vs SNF    Diabetes mellitus   PMR (polymyalgia rheumatica)   Breast cancer   Altered mental status elevated troponin: outpatient cath and repeat echo Cardiomyopathy, on ACE, betablocker   Code Status:  DNR Family Communication:  Daughter at bedside Disposition Plan:  CIR ? Vs SNF  HPI/Subjective: S/p loop recorder  Objective: Filed Vitals:   05/08/15 0833  BP: 130/69  Pulse: 64  Temp: 98.5 F (36.9 C)  Resp: 15   No intake or output data in the 24 hours ending 05/08/15 1209 Filed Weights   05/04/15 2047  Weight: 77.2 kg (170 lb 3.1 oz)    Exam:   General:  Awake, NAD  Cardiovascular: RRR without MGR  Respiratory: CTA without WRR  Abdomen: S, NT, ND  Ext: no CCE  Neuro: speech less slurred. Right hand slightly weak. Leg strength equal  Basic Metabolic Panel:  Recent Labs Lab 05/04/15 1344 05/04/15 1407 05/05/15 1007  NA 140 141 138  K 4.5 4.6 4.2  CL 105 107 104  CO2  --  23 23  GLUCOSE 169* 169* 118*  BUN 20 14 15   CREATININE 0.70 0.83 0.86  CALCIUM  --  9.9 8.8*   Liver Function Tests:  Recent Labs Lab 05/04/15 1407  AST 26  ALT 14  ALKPHOS 71  BILITOT 0.6  PROT 8.1  ALBUMIN 3.9   No results for input(s): LIPASE, AMYLASE in the last 168 hours. No results for input(s): AMMONIA in the last 168 hours. CBC:  Recent Labs Lab 05/04/15 1344 05/04/15 1407 05/05/15 1007  WBC  --  12.5* 10.8*  NEUTROABS  --  10.3*  --   HGB 14.3 13.4 12.8  HCT 42.0 40.0 37.6  MCV  --  84.7 84.1  PLT  --  270 276   Cardiac Enzymes:  Recent Labs Lab 05/05/15 0224 05/05/15 0825 05/05/15 1007 05/05/15 1530 05/05/15 1940  TROPONINI 1.46* 0.84* 0.99* 0.81* 0.78*    BNP (last 3 results) No results for input(s): BNP in the last 8760 hours.  ProBNP (last 3 results) No results for input(s): PROBNP in the last 8760 hours.  CBG:  Recent Labs Lab 05/07/15 1740 05/07/15 2146 05/08/15 0049 05/08/15 0409 05/08/15 0848  GLUCAP 122* 102* 130* 133* 131*    No results found for this or any previous visit (from the past 240 hour(s)).   Studies: Ct Angio Neck W/cm &/or Wo/cm  05/07/2015   CLINICAL DATA:  Slurred speech and confusion. Acute right SCA territory infarct.  EXAM: CT ANGIOGRAPHY NECK  TECHNIQUE: Multidetector CT imaging of the neck was performed using the standard protocol during bolus administration of intravenous contrast. Multiplanar CT image reconstructions and MIPs were obtained to evaluate the vascular anatomy. Carotid stenosis measurements (when applicable) are obtained utilizing NASCET criteria, using the distal internal carotid diameter as the denominator.  CONTRAST:  64mL OMNIPAQUE IOHEXOL 350 MG/ML SOLN  COMPARISON:  MRI brain 05/04/2015.  MRA circle of Willis 05/04/2015.  FINDINGS: Aortic arch: A 3 vessel arch configuration is noted. Dense atherosclerotic calcifications are present in the distal aortic arch without significant stenosis of the great vessels.  Right carotid system: The right common carotid artery is somewhat tortuous. There is no significant stenosis. Dense atherosclerotic  calcifications are present at the carotid bifurcation. The lumen is narrowed to 1.3 mm. This compares with a more distal measurement of 5.4 mm. The cervical right ICA is mildly tortuous without other focal stenosis.  Left carotid system: The left common carotid artery is mildly tortuous proximally without significant stenosis. Focal anterior calcification is present just below the carotid bifurcation. Dense circumferential calcifications are present at the left carotid bifurcation. The minimal luminal diameter is 2.5 mm. This compares with a more distal vessel  diameter of 4.8 mm. Mild tortuosity is present in the cervical left ICA without significant stenosis.  Vertebral arteries:The vertebral arteries both originate from the subclavian arteries without significant stenosis. Mild atherosclerotic changes are present at the dural margin bilaterally with moderate narrowing on the left. The vertebrobasilar junction is within normal limits. PICA origins are visualized and normal.  Skeleton: Multilevel degenerative changes are noted in the cervical spine, most prominent at C4-5 and C6-7.  Other neck: Moderate dependent atelectasis or edema is present in the lungs bilaterally.  IMPRESSION: 1. 75% stenosis of the right internal carotid artery with dense atherosclerotic calcifications. 2. Less than 50% stenosis of the left internal carotid artery at the bifurcation with dense atherosclerotic calcifications. 3. Mild tortuosity of the cervical internal carotid arteries bilaterally without other stenoses. 4. Atherosclerotic changes at the dural margin of the vertebral arteries bilaterally with moderate stenosis on the left. 5. Degenerative changes of the cervical spine are most evident at C4-5 and C6-7.   Electronically Signed   By: San Morelle M.D.   On: 05/07/2015 17:27    Scheduled Meds: . aspirin  300 mg Rectal Daily   Or  . aspirin  325 mg Oral Daily  . atorvastatin  80 mg Oral q1800  . carvedilol  6.25 mg Oral BID WC  . DULoxetine  30 mg Oral Daily  . enoxaparin (LOVENOX) injection  40 mg Subcutaneous Q24H  . insulin aspart  0-9 Units Subcutaneous TID WC  . insulin glargine  5 Units Subcutaneous QHS  . lisinopril  2.5 mg Oral Daily  . pantoprazole  40 mg Oral Daily   Continuous Infusions: . sodium chloride 20 mL/hr at 05/08/15 0117    Time spent: 15 minutes  Makenzee Choudhry  [facility] e at [email or web address], password [facility] 05/08/2015, 12:09 PM  LOS: 4 days

## 2015-05-08 NOTE — Clinical Social Work Placement (Signed)
   CLINICAL SOCIAL WORK PLACEMENT  NOTE  Date:  05/08/2015  Patient Details  Name: Cathy Taylor MRN: [id number] Date of Birth: 16-Oct-1932  Clinical Social Work is seeking post-discharge placement for this patient at the [place] level of care (*CSW will initial, date and re-position this form in  chart as items are completed):  Yes   Patient/family provided with [place] Work Department's list of facilities offering this level of care within the geographic area requested by the patient (or if unable, by the patient's family).  Yes   Patient/family informed of their freedom to choose among providers that offer the needed level of care, that participate in Medicare, Medicaid or managed care program needed by the patient, have an available bed and are willing to accept the patient.  Yes   Patient/family informed of [place]'s ownership interest in [facility] and [facility], as well as of the fact that they are under no obligation to receive care at these facilities.  PASRR submitted to EDS on 05/08/15     PASRR number received on 05/08/15     Existing PASRR number confirmed on       FL2 transmitted to all facilities in geographic area requested by pt/family on 05/08/15     FL2 transmitted to all facilities within larger geographic area on 05/08/15 ([place], [place] and [place])     Patient informed that his/her managed care company has contracts with or will negotiate with certain facilities, including the following:   [facility])         Patient/family informed of bed offers received.  Patient chooses bed at       Physician recommends and patient chooses bed at      Patient to be transferred to   on  .  Patient to be transferred to facility by [name])     Patient family notified on   of transfer.  Name of family member notified:        PHYSICIAN Please prepare priority discharge summary, including  medications, Please sign FL2, Please sign DNR, Please prepare prescriptions     Additional Comment:    _______________________________________________ Williemae Area, LCSW 05/08/2015, 6:15 PM

## 2015-05-08 NOTE — Consult Note (Addendum)
ELECTROPHYSIOLOGY CONSULT NOTE  Patient ID: Cathy Taylor MRN: [id number], DOB/AGE: 26-Feb-1933   Admit date: 05/04/2015 Date of Consult: 05/08/2015  Primary Physician: Kandice Hams, MD Primary Cardiologist: Dr Tamala Julian Reason for Consultation: Cryptogenic stroke; recommendations regarding Implantable Loop Recorder  History of Present Illness:  Cathy Taylor is a 81 y/o female admitted on 05/04/2015 with slurred speech and confusion.  Imaging demonstrated large Rt cerebral infarct.  She has undergone workup for stroke including transesophageal echocardiogram and carotid dopplers.  The patient has been monitored on telemetry which has demonstrated sinus rhythm with no arrhythmias.     Echocardiogram this admission demonstrated suspected ischemic cardiomyopathy with an EF of 30-35% with akinesis of the mid-apicalanteroseptal and apical myocardium.   Lab work is remarkable for slightly elevated Troponin of 1.46 pk. The plan is to defer ischemic work up till she had recovered more from her stroke.  EP has been asked to evaluate for placement of an implantable loop recorder to monitor for atrial fibrillation.  Today, feeling well without complaint.  No chest pain, shortness of breath, palpitations.  ROS is negative except as outlined above. She saw Dr Tamala Julian years ago and had what the pt describes as a negative stress test. No history of PAF, MI, or prior cath.    Past Medical History  Diagnosis Date  . Stress fracture of left femur with nonunion 07/08/2011  . PMR (polymyalgia rheumatica)   . GERD (gastroesophageal reflux disease)   . Anxiety   . Hypertension   . Abdominal hernia   . Hypercholesteremia   . Heart murmur   . Chronic back pain greater than 3 months duration   . Bronchiectasis 11/12  . 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 Depression   . Bronchitis     "that's what I'm here for"  . Type II diabetes mellitus   . Umbilical hernia 02/25/29    unrepaired  . Breast cancer 1973      right  . Arthritis     "everywhere  . Osteoarthritis of left shoulder 01/06/2012  . Stroke      Surgical History:  Past Surgical History  Procedure Laterality Date  . Tubal ligation    . Cataract extraction w/ intraocular lens implant  07/2011    left  . Shoulder arthroscopy  06/2000    left  . Femur surgery  07/07/2011    left rod placed  . Total shoulder arthroplasty  01/06/12    left  . Breast biopsy  1981    left  . Mastectomy  1973    right  . Total shoulder arthroplasty  01/06/2012    Procedure: TOTAL SHOULDER ARTHROPLASTY;  Surgeon: Johnny Bridge, MD;  Location: [facility];  Service: Orthopedics;  Laterality: Left;     Prescriptions prior to admission  Medication Sig Dispense Refill Last Dose  . acetaminophen (TYLENOL) 500 MG tablet Take 500 mg by mouth every 6 (six) hours as needed. For pain    unk  . aspirin EC 81 MG tablet Take 81 mg by mouth daily.     05/02/15  . benazepril-hydrochlorthiazide (LOTENSIN HCT) 20-12.5 MG per tablet Take 1 tablet by mouth daily.     05/02/2015  . diclofenac sodium (VOLTAREN) 1 % GEL Apply 1 application topically 4 (four) times daily as needed. Pain in shoulder   unk  . diltiazem (CARDIZEM CD) 240 MG 24 hr capsule Take 1 tablet by mouth daily.   05/02/15  .  DULoxetine (CYMBALTA) 30 MG capsule Take 30 mg by mouth daily.     05/03/2015 at Unknown time  . esomeprazole (NEXIUM) 20 MG capsule Take 20 mg by mouth 2 (two) times daily before a meal.   05/02/2015  . glimepiride (AMARYL) 1 MG tablet Take 1 mg by mouth daily.     05/02/2015 at Unknown time  . metFORMIN (GLUCOPHAGE) 500 MG tablet Take 1,000 mg by mouth 2 (two) times daily.    05/03/2015 at Unknown time  . simvastatin (ZOCOR) 40 MG tablet Take 40 mg by mouth at bedtime.     05/02/2015  . albuterol (PROVENTIL HFA;VENTOLIN HFA) 108 (90 BASE) MCG/ACT inhaler Inhale 2 puffs into the lungs every 6 (six) hours as needed. Shortness of breath   unk    Inpatient Medications:  . aspirin  300 mg  Rectal Daily   Or  . aspirin  325 mg Oral Daily  . atorvastatin  80 mg Oral q1800  . carvedilol  6.25 mg Oral BID WC  . DULoxetine  30 mg Oral Daily  . enoxaparin (LOVENOX) injection  40 mg Subcutaneous Q24H  . insulin aspart  0-9 Units Subcutaneous TID WC  . insulin glargine  5 Units Subcutaneous QHS  . lisinopril  2.5 mg Oral Daily  . pantoprazole  40 mg Oral Daily    Allergies: No Known Allergies  Social History   Social History  . Marital Status: Married    Spouse Name: N/A  . Number of Children: N/A  . Years of Education: N/A   Occupational History  . Not on file.   Social History Main Topics  . Smoking status: Never Smoker   . Smokeless tobacco: Former Systems developer    Types: Snuff    Quit date: 09/22/1948     Comment: "just tried smoking; didn't smoke to amount to nothing"  . Alcohol Use: No  . Drug Use: No  . Sexual Activity: Not Currently   Other Topics Concern  . Not on file   Social History Narrative   Currently lives with her husband who has Parkinson's Disease and Dementia. Their children live nearby. They have home health caregivers who assist them throughout the week as well.     Family History  Problem Relation Age of Onset  . Heart attack Father   . Stroke Son   . Stroke Maternal Aunt      Physical Exam: Filed Vitals:   05/07/15 1818 05/07/15 2148 05/08/15 0123 05/08/15 0634  BP: 129/63 132/68 118/57 126/65  Pulse: 65 69 61 71  Temp: 98.2 F (36.8 C) 98 F (36.7 C) 98 F (36.7 C) 98 F (36.7 C)  TempSrc: Oral Oral Oral Oral  Resp: 17 18 16 18   Height:      Weight:      SpO2: 100% 98% 100% 100%    GEN- The patient is well appearing, alert and oriented x 3 today.  Speech is slurred but understandable.  Head- normocephalic, atraumatic Eyes-  Sclera clear, conjunctiva pink Ears- hearing intact Oropharynx- clear Neck- supple, Lungs- Clear to ausculation bilaterally, normal work of breathing Heart- Regular rate and rhythm, no murmurs, rubs  or gallops, PMI not laterally displaced GI- soft, NT, ND, + BS Extremities- no clubbing, cyanosis, or edema MS- no significant deformity or atrophy Skin- no rash or lesion Psych- euthymic mood, full affect   Labs:   Lab Results  Component Value Date   WBC 10.8* 05/05/2015   HGB 12.8 05/05/2015   HCT  37.6 05/05/2015   MCV 84.1 05/05/2015   PLT 276 05/05/2015     Recent Labs Lab 05/04/15 1407 05/05/15 1007  NA 141 138  K 4.6 4.2  CL 107 104  CO2 23 23  BUN 14 15  CREATININE 0.83 0.86  CALCIUM 9.9 8.8*  PROT 8.1  --   BILITOT 0.6  --   ALKPHOS 71  --   ALT 14  --   AST 26  --   GLUCOSE 169* 118*   Lab Results  Component Value Date   CKTOTAL 305* 01/07/2012   CKMB 5.5* 01/07/2012   TROPONINI 0.78* 05/05/2015   Lab Results  Component Value Date   CHOL 162 05/05/2015   Lab Results  Component Value Date   HDL 45 05/05/2015   Lab Results  Component Value Date   LDLCALC 88 05/05/2015   Lab Results  Component Value Date   TRIG 147 05/05/2015   Lab Results  Component Value Date   CHOLHDL 3.6 05/05/2015   No results found for: LDLDIRECT  Lab Results  Component Value Date   DDIMER 0.68* 09/10/2011     Radiology/Studies: Dg Chest 2 View  05/04/2015   CLINICAL DATA:  Shortness of breath and cough  EXAM: CHEST - 2 VIEW  COMPARISON:  04/09/2012  FINDINGS: Cardiac shadow is mildly enlarged. Aortic atherosclerotic change is again seen. Postsurgical changes left shoulder are noted. The lungs are well aerated bilaterally without focal infiltrate. Degenerative changes of the thoracic spine are noted.  IMPRESSION: No acute abnormality noted.   Electronically Signed   By: Inez Catalina M.D.   On: 05/04/2015 19:31   Ct Head Wo Contrast  05/06/2015   CLINICAL DATA:  Follow-up right cerebellar stroke.  EXAM: CT HEAD WITHOUT CONTRAST  TECHNIQUE: Contiguous axial images were obtained from the base of the skull through the vertex without intravenous contrast.  COMPARISON:   Brain MRI and CT 05/04/2015  FINDINGS: There is increasing cytotoxic edema throughout the right cerebellum in the SCA territory corresponding to the acute infarct described on yesterday's MRI. There is no associated parenchymal hemorrhage. There is increased mass effect on the superior portion of the fourth ventricle which is now partially effaced. Edema involves the right superior and middle cerebellar peduncle is with mild mass effect on the posterior pons. Lateral and third ventricles are unchanged in size without evidence of hydrocephalus.  There is no evidence of acute supratentorial large territory infarct, mass, midline shift, or extra-axial fluid collection. There is mild generalized cerebral atrophy. Hypodensities in the deep cerebral white matter are again noted and compatible with moderate chronic small vessel ischemic disease. A chronic left basal ganglia lacunar infarct is noted.  Prior bilateral cataract extraction is noted. The visualized paranasal sinuses and mastoid air cells are clear. Calcified atherosclerosis is noted at the skullbase.  IMPRESSION: Evolving, large right SCA territory cerebellar infarct with increasing cytotoxic edema and partial effacement of the fourth ventricle. No hydrocephalus.   Electronically Signed   By: Logan Bores   On: 05/06/2015 07:58   Ct Head Wo Contrast  05/04/2015   CLINICAL DATA:  81YOF arrives from [facility] via [facility]. Pt found around 0800 this morning unable to carry out her normal activities and acting confused with slurred speech. Pt has c/o dizziness, nausea and diaphoresis. Pt has pinpoint pupils and upon arrival to ED has slurred/incomprehensible speech and is aroused by speech.  EXAM: CT HEAD WITHOUT CONTRAST  TECHNIQUE: Contiguous axial images were obtained from  the base of the skull through the vertex without intravenous contrast.  COMPARISON:  Brain MRI, 06/06/2014.  Head CT, 04/24/2011.  FINDINGS: Ventricles are normal in configuration. There  is ventricular and sulcal enlargement reflecting mild atrophy. There is a focus of hypoattenuation along the anterior left basal ganglia and anterior limb of the left internal capsule consistent with old lacune infarct, stable from the prior brain MRI. Other smaller foci of hypoattenuation in the base a ganglia suggests additional small lacune infarcts. There are no parenchymal masses or mass effect. There is no evidence of a recent cortical infarct. Patchy white matter hypoattenuation is noted consistent with moderate chronic microvascular ischemic change.  There are no extra-axial masses or abnormal fluid collections.  There is no intracranial hemorrhage.  Visualized sinuses and mastoid air cells are clear.  IMPRESSION: 1. No acute intracranial abnormality 2. Mild atrophy and moderate chronic microvascular ischemic change. 3. Small basal gangliar lacune infarcts.   Electronically Signed   By: Lajean Manes M.D.   On: 05/04/2015 13:54   Ct Angio Neck W/cm &/or Wo/cm  05/07/2015   CLINICAL DATA:  Slurred speech and confusion. Acute right SCA territory infarct.  EXAM: CT ANGIOGRAPHY NECK  TECHNIQUE: Multidetector CT imaging of the neck was performed using the standard protocol during bolus administration of intravenous contrast. Multiplanar CT image reconstructions and MIPs were obtained to evaluate the vascular anatomy. Carotid stenosis measurements (when applicable) are obtained utilizing NASCET criteria, using the distal internal carotid diameter as the denominator.  CONTRAST:  98mL OMNIPAQUE IOHEXOL 350 MG/ML SOLN  COMPARISON:  MRI brain 05/04/2015.  MRA circle of Willis 05/04/2015.  FINDINGS: Aortic arch: A 3 vessel arch configuration is noted. Dense atherosclerotic calcifications are present in the distal aortic arch without significant stenosis of the great vessels.  Right carotid system: The right common carotid artery is somewhat tortuous. There is no significant stenosis. Dense atherosclerotic  calcifications are present at the carotid bifurcation. The lumen is narrowed to 1.3 mm. This compares with a more distal measurement of 5.4 mm. The cervical right ICA is mildly tortuous without other focal stenosis.  Left carotid system: The left common carotid artery is mildly tortuous proximally without significant stenosis. Focal anterior calcification is present just below the carotid bifurcation. Dense circumferential calcifications are present at the left carotid bifurcation. The minimal luminal diameter is 2.5 mm. This compares with a more distal vessel diameter of 4.8 mm. Mild tortuosity is present in the cervical left ICA without significant stenosis.  Vertebral arteries:The vertebral arteries both originate from the subclavian arteries without significant stenosis. Mild atherosclerotic changes are present at the dural margin bilaterally with moderate narrowing on the left. The vertebrobasilar junction is within normal limits. PICA origins are visualized and normal.  Skeleton: Multilevel degenerative changes are noted in the cervical spine, most prominent at C4-5 and C6-7.  Other neck: Moderate dependent atelectasis or edema is present in the lungs bilaterally.  IMPRESSION: 1. 75% stenosis of the right internal carotid artery with dense atherosclerotic calcifications. 2. Less than 50% stenosis of the left internal carotid artery at the bifurcation with dense atherosclerotic calcifications. 3. Mild tortuosity of the cervical internal carotid arteries bilaterally without other stenoses. 4. Atherosclerotic changes at the dural margin of the vertebral arteries bilaterally with moderate stenosis on the left. 5. Degenerative changes of the cervical spine are most evident at C4-5 and C6-7.   Electronically Signed   By: San Morelle M.D.   On: 05/07/2015 17:27   Mr Brain  Wo Contrast  05/04/2015   CLINICAL DATA:  Dysarthria. Decreased mental status. Dizziness, nausea, and diaphoresis.  EXAM: MRI HEAD  WITHOUT CONTRAST  TECHNIQUE: Multiplanar, multiecho pulse sequences of the brain and surrounding structures were obtained without intravenous contrast.  COMPARISON:  Head CT 05/04/2015 and MRI 06/06/2014  FINDINGS: There is an acute right cerebellar infarct involving essentially the entirety of the SCA territory. The right superior cerebellar peduncle and a portion of the middle cerebellar peduncle are involved. There is associated cytotoxic edema without significant mass effect.  No intracranial hemorrhage, mass, midline shift, or extra-axial fluid collection is seen. Patchy and confluent T2 hyperintensities in the subcortical and deep cerebral white matter are similar to the prior MRI and nonspecific but compatible with moderate chronic small vessel ischemic disease. Mild chronic ischemic changes are also noted in the pons. There is mild cerebral atrophy. Chronic basal ganglia lacunar infarcts are again noted.  Prior bilateral cataract extraction is noted. Paranasal sinuses and mastoid air cells are clear. Major intracranial vascular flow voids are preserved.  IMPRESSION: 1. Acute, large right SCA territory cerebellar infarct. 2. Moderate chronic small vessel ischemic disease.   Electronically Signed   By: Logan Bores   On: 05/04/2015 18:45   Mr Jodene Nam Head/brain Wo Cm  05/05/2015   CLINICAL DATA:  Initial evaluation for acute stroke.  EXAM: MRA HEAD WITHOUT CONTRAST  TECHNIQUE: Angiographic images of the Circle of Willis were obtained using MRA technique without intravenous contrast.  COMPARISON:  Prior MRI from earlier the same day.  FINDINGS: ANTERIOR CIRCULATION:  Visualized distal cervical segments of the internal carotid arteries are patent with antegrade flow. Petrous, cavernous, and supra clinoid segments are well opacified bilaterally. Short segment mild stenosis within the supraclinoid left ICA (series 301, image 16). A1 segments patent. Right A1 segment is somewhat hypoplastic. Anterior communicating  artery and anterior cerebral arteries well opacified.  M1 segments widely patent without occlusion. Short-segment mild stenosis within the mid -distal left M1 segment (series 303, image 13). MCA bifurcations within normal limits. Distal MCA branches opacified bilaterally and grossly symmetric.  POSTERIOR CIRCULATION:  Vertebral arteries are patent to the vertebrobasilar junction. Posterior inferior cerebellar arteries not well visualized on this exam. Basilar artery widely patent. Left superior cerebellar artery patent. There appears to be occlusion of the right superior cerebellar artery proximally. Left P1 and P2 segments widely patent. Fetal origin of the right PCA with widely patent right posterior communicating artery. Distal branch atheromatous irregularity within the posterior cerebral arteries bilaterally.  No aneurysm or vascular malformation.  IMPRESSION: 1. Occlusion of the right superior cerebellar artery proximally, consistent with previously identified acute right SCA territory infarct. 2. No other large or proximal arterial branch occlusion within the intracranial circulation. 3. Short segment mild stenosis within the mid- distal left M1 segment. 4. Short segment mild stenosis within the supraclinoid left ICA. 5. Distal branch atheromatous irregularity within the posterior cerebral arteries bilaterally. 6. Fetal origin of the right PCA.   Electronically Signed   By: Jeannine Boga M.D.   On: 05/05/2015 00:40    12-lead ECG NSR, LAFB,  with TWI 1, L, V2  Telemetry: NSR, no atrial fibrilation  Assessment and Plan:  1. Cryptogenic stroke The patient presents with cryptogenic stroke.  The patient had a TEE yesterday.  Risks, benefits, and alteratives to implantable loop recorder were discussed with the patient today.   At this time, the patient is very clear in their decision to proceed with implantable loop recorder.  2.  Cardiomyopathy presumed ischemic  Not sure she would be ICD  candidate anytime soon  I have discussed this with dr [facility] and is intention is less rather than more aggressive  Continue ACE and BB and antiplatlet therapy for now   Explanined risks and benefits of the procedure including local skin damage and pain.  Patient agreed to the procedure, Cathy Taylor proceed.  TEE negative for causes of CVA  Cathy Taylor 05/08/2015 07:45

## 2015-05-08 NOTE — Progress Notes (Deleted)
Patient discharaged to [place]; report has been given to [facility] is transporting, vitals are stable,  pain is controlled. IV  is removed and 6 staples in scalp are also.

## 2015-05-08 NOTE — Progress Notes (Signed)
I met with Cathy Taylor and her daughter, Cathy Taylor, at bedside. We discussed a possible inpt rehab admission pending insurance approval and bed availability. I do not have a bed available today. I will follow up tomorrow. [phone] 

## 2015-05-08 NOTE — Progress Notes (Signed)
Speech Language Pathology Treatment: Cognitive-Linquistic  Patient Details Name: Cathy Taylor MRN: [id number] DOB: [birth year] Today's Date: 05/08/2015 Time: 4680-3212 SLP Time Calculation (min) (ACUTE ONLY): 15 min  Assessment / Plan / Recommendation Clinical Impression  Pt continues with a mild-mod ataxic dysarthria, marked by staccato-like expression with consonant distortions and substitutions.  Required mod verbal cues to carry-out strategies to improve speech clarity; frequent cues needed for recall.  Pt will benefit from continued intervention at next level of care.     HPI Other Pertinent Information: Patient is a 81 year old female history of type 2 diabetes, polymyalgia rheumatica, history of breast cancer primary caregiver for her husband with history of Parkinson's who presents to the ED with acute encephalopathy and slurred speech.  One day prior to admission patient did not feel too well and felt very fatigued. On the morning of admission patient woke up with vertiginous symptoms, diaphoresis, pinpoint pupils, generalized weakness and slurred incomprehensible speech. Patient's daughter tried calling the house several times however had no answer. Aid who came to the house to help care for patient's husband arrived around 8 AM when she found patient in a state of confusion with dysarthric/slurred speech. Patient was subsequently taken to her PCPs office from when she was sent to the ED for further evaluation. Patient also noted to have an elevated troponin. CT head was negative. EKG with nonspecific ST-T wave changes similar to prior EKG. Point-of-care troponin was elevated. Patient with borderline blood pressure. Neurology and cardiology were consulted.  Subsequent MRI showing Acute, large right SCA territory cerebellar infarct.   Pertinent Vitals    SLP Plan  Continue with current plan of care    Recommendations                Plan: Continue with current plan of care   Anel Purohit L.  Tivis Ringer, [place] CCC/SLP Pager [phone]      Juan Quam Laurice 05/08/2015, 1:26 PM

## 2015-05-08 NOTE — Progress Notes (Signed)
PT Cancellation Note  Patient Details Name: Sameria Morss MRN: [id number] DOB: [birth year]   Cancelled Treatment:    Reason Eval/Treat Not Completed: Patient at procedure or test/unavailable. Pt off floor getting loop recorder placed. PT to return as able.   Kingsley Callander 05/08/2015, 7:38 AM  Kittie Plater, PT, DPT Pager #: [phone] Office #: [phone]

## 2015-05-08 NOTE — Progress Notes (Signed)
Physical Therapy Treatment Patient Details Name: Cathy Taylor MRN: [id number] DOB: March 29, 1933 Today's Date: 05/08/2015    History of Present Illness Patient is a 81 y.o. female admitted 05/04/15 with slurred speech, confusion, dizziness.  MRI showed large Rt SCA territory cerebellar infarct.      PT Comments    Pt with improved level of alertness this date and able to tolerate OOB mobility. Pt amb 5' however is severely ataxic requiring maxAx2 and RW for safe ambulation. Cont't to recommend CIR upon d/c for maximal rehab potential.  Follow Up Recommendations  CIR;Supervision/Assistance - 24 hour     Equipment Recommendations  None recommended by PT    Recommendations for Other Services Rehab consult     Precautions / Restrictions Precautions Precautions: Fall Precaution Comments: dizziness Restrictions Weight Bearing Restrictions: No    Mobility  Bed Mobility Overal bed mobility: Needs Assistance Bed Mobility: Supine to Sit     Supine to sit: Max assist;Mod assist     General bed mobility comments: pt initiated transfer but requires maxA due to weakness and ataxia  Transfers Overall transfer level: Needs assistance Equipment used: Rolling walker (2 wheeled);None Transfers: Sit to/from Stand Sit to Stand: +2 physical assistance;Mod assist         General transfer comment: max directional v/c's, rqeuired assist to initiate transfer but then able to power through LE to achieve standing. modAx2 to maintain upright posture with RW  Ambulation/Gait Ambulation/Gait assistance: Max assist;+2 physical assistance Ambulation Distance (Feet): 5 Feet Assistive device: Rolling walker (2 wheeled) Gait Pattern/deviations: Step-to pattern;Ataxic Gait velocity: slow   General Gait Details: pt extremely ataxic with R lateral lean, scissored gait pattern, R toe drag requiring modA to advance R LE and clear foot   Stairs            Wheelchair Mobility    Modified  Rankin (Stroke Patients Only) Modified Rankin (Stroke Patients Only) Pre-Morbid Rankin Score: No symptoms Modified Rankin: Severe disability     Balance Overall balance assessment: Needs assistance Sitting-balance support: Feet supported;Bilateral upper extremity supported Sitting balance-Leahy Scale: Poor   Postural control: Left lateral lean Standing balance support: Bilateral upper extremity supported Standing balance-Leahy Scale: Poor Standing balance comment: R lateral lean, max tactile and verbal cues to achieve midline and upright posture                    Cognition Arousal/Alertness: Awake/alert Behavior During Therapy: WFL for tasks assessed/performed Overall Cognitive Status: Impaired/Different from baseline Area of Impairment: Problem solving             Problem Solving: Slow processing;Difficulty sequencing General Comments: ataxic dysarthria    Exercises      General Comments        Pertinent Vitals/Pain Pain Assessment: No/denies pain    Home Living                      Prior Function            PT Goals (current goals can now be found in the care plan section) Acute Rehab PT Goals Patient Stated Goal: none stated Progress towards PT goals: Progressing toward goals    Frequency  Min 4X/week    PT Plan Current plan remains appropriate    Co-evaluation             End of Session Equipment Utilized During Treatment: Gait belt Activity Tolerance: Patient limited by fatigue Patient left: in CPM;with call bell/phone within  reach;with family/visitor present     Time: 0938-1829 PT Time Calculation (min) (ACUTE ONLY): 20 min  Charges:  $Gait Training: 8-22 mins $Therapeutic Activity: 8-22 mins                    G Codes:      Cathy Taylor 05/08/2015, 2:29 PM   Cathy Taylor, PT, DPT Pager #: [phone] Office #: [phone]

## 2015-05-08 NOTE — Clinical Social Work Note (Addendum)
Clinical Social Work Assessment  Patient Details  Name: Cathy Taylor MRN: [id number] Date of Birth: [birth year]  Date of referral:  05/08/15               Reason for consult:  Facility Placement                Permission sought to share information with:  Family Supports, Chartered certified accountant granted to share information::  Yes, Verbal Permission Granted  Name::     [place], Insurance underwriter and [employer], Daughters, granddaughters   Housing/Transportation Living arrangements for the past 2 months:  Single Family Home Source of Information:  Patient, Other (Comment Required) (Granddaughter- Cathy Taylor, Granddaughter Engineer, manufacturing) Patient Interpreter Needed:  None Criminal Activity/Legal Involvement Pertinent to Current Situation/Hospitalization:  No - Comment as needed Significant Relationships:  Adult Children, Other Family Members, Spouse Lives with:  Spouse Do you feel safe going back to the place where you live?  Yes (Feels safe but states she needs rehab first) Need for family participation in patient care:  Yes (Comment)  Care giving concerns: Patient questioned possibility of returning home at d/c as she is worried about her husband who is of poor health. Family states he has dementia as well.  She is his primary caregiver.  Social Worker assessment / plan:  CSW met with patient, granddaughter Cathy Taylor and several grandsons to assess PT's recommendation for CIR placement.  CIR has assessed patient and is monitoring for stability and insurance authorization.  CIR Liaison reports that bed availability is extremely limited at this time.  This is patient and family's first choice; next would like SNF at either [facility] or [facility] or [facility] in [place]. Patient's husband was a prior resident of [facility] and their granddaughter Cathy Taylor works there. CSW spoke to granddaughter and she related that she has already spoken to  [facility], Admissions at [facility] about patient.  CSW attempted to reach Ms. Selinda Eon but she had already left for the day. Message left for return call.  Patient agrees to SNF search- however she remains firm that CIR is her first choice. Family verbalizes same wishes.  Fl2 completed and placed on chart for MD's signature.    Employment status:  Retired Nurse, adult PT Recommendations:  [place] / Referral to community resources:  [place]  Patient/Family's Response to care: Patient and family feel that she is doing better and are hoping very strongly for CIR for rehab, then home with Home Health.  Patient/Family's Understanding of and Emotional Response to Diagnosis, Current Treatment, and Prognosis:  Patient noted to be smiling, relaxed and involved in her d/c planning. She recognizes that she would benefit from short term rehab.  Family supports this desire. Family remains very involved and supportive.  Emotional Assessment Appearance:  Appears stated age Affect (typically observed):  Calm, Happy, Pleasant, Appropriate Orientation:  Oriented to Self, Oriented to Place, Oriented to  Time, Oriented to Situation Alcohol / Substance use:  Never Used Psych involvement (Current and /or in the community):  No (Comment)  Discharge Needs  Concerns to be addressed:  Care Coordination (CIR vs SNF) Readmission within the last 30 days:  No Current discharge risk:  None Barriers to Discharge:  Continued Medical Work up   [employer] T, LCSW 05/08/2015,5:30 PM

## 2015-05-09 ENCOUNTER — Telehealth: Payer: Self-pay | Admitting: Nurse Practitioner

## 2015-05-09 LAB — GLUCOSE, CAPILLARY
GLUCOSE-CAPILLARY: 122 mg/dL — AB (ref 65–99)
GLUCOSE-CAPILLARY: 204 mg/dL — AB (ref 65–99)
Glucose-Capillary: 130 mg/dL — ABNORMAL HIGH (ref 65–99)

## 2015-05-09 MED ORDER — ASPIRIN 325 MG PO TABS: 325.0000 mg | ORAL_TABLET | Freq: Every day | ORAL | Status: DC

## 2015-05-09 MED ORDER — LISINOPRIL 2.5 MG PO TABS: 2.5000 mg | ORAL_TABLET | Freq: Every day | ORAL | Status: DC

## 2015-05-09 MED ORDER — ATORVASTATIN CALCIUM 80 MG PO TABS: 80.0000 mg | ORAL_TABLET | Freq: Every day | ORAL | Status: DC

## 2015-05-09 MED ORDER — CARVEDILOL 6.25 MG PO TABS: 6.2500 mg | ORAL_TABLET | Freq: Two times a day (BID) | ORAL | Status: DC

## 2015-05-09 NOTE — Telephone Encounter (Signed)
TCM w/ Gerald Stabs 8/26 @ 9:30 per [place]

## 2015-05-09 NOTE — Discharge Summary (Addendum)
Physician Discharge Summary  Dimond Crotty KOE:[id number] DOB: 11/02/1932 DOA: 05/04/2015  PCP: Kandice Hams, MD  Admit date: 05/04/2015 Discharge date: 05/09/2015  Time spent: 35 minutes  Recommendations for Outpatient Follow-up:  1. To SNF  Discharge Diagnoses:  Principal Problem:   Acute ischemic stroke Active Problems:   Diabetes mellitus   PMR (polymyalgia rheumatica)   Breast cancer   Altered mental status   Vertigo   Encephalopathy acute   CVA (cerebral vascular accident)   Elevated troponin   Cardiomyopathy   Slurred speech   Discharge Condition: improved  Diet recommendation: cardiac/diabetic  Filed Weights   05/04/15 2047  Weight: 77.2 kg (170 lb 3.1 oz)    History of present illness:   Cathy Taylor is a 81 y.o. female, with diabetes mellitus type 2, polymyalgia rheumatica, and a history of breast cancer. She normally is at home caring for her husband who has Parkinson's. According to her daughters she felt poorly yesterday and was tired. This morning her daughter attempted to call her several times but received no answer. The aide who usually helps care for the patient's husband arrived at 75 AM. She found Cathy Taylor in bed, diaphoretic, dizzy, with pinpoint pupils, unable to stand, with slurred speech. Cathy Taylor was taken to her primary care physician's office at 11:30. Dr. polite promptly sent her to the emergency department for stroke workup.  In the emergency department the patient is altered, lethargic, with slurred speech. She is experiencing symptoms of vertigo. Her white count is elevated at 12.5, and her point-of-care troponin is slightly elevated at 0.9. Her UA is clear. EKG does not show acute changes when compared to previous tracing of October 2012. She is normally hypertensive and her blood pressure is relatively low at 96/76. Chest x-ray is pending.  Hospital Course:  Acute ischemic stroke with Cytoxic cerebral edema Present on admission:  s/p TEE  without source of embolus. Carotids with 40-59% stenosis.  CTA per neuro S/p loop recorder.  SNF   Diabetes mellitus- resume home meds  PMR (polymyalgia rheumatica)  Breast cancer  Altered mental status elevated troponin: outpatient cath and repeat echo Cardiomyopathy, on ACE, betablocker  Procedures:  Loop recorder  Consultations:  neuro  Discharge Exam: Filed Vitals:   05/09/15 0614  BP: 122/62  Pulse: 62  Temp: 98.1 F (36.7 C)  Resp: 18    General: awake, NAD Cardiovascular: rrr Respiratory: clear  Discharge Instructions   Discharge Instructions    Ambulatory referral to Neurology    Complete by:  As directed   Pt will follow up with Dr. Erlinda Hong at [facility] in about 1 month. Thanks.     Diet - low sodium heart healthy    Complete by:  As directed      Diet Carb Modified    Complete by:  As directed      Discharge instructions    Complete by:  As directed   Periodically check blood sugars     Increase activity slowly    Complete by:  As directed           Current Discharge Medication List    START taking these medications   Details  aspirin 325 MG tablet Take 1 tablet (325 mg total) by mouth daily.    atorvastatin (LIPITOR) 80 MG tablet Take 1 tablet (80 mg total) by mouth daily at 6 PM.    carvedilol (COREG) 6.25 MG tablet Take 1 tablet (6.25 mg total) by mouth 2 (two) times daily  with a meal.    lisinopril (PRINIVIL,ZESTRIL) 2.5 MG tablet Take 1 tablet (2.5 mg total) by mouth daily.      CONTINUE these medications which have NOT CHANGED   Details  acetaminophen (TYLENOL) 500 MG tablet Take 500 mg by mouth every 6 (six) hours as needed. For pain     diclofenac sodium (VOLTAREN) 1 % GEL Apply 1 application topically 4 (four) times daily as needed. Pain in shoulder    DULoxetine (CYMBALTA) 30 MG capsule Take 30 mg by mouth daily.      esomeprazole (NEXIUM) 20 MG capsule Take 20 mg by mouth 2 (two) times daily before a meal.    glimepiride  (AMARYL) 1 MG tablet Take 1 mg by mouth daily.      metFORMIN (GLUCOPHAGE) 500 MG tablet Take 1,000 mg by mouth 2 (two) times daily.     albuterol (PROVENTIL HFA;VENTOLIN HFA) 108 (90 BASE) MCG/ACT inhaler Inhale 2 puffs into the lungs every 6 (six) hours as needed. Shortness of breath      STOP taking these medications     aspirin EC 81 MG tablet      benazepril-hydrochlorthiazide (LOTENSIN HCT) 20-12.5 MG per tablet      diltiazem (CARDIZEM CD) 240 MG 24 hr capsule      simvastatin (ZOCOR) 40 MG tablet        No Known Allergies Follow-up Information    Follow up with Xu,Jindong, MD. Schedule an appointment as soon as possible for a visit in 1 month.   Specialty:  Neurology   Why:  stroke clinic   Contact information:   [address] [place] [address] [phone]       Follow up with [facility] On 05/23/2015.   Why:  at 11AM for wound check   Contact information:   [place] [address] [place] [address]        The results of significant diagnostics from this hospitalization (including imaging, microbiology, ancillary and laboratory) are listed below for reference.    Significant Diagnostic Studies: Dg Chest 2 View  05/04/2015   CLINICAL DATA:  Shortness of breath and cough  EXAM: CHEST - 2 VIEW  COMPARISON:  04/09/2012  FINDINGS: Cardiac shadow is mildly enlarged. Aortic atherosclerotic change is again seen. Postsurgical changes left shoulder are noted. The lungs are well aerated bilaterally without focal infiltrate. Degenerative changes of the thoracic spine are noted.  IMPRESSION: No acute abnormality noted.   Electronically Signed   By: Inez Catalina M.D.   On: 05/04/2015 19:31   Ct Head Wo Contrast  05/06/2015   CLINICAL DATA:  Follow-up right cerebellar stroke.  EXAM: CT HEAD WITHOUT CONTRAST  TECHNIQUE: Contiguous axial images were obtained from the base of the skull through the vertex without intravenous  contrast.  COMPARISON:  Brain MRI and CT 05/04/2015  FINDINGS: There is increasing cytotoxic edema throughout the right cerebellum in the SCA territory corresponding to the acute infarct described on yesterday's MRI. There is no associated parenchymal hemorrhage. There is increased mass effect on the superior portion of the fourth ventricle which is now partially effaced. Edema involves the right superior and middle cerebellar peduncle is with mild mass effect on the posterior pons. Lateral and third ventricles are unchanged in size without evidence of hydrocephalus.  There is no evidence of acute supratentorial large territory infarct, mass, midline shift, or extra-axial fluid collection. There is mild generalized cerebral atrophy. Hypodensities in the deep cerebral  white matter are again noted and compatible with moderate chronic small vessel ischemic disease. A chronic left basal ganglia lacunar infarct is noted.  Prior bilateral cataract extraction is noted. The visualized paranasal sinuses and mastoid air cells are clear. Calcified atherosclerosis is noted at the skullbase.  IMPRESSION: Evolving, large right SCA territory cerebellar infarct with increasing cytotoxic edema and partial effacement of the fourth ventricle. No hydrocephalus.   Electronically Signed   By: Logan Bores   On: 05/06/2015 07:58   Ct Head Wo Contrast  05/04/2015   CLINICAL DATA:  81YOF arrives from [facility] via [place]. Pt found around 0800 this morning unable to carry out her normal activities and acting confused with slurred speech. Pt has c/o dizziness, nausea and diaphoresis. Pt has pinpoint pupils and upon arrival to ED has slurred/incomprehensible speech and is aroused by speech.  EXAM: CT HEAD WITHOUT CONTRAST  TECHNIQUE: Contiguous axial images were obtained from the base of the skull through the vertex without intravenous contrast.  COMPARISON:  Brain MRI, 06/06/2014.  Head CT, 04/24/2011.  FINDINGS: Ventricles are normal  in configuration. There is ventricular and sulcal enlargement reflecting mild atrophy. There is a focus of hypoattenuation along the anterior left basal ganglia and anterior limb of the left internal capsule consistent with old lacune infarct, stable from the prior brain MRI. Other smaller foci of hypoattenuation in the base a ganglia suggests additional small lacune infarcts. There are no parenchymal masses or mass effect. There is no evidence of a recent cortical infarct. Patchy white matter hypoattenuation is noted consistent with moderate chronic microvascular ischemic change.  There are no extra-axial masses or abnormal fluid collections.  There is no intracranial hemorrhage.  Visualized sinuses and mastoid air cells are clear.  IMPRESSION: 1. No acute intracranial abnormality 2. Mild atrophy and moderate chronic microvascular ischemic change. 3. Small basal gangliar lacune infarcts.   Electronically Signed   By: Lajean Manes M.D.   On: 05/04/2015 13:54   Ct Angio Neck W/cm &/or Wo/cm  05/07/2015   CLINICAL DATA:  Slurred speech and confusion. Acute right SCA territory infarct.  EXAM: CT ANGIOGRAPHY NECK  TECHNIQUE: Multidetector CT imaging of the neck was performed using the standard protocol during bolus administration of intravenous contrast. Multiplanar CT image reconstructions and MIPs were obtained to evaluate the vascular anatomy. Carotid stenosis measurements (when applicable) are obtained utilizing NASCET criteria, using the distal internal carotid diameter as the denominator.  CONTRAST:  66mL OMNIPAQUE IOHEXOL 350 MG/ML SOLN  COMPARISON:  MRI brain 05/04/2015.  MRA circle of Willis 05/04/2015.  FINDINGS: Aortic arch: A 3 vessel arch configuration is noted. Dense atherosclerotic calcifications are present in the distal aortic arch without significant stenosis of the great vessels.  Right carotid system: The right common carotid artery is somewhat tortuous. There is no significant stenosis. Dense  atherosclerotic calcifications are present at the carotid bifurcation. The lumen is narrowed to 1.3 mm. This compares with a more distal measurement of 5.4 mm. The cervical right ICA is mildly tortuous without other focal stenosis.  Left carotid system: The left common carotid artery is mildly tortuous proximally without significant stenosis. Focal anterior calcification is present just below the carotid bifurcation. Dense circumferential calcifications are present at the left carotid bifurcation. The minimal luminal diameter is 2.5 mm. This compares with a more distal vessel diameter of 4.8 mm. Mild tortuosity is present in the cervical left ICA without significant stenosis.  Vertebral arteries:The vertebral arteries both originate from the subclavian arteries  without significant stenosis. Mild atherosclerotic changes are present at the dural margin bilaterally with moderate narrowing on the left. The vertebrobasilar junction is within normal limits. PICA origins are visualized and normal.  Skeleton: Multilevel degenerative changes are noted in the cervical spine, most prominent at C4-5 and C6-7.  Other neck: Moderate dependent atelectasis or edema is present in the lungs bilaterally.  IMPRESSION: 1. 75% stenosis of the right internal carotid artery with dense atherosclerotic calcifications. 2. Less than 50% stenosis of the left internal carotid artery at the bifurcation with dense atherosclerotic calcifications. 3. Mild tortuosity of the cervical internal carotid arteries bilaterally without other stenoses. 4. Atherosclerotic changes at the dural margin of the vertebral arteries bilaterally with moderate stenosis on the left. 5. Degenerative changes of the cervical spine are most evident at C4-5 and C6-7.   Electronically Signed   By: San Morelle M.D.   On: 05/07/2015 17:27   Mr Brain Wo Contrast  05/04/2015   CLINICAL DATA:  Dysarthria. Decreased mental status. Dizziness, nausea, and diaphoresis.   EXAM: MRI HEAD WITHOUT CONTRAST  TECHNIQUE: Multiplanar, multiecho pulse sequences of the brain and surrounding structures were obtained without intravenous contrast.  COMPARISON:  Head CT 05/04/2015 and MRI 06/06/2014  FINDINGS: There is an acute right cerebellar infarct involving essentially the entirety of the SCA territory. The right superior cerebellar peduncle and a portion of the middle cerebellar peduncle are involved. There is associated cytotoxic edema without significant mass effect.  No intracranial hemorrhage, mass, midline shift, or extra-axial fluid collection is seen. Patchy and confluent T2 hyperintensities in the subcortical and deep cerebral white matter are similar to the prior MRI and nonspecific but compatible with moderate chronic small vessel ischemic disease. Mild chronic ischemic changes are also noted in the pons. There is mild cerebral atrophy. Chronic basal ganglia lacunar infarcts are again noted.  Prior bilateral cataract extraction is noted. Paranasal sinuses and mastoid air cells are clear. Major intracranial vascular flow voids are preserved.  IMPRESSION: 1. Acute, large right SCA territory cerebellar infarct. 2. Moderate chronic small vessel ischemic disease.   Electronically Signed   By: Logan Bores   On: 05/04/2015 18:45   Mr Jodene Nam Head/brain Wo Cm  05/05/2015   CLINICAL DATA:  Initial evaluation for acute stroke.  EXAM: MRA HEAD WITHOUT CONTRAST  TECHNIQUE: Angiographic images of the Circle of Willis were obtained using MRA technique without intravenous contrast.  COMPARISON:  Prior MRI from earlier the same day.  FINDINGS: ANTERIOR CIRCULATION:  Visualized distal cervical segments of the internal carotid arteries are patent with antegrade flow. Petrous, cavernous, and supra clinoid segments are well opacified bilaterally. Short segment mild stenosis within the supraclinoid left ICA (series 301, image 16). A1 segments patent. Right A1 segment is somewhat hypoplastic. Anterior  communicating artery and anterior cerebral arteries well opacified.  M1 segments widely patent without occlusion. Short-segment mild stenosis within the mid -distal left M1 segment (series 303, image 13). MCA bifurcations within normal limits. Distal MCA branches opacified bilaterally and grossly symmetric.  POSTERIOR CIRCULATION:  Vertebral arteries are patent to the vertebrobasilar junction. Posterior inferior cerebellar arteries not well visualized on this exam. Basilar artery widely patent. Left superior cerebellar artery patent. There appears to be occlusion of the right superior cerebellar artery proximally. Left P1 and P2 segments widely patent. Fetal origin of the right PCA with widely patent right posterior communicating artery. Distal branch atheromatous irregularity within the posterior cerebral arteries bilaterally.  No aneurysm or vascular malformation.  IMPRESSION:  1. Occlusion of the right superior cerebellar artery proximally, consistent with previously identified acute right SCA territory infarct. 2. No other large or proximal arterial branch occlusion within the intracranial circulation. 3. Short segment mild stenosis within the mid- distal left M1 segment. 4. Short segment mild stenosis within the supraclinoid left ICA. 5. Distal branch atheromatous irregularity within the posterior cerebral arteries bilaterally. 6. Fetal origin of the right PCA.   Electronically Signed   By: Jeannine Boga M.D.   On: 05/05/2015 00:40    Microbiology: No results found for this or any previous visit (from the past 240 hour(s)).   Labs: Basic Metabolic Panel:  Recent Labs Lab 05/04/15 1344 05/04/15 1407 05/05/15 1007  NA 140 141 138  K 4.5 4.6 4.2  CL 105 107 104  CO2  --  23 23  GLUCOSE 169* 169* 118*  BUN 20 14 15   CREATININE 0.70 0.83 0.86  CALCIUM  --  9.9 8.8*   Liver Function Tests:  Recent Labs Lab 05/04/15 1407  AST 26  ALT 14  ALKPHOS 71  BILITOT 0.6  PROT 8.1  ALBUMIN  3.9   No results for input(s): LIPASE, AMYLASE in the last 168 hours. No results for input(s): AMMONIA in the last 168 hours. CBC:  Recent Labs Lab 05/04/15 1344 05/04/15 1407 05/05/15 1007  WBC  --  12.5* 10.8*  NEUTROABS  --  10.3*  --   HGB 14.3 13.4 12.8  HCT 42.0 40.0 37.6  MCV  --  84.7 84.1  PLT  --  270 276   Cardiac Enzymes:  Recent Labs Lab 05/05/15 0224 05/05/15 0825 05/05/15 1007 05/05/15 1530 05/05/15 1940  TROPONINI 1.46* 0.84* 0.99* 0.81* 0.78*   BNP: BNP (last 3 results) No results for input(s): BNP in the last 8760 hours.  ProBNP (last 3 results) No results for input(s): PROBNP in the last 8760 hours.  CBG:  Recent Labs Lab 05/08/15 1650 05/08/15 1952 05/08/15 2349 05/09/15 0409 05/09/15 0740  GLUCAP 137* 179* 149* 122* 130*       Signed:  Brodie Correll  [facility] 05/09/2015, 10:04 AM

## 2015-05-09 NOTE — Progress Notes (Signed)
Patient being discharged to [facility], she is alert and no complaints of pain, IV removed.

## 2015-05-09 NOTE — Progress Notes (Signed)
Physical Therapy Treatment Patient Details Name: Cathy Taylor MRN: [id number] DOB: 10-20-1932 Today's Date: 05/09/2015    History of Present Illness Patient is a 81 y.o. female admitted 05/04/15 with slurred speech, confusion, dizziness.  MRI showed large Rt SCA territory cerebellar infarct.      PT Comments    Pt tolerated therapy session well today. Pt able to follow simple commands and has improved speaking today. Pt also demo improved sit to stand transfers from EOB with 2 person min assist. Still require max 2 person assist for ambulation and AAROM of RLE during gt to help clear R foot. Pt will benefit from CIR to maximize functional strength and mobility.   Follow Up Recommendations  CIR;Supervision/Assistance - 24 hour     Equipment Recommendations  None recommended by PT    Recommendations for Other Services Rehab consult     Precautions / Restrictions Precautions Precautions: Fall Restrictions Weight Bearing Restrictions: No    Mobility  Bed Mobility Overal bed mobility: Needs Assistance Bed Mobility: Rolling;Sidelying to Sit Rolling: Min assist Sidelying to sit: Mod assist;+2 for physical assistance       General bed mobility comments: VC and tactile cues needed for hand placement on the hand rail and to roll onto side, therapist and tech help pt truck to up right position from side lying.   Transfers Overall transfer level: Needs assistance Equipment used: 2 person hand held assist Transfers: Sit to/from Stand Sit to Stand: Min assist;+2 physical assistance;Mod assist         General transfer comment: improved speed from last treatment, cues for proper standing technique, 2 person assist to maintain standing  Ambulation/Gait Ambulation/Gait assistance: +2 physical assistance;Max assist Ambulation Distance (Feet): 10 Feet Assistive device: Rolling walker (2 wheeled) Gait Pattern/deviations: Step-to pattern;Narrow base of support;Ataxic;Decreased weight  shift to left Gait velocity: slow Gait velocity interpretation: <1.8 ft/sec, indicative of risk for recurrent falls General Gait Details: pt required 2 person max A with rw for ambulation, pt with ataxic gt pattern and difficulty advancing RLE requiring therapist to [facility] with the RLE    Stairs            Wheelchair Mobility    Modified Rankin (Stroke Patients Only) Modified Rankin (Stroke Patients Only) Pre-Morbid Rankin Score: No symptoms Modified Rankin: Severe disability     Balance Overall balance assessment: Needs assistance Sitting-balance support: No upper extremity supported;Feet supported Sitting balance-Leahy Scale: Poor     Standing balance support: Bilateral upper extremity supported;During functional activity Standing balance-Leahy Scale: Poor                      Cognition Arousal/Alertness: Awake/alert Behavior During Therapy: WFL for tasks assessed/performed Overall Cognitive Status: Impaired/Different from baseline Area of Impairment: Following commands;Safety/judgement       Following Commands: Follows one step commands consistently;Follows multi-step commands inconsistently     Problem Solving: Decreased initiation;Difficulty sequencing;Requires verbal cues;Requires tactile cues      Exercises      General Comments General comments (skin integrity, edema, etc.): PT returned to assist pt back to bed and on bed pan due to pt requiring maxAx2 for mobility.      Pertinent Vitals/Pain Pain Assessment: No/denies pain    Home Living                      Prior Function            PT Goals (current goals can now be  found in the care plan section) Acute Rehab PT Goals Patient Stated Goal: none stated Progress towards PT goals: Progressing toward goals    Frequency  Min 4X/week    PT Plan Current plan remains appropriate    Co-evaluation             End of Session Equipment Utilized During Treatment: Gait  belt Activity Tolerance: Patient limited by fatigue Patient left: with call bell/phone within reach;with family/visitor present;in bed;with nursing/sitter in room     Time: 1328-1403 PT Time Calculation (min) (ACUTE ONLY): 35 min  Charges:  $Gait Training: 8-22 mins $Therapeutic Activity: 8-22 mins                    G Codes:      Renaee Munda 06-Jun-2015, 3:29 PM  Lequita Halt (student physical therapy assistant) Acute Rehab [phone]

## 2015-05-09 NOTE — Clinical Social Work Note (Signed)
Clinical Social Worker facilitated patient discharge including contacting patient family and facility to confirm patient discharge plans.  Clinical information faxed to facility and family agreeable with plan. Patient's family planning to transport patient to [place].  RN to call report prior to discharge.  DC packet prepared and on chart with number for report. BLUE Medicare authorization obtained [id number].  Clinical Social Worker will sign off for now as social work intervention is no longer needed. Please consult [place] again if new need arises.  Glendon Axe, MSW, LCSWA [phone] 05/09/2015 1:04 PM

## 2015-05-09 NOTE — Clinical Social Work Note (Signed)
Patient will now be transported by [facility].  CSW arranged ambulance transport via [facility] to [place].    Clinical Social Worker will sign off for now as social work intervention is no longer needed. Please consult [place] again if new need arises.  Glendon Axe, MSW, LCSWA [phone] 05/09/2015 2:23 PM

## 2015-05-09 NOTE — Progress Notes (Signed)
I met with pt at bedside with her daughter, Jenny Reichmann. An inpt rehab bed is not available for this pt's admission today. Family then prefers admission to [facility] rather than other inpt rehab facilities. I discussed with Dr. Eliseo Squires and SW. [phone]

## 2015-05-09 NOTE — Clinical Social Work Placement (Signed)
   CLINICAL SOCIAL WORK PLACEMENT  NOTE  Date:  05/09/2015  Patient Details  Name: Cathy Taylor MRN: [id number] Date of Birth: 1933/09/10  Clinical Social Work is seeking post-discharge placement for this patient at the [place] level of care (*CSW will initial, date and re-position this form in  chart as items are completed):  Yes   Patient/family provided with [place] Work Department's list of facilities offering this level of care within the geographic area requested by the patient (or if unable, by the patient's family).  Yes   Patient/family informed of their freedom to choose among providers that offer the needed level of care, that participate in Medicare, Medicaid or managed care program needed by the patient, have an available bed and are willing to accept the patient.  Yes   Patient/family informed of [facility]'s ownership interest in [facility] and [facility], as well as of the fact that they are under no obligation to receive care at these facilities.  PASRR submitted to EDS on 05/08/15     PASRR number received on 05/08/15     Existing PASRR number confirmed on       FL2 transmitted to all facilities in geographic area requested by pt/family on 05/08/15     FL2 transmitted to all facilities within larger geographic area on 05/08/15 ([place], [place] and [place])     Patient informed that his/her managed care company has contracts with or will negotiate with certain facilities, including the following:   [facility] Medicare)     Yes   Patient/family informed of bed offers received.  Patient chooses bed at  ([facility] )     Physician recommends and patient chooses bed at      Patient to be transferred to  ([place] ) on 05/09/15.  Patient to be transferred to facility by  (Pt will be transported by dtr, Jenny Reichmann)     Patient family notified on 05/09/15 of  transfer.  Name of family member notified:   (Pt's dtrs, Jenny Reichmann and [place] )     PHYSICIAN Please prepare priority discharge summary, including medications, Please sign FL2, Please sign DNR, Please prepare prescriptions     Additional Comment:    _______________________________________________ Glendon Axe, MSW, [place] [phone] 05/09/2015 1:03 PM

## 2015-05-09 NOTE — Clinical Social Work Note (Signed)
Family chooses bed at [facility].  CSW has initiated [facility] with [employer].   Glendon Axe, MSW, LCSWA [phone] 05/09/2015 10:50 AM

## 2015-05-10 NOTE — Telephone Encounter (Signed)
Left message to call back  

## 2015-05-11 NOTE — Telephone Encounter (Signed)
Patient in nursing home

## 2015-05-18 ENCOUNTER — Encounter: Payer: Medicare Other | Admitting: Nurse Practitioner

## 2015-05-23 ENCOUNTER — Ambulatory Visit (INDEPENDENT_AMBULATORY_CARE_PROVIDER_SITE_OTHER): Payer: Medicare Other | Admitting: Physician Assistant

## 2015-05-23 ENCOUNTER — Encounter: Payer: Self-pay | Admitting: Physician Assistant

## 2015-05-23 ENCOUNTER — Ambulatory Visit (INDEPENDENT_AMBULATORY_CARE_PROVIDER_SITE_OTHER): Payer: Medicare Other | Admitting: *Deleted

## 2015-05-23 VITALS — BP 128/62 | HR 76 | Wt 151.4 lb

## 2015-05-23 DIAGNOSIS — I429 Cardiomyopathy, unspecified: Secondary | ICD-10-CM

## 2015-05-23 DIAGNOSIS — I639 Cerebral infarction, unspecified: Secondary | ICD-10-CM

## 2015-05-23 LAB — CUP PACEART INCLINIC DEVICE CHECK
Date Time Interrogation Session: 20160831113920
MDC IDC SET ZONE DETECTION INTERVAL: 3000 ms
Zone Setting Detection Interval: 2000 ms
Zone Setting Detection Interval: 400 ms

## 2015-05-23 MED ORDER — LISINOPRIL 5 MG PO TABS: 5.0000 mg | ORAL_TABLET | Freq: Every day | ORAL | Status: DC

## 2015-05-23 NOTE — Progress Notes (Addendum)
Cardiology Office Note   Date:  05/23/2015   ID:  Cathy Taylor, DOB 1933-05-10, MRN [id number]  PCP:  Kandice Hams, MD  Cardiologist:  Dr Oscar La, PA-C   No chief complaint on file.   History of Present Illness: Cathy Taylor is a 81 y.o. female with a history of diabetes, hypertension, PMR and breast cancer. She was admitted for a CVA and had a loop recorder implanted as well as suspected ischemic cardiomyopathy with an EF of 30-35 percent and peak troponin of 1.46. She was discharged on 08/17 to a skilled nursing facility.  Cathy Taylor presents for post hospital follow-up.  She is participating in 3 hours of therapies daily at the facility. Her appetite is poor and according to her daughter, she eats very little. She is eating soft foods such as Jell-O and drinking some liquids. The patient states she just doesn't want to eat and has no appetite.  She denies dyspnea on exertion, orthopnea, PND, and lower extremity edema. She has not had chest pain in greater than 5 years. She has not had palpitations. She is improving with physical therapy, but it is slow. Her right side is still very weak and she cannot stand or walk without assistance.   Past Medical History  Diagnosis Date  . Stress fracture of left femur with nonunion 07/08/2011  . PMR (polymyalgia rheumatica)   . GERD (gastroesophageal reflux disease)   . Anxiety   . Hypertension   . Abdominal hernia   . Hypercholesteremia   . Heart murmur   . Chronic back pain greater than 3 months duration   . Bronchiectasis 11/12  . 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 Depression   . Bronchitis        . Type II diabetes mellitus   . Umbilical hernia 9/38/18    unrepaired  . Breast cancer 1973    right  . Arthritis   . Osteoarthritis of left shoulder 01/06/2012  . Stroke   . Left ventricular dysfunction 04/2015    EF 30% by TEE, + AK mid-apical anteroseptal and anterior walls plus apex, neg bubble study, grade  III plaque asc Ao, suspect ICM    Past Surgical History  Procedure Laterality Date  . Tubal ligation    . Cataract extraction w/ intraocular lens implant  07/2011    left  . Shoulder arthroscopy  06/2000    left  . Femur surgery  07/07/2011    left rod placed  . Total shoulder arthroplasty  01/06/12    left  . Breast biopsy  1981    left  . Mastectomy  1973    right  . Total shoulder arthroplasty  01/06/2012    Procedure: TOTAL SHOULDER ARTHROPLASTY;  Surgeon: Johnny Bridge, MD;  Location: [facility];  Service: Orthopedics;  Laterality: Left;  . Ep implantable device N/A 05/08/2015    Procedure: Loop Recorder Insertion;  Surgeon: Will Meredith Leeds, MD;  Location: [facility];  Service: Cardiovascular;  Laterality: N/A;  . Tee without cardioversion N/A 05/07/2015    Procedure: TRANSESOPHAGEAL ECHOCARDIOGRAM (TEE);  Surgeon: Larey Dresser, MD;  EF 30%, +WMA, no thrombus/PFO, suspect ICM      Current Outpatient Prescriptions  Medication Sig Dispense Refill  . acetaminophen (TYLENOL) 500 MG tablet Take 500 mg by mouth every 6 (six) hours as needed. For pain     . albuterol (PROVENTIL HFA;VENTOLIN HFA) 108 (90 BASE) MCG/ACT inhaler  Inhale 2 puffs into the lungs every 6 (six) hours as needed. Shortness of breath    . aspirin 325 MG tablet Take 1 tablet (325 mg total) by mouth daily.    Marland Kitchen atorvastatin (LIPITOR) 80 MG tablet Take 1 tablet (80 mg total) by mouth daily at 6 PM.    . carvedilol (COREG) 6.25 MG tablet Take 1 tablet (6.25 mg total) by mouth 2 (two) times daily with a meal.    . diclofenac sodium (VOLTAREN) 1 % GEL Apply 1 application topically 4 (four) times daily as needed. Pain in shoulder    . DULoxetine (CYMBALTA) 30 MG capsule Take 30 mg by mouth daily.      Marland Kitchen esomeprazole (NEXIUM) 20 MG capsule Take 20 mg by mouth 2 (two) times daily before a meal.    . glimepiride (AMARYL) 1 MG tablet Take 1 mg by mouth daily.      Marland Kitchen lisinopril (PRINIVIL,ZESTRIL) 2.5 MG tablet Take  1 tablet (2.5 mg total) by mouth daily.    . metFORMIN (GLUCOPHAGE) 500 MG tablet Take 1,000 mg by mouth 2 (two) times daily. [employer] [facility] papers states 1000 mg tabs twice a day.. Pt states pill are 500 mg 2 in am 1 in pm     No current facility-administered medications for this visit.    Allergies:   Review of patient's allergies indicates no known allergies.    Social History:  The patient  reports that she has never smoked. She quit smokeless tobacco use about 66 years ago. Her smokeless tobacco use included Snuff. She reports that she does not drink alcohol or use illicit drugs.   Family History:  The patient's family history includes Heart attack in her father; Stroke in her maternal aunt and son.   ROS:  Please see the history of present illness. All other systems are reviewed and negative.   PHYSICAL EXAM: VS:  BP 128/62 mmHg  Pulse 76  Wt 151 lb 6.4 oz (68.675 kg) , BMI Body mass index is 26.83 kg/(m^2). GEN: Well nourished, well developed, elderly female in no acute distress HEENT: normal Neck: no JVD, carotid bruits, or masses Cardiac: RRR; no murmurs, rubs, or gallops,no edema  Respiratory: Few rales bases, clears with deep inspiration, normal work of breathing GI: soft, nontender, nondistended, + BS MS: no deformity or atrophy Skin: warm and dry, no rash; no extremities have dry skin and mild erythema, no lesions noted Neuro:  Strength and sensation are intact Psych: euthymic mood, full affect   EKG:  EKG is not ordered today.  ECHO: 05/05/2015 - Left ventricle: The cavity size was normal. Wall thickness was normal. Systolic function was moderately to severely reduced. The estimated ejection fraction was in the range of 30% to 35%. Akinesis of the mid-apicalanteroseptal and apical myocardium. Doppler parameters are consistent with abnormal left ventricular relaxation (grade 1 diastolic dysfunction). - Pulmonary arteries: Systolic pressure was  moderately increased. PA peak pressure: 43 mm Hg (S).  TEE: 05/07/2015 Findings: Please see echo section for full report. Normal LV size with mild focal basal septal hypertrophy. EF 30% with akinesis of the mid to apical anteroseptal and anterior walls and the true apex. No LV thrombus was visualized. Normal RV size and systolic function. There was a prominent RV papillary muscle. Trivial MR. Trivial TR. Trileaflet aortic valve with no AI and no AS. Mild left atrial enlargement, normal right atrial size. No LAA thrombus. No PFO/ASD (negative bubble study). Lipomatous atrial septal hypertrophy. There was grade  III plaque in the descending thoracic aorta.  Suspect ischemic cardiomyopathy. No source of embolus noted.   Recent Labs: 05/04/2015: ALT 14; TSH 1.475 05/05/2015: BUN 15; Creatinine, Ser 0.86; Hemoglobin 12.8; Platelets 276; Potassium 4.2; Sodium 138    Lipid Panel    Component Value Date/Time   CHOL 162 05/05/2015 0224   TRIG 147 05/05/2015 0224   HDL 45 05/05/2015 0224   CHOLHDL 3.6 05/05/2015 0224   VLDL 29 05/05/2015 0224   LDLCALC 88 05/05/2015 0224     Wt Readings from Last 3 Encounters:  05/23/15 151 lb 6.4 oz (68.675 kg)  05/04/15 170 lb 3.1 oz (77.2 kg)  11/13/14 161 lb (73.029 kg)     Other studies Reviewed: Additional studies/ records that were reviewed today include: Hospital records, echo, TEE, loop report.  ASSESSMENT AND PLAN:  1.  Left ventricular dysfunction: She has presumed ischemic cardiomyopathy with wall motion abnormalities and no history of LVD. She has no signs or symptoms of volume overload. She is not currently on a diuretic. We will add daily weights, and make sure she is on a low-sodium diet. Her daughter is aware that after the patient is discharged from the facility, she will need to continue to check daily weights and watch for edema.  The possibility of coronary artery disease as a cause for her left ventricular dysfunction was  discussed with the patient and her daughter. Advised them that she would need to return once she is more recovered from her CVA and get a follow-up echocardiogram at that time. She is to see Dr. Meda Coffee after the echocardiogram and an ischemic evaluation can be decided on at that time.  2. CVA: She had a loop recorder implanted because of this while in the hospital. It was interrogated today and there are no episodes. Her incision is healing well with no signs of infection. Continue to follow for arrhythmia.  Current medicines are reviewed at length with the patient today.  The patient does not have concerns regarding medicines.  The following changes have been made:  Increase lisinopril to 5 mg daily  Labs/ tests ordered today include:   2-D echocardiogram   Disposition:   FU with Dr. Tamala Julian in 2 months with the echocardiogram  Signed, Lenoard Aden  05/23/2015 12:06 PM    [address], [place], [place]  [address] Phone: [phone]; Fax: [phone]

## 2015-05-23 NOTE — Patient Instructions (Addendum)
Medication Instructions:  Your physician has recommended you make the following change in your medication:  1) INCREASE Lisinopril to 5mg  tablet by mouth ONCE daily 2) DRINK Boost Supplemental Drink TWICE daily  Labwork: NONE  Testing/Procedures: Your physician has requested that you have an echocardiogram. Echocardiography is a painless test that uses sound waves to create images of your heart. It provides your doctor with information about the size and shape of your heart and how well your heart's chambers and valves are working. This procedure takes approximately one hour. There are no restrictions for this procedure. SCHEDULED SAME DAY BEFORE APPT WITH DR. Meda Coffee    Follow-Up: Your physician recommends that you schedule a follow-up appointment in: 2 [place]   Any Other Special Instructions Will Be Listed Below (If Applicable).  Low-Sodium Eating Plan Sodium raises blood pressure and causes water to be held in the body. Getting less sodium from food will help lower your blood pressure, reduce any swelling, and protect your heart, liver, and kidneys. We get sodium by adding salt (sodium chloride) to food. Most of our sodium comes from canned, boxed, and frozen foods. Restaurant foods, fast foods, and pizza are also very high in sodium. Even if you take medicine to lower your blood pressure or to reduce fluid in your body, getting less sodium from your food is important. WHAT IS MY PLAN? Most people should limit their sodium intake to 2,300 mg a day. Your health care provider recommends that you limit your sodium intake to __________ a day.  WHAT DO I NEED TO KNOW ABOUT THIS EATING PLAN? For the low-sodium eating plan, you will follow these general guidelines:  Choose foods with a % Daily Value for sodium of less than 5% (as listed on the food label).   Use salt-free seasonings or herbs instead of table salt or sea salt.   Check with your health care provider or  pharmacist before using salt substitutes.   Eat fresh foods.  Eat more vegetables and fruits.  Limit canned vegetables. If you do use them, rinse them well to decrease the sodium.   Limit cheese to 1 oz (28 g) per day.   Eat lower-sodium products, often labeled as "lower sodium" or "no salt added."  Avoid foods that contain monosodium glutamate (MSG). MSG is sometimes added to [place] food and some canned foods.  Check food labels (Nutrition Facts labels) on foods to learn how much sodium is in one serving.  Eat more home-cooked food and less restaurant, buffet, and fast food.  When eating at a restaurant, ask that your food be prepared with less salt or none, if possible.  HOW DO I READ FOOD LABELS FOR SODIUM INFORMATION? The Nutrition Facts label lists the amount of sodium in one serving of the food. If you eat more than one serving, you must multiply the listed amount of sodium by the number of servings. Food labels may also identify foods as:  Sodium free--Less than 5 mg in a serving.  Very low sodium--35 mg or less in a serving.  Low sodium--140 mg or less in a serving.  Light in sodium--50% less sodium in a serving. For example, if a food that usually has 300 mg of sodium is changed to become light in sodium, it will have 150 mg of sodium.  Reduced sodium--25% less sodium in a serving. For example, if a food that usually has 400 mg of sodium is changed to reduced sodium, it will have 300  mg of sodium. WHAT FOODS CAN I EAT? Grains Low-sodium cereals, including oats, puffed wheat and rice, and shredded wheat cereals. Low-sodium crackers. Unsalted rice and pasta. Lower-sodium bread.  Vegetables Frozen or fresh vegetables. Low-sodium or reduced-sodium canned vegetables. Low-sodium or reduced-sodium tomato sauce and paste. Low-sodium or reduced-sodium tomato and vegetable juices.  Fruits Fresh, frozen, and canned fruit. Fruit juice.  Meat and Other Protein  Products Low-sodium canned tuna and salmon. Fresh or frozen meat, poultry, seafood, and fish. Lamb. Unsalted nuts. Dried beans, peas, and lentils without added salt. Unsalted canned beans. Homemade soups without salt. Eggs.  Dairy Milk. Soy milk. Ricotta cheese. Low-sodium or reduced-sodium cheeses. Yogurt.  Condiments Fresh and dried herbs and spices. Salt-free seasonings. Onion and garlic powders. Low-sodium varieties of mustard and ketchup. Lemon juice.  Fats and Oils Reduced-sodium salad dressings. Unsalted butter.  Other Unsalted popcorn and pretzels.  The items listed above may not be a complete list of recommended foods or beverages. Contact your dietitian for more options. WHAT FOODS ARE NOT RECOMMENDED? Grains Instant hot cereals. Bread stuffing, pancake, and biscuit mixes. Croutons. Seasoned rice or pasta mixes. Noodle soup cups. Boxed or frozen macaroni and cheese. Self-rising flour. Regular salted crackers. Vegetables Regular canned vegetables. Regular canned tomato sauce and paste. Regular tomato and vegetable juices. Frozen vegetables in sauces. Salted french fries. Olives. Angie Fava. Relishes. Sauerkraut. Salsa. Meat and Other Protein Products Salted, canned, smoked, spiced, or pickled meats, seafood, or fish. Bacon, ham, sausage, hot dogs, corned beef, chipped beef, and packaged luncheon meats. Salt pork. Jerky. Pickled herring. Anchovies, regular canned tuna, and sardines. Salted nuts. Dairy Processed cheese and cheese spreads. Cheese curds. Blue cheese and cottage cheese. Buttermilk.  Condiments Onion and garlic salt, seasoned salt, table salt, and sea salt. Canned and packaged gravies. Worcestershire sauce. Tartar sauce. Barbecue sauce. Teriyaki sauce. Soy sauce, including reduced sodium. Steak sauce. Fish sauce. Oyster sauce. Cocktail sauce. Horseradish. Regular ketchup and mustard. Meat flavorings and tenderizers. Bouillon cubes. Hot sauce. Tabasco sauce.  Marinades. Taco seasonings. Relishes. Fats and Oils Regular salad dressings. Salted butter. Margarine. Ghee. Bacon fat.  Other Potato and tortilla chips. Corn chips and puffs. Salted popcorn and pretzels. Canned or dried soups. Pizza. Frozen entrees and pot pies.  The items listed above may not be a complete list of foods and beverages to avoid. Contact your dietitian for more information. Document Released: 02/28/2002 Document Revised: 09/13/2013 Document Reviewed: 07/13/2013 [facility] Patient Information 2015 [place], [place]. This information is not intended to replace advice given to you by your health care provider. Make sure you discuss any questions you have with your health care provider.

## 2015-05-23 NOTE — Progress Notes (Signed)
Loop check in clinic. Wound edges approximated, no redness, swelling or drainage. Pt educated about wound care. Battery- good. R waves 0.95mV. Pt with no episodes. Monthly carelink summary reports, ROV with JA PRN.

## 2015-06-07 ENCOUNTER — Ambulatory Visit (INDEPENDENT_AMBULATORY_CARE_PROVIDER_SITE_OTHER): Payer: Medicare Other | Admitting: *Deleted

## 2015-06-07 ENCOUNTER — Encounter: Payer: Self-pay | Admitting: Internal Medicine

## 2015-06-07 DIAGNOSIS — I639 Cerebral infarction, unspecified: Secondary | ICD-10-CM | POA: Diagnosis not present

## 2015-06-07 NOTE — Progress Notes (Signed)
Loop recorder 

## 2015-06-13 ENCOUNTER — Ambulatory Visit: Payer: Self-pay | Admitting: Neurology

## 2015-06-14 LAB — CUP PACEART REMOTE DEVICE CHECK: Date Time Interrogation Session: 20160915110514

## 2015-06-14 NOTE — Progress Notes (Signed)
[facility] summary report received. Battery status OK. Normal device function. No new symptom episodes, tachy episodes, brady, or pause episodes. No new AF episodes. Monthly summary reports and ROV with JA PRN. 

## 2015-06-22 ENCOUNTER — Ambulatory Visit: Payer: Self-pay | Admitting: Neurology

## 2015-06-28 ENCOUNTER — Ambulatory Visit: Payer: Self-pay | Admitting: Neurology

## 2015-06-28 ENCOUNTER — Encounter: Payer: Self-pay | Admitting: Internal Medicine

## 2015-07-02 ENCOUNTER — Encounter: Payer: Self-pay | Admitting: Neurology

## 2015-07-02 ENCOUNTER — Ambulatory Visit (INDEPENDENT_AMBULATORY_CARE_PROVIDER_SITE_OTHER): Payer: Medicare Other | Admitting: Neurology

## 2015-07-02 VITALS — BP 157/79 | HR 71 | Ht 64.0 in | Wt 146.0 lb

## 2015-07-02 DIAGNOSIS — I639 Cerebral infarction, unspecified: Secondary | ICD-10-CM

## 2015-07-02 MED ORDER — DULOXETINE HCL 60 MG PO CPEP: 60.0000 mg | ORAL_CAPSULE | Freq: Every day | ORAL | Status: DC

## 2015-07-02 NOTE — Patient Instructions (Signed)
I had a long d/w patient, daughter and son in law about his recent stroke, risk for recurrent stroke/TIAs, personally independently reviewed imaging studies and stroke evaluation results and answered questions.Continue aspirin 325 mg orally every day  for secondary stroke prevention and maintain strict control of hypertension with blood pressure goal below 130/90, diabetes with hemoglobin A1c goal below 6.5% and lipids with LDL cholesterol goal below 100 mg/dL. I also advised the patient to eat a healthy diet with plenty of whole grains, cereals, fruits and vegetables, exercise regularly and maintain ideal body weight .I recommend the patient increase Cymbalta dose to 60 mg daily to help her post stroke depression and improve motivation. We also discussed possible side effects and advised the patient to see treatment for nausea with primary physician and upcoming visit this this week. He also discussed fall prevention precautions and encouraged the patient to ambulate with a walker and 1 person assist. We also talked about possibly moving into assisted living situation Followup in the future with me in 6 months or call earlier if necessary. Stroke Prevention Some medical conditions and behaviors are associated with an increased chance of having a stroke. You may prevent a stroke by making healthy choices and managing medical conditions. HOW CAN I REDUCE MY RISK OF HAVING A STROKE?   Stay physically active. Get at least 30 minutes of activity on most or all days.  Do not smoke. It may also be helpful to avoid exposure to secondhand smoke.  Limit alcohol use. Moderate alcohol use is considered to be:  No more than 2 drinks per day for men.  No more than 1 drink per day for nonpregnant women.  Eat healthy foods. This involves:  Eating 5 or more servings of fruits and vegetables a day.  Making dietary changes that address high blood pressure (hypertension), high cholesterol, diabetes, or  obesity.  Manage your cholesterol levels.  Making food choices that are high in fiber and low in saturated fat, trans fat, and cholesterol may control cholesterol levels.  Take any prescribed medicines to control cholesterol as directed by your health care provider.  Manage your diabetes.  Controlling your carbohydrate and sugar intake is recommended to manage diabetes.  Take any prescribed medicines to control diabetes as directed by your health care provider.  Control your hypertension.  Making food choices that are low in salt (sodium), saturated fat, trans fat, and cholesterol is recommended to manage hypertension.  Ask your health care provider if you need treatment to lower your blood pressure. Take any prescribed medicines to control hypertension as directed by your health care provider.  If you are 24-31 years of age, have your blood pressure checked every 3-5 years. If you are 35 years of age or older, have your blood pressure checked every year.  Maintain a healthy weight.  Reducing calorie intake and making food choices that are low in sodium, saturated fat, trans fat, and cholesterol are recommended to manage weight.  Stop drug abuse.  Avoid taking birth control pills.  Talk to your health care provider about the risks of taking birth control pills if you are over 59 years old, smoke, get migraines, or have ever had a blood clot.  Get evaluated for sleep disorders (sleep apnea).  Talk to your health care provider about getting a sleep evaluation if you snore a lot or have excessive sleepiness.  Take medicines only as directed by your health care provider.  For some people, aspirin or blood thinners (anticoagulants)  are helpful in reducing the risk of forming abnormal blood clots that can lead to stroke. If you have the irregular heart rhythm of atrial fibrillation, you should be on a blood thinner unless there is a good reason you cannot take them.  Understand all  your medicine instructions.  Make sure that other conditions (such as anemia or atherosclerosis) are addressed. SEEK IMMEDIATE MEDICAL CARE IF:   You have sudden weakness or numbness of the face, arm, or leg, especially on one side of the body.  Your face or eyelid droops to one side.  You have sudden confusion.  You have trouble speaking (aphasia) or understanding.  You have sudden trouble seeing in one or both eyes.  You have sudden trouble walking.  You have dizziness.  You have a loss of balance or coordination.  You have a sudden, severe headache with no known cause.  You have new chest pain or an irregular heartbeat. Any of these symptoms may represent a serious problem that is an emergency. Do not wait to see if the symptoms will go away. Get medical help at once. Call your local emergency services (911 in [place] Do not drive yourself to the hospital.   This information is not intended to replace advice given to you by your health care provider. Make sure you discuss any questions you have with your health care provider.   Document Released: 10/16/2004 Document Revised: 09/29/2014 Document Reviewed: 03/11/2013 Elsevier Interactive Patient Education 2016 [place] in the Home  Falls can cause injuries. They can happen to people of all ages. There are many things you can do to make your home safe and to help prevent falls.  WHAT CAN I DO ON THE OUTSIDE OF MY HOME?  Regularly fix the edges of walkways and driveways and fix any cracks.  Remove anything that might make you trip as you walk through a door, such as a raised step or threshold.  Trim any bushes or trees on the path to your home.  Use bright outdoor lighting.  Clear any walking paths of anything that might make someone trip, such as rocks or tools.  Regularly check to see if handrails are loose or broken. Make sure that both sides of any steps have handrails.  Any raised decks and  porches should have guardrails on the edges.  Have any leaves, snow, or ice cleared regularly.  Use sand or salt on walking paths during winter.  Clean up any spills in your garage right away. This includes oil or grease spills. WHAT CAN I DO IN THE BATHROOM?   Use night lights.  Install grab bars by the toilet and in the tub and shower. Do not use towel bars as grab bars.  Use non-skid mats or decals in the tub or shower.  If you need to sit down in the shower, use a plastic, non-slip stool.  Keep the floor dry. Clean up any water that spills on the floor as soon as it happens.  Remove soap buildup in the tub or shower regularly.  Attach bath mats securely with double-sided non-slip rug tape.  Do not have throw rugs and other things on the floor that can make you trip. WHAT CAN I DO IN THE BEDROOM?  Use night lights.  Make sure that you have a light by your bed that is easy to reach.  Do not use any sheets or blankets that are too big for your bed. They should not hang  down onto the floor.  Have a firm chair that has side arms. You can use this for support while you get dressed.  Do not have throw rugs and other things on the floor that can make you trip. WHAT CAN I DO IN THE KITCHEN?  Clean up any spills right away.  Avoid walking on wet floors.  Keep items that you use a lot in easy-to-reach places.  If you need to reach something above you, use a strong step stool that has a grab bar.  Keep electrical cords out of the way.  Do not use floor polish or wax that makes floors slippery. If you must use wax, use non-skid floor wax.  Do not have throw rugs and other things on the floor that can make you trip. WHAT CAN I DO WITH MY STAIRS?  Do not leave any items on the stairs.  Make sure that there are handrails on both sides of the stairs and use them. Fix handrails that are broken or loose. Make sure that handrails are as long as the stairways.  Check any  carpeting to make sure that it is firmly attached to the stairs. Fix any carpet that is loose or worn.  Avoid having throw rugs at the top or bottom of the stairs. If you do have throw rugs, attach them to the floor with carpet tape.  Make sure that you have a light switch at the top of the stairs and the bottom of the stairs. If you do not have them, ask someone to add them for you. WHAT ELSE CAN I DO TO HELP PREVENT FALLS?  Wear shoes that:  Do not have high heels.  Have rubber bottoms.  Are comfortable and fit you well.  Are closed at the toe. Do not wear sandals.  If you use a stepladder:  Make sure that it is fully opened. Do not climb a closed stepladder.  Make sure that both sides of the stepladder are locked into place.  Ask someone to hold it for you, if possible.  Clearly mark and make sure that you can see:  Any grab bars or handrails.  First and last steps.  Where the edge of each step is.  Use tools that help you move around (mobility aids) if they are needed. These include:  Canes.  Walkers.  Scooters.  Crutches.  Turn on the lights when you go into a dark area. Replace any light bulbs as soon as they burn out.  Set up your furniture so you have a clear path. Avoid moving your furniture around.  If any of your floors are uneven, fix them.  If there are any pets around you, be aware of where they are.  Review your medicines with your doctor. Some medicines can make you feel dizzy. This can increase your chance of falling. Ask your doctor what other things that you can do to help prevent falls.   This information is not intended to replace advice given to you by your health care provider. Make sure you discuss any questions you have with your health care provider.   Document Released: 07/05/2009 Document Revised: 01/23/2015 Document Reviewed: 10/13/2014 Elsevier Interactive Patient Education [employer].

## 2015-07-02 NOTE — Progress Notes (Signed)
Patient arrive at 0945 for her 0930 appt. Pts daughter Ivin Booty stated that the nursing home stated pts appt is at 10:00am and to arrive at 0945am. Pts daughter was told it was 0930am. Rn ask Dr.Sethi if he would see patient and he stated yes. MD had a research pt at 1000 and a no show earlier.Patients daughter stated it would be better to have  [name] number down for appts and test results. Pt had a stroke and her husband had medical problems. Pt has a vm at home on phone but sometimes its not check. Rn agreed to put down [name](pts daughter) as first contact for future appts. Rn explain to patients daughter Ivin Booty and son in law that pt will receive a automatic , and live phone call to remind of future appts. The family understood and agreed to coming 15 to 20 min prior. The nurse explain if the md did not have a cancellation the pt would have to reschedule.

## 2015-07-02 NOTE — Progress Notes (Signed)
[facility] [address] [place]. [place] [address] [phone]       OFFICE FOLLOW-UP NOTE  Ms. [name] Date of Birth:  [birth year] Medical Record Number:  [id number]   HPI: [name] is seen today for first office follow-up visit following admission to [facility] in August 2016 with stroke. Vivien Barretto is an 81 y.o. female with a istory of hypertension, hyperlipidemia, diabetes mellitus, breast cancer and polymyalgia rheumatica, who presented with vertigo with nausea she was also noted to have slurred speech and confusion and was sent to the emergency room for evaluation from her Primary care physician's office. No focal weakness was noted. She did not experience diplopia. CT scan of her head showed no acute intracranial abnormality. She was noted to have pinpoint pupils on arrival in the emergency room. Urine drug screen was negativeg. Patient has not been on antiplatelets therapy. She had no history of stroke nor TIA. MRI of the brain showed a large acute right cerebellar ischemic infarction. LSN: 05/03/2015 tPA Given: No: Beyond time window for treatment consideration. She was admitted to the neurological unit where she remained stable. MRI scan of the brain showed an acute large superior cerebellar artery infarct on the right side. There is no hydrocephalus. MRA showed occlusion of the right superior cerebellar artery proximally. CT angiogram showed 75% stenosis of the right ICA with dense atheromatous calcification in less than 50% stenosis of left ICA. Carotid ultrasound showed only however 40-59% ICA stenosis bilaterally with the acoustic shadowing due to calcific plaques which may obscure higher velocities. CT angiogram hence was obtained which confirm 75% right ICA stenosis which was asymptomatic. LDL cholesterol was elevated at 88 mg percent. Transthoracic echo showed ejection fraction of 30-35% but no cardiac source of emboli. Transesophageal echocardiogram  confirmed low cardiac ejection fraction but showed no cardiac source of embolism. Hemoglobin A1c was 6.6. Patient had loop recorder inserted for paroxysmal A. fib and so far negative has not had been found. Patient was transferred to skilled nursing facility for inpatient rehabilitation and made gradual improvement and is currently at home for the last 1 week. She is able to walk with a walker but needs a one-person assist. She has lack of motivation and has not been eating well due to decreased appetite and nausea. Patient is also under a lot of stress, husband is not well. She does take Cymbalta 30 mg daily but has not yet tried a higher dose. The family feels that is not helping. She still has some dysarthria and right hemiparesis and dysarthria seems to get worse in the afternoon when she is tired. ROS:   14 system review of systems is positive for shortness of breath, blurred vision, joint pain, weakness, sleepiness, lack of motivation, nausea, decreased appetite and all other systems negative  PMH:  Past Medical History  Diagnosis Date  . Stress fracture of left femur with nonunion 07/08/2011  . PMR (polymyalgia rheumatica) (HCC)   . GERD (gastroesophageal reflux disease)   . Anxiety   . Hypertension   . Abdominal hernia   . Hypercholesteremia   . Heart murmur   . Chronic back pain greater than 3 months duration   . Bronchiectasis 11/12  . 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 Depression   . Bronchitis        . Type II diabetes mellitus ([place])   . Umbilical hernia 9/35/70    unrepaired  . Breast cancer ([place]) 1973  right  . Arthritis   . Osteoarthritis of left shoulder 01/06/2012  . Stroke ([place])   . Left ventricular dysfunction 04/2015    EF 30% by TEE, + AK mid-apical anteroseptal and anterior walls plus apex, neg bubble study, grade III plaque asc Ao, suspect ICM    Social History:  Social History   Social History  . Marital Status: Married    Spouse Name: N/A  . Number of  Children: N/A  . Years of Education: N/A   Occupational History  . Not on file.   Social History Main Topics  . Smoking status: Never Smoker   . Smokeless tobacco: Former Systems developer    Types: Snuff    Quit date: 09/22/1948     Comment: "just tried smoking; didn't smoke to amount to nothing"  . Alcohol Use: No  . Drug Use: No  . Sexual Activity: Not Currently   Other Topics Concern  . Not on file   Social History Narrative   Currently lives with her husband who has Parkinson's Disease and Dementia. Their children live nearby. They have home health caregivers who assist them throughout the week as well.    Medications:   Current Outpatient Prescriptions on File Prior to Visit  Medication Sig Dispense Refill  . acetaminophen (TYLENOL) 500 MG tablet Take 500 mg by mouth every 6 (six) hours as needed. For pain     . albuterol (PROVENTIL HFA;VENTOLIN HFA) 108 (90 BASE) MCG/ACT inhaler Inhale 2 puffs into the lungs every 6 (six) hours as needed. Shortness of breath    . aspirin 325 MG tablet Take 1 tablet (325 mg total) by mouth daily.    Marland Kitchen atorvastatin (LIPITOR) 80 MG tablet Take 1 tablet (80 mg total) by mouth daily at 6 PM.    . carvedilol (COREG) 6.25 MG tablet Take 1 tablet (6.25 mg total) by mouth 2 (two) times daily with a meal.    . diclofenac sodium (VOLTAREN) 1 % GEL Apply 1 application topically 4 (four) times daily as needed. Pain in shoulder    . esomeprazole (NEXIUM) 20 MG capsule Take 20 mg by mouth 2 (two) times daily before a meal.    . glimepiride (AMARYL) 1 MG tablet Take 1 mg by mouth daily.      Marland Kitchen lisinopril (PRINIVIL,ZESTRIL) 5 MG tablet Take 1 tablet (5 mg total) by mouth daily. 90 tablet 3  . metFORMIN (GLUCOPHAGE) 500 MG tablet Take 1,000 mg by mouth 2 (two) times daily. [employer] papers states 1000 mg tabs twice a day.. Pt states pill are 500 mg 2 in am 1 in pm     No current facility-administered medications on file prior to visit.    Allergies:  No  Known Allergies  Physical Exam General: Frail elderly Caucasian lady, seated, in no evident distress Head: head normocephalic and atraumatic.  Neck: supple with no carotid or supraclavicular bruits Cardiovascular: regular rate and rhythm, no murmurs Musculoskeletal: no deformity Skin:  no rash/petichiae Vascular:  Normal pulses all extremities Filed Vitals:   07/02/15 1014  BP: 157/79  Pulse: 71   Neurologic Exam Mental Status: Awake and fully alert. Oriented to place and time. Recent and remote memory intact. Attention span, concentration and fund of knowledge appropriate. Mood and affect appear depressed. Mild dysarthria. No aphasia.  Cranial Nerves: Fundoscopic exam reveals sharp disc margins. Pupils equal, briskly reactive to light. Extraocular movements full without nystagmus. Visual fields full to confrontation. Hearing intact. Facial sensation intact. Face, tongue, palate  moves normally and symmetrically.  Motor: Mild right hemiparesis with right upper extremity drift. Weakness of right grip and intrinsic hand muscles. Mild right foot drop. Mild weakness of right hip flexors and ankle dorsiflexors are 0/5 Sensory.: intact to touch ,pinprick .position and vibratory sensation.  Coordination: Mild impaired right finger-to-nose and knee to heel coordination. Normal coordination on the left.  Gait and Station: Deferred as patient did not get a walker. Reflexes: 1+ and symmetric. Toes downgoing.   NIHSS  4 Modified [place]  4  ASSESSMENT: 46 year Caucasian lady with a right cerebellar infarct in August 2924 of embolic etiology without definite identified source. Vascular risk factors of hypertension, diabetes, hyperlipidemia, extracranial bilateral carotid stenosis age and sex. Significant residual gait ataxia and mild right hemiparesis . She has history of mild cognitive impairment which may be due to combination of underlying depression which may have been made worse by the  stroke    PLAN: I had a long d/w patient, daughter and son in law about his recent stroke, risk for recurrent stroke/TIAs, personally independently reviewed imaging studies and stroke evaluation results and answered questions.Continue aspirin 325 mg orally every day  for secondary stroke prevention and maintain strict control of hypertension with blood pressure goal below 130/90, diabetes with hemoglobin A1c goal below 6.5% and lipids with LDL cholesterol goal below 100 mg/dL. Continue conservative follow-up for her carotid stenosis for now and check ultrasound in 6-12 months I also advised the patient to eat a healthy diet with plenty of whole grains, cereals, fruits and vegetables, exercise regularly and maintain ideal body weight .I recommend the patient increase Cymbalta dose to 60 mg daily to help her post stroke depression and improve motivation. We also discussed possible side effects and advised the patient to see treatment for nausea with primary physician and upcoming visit this this week. He also discussed fall prevention precautions and encouraged the patient to ambulate with a walker and 1 person assist. We also talked about possibly moving into assisted living situationGreater than 50% of time during this 25 minute visit was spent on counseling,explanation of diagnosis, planning of further management discussion with patient and family and coordination of care .Followup in the future with me in 6 months or call earlier if necessary. Follow-up with Dr. Delice Lesch for her mild cognitive impairment  Antony Contras, MD Note: This document was prepared with digital dictation and possible smart phrase technology. Any transcriptional errors that result from this process are unintentional

## 2015-07-06 ENCOUNTER — Ambulatory Visit: Payer: Medicare Other | Admitting: Cardiology

## 2015-07-06 ENCOUNTER — Other Ambulatory Visit (HOSPITAL_COMMUNITY): Payer: Medicare Other

## 2015-07-09 ENCOUNTER — Ambulatory Visit (INDEPENDENT_AMBULATORY_CARE_PROVIDER_SITE_OTHER): Payer: Medicare Other | Admitting: *Deleted

## 2015-07-09 DIAGNOSIS — I639 Cerebral infarction, unspecified: Secondary | ICD-10-CM | POA: Diagnosis not present

## 2015-07-12 ENCOUNTER — Other Ambulatory Visit: Payer: Self-pay

## 2015-07-12 ENCOUNTER — Encounter: Payer: Self-pay | Admitting: Interventional Cardiology

## 2015-07-12 ENCOUNTER — Ambulatory Visit: Payer: Medicare Other | Admitting: Interventional Cardiology

## 2015-07-12 ENCOUNTER — Ambulatory Visit (INDEPENDENT_AMBULATORY_CARE_PROVIDER_SITE_OTHER): Payer: Medicare Other | Admitting: Interventional Cardiology

## 2015-07-12 ENCOUNTER — Ambulatory Visit (HOSPITAL_COMMUNITY): Payer: Medicare Other | Attending: Cardiology

## 2015-07-12 VITALS — BP 142/70 | HR 59 | Ht 64.0 in | Wt 142.4 lb

## 2015-07-12 DIAGNOSIS — E785 Hyperlipidemia, unspecified: Secondary | ICD-10-CM | POA: Insufficient documentation

## 2015-07-12 DIAGNOSIS — I429 Cardiomyopathy, unspecified: Secondary | ICD-10-CM | POA: Diagnosis not present

## 2015-07-12 DIAGNOSIS — E119 Type 2 diabetes mellitus without complications: Secondary | ICD-10-CM | POA: Diagnosis not present

## 2015-07-12 DIAGNOSIS — I10 Essential (primary) hypertension: Secondary | ICD-10-CM | POA: Insufficient documentation

## 2015-07-12 NOTE — Progress Notes (Signed)
LOOP RECORDER  

## 2015-07-12 NOTE — Patient Instructions (Signed)
Medication Instructions:  None  Labwork: None  Testing/Procedures: None  Follow-Up: Your physician wants you to follow-up in: 9-12 months with Dr. Tamala Julian. You will receive a reminder letter in the mail two months in advance. If you don't receive a letter, please call our office to schedule the follow-up appointment.   Any Other Special Instructions Will Be Listed Below (If Applicable).

## 2015-07-12 NOTE — Progress Notes (Signed)
Cardiology Office Note   Date:  07/12/2015   ID:  Arihanna Taylor, DOB 1933-09-06, MRN [id number]  PCP:  Kandice Hams, MD  Cardiologist:  Sinclair Grooms, MD   Chief Complaint  Patient presents with  . Congestive Heart Failure      History of Present Illness: Cathy Taylor is a 81 y.o. female who presents for systolic HF with EF <82%, possible embolic CVA, polymyalgia rheumatica, dementia, and remote history of breast cancer.  The patient has no cardiopulmonary complaints.  A loop recorder was placed related to her hospitalization earlier this year to determine if there are atrial arrhythmias that could lead to embolic CVA. To this point no significant arrhythmias have been identified.    Past Medical History  Diagnosis Date  . Stress fracture of left femur with nonunion 07/08/2011  . PMR (polymyalgia rheumatica) (HCC)   . GERD (gastroesophageal reflux disease)   . Anxiety   . Hypertension   . Abdominal hernia   . Hypercholesteremia   . Heart murmur   . Chronic back pain greater than 3 months duration   . Bronchiectasis 11/12  . 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 Depression   . Bronchitis        . Type II diabetes mellitus ([place])   . Umbilical hernia 8/00/34    unrepaired  . Breast cancer ([place]) 1973    right  . Arthritis   . Osteoarthritis of left shoulder 01/06/2012  . Stroke ([place])   . Left ventricular dysfunction 04/2015    EF 30% by TEE, + AK mid-apical anteroseptal and anterior walls plus apex, neg bubble study, grade III plaque asc Ao, suspect ICM    Past Surgical History  Procedure Laterality Date  . Tubal ligation    . Cataract extraction w/ intraocular lens implant  07/2011    left  . Shoulder arthroscopy  06/2000    left  . Femur surgery  07/07/2011    left rod placed  . Total shoulder arthroplasty  01/06/12    left  . Breast biopsy  1981    left  . Mastectomy  1973    right  . Total shoulder arthroplasty  01/06/2012    Procedure: TOTAL  SHOULDER ARTHROPLASTY;  Surgeon: Johnny Bridge, MD;  Location: [place];  Service: Orthopedics;  Laterality: Left;  . Ep implantable device N/A 05/08/2015    Procedure: Loop Recorder Insertion;  Surgeon: Will Meredith Leeds, MD;  Location: [place] [facility];  Service: Cardiovascular;  Laterality: N/A;  . Tee without cardioversion N/A 05/07/2015    Procedure: TRANSESOPHAGEAL ECHOCARDIOGRAM (TEE);  Surgeon: Larey Dresser, MD;  EF 30%, +WMA, no thrombus/PFO, suspect ICM       Current Outpatient Prescriptions  Medication Sig Dispense Refill  . acetaminophen (TYLENOL) 500 MG tablet Take 500 mg by mouth every 6 (six) hours as needed. For pain     . albuterol (PROVENTIL HFA;VENTOLIN HFA) 108 (90 BASE) MCG/ACT inhaler Inhale 2 puffs into the lungs every 6 (six) hours as needed. Shortness of breath    . aspirin 325 MG tablet Take 1 tablet (325 mg total) by mouth daily.    Marland Kitchen atorvastatin (LIPITOR) 80 MG tablet Take 1 tablet (80 mg total) by mouth daily at 6 PM.    . carvedilol (COREG) 6.25 MG tablet Take 1 tablet (6.25 mg total) by mouth 2 (two) times daily with a meal.    . Cholecalciferol (VITAMIN D-3 PO) Take  1,000 mg by mouth daily.     . diclofenac sodium (VOLTAREN) 1 % GEL Apply 1 application topically 4 (four) times daily as needed. Pain in shoulder    . DULoxetine (CYMBALTA) 60 MG capsule Take 1 capsule (60 mg total) by mouth daily. 30 capsule 5  . esomeprazole (NEXIUM) 20 MG capsule Take 20 mg by mouth 2 (two) times daily before a meal.    . lisinopril (PRINIVIL,ZESTRIL) 5 MG tablet Take 1 tablet (5 mg total) by mouth daily. 90 tablet 3  . metFORMIN (GLUCOPHAGE) 500 MG tablet Take 1,000 mg by mouth 2 (two) times daily. [employer] papers states 1000 mg tabs twice a day.. Pt states pill are 500 mg 2 in am 1 in pm     No current facility-administered medications for this visit.    Allergies:   Review of patient's allergies indicates no known allergies.    Social History:  The  patient  reports that she has never smoked. She quit smokeless tobacco use about 66 years ago. Her smokeless tobacco use included Snuff. She reports that she does not drink alcohol or use illicit drugs.   Family History:  The patient's family history includes Heart attack in her father; Stroke in her maternal aunt and son.    ROS:  Please see the history of present illness.   Otherwise, review of systems are positive for decreased memory.   All other systems are reviewed and negative.    PHYSICAL EXAM: VS:  BP 142/70 mmHg  Pulse 59  Ht 5\' 4"  (1.626 m)  Wt 64.592 kg (142 lb 6.4 oz)  BMI 24.43 kg/m2 , BMI Body mass index is 24.43 kg/(m^2). GEN: Well nourished, well developed, in no acute distress HEENT: normal Neck: no JVD, carotid bruits, or masses Cardiac: RRR.  There is no murmur, rub, or gallop. There is no edema. Respiratory:  clear to auscultation bilaterally, normal work of breathing. GI: soft, nontender, nondistended, + BS MS: no deformity or atrophy Skin: warm and dry, no rash Neuro:  Strength and sensation are intact Psych: euthymic mood, full affect   EKG:  EKG is not ordered today.   Recent Labs: 05/04/2015: ALT 14; TSH 1.475 05/05/2015: BUN 15; Creatinine, Ser 0.86; Hemoglobin 12.8; Platelets 276; Potassium 4.2; Sodium 138    Lipid Panel    Component Value Date/Time   CHOL 162 05/05/2015 0224   TRIG 147 05/05/2015 0224   HDL 45 05/05/2015 0224   CHOLHDL 3.6 05/05/2015 0224   VLDL 29 05/05/2015 0224   LDLCALC 88 05/05/2015 0224      Wt Readings from Last 3 Encounters:  07/12/15 64.592 kg (142 lb 6.4 oz)  07/02/15 66.225 kg (146 lb)  05/23/15 68.675 kg (151 lb 6.4 oz)      Other studies Reviewed: Additional studies/ records that were reviewed today include: Hospital discharge information. Loop recording downloads.. The findings include no arrhythmias identified.  Echocardiogram today compared with 05/05/2015: Study Conclusions  - Left ventricle: The  cavity size was normal. Systolic function was normal. The estimated ejection fraction was in the range of 60% to 65%. Wall motion was normal; there were no regional wall motion abnormalities. Doppler parameters are consistent with abnormal left ventricular relaxation (grade 1 diastolic dysfunction).  Impressions:  - When compared to prior, EF is now normal.   ASSESSMENT AND PLAN:  1. Cardiomyopathy ([place]) Improvement in LV systolic function back into the normal range.  2. Essential hypertension Well controlled.  3. CVA, possibly embolic  No atrial arrhythmias to suggest A. fib to this point from loop recorder.    Current medicines are reviewed at length with the patient today.  The patient has the following concerns regarding medicines: .  The following changes/actions have been instituted:    Continue to monitor the loop recorder.  No change in the current medical regimen  Labs/ tests ordered today include:  No orders of the defined types were placed in this encounter.     Disposition:   FU with HS in 9 months  Signed, Sinclair Grooms, MD  07/12/2015 5:40 PM    [facility] [address], [place], [place]  [address] Phone: [phone]; Fax: [phone]

## 2015-07-18 ENCOUNTER — Telehealth: Payer: Self-pay | Admitting: Neurology

## 2015-07-18 NOTE — Telephone Encounter (Signed)
Fax number is [phone] to [employer] form for her job. Nurse explain that there is a 25.00 charge and a release form has to be filled out and sign by her mom. Nurse will fax release form.

## 2015-07-18 NOTE — Telephone Encounter (Signed)
Pt's daughter called and wanted to make sure all FMLA paper work was received and no other paper work was needed. May call [name] at [phone]

## 2015-07-26 DIAGNOSIS — Z0289 Encounter for other administrative examinations: Secondary | ICD-10-CM

## 2015-07-26 LAB — CUP PACEART REMOTE DEVICE CHECK: MDC IDC SESS DTM: 20161015113511

## 2015-07-26 NOTE — Telephone Encounter (Signed)
Rn call Emelia Loron pts daughter about FMLA form. Rn explain [employer] fax [employer] the form with the wrong birthdate. [name] stated she will give the company a call.

## 2015-07-26 NOTE — Telephone Encounter (Signed)
Pt's daughter called sts [employer] said they do not have person by DOB of [birth year]. Pt's DOB is [birth year]. She is requesting to please look at form and  call her at [phone]

## 2015-07-26 NOTE — Progress Notes (Signed)
[facility] summary report received. Battery status OK. Normal device function. No new symptom episodes, tachy episodes, brady, or pause episodes. No new AF episodes. Monthly summary reports and ROV with JA PRN. 

## 2015-07-26 NOTE — Telephone Encounter (Signed)
Call patients daughter [name] about FMLA form. [name] stated she paid the 25.00 dollars for the form. Rn stated we have the correct form with the birth date. Rn stated she has to sign the form because she is requesting FMLA. Form fax for [name] to sign.

## 2015-07-30 NOTE — Telephone Encounter (Signed)
Fax sent to [employer] and confim

## 2015-07-30 NOTE — Telephone Encounter (Signed)
Rn confirm with Angie in billing, [name] pts daughter did pay 25.00 to have FMLA form filled out. Also health care power of attorney forms from daughter, forms sent to medical records for scanning.

## 2015-08-06 ENCOUNTER — Ambulatory Visit (INDEPENDENT_AMBULATORY_CARE_PROVIDER_SITE_OTHER): Payer: Medicare Other | Admitting: *Deleted

## 2015-08-06 DIAGNOSIS — I639 Cerebral infarction, unspecified: Secondary | ICD-10-CM

## 2015-08-06 NOTE — Progress Notes (Signed)
[facility] summary report / loop recorder 

## 2015-09-03 ENCOUNTER — Inpatient Hospital Stay (HOSPITAL_COMMUNITY)
Admission: EM | Admit: 2015-09-03 | Discharge: 2015-09-07 | DRG: 064 | Disposition: A | Discharge status: Skilled Nursing Facility | Payer: Medicare Other | Attending: Internal Medicine | Admitting: Internal Medicine

## 2015-09-03 ENCOUNTER — Inpatient Hospital Stay (HOSPITAL_COMMUNITY): Payer: Medicare Other

## 2015-09-03 ENCOUNTER — Telehealth: Payer: Self-pay | Admitting: Neurology

## 2015-09-03 ENCOUNTER — Emergency Department (HOSPITAL_COMMUNITY): Payer: Medicare Other

## 2015-09-03 ENCOUNTER — Encounter (HOSPITAL_COMMUNITY): Payer: Self-pay | Admitting: Emergency Medicine

## 2015-09-03 DIAGNOSIS — Z853 Personal history of malignant neoplasm of breast: Secondary | ICD-10-CM | POA: Diagnosis not present

## 2015-09-03 DIAGNOSIS — N390 Urinary tract infection, site not specified: Secondary | ICD-10-CM | POA: Diagnosis present

## 2015-09-03 DIAGNOSIS — R29810 Facial weakness: Secondary | ICD-10-CM | POA: Diagnosis present

## 2015-09-03 DIAGNOSIS — R4701 Aphasia: Secondary | ICD-10-CM | POA: Diagnosis present

## 2015-09-03 DIAGNOSIS — Z96619 Presence of unspecified artificial shoulder joint: Secondary | ICD-10-CM | POA: Diagnosis present

## 2015-09-03 DIAGNOSIS — R410 Disorientation, unspecified: Secondary | ICD-10-CM

## 2015-09-03 DIAGNOSIS — R29898 Other symptoms and signs involving the musculoskeletal system: Secondary | ICD-10-CM | POA: Diagnosis not present

## 2015-09-03 DIAGNOSIS — R270 Ataxia, unspecified: Secondary | ICD-10-CM | POA: Diagnosis present

## 2015-09-03 DIAGNOSIS — M549 Dorsalgia, unspecified: Secondary | ICD-10-CM | POA: Diagnosis present

## 2015-09-03 DIAGNOSIS — G9340 Encephalopathy, unspecified: Secondary | ICD-10-CM | POA: Insufficient documentation

## 2015-09-03 DIAGNOSIS — Z9010 Acquired absence of unspecified breast and nipple: Secondary | ICD-10-CM | POA: Diagnosis not present

## 2015-09-03 DIAGNOSIS — I5022 Chronic systolic (congestive) heart failure: Secondary | ICD-10-CM | POA: Diagnosis present

## 2015-09-03 DIAGNOSIS — R110 Nausea: Secondary | ICD-10-CM | POA: Diagnosis present

## 2015-09-03 DIAGNOSIS — F329 Major depressive disorder, single episode, unspecified: Secondary | ICD-10-CM | POA: Diagnosis present

## 2015-09-03 DIAGNOSIS — Z66 Do not resuscitate: Secondary | ICD-10-CM | POA: Diagnosis present

## 2015-09-03 DIAGNOSIS — Z7984 Long term (current) use of oral hypoglycemic drugs: Secondary | ICD-10-CM

## 2015-09-03 DIAGNOSIS — I444 Left anterior fascicular block: Secondary | ICD-10-CM | POA: Diagnosis present

## 2015-09-03 DIAGNOSIS — R531 Weakness: Secondary | ICD-10-CM

## 2015-09-03 DIAGNOSIS — K219 Gastro-esophageal reflux disease without esophagitis: Secondary | ICD-10-CM | POA: Diagnosis present

## 2015-09-03 DIAGNOSIS — I110 Hypertensive heart disease with heart failure: Secondary | ICD-10-CM | POA: Diagnosis present

## 2015-09-03 DIAGNOSIS — F32A Depression, unspecified: Secondary | ICD-10-CM | POA: Diagnosis present

## 2015-09-03 DIAGNOSIS — E876 Hypokalemia: Secondary | ICD-10-CM | POA: Diagnosis present

## 2015-09-03 DIAGNOSIS — Z8673 Personal history of transient ischemic attack (TIA), and cerebral infarction without residual deficits: Secondary | ICD-10-CM | POA: Diagnosis not present

## 2015-09-03 DIAGNOSIS — E785 Hyperlipidemia, unspecified: Secondary | ICD-10-CM | POA: Diagnosis present

## 2015-09-03 DIAGNOSIS — E7800 Pure hypercholesterolemia, unspecified: Secondary | ICD-10-CM | POA: Diagnosis present

## 2015-09-03 DIAGNOSIS — R413 Other amnesia: Secondary | ICD-10-CM | POA: Diagnosis present

## 2015-09-03 DIAGNOSIS — R112 Nausea with vomiting, unspecified: Secondary | ICD-10-CM | POA: Diagnosis present

## 2015-09-03 DIAGNOSIS — R4781 Slurred speech: Secondary | ICD-10-CM | POA: Diagnosis present

## 2015-09-03 DIAGNOSIS — G8929 Other chronic pain: Secondary | ICD-10-CM | POA: Diagnosis present

## 2015-09-03 DIAGNOSIS — I10 Essential (primary) hypertension: Secondary | ICD-10-CM | POA: Diagnosis present

## 2015-09-03 DIAGNOSIS — Z79899 Other long term (current) drug therapy: Secondary | ICD-10-CM

## 2015-09-03 DIAGNOSIS — E119 Type 2 diabetes mellitus without complications: Secondary | ICD-10-CM

## 2015-09-03 DIAGNOSIS — I639 Cerebral infarction, unspecified: Secondary | ICD-10-CM | POA: Diagnosis present

## 2015-09-03 DIAGNOSIS — G43A0 Cyclical vomiting, not intractable: Secondary | ICD-10-CM

## 2015-09-03 DIAGNOSIS — F419 Anxiety disorder, unspecified: Secondary | ICD-10-CM | POA: Diagnosis present

## 2015-09-03 DIAGNOSIS — E1159 Type 2 diabetes mellitus with other circulatory complications: Secondary | ICD-10-CM | POA: Diagnosis not present

## 2015-09-03 DIAGNOSIS — Z7982 Long term (current) use of aspirin: Secondary | ICD-10-CM

## 2015-09-03 DIAGNOSIS — R471 Dysarthria and anarthria: Secondary | ICD-10-CM | POA: Diagnosis present

## 2015-09-03 DIAGNOSIS — J479 Bronchiectasis, uncomplicated: Secondary | ICD-10-CM

## 2015-09-03 DIAGNOSIS — M353 Polymyalgia rheumatica: Secondary | ICD-10-CM | POA: Diagnosis present

## 2015-09-03 DIAGNOSIS — R5381 Other malaise: Secondary | ICD-10-CM | POA: Insufficient documentation

## 2015-09-03 DIAGNOSIS — Z794 Long term (current) use of insulin: Secondary | ICD-10-CM

## 2015-09-03 DIAGNOSIS — I6789 Other cerebrovascular disease: Secondary | ICD-10-CM | POA: Diagnosis not present

## 2015-09-03 LAB — COMPREHENSIVE METABOLIC PANEL
ALBUMIN: 3.3 g/dL — AB (ref 3.5–5.0)
ALK PHOS: 72 U/L (ref 38–126)
ALT: 11 U/L — ABNORMAL LOW (ref 14–54)
ANION GAP: 11 (ref 5–15)
AST: 17 U/L (ref 15–41)
BILIRUBIN TOTAL: 1 mg/dL (ref 0.3–1.2)
BUN: 8 mg/dL (ref 6–20)
CALCIUM: 9.2 mg/dL (ref 8.9–10.3)
CO2: 25 mmol/L (ref 22–32)
Chloride: 105 mmol/L (ref 101–111)
Creatinine, Ser: 0.75 mg/dL (ref 0.44–1.00)
GFR calc Af Amer: >60 mL/min (ref >60)
GFR calc non Af Amer: >60 mL/min (ref >60)
GLUCOSE: 84 mg/dL (ref 65–99)
POTASSIUM: 3.4 mmol/L — AB (ref 3.5–5.1)
SODIUM: 141 mmol/L (ref 135–145)
TOTAL PROTEIN: 6.8 g/dL (ref 6.5–8.1)

## 2015-09-03 LAB — URINE MICROSCOPIC-ADD ON: RBC / HPF: NONE SEEN RBC/hpf (ref 0–5)

## 2015-09-03 LAB — LIPASE, BLOOD: Lipase: 30 U/L (ref 11–51)

## 2015-09-03 LAB — URINALYSIS, ROUTINE W REFLEX MICROSCOPIC
BILIRUBIN URINE: NEGATIVE
GLUCOSE, UA: NEGATIVE mg/dL
HGB URINE DIPSTICK: NEGATIVE
KETONES UR: NEGATIVE mg/dL
Nitrite: NEGATIVE
PROTEIN: NEGATIVE mg/dL
Specific Gravity, Urine: 1.013 (ref 1.005–1.030)
pH: 6.5 (ref 5.0–8.0)

## 2015-09-03 LAB — RAPID URINE DRUG SCREEN, HOSP PERFORMED
Amphetamines: NOT DETECTED
BARBITURATES: NOT DETECTED
BENZODIAZEPINES: NOT DETECTED
COCAINE: NOT DETECTED
Opiates: NOT DETECTED
TETRAHYDROCANNABINOL: NOT DETECTED

## 2015-09-03 LAB — CBC WITH DIFFERENTIAL/PLATELET
BASOS PCT: 1 %
Basophils Absolute: 0.1 10*3/uL (ref 0.0–0.1)
EOS ABS: 0.4 10*3/uL (ref 0.0–0.7)
EOS PCT: 4 %
HCT: 39.4 % (ref 36.0–46.0)
Hemoglobin: 12.6 g/dL (ref 12.0–15.0)
LYMPHS ABS: 3.6 10*3/uL (ref 0.7–4.0)
Lymphocytes Relative: 36 %
MCH: 27.8 pg (ref 26.0–34.0)
MCHC: 32 g/dL (ref 30.0–36.0)
MCV: 86.8 fL (ref 78.0–100.0)
Monocytes Absolute: 0.8 10*3/uL (ref 0.1–1.0)
Monocytes Relative: 8 %
Neutro Abs: 5.1 10*3/uL (ref 1.7–7.7)
Neutrophils Relative %: 51 %
PLATELETS: 324 10*3/uL (ref 150–400)
RBC: 4.54 MIL/uL (ref 3.87–5.11)
RDW: 15.1 % (ref 11.5–15.5)
WBC: 10 10*3/uL (ref 4.0–10.5)

## 2015-09-03 LAB — TROPONIN I: Troponin I: <0.03 ng/mL (ref <0.031)

## 2015-09-03 LAB — APTT: aPTT: 33 seconds (ref 24–37)

## 2015-09-03 LAB — PROTIME-INR
INR: 1.18 (ref 0.00–1.49)
Prothrombin Time: 15.2 seconds (ref 11.6–15.2)

## 2015-09-03 LAB — ETHANOL: Alcohol, Ethyl (B): <5 mg/dL (ref <5)

## 2015-09-03 MED ORDER — SODIUM CHLORIDE 0.9 % IV SOLN
100.0000 mL/h | INTRAVENOUS | Status: DC
  Administered 2015-09-03: 100 mL/h via INTRAVENOUS

## 2015-09-03 MED ORDER — DULOXETINE HCL 60 MG PO CPEP
60.0000 mg | ORAL_CAPSULE | Freq: Every day | ORAL | Status: DC
  Administered 2015-09-05 – 2015-09-07 (×3): 60 mg via ORAL
  Filled 2015-09-03 (×3): qty 1

## 2015-09-03 MED ORDER — HYDRALAZINE HCL 20 MG/ML IJ SOLN: 5.0000 mg | INTRAMUSCULAR | Status: DC | PRN

## 2015-09-03 MED ORDER — IOHEXOL 350 MG/ML SOLN: 50.0000 mL | Freq: Once | INTRAVENOUS | Status: DC | PRN

## 2015-09-03 MED ORDER — HEPARIN SODIUM (PORCINE) 5000 UNIT/ML IJ SOLN
5000.0000 [IU] | Freq: Three times a day (TID) | INTRAMUSCULAR | Status: DC
  Administered 2015-09-04 – 2015-09-07 (×11): 5000 [IU] via SUBCUTANEOUS
  Filled 2015-09-03 (×11): qty 1

## 2015-09-03 MED ORDER — ONDANSETRON HCL 4 MG/2ML IJ SOLN: 4.0000 mg | Freq: Three times a day (TID) | INTRAMUSCULAR | Status: AC | PRN

## 2015-09-03 MED ORDER — SODIUM CHLORIDE 0.9 % IV SOLN
INTRAVENOUS | Status: AC
  Administered 2015-09-04: 01:00:00 via INTRAVENOUS

## 2015-09-03 MED ORDER — SODIUM CHLORIDE 0.9 % IV BOLUS (SEPSIS)
500.0000 mL | Freq: Once | INTRAVENOUS | Status: AC
  Administered 2015-09-03: 500 mL via INTRAVENOUS

## 2015-09-03 MED ORDER — ONDANSETRON HCL 4 MG/2ML IJ SOLN
4.0000 mg | Freq: Once | INTRAMUSCULAR | Status: AC
  Administered 2015-09-03: 4 mg via INTRAVENOUS
  Filled 2015-09-03: qty 2

## 2015-09-03 MED ORDER — ALBUTEROL SULFATE (2.5 MG/3ML) 0.083% IN NEBU: 2.5000 mg | INHALATION_SOLUTION | RESPIRATORY_TRACT | Status: DC | PRN

## 2015-09-03 MED ORDER — HYDROXYZINE HCL 50 MG/ML IM SOLN
25.0000 mg | Freq: Four times a day (QID) | INTRAMUSCULAR | Status: DC | PRN
  Filled 2015-09-03: qty 0.5

## 2015-09-03 MED ORDER — CARVEDILOL 6.25 MG PO TABS
6.2500 mg | ORAL_TABLET | Freq: Two times a day (BID) | ORAL | Status: DC
Start: 2015-09-04 — End: 2015-09-07
  Administered 2015-09-05 – 2015-09-07 (×5): 6.25 mg via ORAL
  Filled 2015-09-03 (×6): qty 1

## 2015-09-03 MED ORDER — STROKE: EARLY STAGES OF RECOVERY BOOK
Freq: Once | Status: AC
  Administered 2015-09-04: 02:00:00
  Filled 2015-09-03: qty 1

## 2015-09-03 MED ORDER — DICLOFENAC SODIUM 1 % TD GEL
2.0000 g | Freq: Four times a day (QID) | TRANSDERMAL | Status: DC | PRN
  Filled 2015-09-03: qty 100

## 2015-09-03 MED ORDER — ASPIRIN 325 MG PO TABS
325.0000 mg | ORAL_TABLET | Freq: Every day | ORAL | Status: DC
  Administered 2015-09-05 – 2015-09-07 (×3): 325 mg via ORAL
  Filled 2015-09-03 (×3): qty 1

## 2015-09-03 MED ORDER — VITAMIN D 1000 UNITS PO TABS
1000.0000 [IU] | ORAL_TABLET | Freq: Every day | ORAL | Status: DC
  Administered 2015-09-05 – 2015-09-07 (×3): 1000 [IU] via ORAL
  Filled 2015-09-03 (×4): qty 1

## 2015-09-03 MED ORDER — ASPIRIN 300 MG RE SUPP
300.0000 mg | Freq: Every day | RECTAL | Status: DC
  Administered 2015-09-04: 300 mg via RECTAL
  Filled 2015-09-03: qty 1

## 2015-09-03 MED ORDER — POTASSIUM CHLORIDE 10 MEQ/100ML IV SOLN
10.0000 meq | INTRAVENOUS | Status: AC
  Administered 2015-09-04 (×2): 10 meq via INTRAVENOUS
  Filled 2015-09-03 (×2): qty 100

## 2015-09-03 MED ORDER — ATORVASTATIN CALCIUM 80 MG PO TABS
80.0000 mg | ORAL_TABLET | Freq: Every day | ORAL | Status: DC
  Administered 2015-09-05 – 2015-09-06 (×2): 80 mg via ORAL
  Filled 2015-09-03 (×2): qty 1

## 2015-09-03 MED ORDER — SENNOSIDES-DOCUSATE SODIUM 8.6-50 MG PO TABS: 1.0000 | ORAL_TABLET | Freq: Every evening | ORAL | Status: DC | PRN

## 2015-09-03 MED ORDER — LISINOPRIL 5 MG PO TABS
5.0000 mg | ORAL_TABLET | Freq: Every day | ORAL | Status: DC
  Administered 2015-09-05 – 2015-09-07 (×3): 5 mg via ORAL
  Filled 2015-09-03 (×3): qty 1

## 2015-09-03 MED ORDER — PANTOPRAZOLE SODIUM 40 MG IV SOLR
40.0000 mg | INTRAVENOUS | Status: DC
Start: 2015-09-03 — End: 2015-09-06
  Administered 2015-09-04 – 2015-09-05 (×3): 40 mg via INTRAVENOUS
  Filled 2015-09-03 (×3): qty 40

## 2015-09-03 NOTE — ED Notes (Signed)
IV team attempted IV without success 

## 2015-09-03 NOTE — ED Notes (Signed)
Pt vomiting again, Dr. Vanita Panda made aware.  Zofran 4mg . IV given for same.  Pt to CT.

## 2015-09-03 NOTE — Telephone Encounter (Signed)
Pt's daughter called sts she thinks pt has had a stroke. She noticed symptoms on Friday(08/31/15) worsening over the weekend. Pt can not move her feet, she has  decreased ability use her hands, decreased ability to understand her as well as before. Pt said on Saturday she thought something wrong. Pt stares sometimes, she cannot remember something she said 1 hr before, she forgets to finish sentences. Daughter sts she has not taken pt to hospital. She is inquiring if pt could see Dr Erlinda Hong?

## 2015-09-03 NOTE — Consult Note (Signed)
Admission H&P    Chief Complaint: Transient facial weakness and slurred speech.  HPI: Cathy Taylor is an 81 y.o. female history diabetes mellitus, hypertension, hyperlipidemia and previous stroke in August 2016, presenting following an episode of facial droop and slurring of speech. Facial droop resolved. Slurring of speech improved but did not completely resolve. CT scan of her head showed no acute intracranial abnormality. Patient has been taking aspirin 125 mg per day. Family also indicated that she's had some difficulty with walking over the past couple of days. She also was very irritable on the day prior to admission, but had no clear focal deficits.  LSN: 3 PM on 09/03/2015 tPA Given: No: Beyond time window for treatment consideration as well as rapidly improved deficits mRankin:  Past Medical History  Diagnosis Date  . Stress fracture of left femur with nonunion 07/08/2011  . PMR (polymyalgia rheumatica) (HCC)   . GERD (gastroesophageal reflux disease)   . Anxiety   . Hypertension   . Abdominal hernia   . Hypercholesteremia   . Heart murmur   . Chronic back pain greater than 3 months duration   . Bronchiectasis 11/12  . 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 Depression   . Bronchitis        . Type II diabetes mellitus ([place])   . Umbilical hernia 11/18/31    unrepaired  . Breast cancer ([place]) 1973    right  . Arthritis   . Osteoarthritis of left shoulder 01/06/2012  . Stroke ([place])   . Left ventricular dysfunction 04/2015    EF 30% by TEE, + AK mid-apical anteroseptal and anterior walls plus apex, neg bubble study, grade III plaque asc Ao, suspect ICM    Past Surgical History  Procedure Laterality Date  . Tubal ligation    . Cataract extraction w/ intraocular lens implant  07/2011    left  . Shoulder arthroscopy  06/2000    left  . Femur surgery  07/07/2011    left rod placed  . Total shoulder arthroplasty  01/06/12    left  . Breast biopsy  1981    left  . Mastectomy   1973    right  . Total shoulder arthroplasty  01/06/2012    Procedure: TOTAL SHOULDER ARTHROPLASTY;  Surgeon: Johnny Bridge, MD;  Location: [place];  Service: Orthopedics;  Laterality: Left;  . Ep implantable device N/A 05/08/2015    Procedure: Loop Recorder Insertion;  Surgeon: Will Meredith Leeds, MD;  Location: [place] [facility];  Service: Cardiovascular;  Laterality: N/A;  . Tee without cardioversion N/A 05/07/2015    Procedure: TRANSESOPHAGEAL ECHOCARDIOGRAM (TEE);  Surgeon: Larey Dresser, MD;  EF 30%, +WMA, no thrombus/PFO, suspect ICM      Family History  Problem Relation Age of Onset  . Heart attack Father   . Stroke Son   . Stroke Maternal Aunt    Social History:  reports that she has never smoked. She quit smokeless tobacco use about 66 years ago. Her smokeless tobacco use included Snuff. She reports that she does not drink alcohol or use illicit drugs.  Allergies: No Known Allergies  Medications: Asians preadmission medications were reviewed by me.  ROS: History obtained from patient's daughters and son-in-law.  General ROS: negative for - chills, fatigue, fever, night sweats, weight gain or weight loss Psychological ROS: negative for - behavioral disorder, hallucinations, memory difficulties, mood swings or suicidal ideation Ophthalmic ROS: negative for - blurry vision, double  vision, eye pain or loss of vision ENT ROS: negative for - epistaxis, nasal discharge, oral lesions, sore throat, tinnitus or vertigo Allergy and Immunology ROS: negative for - hives or itchy/watery eyes Hematological and Lymphatic ROS: negative for - bleeding problems, bruising or swollen lymph nodes Endocrine ROS: negative for - galactorrhea, hair pattern changes, polydipsia/polyuria or temperature intolerance Respiratory ROS: negative for - cough, hemoptysis, shortness of breath or wheezing Cardiovascular ROS: negative for - chest pain, dyspnea on exertion, edema or irregular  heartbeat Gastrointestinal ROS: negative for - abdominal pain, diarrhea, hematemesis, nausea/vomiting or stool incontinence Genito-Urinary ROS: negative for - dysuria, hematuria, incontinence or urinary frequency/urgency Musculoskeletal ROS: negative for - joint swelling or muscular weakness Neurological ROS: as noted in HPI Dermatological ROS: negative for rash and skin lesion changes  Physical Examination: Blood pressure 157/77, pulse 68, resp. rate 17, SpO2 99 %.  HEENT-  Normocephalic, no lesions, without obvious abnormality.  Normal external eye and conjunctiva.  Normal TM's bilaterally.  Normal auditory canals and external ears. Normal external nose, mucus membranes and septum.  Normal pharynx. Neck supple with no masses, nodes, nodules or enlargement. Cardiovascular - regular rate and rhythm, S1, S2 normal, no murmur, click, rub or gallop Lungs - chest clear, no wheezing, rales, normal symmetric air entry Abdomen - soft, non-tender; bowel sounds normal; no masses,  no organomegaly Extremities - no joint deformities, effusion, or inflammation and no edema  Neurologic Examination: Mental Status: Alert, disoriented to correct month, no acute distress.  Speech moderately dysarthric without evidence of aphasia. Able to follow commands without difficulty. Cranial Nerves: II-Visual fields were normal. III/IV/VI-Pupils were equal and regular to light. Extraocular movements were full and conjugate.    V/VII-no facial numbness and no facial weakness. VIII-normal. X-moderate dysarthria; symmetrical palatal movement. XI: trapezius strength/neck flexion strength normal bilaterally XII-midline tongue extension with normal strength. Motor: Slight drift of right upper extremity; motor exam otherwise unremarkable. Sensory: Normal throughout. Deep Tendon Reflexes: 1+ and symmetric. Plantars: Flexor bilaterally Cerebellar: Normal finger-to-nose testing slightly impaired bilaterally due to  intention tremor. Carotid auscultation: Normal  Results for orders placed or performed during the hospital encounter of 09/03/15 (from the past 48 hour(s))  APTT     Status: None   Collection Time: 09/03/15  5:11 PM  Result Value Ref Range   aPTT 33 24 - 37 seconds  Comprehensive metabolic panel     Status: Abnormal   Collection Time: 09/03/15  5:11 PM  Result Value Ref Range   Sodium 141 135 - 145 mmol/L   Potassium 3.4 (L) 3.5 - 5.1 mmol/L   Chloride 105 101 - 111 mmol/L   CO2 25 22 - 32 mmol/L   Glucose, Bld 84 65 - 99 mg/dL   BUN 8 6 - 20 mg/dL   Creatinine, Ser 0.75 0.44 - 1.00 mg/dL   Calcium 9.2 8.9 - 10.3 mg/dL   Total Protein 6.8 6.5 - 8.1 g/dL   Albumin 3.3 (L) 3.5 - 5.0 g/dL   AST 17 15 - 41 U/L   ALT 11 (L) 14 - 54 U/L   Alkaline Phosphatase 72 38 - 126 U/L   Total Bilirubin 1.0 0.3 - 1.2 mg/dL   GFR calc non Af Amer >60 >60 mL/min   GFR calc Af Amer >60 >60 mL/min    Comment: (NOTE) The eGFR has been calculated using the CKD EPI equation. This calculation has not been validated in all clinical situations. eGFR's persistently <60 mL/min signify possible Chronic Kidney Disease.  Anion gap 11 5 - 15  CBC WITH DIFFERENTIAL     Status: None   Collection Time: 09/03/15  5:11 PM  Result Value Ref Range   WBC 10.0 4.0 - 10.5 K/uL   RBC 4.54 3.87 - 5.11 MIL/uL   Hemoglobin 12.6 12.0 - 15.0 g/dL   HCT 39.4 36.0 - 46.0 %   MCV 86.8 78.0 - 100.0 fL   MCH 27.8 26.0 - 34.0 pg   MCHC 32.0 30.0 - 36.0 g/dL   RDW 15.1 11.5 - 15.5 %   Platelets 324 150 - 400 K/uL   Neutrophils Relative % 51 %   Neutro Abs 5.1 1.7 - 7.7 K/uL   Lymphocytes Relative 36 %   Lymphs Abs 3.6 0.7 - 4.0 K/uL   Monocytes Relative 8 %   Monocytes Absolute 0.8 0.1 - 1.0 K/uL   Eosinophils Relative 4 %   Eosinophils Absolute 0.4 0.0 - 0.7 K/uL   Basophils Relative 1 %   Basophils Absolute 0.1 0.0 - 0.1 K/uL  Ethanol     Status: None   Collection Time: 09/03/15  5:11 PM  Result Value Ref  Range   Alcohol, Ethyl (B) <5 <5 mg/dL    Comment:        LOWEST DETECTABLE LIMIT FOR SERUM ALCOHOL IS 5 mg/dL FOR MEDICAL PURPOSES ONLY   Troponin I     Status: None   Collection Time: 09/03/15  5:11 PM  Result Value Ref Range   Troponin I <0.03 <0.031 ng/mL    Comment:        NO INDICATION OF MYOCARDIAL INJURY.   Protime-INR     Status: None   Collection Time: 09/03/15  5:11 PM  Result Value Ref Range   Prothrombin Time 15.2 11.6 - 15.2 seconds   INR 1.18 0.00 - 1.49  Urine rapid drug screen (hosp performed)not at [facility]     Status: None   Collection Time: 09/03/15  8:46 PM  Result Value Ref Range   Opiates NONE DETECTED NONE DETECTED   Cocaine NONE DETECTED NONE DETECTED   Benzodiazepines NONE DETECTED NONE DETECTED   Amphetamines NONE DETECTED NONE DETECTED   Tetrahydrocannabinol NONE DETECTED NONE DETECTED   Barbiturates NONE DETECTED NONE DETECTED    Comment:        DRUG SCREEN FOR MEDICAL PURPOSES ONLY.  IF CONFIRMATION IS NEEDED FOR ANY PURPOSE, NOTIFY LAB WITHIN 5 DAYS.        LOWEST DETECTABLE LIMITS FOR URINE DRUG SCREEN Drug Class       Cutoff (ng/mL) Amphetamine      1000 Barbiturate      200 Benzodiazepine   062 Tricyclics       694 Opiates          300 Cocaine          300 THC              50   Urinalysis, Routine w reflex microscopic (not at [facility])     Status: Abnormal   Collection Time: 09/03/15  8:46 PM  Result Value Ref Range   Color, Urine YELLOW YELLOW   APPearance CLOUDY (A) CLEAR   Specific Gravity, Urine 1.013 1.005 - 1.030   pH 6.5 5.0 - 8.0   Glucose, UA NEGATIVE NEGATIVE mg/dL   Hgb urine dipstick NEGATIVE NEGATIVE   Bilirubin Urine NEGATIVE NEGATIVE   Ketones, ur NEGATIVE NEGATIVE mg/dL   Protein, ur NEGATIVE NEGATIVE mg/dL   Nitrite NEGATIVE NEGATIVE  Leukocytes, UA SMALL (A) NEGATIVE  Urine microscopic-add on     Status: Abnormal   Collection Time: 09/03/15  8:46 PM  Result Value Ref Range   Squamous Epithelial / LPF 0-5 (A)  NONE SEEN   WBC, UA 6-30 0 - 5 WBC/hpf   RBC / HPF NONE SEEN 0 - 5 RBC/hpf   Bacteria, UA MANY (A) NONE SEEN   Urine-Other MUCOUS PRESENT    Ct Head Wo Contrast  09/03/2015  CLINICAL DATA:  Acute onset of slurred speech and left-sided weakness. Intermittent vomiting. Initial encounter. EXAM: CT HEAD WITHOUT CONTRAST TECHNIQUE: Contiguous axial images were obtained from the base of the skull through the vertex without intravenous contrast. COMPARISON:  MRI/MRA of the head performed 05/04/2015, and CT of the head performed 05/06/2015 FINDINGS: There is no evidence of acute infarction, mass lesion, or intra- or extra-axial hemorrhage on CT. Prominence of the ventricles and sulci reflects mild to moderate cortical volume loss. Mild cerebellar atrophy is noted. Chronic encephalomalacia is noted at the right cerebellar hemisphere, reflecting remote infarct. Chronic ischemic change is noted at the basal ganglia bilaterally. Scattered periventricular and subcortical white matter change likely reflects small vessel ischemic microangiopathy. The brainstem and fourth ventricle are within normal limits. The cerebral hemispheres demonstrate grossly normal gray-white differentiation. No mass effect or midline shift is seen. There is no evidence of fracture; visualized osseous structures are unremarkable in appearance. The visualized portions of the orbits are within normal limits. The paranasal sinuses and mastoid air cells are well-aerated. No significant soft tissue abnormalities are seen. IMPRESSION: 1. No acute intracranial pathology seen on CT. 2. Mild to moderate cortical volume loss and scattered small vessel ischemic microangiopathy. 3. Chronic encephalomalacia at the right cerebellar hemisphere, reflecting remote infarct. Chronic ischemic change at the basal ganglia bilaterally. Electronically Signed   By: Garald Balding M.D.   On: 09/03/2015 21:28    Assessment: 81 y.o. female with multiple risk factors for  stroke as well as previous stroke presenting with transient ischemic attack or possible recurrent cerebral infarction.  Stroke Risk Factors - diabetes mellitus, hyperlipidemia and hypertension  Plan: 1. HgbA1c, fasting lipid panel 2. MRI of the brain without contrast 3. PT consult, OT consult, Speech consult 4. Echocardiogram 5. CT angiogram of head and neck with contrast 6. Prophylactic therapy-Antiplatelet med: Aspirin  7. Risk factor modification 8. Telemetry monitoring  C.R. Nicole Kindred, MD  09/03/2015, 10:51 PM

## 2015-09-03 NOTE — ED Notes (Signed)
EMS reports pt from home in which she has a caregiver during the day and family stays with pt at night.  Bystanders st's pt has been weak on right side since Fri. But today developed slurred speech.  Pt has hx of CVA in Aug. With weakness on right.  Per EMS CBG 100.

## 2015-09-03 NOTE — Telephone Encounter (Signed)
Rn call [name] pts daughter on her DPR list. Patients daughter said since Friday her mom has been unable to move her feet, she is having decrease use of her hands, her memory is not as good. Also her speech was getting worse, but its better today. Rn stated if patient is having worse symptoms she would need to be evaluated at the nearest ED. Pts daughter stated can Dr. Erlinda Hong or Dr.Sethi see her. Rn explain if patient had stroke in the past and she is getting worse, the hospital can see her quicker, and faster. Pts daughter stated they will watch her. Rn strongly pts daughter to take her to the nearest ED.Marland KitchenIvin Booty stated they will watch her.

## 2015-09-03 NOTE — H&P (Addendum)
[facility] History and Physical  Cathy Taylor [id number] DOB: 14-Dec-1932 DOA: 09/03/2015  Referring physician: ED physician PCP: Kandice Hams, MD  Specialists:   Chief Complaint: worsening right leg weakness and slurred speech  HPI: Cathy Taylor is a 81 y.o. female with PMH of diabetes mellitus, hypertension, hyperlipidemia, GERD, depression, PMR, chronic back pain, remote right breast cancer, systolic congestive heart failure, previous stroke in August 2016, cardiac loop recorder in place 05/08/15, who presents with worsening right leg weakness, slurred speech and facial droop.  Per patient's family, patient has chronic right leg weakness from previous stroke, which has been worsening since Friday. Patient also has been having slurred speech. She had one episode of facial droop which has resolved. Patient had headache and elevated blood pressure the family yesterday. The leading tendril hearing loss. Patient does not have abdominal pain or diarrhea. She did not have GI symptoms at home, but started having nausea and dry heaves in the emergency room. No fever, chills, symptoms of UTI.  In ED, patient was found to have negative CT head for acute intracranial abnormalities, INR 1.18, PTT 33, negative troponin, negative UDS, negative urinalysis, potassium 3.4, renal function OK. Patient's admitted to inpatient for further evaluation and treatment. Neurology was consulted by EDP  Where does patient live?   At home   Can patient participate in ADLs? None  Review of Systems:   General: no fevers, chills, no changes in body weight, has poor appetite, has fatigue HEENT: no blurry vision, hearing changes or sore throat Pulm: no dyspnea, coughing, wheezing CV: no chest pain, palpitations Abd: has nausea, no vomiting, abdominal pain, diarrhea, constipation GU: no dysuria, burning on urination, increased urinary frequency, hematuria  Ext: no leg edema Neuro: has R leg weakness and slurred  speech, no vision change or hearing loss Skin: no rash MSK: No muscle spasm, no deformity, no limitation of range of movement in spin Heme: No easy bruising.  Travel history: No recent long distant travel.  Allergy: No Known Allergies  Past Medical History  Diagnosis Date  . Stress fracture of left femur with nonunion 07/08/2011  . PMR (polymyalgia rheumatica) (HCC)   . GERD (gastroesophageal reflux disease)   . Anxiety   . Hypertension   . Abdominal hernia   . Hypercholesteremia   . Heart murmur   . Chronic back pain greater than 3 months duration   . Bronchiectasis 11/12  . 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 Depression   . Bronchitis        . Type II diabetes mellitus ([place])   . Umbilical hernia [id number]    unrepaired  . Breast cancer ([place]) 1973    right  . Arthritis   . Osteoarthritis of left shoulder 01/06/2012  . Stroke ([place])   . Left ventricular dysfunction 04/2015    EF 30% by TEE, + AK mid-apical anteroseptal and anterior walls plus apex, neg bubble study, grade III plaque asc Ao, suspect ICM    Past Surgical History  Procedure Laterality Date  . Tubal ligation    . Cataract extraction w/ intraocular lens implant  07/2011    left  . Shoulder arthroscopy  06/2000    left  . Femur surgery  07/07/2011    left rod placed  . Total shoulder arthroplasty  01/06/12    left  . Breast biopsy  1981    left  . Mastectomy  1973    right  . Total shoulder arthroplasty  01/06/2012    Procedure: TOTAL SHOULDER ARTHROPLASTY;  Surgeon: Johnny Bridge, MD;  Location: [facility];  Service: Orthopedics;  Laterality: Left;  . Ep implantable device N/A 05/08/2015    Procedure: Loop Recorder Insertion;  Surgeon: Will Meredith Leeds, MD;  Location: [facility];  Service: Cardiovascular;  Laterality: N/A;  . Tee without cardioversion N/A 05/07/2015    Procedure: TRANSESOPHAGEAL ECHOCARDIOGRAM (TEE);  Surgeon: Larey Dresser, MD;  EF 30%, +WMA, no thrombus/PFO, suspect ICM       Social History:  reports that she has never smoked. She quit smokeless tobacco use about 66 years ago. Her smokeless tobacco use included Snuff. She reports that she does not drink alcohol or use illicit drugs.  Family History:  Family History  Problem Relation Age of Onset  . Heart attack Father   . Stroke Son   . Stroke Maternal Aunt      Prior to Admission medications   Medication Sig Start Date End Date Taking? Authorizing Provider  acetaminophen (TYLENOL) 500 MG tablet Take 500 mg by mouth every 6 (six) hours as needed. For pain    Yes Historical Provider, MD  albuterol (PROVENTIL HFA;VENTOLIN HFA) 108 (90 BASE) MCG/ACT inhaler Inhale 2 puffs into the lungs every 6 (six) hours as needed. Shortness of breath   Yes Historical Provider, MD  aspirin 325 MG tablet Take 1 tablet (325 mg total) by mouth daily. 05/09/15  Yes Penelope Coop Vann, DO  atorvastatin (LIPITOR) 80 MG tablet Take 1 tablet (80 mg total) by mouth daily at 6 PM. 05/09/15  Yes Mechele Claude, DO  carvedilol (COREG) 6.25 MG tablet Take 1 tablet (6.25 mg total) by mouth 2 (two) times daily with a meal. 05/09/15  Yes Mechele Claude, DO  Cholecalciferol (VITAMIN D-3 PO) Take 1,000 mg by mouth daily.    Yes Historical Provider, MD  diclofenac sodium (VOLTAREN) 1 % GEL Apply 1 application topically 4 (four) times daily as needed. Pain in shoulder   Yes Historical Provider, MD  DULoxetine (CYMBALTA) 60 MG capsule Take 1 capsule (60 mg total) by mouth daily. 07/02/15  Yes Garvin Fila, MD  esomeprazole (NEXIUM) 20 MG capsule Take 20 mg by mouth 2 (two) times daily before a meal.   Yes Historical Provider, MD  glimepiride (AMARYL) 1 MG tablet Take 1 mg by mouth daily with breakfast.   Yes Historical Provider, MD  lisinopril (PRINIVIL,ZESTRIL) 5 MG tablet Take 1 tablet (5 mg total) by mouth daily. 05/23/15  Yes Rhonda G Barrett, PA-C  metFORMIN (GLUCOPHAGE) 500 MG tablet Take 1,000 mg by mouth 2 (two) times daily.  Universal healthcare papers states 1000 mg tabs twice a day.. Pt states pill are 500 mg 2 in am 1 in pm   Yes Historical Provider, MD    Physical Exam: Filed Vitals:   09/03/15 1750 09/03/15 1800 09/04/15 0009 09/04/15 0158  BP: 172/71 157/77 166/67 134/75  Pulse: 68  71 77  Temp:   99.1 F (37.3 C) 98.4 F (36.9 C)  TempSrc:   Oral Oral  Resp: 18 17 16 16   Height:   5\' 4"  (1.626 m)   Weight:   62.642 kg (138 lb 1.6 oz)   SpO2: 99%  93% 99%   General: Not in acute distress HEENT:       Eyes: PERRL, EOMI, no scleral icterus.       ENT: No discharge from the ears and nose, no pharynx injection, no tonsillar enlargement.  Neck: No JVD, no bruit, no mass felt. Heme: No neck lymph node enlargement. Cardiac: S1/S2, RRR, No murmurs, No gallops or rubs. Pulm: No rales, wheezing, rhonchi or rubs. Abd: Soft, nondistended, nontender, no rebound pain, no organomegaly, BS present. Ext: No pitting leg edema bilaterally. 2+DP/PT pulse bilaterally. Musculoskeletal: No joint deformities, No joint redness or warmth, no limitation of ROM in spin. Skin: No rashes.  Neuro: Alert, oriented X3, cranial nerves II-XII grossly intact, muscle strength 4/5 in all extremities, sensation to light touch intact.  Knee reflex 1+ bilaterally. positive Babinski's sign on the left. Normal finger to nose test. Psych: Patient is not psychotic, no suicidal or hemocidal ideation.  Labs on Admission:  Basic Metabolic Panel:  Recent Labs Lab 09/03/15 1711  NA 141  K 3.4*  CL 105  CO2 25  GLUCOSE 84  BUN 8  CREATININE 0.75  CALCIUM 9.2   Liver Function Tests:  Recent Labs Lab 09/03/15 1711  AST 17  ALT 11*  ALKPHOS 72  BILITOT 1.0  PROT 6.8  ALBUMIN 3.3*    Recent Labs Lab 09/03/15 1711  LIPASE 30   No results for input(s): AMMONIA in the last 168 hours. CBC:  Recent Labs Lab 09/03/15 1711  WBC 10.0  NEUTROABS 5.1  HGB 12.6  HCT 39.4  MCV 86.8  PLT 324   Cardiac  Enzymes:  Recent Labs Lab 09/03/15 1711  TROPONINI <0.03    BNP (last 3 results) No results for input(s): BNP in the last 8760 hours.  ProBNP (last 3 results) No results for input(s): PROBNP in the last 8760 hours.  CBG: No results for input(s): GLUCAP in the last 168 hours.  Radiological Exams on Admission: Ct Head Wo Contrast  09/03/2015  CLINICAL DATA:  Acute onset of slurred speech and left-sided weakness. Intermittent vomiting. Initial encounter. EXAM: CT HEAD WITHOUT CONTRAST TECHNIQUE: Contiguous axial images were obtained from the base of the skull through the vertex without intravenous contrast. COMPARISON:  MRI/MRA of the head performed 05/04/2015, and CT of the head performed 05/06/2015 FINDINGS: There is no evidence of acute infarction, mass lesion, or intra- or extra-axial hemorrhage on CT. Prominence of the ventricles and sulci reflects mild to moderate cortical volume loss. Mild cerebellar atrophy is noted. Chronic encephalomalacia is noted at the right cerebellar hemisphere, reflecting remote infarct. Chronic ischemic change is noted at the basal ganglia bilaterally. Scattered periventricular and subcortical white matter change likely reflects small vessel ischemic microangiopathy. The brainstem and fourth ventricle are within normal limits. The cerebral hemispheres demonstrate grossly normal gray-white differentiation. No mass effect or midline shift is seen. There is no evidence of fracture; visualized osseous structures are unremarkable in appearance. The visualized portions of the orbits are within normal limits. The paranasal sinuses and mastoid air cells are well-aerated. No significant soft tissue abnormalities are seen. IMPRESSION: 1. No acute intracranial pathology seen on CT. 2. Mild to moderate cortical volume loss and scattered small vessel ischemic microangiopathy. 3. Chronic encephalomalacia at the right cerebellar hemisphere, reflecting remote infarct. Chronic  ischemic change at the basal ganglia bilaterally. Electronically Signed   By: Garald Balding M.D.   On: 09/03/2015 21:28    EKG: Independently reviewed. QTC 485, LAD.   Assessment/Plan Principal Problem:   CVA (cerebral vascular accident) ([place]) Active Problems:   Hypertension   Diabetes mellitus ([place])   PMR (polymyalgia rheumatica) (HCC)   Bronchiectasis (HCC)   Slurred speech   Depression   Right leg weakness   Aphasia  Nausea   Hypokalemia   Stroke (cerebrum) (HCC)  CVA (cerebral vascular accident) ([place]): pt has hx of stroke, now presenting with worsening right leg weakness, facial droop and slurred speech, concerning for new stroke versus TIA. Neurology was consulted, Dr. Nicole Kindred saw patient.  -will admit to tele bed. -Appreciate Dr. Les Pou consultation with follow-up recommendations as follows: 1. HgbA1c, fasting lipid panel 2. MRI of the brain without contrast 3. PT consult, OT consult, Speech consult 4. Echocardiogram 5. CT angiogram of head and neck with contrast 6. Prophylactic therapy-Antiplatelet med: Aspirin  7. Risk factor modification 8. Telemetry monitoring  HTN: -on Coreg, lisinopril -IV hydralazine when necessary  Depression: Stable, no suicidal or homicidal ideations. -Continue home medications: Cymbalta  HLD: Last LDL was 88 on 05/05/15 -Continue home medications: Lipitor  GERD: -Protonix IV  Hypokalemia: K= 3.4 on admission. - Repleted  Nausea: patient started having nausea in the emergency room. She has dry heaves and spitting out saliva. It is most likely due to possible difficult swallowing. Patient does not have abdominal pain or diarrhea. -when necessary hydroxyzine for nausea -Check lipase -If develops abdominal pain, may consider CT abdomen/pelvis  DM-II: Last A1c 6.6 on 05/05/15, well controled. Patient is taking metformin and Amaryl at home -SSI  DVT ppx: SQ Heparin  Code Status: DNR Family Communication:   Yes, patient's two  daughers  at bed side Disposition Plan: Admit to inpatient   Date of Service 09/04/2015    Ivor Costa [facility] Pager [phone]  If 7PM-7AM, please contact night-coverage [email or web address] Password [email or web address] 09/04/2015, 2:23 AM

## 2015-09-03 NOTE — ED Notes (Signed)
2'nd IV team nurse in to attempt IV Access.

## 2015-09-03 NOTE — Telephone Encounter (Signed)
I spoke to the patient's daughter and she had taken her to [facility] emergency room. I advised her to finish the workup there and to call later for an office follow up appointment

## 2015-09-03 NOTE — ED Notes (Signed)
2'nd IV team nurse unable to obtain IV access.  Dr. Vanita Panda made aware.  Will attempt ultrasound IV

## 2015-09-03 NOTE — ED Notes (Signed)
IV team at bedside at this time. 

## 2015-09-03 NOTE — ED Notes (Signed)
IV attempted x's 2

## 2015-09-03 NOTE — ED Notes (Signed)
Pt vomited sm amount of green emesis.  Zofran 4mg  IV given for same

## 2015-09-03 NOTE — ED Notes (Signed)
Pt continues to have dry heaves with sm amount of emesis.  Pt will follow commands but speech continues to be slurred

## 2015-09-03 NOTE — ED Provider Notes (Signed)
CSN: [id number]     Arrival date & time 09/03/15  1607 History   First MD Initiated Contact with Patient 09/03/15 1620     Chief Complaint  Patient presents with  . Weakness     (Consider location/radiation/quality/duration/timing/severity/associated sxs/prior Treatment) HPI Patient presents with family members to assist with the history of present illness. Patient does have a history of memory loss, but can answer simple questions briefly, seemingly appropriately. Patient has a history of prior stroke. She has baseline generalized weakness, and he states that beginning about 3 days ago, she has had increasing weakness, on the right side, seemingly. Today, the patient also has had multiple episodes of transient speech difficulty, right facial droop. Family states that the patient's face is improved since earlier today, close to baseline. They state her speech is also currently in a non-symptomatic phase. During this illness, no new medication, diet, activity. The patient denies any pain, seems oriented appropriately to place, to family members. She denies any awareness of weakness.  Past Medical History  Diagnosis Date  . Stress fracture of left femur with nonunion 07/08/2011  . PMR (polymyalgia rheumatica) (HCC)   . GERD (gastroesophageal reflux disease)   . Anxiety   . Hypertension   . Abdominal hernia   . Hypercholesteremia   . Heart murmur   . Chronic back pain greater than 3 months duration   . Bronchiectasis 11/12  . 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 Depression   . Bronchitis        . Type II diabetes mellitus ([place])   . Umbilical hernia [id number]    unrepaired  . Breast cancer ([place]) 1973    right  . Arthritis   . Osteoarthritis of left shoulder 01/06/2012  . Stroke ([place])   . Left ventricular dysfunction 04/2015    EF 30% by TEE, + AK mid-apical anteroseptal and anterior walls plus apex, neg bubble study, grade III plaque asc Ao, suspect ICM   Past Surgical  History  Procedure Laterality Date  . Tubal ligation    . Cataract extraction w/ intraocular lens implant  07/2011    left  . Shoulder arthroscopy  06/2000    left  . Femur surgery  07/07/2011    left rod placed  . Total shoulder arthroplasty  01/06/12    left  . Breast biopsy  1981    left  . Mastectomy  1973    right  . Total shoulder arthroplasty  01/06/2012    Procedure: TOTAL SHOULDER ARTHROPLASTY;  Surgeon: Johnny Bridge, MD;  Location: [facility];  Service: Orthopedics;  Laterality: Left;  . Ep implantable device N/A 05/08/2015    Procedure: Loop Recorder Insertion;  Surgeon: Will Meredith Leeds, MD;  Location: [facility];  Service: Cardiovascular;  Laterality: N/A;  . Tee without cardioversion N/A 05/07/2015    Procedure: TRANSESOPHAGEAL ECHOCARDIOGRAM (TEE);  Surgeon: Larey Dresser, MD;  EF 30%, +WMA, no thrombus/PFO, suspect ICM     Family History  Problem Relation Age of Onset  . Heart attack Father   . Stroke Son   . Stroke Maternal Aunt    Social History  Substance Use Topics  . Smoking status: Never Smoker   . Smokeless tobacco: Former Systems developer    Types: Snuff    Quit date: 09/22/1948     Comment: "just tried smoking; didn't smoke to amount to nothing"  . Alcohol Use: No   OB History    No  data available     Review of Systems  Unable to perform ROS: Dementia      Allergies  Review of patient's allergies indicates no known allergies.  Home Medications   Prior to Admission medications   Medication Sig Start Date End Date Taking? Authorizing Provider  acetaminophen (TYLENOL) 500 MG tablet Take 500 mg by mouth every 6 (six) hours as needed. For pain     Historical Provider, MD  albuterol (PROVENTIL HFA;VENTOLIN HFA) 108 (90 BASE) MCG/ACT inhaler Inhale 2 puffs into the lungs every 6 (six) hours as needed. Shortness of breath    Historical Provider, MD  aspirin 325 MG tablet Take 1 tablet (325 mg total) by mouth daily. 05/09/15   Mechele Claude,  DO  atorvastatin (LIPITOR) 80 MG tablet Take 1 tablet (80 mg total) by mouth daily at 6 PM. 05/09/15   Mechele Claude, DO  carvedilol (COREG) 6.25 MG tablet Take 1 tablet (6.25 mg total) by mouth 2 (two) times daily with a meal. 05/09/15   Mechele Claude, DO  Cholecalciferol (VITAMIN D-3 PO) Take 1,000 mg by mouth daily.     Historical Provider, MD  diclofenac sodium (VOLTAREN) 1 % GEL Apply 1 application topically 4 (four) times daily as needed. Pain in shoulder    Historical Provider, MD  DULoxetine (CYMBALTA) 60 MG capsule Take 1 capsule (60 mg total) by mouth daily. 07/02/15   Garvin Fila, MD  esomeprazole (NEXIUM) 20 MG capsule Take 20 mg by mouth 2 (two) times daily before a meal.    Historical Provider, MD  lisinopril (PRINIVIL,ZESTRIL) 5 MG tablet Take 1 tablet (5 mg total) by mouth daily. 05/23/15   Rhonda G Barrett, PA-C  metFORMIN (GLUCOPHAGE) 500 MG tablet Take 1,000 mg by mouth 2 (two) times daily. [employer] healthcare papers states 1000 mg tabs twice a day.. Pt states pill are 500 mg 2 in am 1 in pm    Historical Provider, MD   There were no vitals taken for this visit. Physical Exam  Constitutional: She has a sickly appearance.  HENT:  Head: Normocephalic and atraumatic.  Eyes: Conjunctivae and EOM are normal.  Cardiovascular: Normal rate and regular rhythm.   Pulmonary/Chest: Effort normal and breath sounds normal. No stridor. No respiratory distress.  Abdominal: She exhibits no distension.  Musculoskeletal: She exhibits no edema.  Neurological: She is alert. She displays atrophy. She displays no tremor. No cranial nerve deficit. She exhibits normal muscle tone. She displays no seizure activity. Coordination normal.  No appreciable facial asymmetry, speech is brief, slurred, but appropriate. No pronator drift Strength is 4/5 in all 4 extremities  Skin: Skin is warm and dry.  Psychiatric: She is slowed and withdrawn. Cognition and memory are impaired.  Nursing  note and vitals reviewed.   ED Course  Procedures (including critical care time) Labs Review Labs Reviewed  COMPREHENSIVE METABOLIC PANEL - Abnormal; Notable for the following:    Potassium 3.4 (*)    Albumin 3.3 (*)    ALT 11 (*)    All other components within normal limits  URINALYSIS, ROUTINE W REFLEX MICROSCOPIC (NOT AT [facility]) - Abnormal; Notable for the following:    APPearance CLOUDY (*)    Leukocytes, UA SMALL (*)    All other components within normal limits  URINE MICROSCOPIC-ADD ON - Abnormal; Notable for the following:    Squamous Epithelial / LPF 0-5 (*)    Bacteria, UA MANY (*)    All other components within normal  limits  APTT  CBC WITH DIFFERENTIAL/PLATELET  ETHANOL  TROPONIN I  PROTIME-INR  URINE RAPID DRUG SCREEN, HOSP PERFORMED  LIPASE, BLOOD  HEMOGLOBIN A1C  LIPID PANEL    Imaging Review Ct Head Wo Contrast  09/03/2015  CLINICAL DATA:  Acute onset of slurred speech and left-sided weakness. Intermittent vomiting. Initial encounter. EXAM: CT HEAD WITHOUT CONTRAST TECHNIQUE: Contiguous axial images were obtained from the base of the skull through the vertex without intravenous contrast. COMPARISON:  MRI/MRA of the head performed 05/04/2015, and CT of the head performed 05/06/2015 FINDINGS: There is no evidence of acute infarction, mass lesion, or intra- or extra-axial hemorrhage on CT. Prominence of the ventricles and sulci reflects mild to moderate cortical volume loss. Mild cerebellar atrophy is noted. Chronic encephalomalacia is noted at the right cerebellar hemisphere, reflecting remote infarct. Chronic ischemic change is noted at the basal ganglia bilaterally. Scattered periventricular and subcortical white matter change likely reflects small vessel ischemic microangiopathy. The brainstem and fourth ventricle are within normal limits. The cerebral hemispheres demonstrate grossly normal gray-white differentiation. No mass effect or midline shift is seen. There is  no evidence of fracture; visualized osseous structures are unremarkable in appearance. The visualized portions of the orbits are within normal limits. The paranasal sinuses and mastoid air cells are well-aerated. No significant soft tissue abnormalities are seen. IMPRESSION: 1. No acute intracranial pathology seen on CT. 2. Mild to moderate cortical volume loss and scattered small vessel ischemic microangiopathy. 3. Chronic encephalomalacia at the right cerebellar hemisphere, reflecting remote infarct. Chronic ischemic change at the basal ganglia bilaterally. Electronically Signed   By: Garald Balding M.D.   On: 09/03/2015 21:28   I have personally reviewed and evaluated these images and lab results as part of my medical decision-making.   EKG Interpretation   Date/Time:  Monday September 03 2015 16:23:11 EST Ventricular Rate:  79 PR Interval:  163 QRS Duration: 93 QT Interval:  423 QTC Calculation: 485 R Axis:   -85 Text Interpretation:  Sinus rhythm Left anterior fascicular block Abnormal  R-wave progression, late transition Sinus rhythm Left anterior fasicular  block No significant change since last tracing Artifact Abnormal ekg  Confirmed by Carmin Muskrat  MD [phone]) on 09/03/2015 4:40:15 PM     Cardiac 80 sinus normal Pulse ox 99% room air normal  I discussed the patient's recent illness with multiple family members.  Chart review notable for demonstration of prior stroke, memory loss. Patient is only taking aspirin as stroke prophylaxis.  Marland Kitchen  Update: Patient has had multiple episodes of vomiting, has been unable to tolerate positioning for MRI. CT scan ordered.  Update: CT results discussed with our neurology colleague.. Patient will have CT angiography, be admitted to the hospitalist team for further evaluation and management.  MDM  Patient presents with new weakness for several days, as well as new intermittent speech deficits.  Patient is well beyond the window for acute  stroke intervention, but this remains concerning in spite of initial normal head CT. No evidence for ongoing infection. After discussion with our neurology team, the patient was admitted for further evaluation and management.   Carmin Muskrat, MD 09/04/15 [phone]

## 2015-09-03 NOTE — ED Notes (Signed)
Dr Stewart at bedside.

## 2015-09-04 ENCOUNTER — Inpatient Hospital Stay (HOSPITAL_COMMUNITY): Payer: Medicare Other

## 2015-09-04 ENCOUNTER — Inpatient Hospital Stay (HOSPITAL_COMMUNITY)
Admit: 2015-09-04 | Discharge: 2015-09-04 | Disposition: A | Discharge status: Home / Self Care | Payer: Medicare Other | Attending: Nurse Practitioner | Admitting: Nurse Practitioner

## 2015-09-04 DIAGNOSIS — I6789 Other cerebrovascular disease: Secondary | ICD-10-CM

## 2015-09-04 DIAGNOSIS — R29898 Other symptoms and signs involving the musculoskeletal system: Secondary | ICD-10-CM

## 2015-09-04 DIAGNOSIS — G9340 Encephalopathy, unspecified: Secondary | ICD-10-CM | POA: Insufficient documentation

## 2015-09-04 LAB — LIPID PANEL
CHOL/HDL RATIO: 2.8 ratio
Cholesterol: 82 mg/dL (ref 0–200)
HDL: 29 mg/dL — AB (ref >40)
LDL CALC: 37 mg/dL (ref 0–99)
TRIGLYCERIDES: 81 mg/dL (ref <150)
VLDL: 16 mg/dL (ref 0–40)

## 2015-09-04 LAB — GLUCOSE, CAPILLARY
GLUCOSE-CAPILLARY: 93 mg/dL (ref 65–99)
GLUCOSE-CAPILLARY: 61 mg/dL — AB (ref 65–99)
GLUCOSE-CAPILLARY: 106 mg/dL — AB (ref 65–99)
Glucose-Capillary: 71 mg/dL (ref 65–99)
Glucose-Capillary: 127 mg/dL — ABNORMAL HIGH (ref 65–99)
Glucose-Capillary: 66 mg/dL (ref 65–99)
Glucose-Capillary: 137 mg/dL — ABNORMAL HIGH (ref 65–99)
Glucose-Capillary: 20 mg/dL — CL (ref 65–99)
Glucose-Capillary: 156 mg/dL — ABNORMAL HIGH (ref 65–99)

## 2015-09-04 MED ORDER — DEXTROSE-NACL 5-0.9 % IV SOLN
INTRAVENOUS | Status: DC
  Administered 2015-09-04: 18:00:00 via INTRAVENOUS

## 2015-09-04 MED ORDER — DEXTROSE 50 % IV SOLN
INTRAVENOUS | Status: AC
  Filled 2015-09-04: qty 50

## 2015-09-04 MED ORDER — DEXTROSE 5 % IV SOLN
1.0000 g | INTRAVENOUS | Status: DC
  Administered 2015-09-04 – 2015-09-06 (×3): 1 g via INTRAVENOUS
  Filled 2015-09-04 (×4): qty 10

## 2015-09-04 MED ORDER — DEXTROSE 50 % IV SOLN
1.0000 | Freq: Once | INTRAVENOUS | Status: AC
  Administered 2015-09-04: 50 mL via INTRAVENOUS

## 2015-09-04 MED ORDER — INSULIN ASPART 100 UNIT/ML ~~LOC~~ SOLN
0.0000 [IU] | Freq: Three times a day (TID) | SUBCUTANEOUS | Status: DC
  Administered 2015-09-05: 1 [IU] via SUBCUTANEOUS

## 2015-09-04 MED ORDER — PROMETHAZINE HCL 25 MG/ML IJ SOLN
12.5000 mg | Freq: Once | INTRAMUSCULAR | Status: AC
  Administered 2015-09-04: 12.5 mg via INTRAVENOUS
  Filled 2015-09-04: qty 1

## 2015-09-04 MED ORDER — DEXTROSE 50 % IV SOLN
INTRAVENOUS | Status: AC
  Administered 2015-09-04: 50 mL
  Filled 2015-09-04: qty 50

## 2015-09-04 NOTE — Progress Notes (Signed)
  Echocardiogram 2D Echocardiogram has been performed.  Cathy Taylor 09/04/2015, 11:20 AM

## 2015-09-04 NOTE — Progress Notes (Signed)
Patient's sugar is 61, was given D 50. MD notify.

## 2015-09-04 NOTE — Progress Notes (Signed)
Pt arrived to unit via stretcher from the ED.  Pt alert but complaining of nausea.  Pt vomited after moving from stretcher to bed.  Family at bedside.  Pt oriented to unit, staff and plan of care.  Tele applied and central monitoring notified.  Will continue to monitor.

## 2015-09-04 NOTE — Progress Notes (Addendum)
PT Cancellation Note  Patient Details Name: Melizza Amonett MRN: [id number] DOB: [birth year]   Cancelled Treatment:    Reason Eval/Treat Not Completed: Patient at procedure or test/unavailable. Pt off unit for CT scan. Will follow up for ordered PT eval as schedule allows.   Addendum: Attempted again at 11:12 but still off unit for test/procedure. Will follow up in PM if schedule allows.   Canary Brim Ivory Broad, PT, DPT  09/04/2015, 8:22 AM

## 2015-09-04 NOTE — Progress Notes (Signed)
STROKE TEAM PROGRESS NOTE   HISTORY Cathy Taylor is an 81 y.o. female history diabetes mellitus, hypertension, hyperlipidemia and previous stroke in August 2016, presenting following an episode of facial droop and slurring of speech. Facial droop resolved. Slurring of speech improved but did not completely resolve. CT scan of her head showed no acute intracranial abnormality. Patient has been taking aspirin 125 mg per day. Family also indicated that she's had some difficulty with walking over the past couple of days. She also was very irritable on the day prior to admission, but had no clear focal deficits. She was LKW at 3 PM on 09/03/2015. Patient was not administered TPA secondary to beyond time window for treatment consideration as well as rapidly improved deficits. She was admitted for further evaluation and treatment.   SUBJECTIVE (INTERVAL HISTORY) Her 2 daughters and son-in-law are at the bedside.  they recounted the history with Dr. Leonie Man. Overall family is still worried about her. They report she has been this way for several days. They say she was just started on Remeron on Friday.   OBJECTIVE Temp:  [98.2 F (36.8 C)-99.1 F (37.3 C)] 98.2 F (36.8 C) (12/13 0400) Pulse Rate:  [63-93] 72 (12/13 1122) Cardiac Rhythm:  [-] Normal sinus rhythm (12/13 0700) Resp:  [14-18] 18 (12/13 1122) BP: (118-172)/(40-83) 123/40 mmHg (12/13 1122) SpO2:  [93 %-100 %] 99 % (12/13 0931) Weight:  [62.642 kg (138 lb 1.6 oz)] 62.642 kg (138 lb 1.6 oz) (12/13 0009)  CBC:   Recent Labs Lab 09/03/15 1711  WBC 10.0  NEUTROABS 5.1  HGB 12.6  HCT 39.4  MCV 86.8  PLT 0000000    Basic Metabolic Panel:   Recent Labs Lab 09/03/15 1711  NA 141  K 3.4*  CL 105  CO2 25  GLUCOSE 84  BUN 8  CREATININE 0.75  CALCIUM 9.2    Lipid Panel:     Component Value Date/Time   CHOL 162 05/05/2015 0224   TRIG 147 05/05/2015 0224   HDL 45 05/05/2015 0224   CHOLHDL 3.6 05/05/2015 0224   VLDL 29  05/05/2015 0224   LDLCALC 88 05/05/2015 0224   HgbA1c:  Lab Results  Component Value Date   HGBA1C 6.6* 05/05/2015   Urine Drug Screen:     Component Value Date/Time   LABOPIA NONE DETECTED 09/03/2015 2046   COCAINSCRNUR NONE DETECTED 09/03/2015 2046   LABBENZ NONE DETECTED 09/03/2015 2046   AMPHETMU NONE DETECTED 09/03/2015 2046   THCU NONE DETECTED 09/03/2015 2046   LABBARB NONE DETECTED 09/03/2015 2046      IMAGING  Ct Head Wo Contrast 09/03/2015   1. No acute intracranial pathology seen on CT. 2. Mild to moderate cortical volume loss and scattered small vessel ischemic microangiopathy. 3. Chronic encephalomalacia at the right cerebellar hemisphere, reflecting remote infarct. Chronic ischemic change at the basal ganglia bilaterally.   Mr Brain Wo Contrast 09/04/2015   1. Evolution of the right cerebellar infarcts since August. Petechial hemorrhage has developed. Developing encephalomalacia with no posterior fossa mass effect. 2. No new intracranial abnormality. Elsewhere stable MRI appearance of the brain with chronic small vessel disease.   2D Echocardiogram  - Left ventricle: The cavity size was normal. There was moderateconcentric hypertrophy. Systolic function was vigorous. Theestimated ejection fraction was in the range of 65% to 70%. Wallmotion was normal; there were no regional wall motionabnormalities. There was an increased relative contribution ofatrial contraction to ventricular filling. Doppler parameters areconsistent with abnormal left ventricular relaxation (grade 1diastolic  dysfunction). - Aortic valve: Trileaflet; mildly thickened, mildly calcifiedleaflets. - Mitral valve: Calcified annulus. - Atrial septum: There was increased thickness of the septum,consistent with lipomatous hypertrophy.   PHYSICAL EXAM Elderly Caucasian lady not in distress. . Afebrile. Head is nontraumatic. Neck is supple without bruit.    Cardiac exam no murmur or gallop. Lungs  are clear to auscultation. Distal pulses are well felt. Neurological Exam :  Drowsy but can be aroused. Dysarthric speech. Follows simple commands. Pupils irregular reactive. Extraocular movements are full range. Blinks to threat bilaterally. Fundi were not visualized. Vision acuity cannot be reliably tested. Mild right lower facial asymmetry. Tongue is midline. Motor system exam reveals mild right upper extremity weakness but cooperation is limited. Moves lower extremities well against gravity. Deep tendon reflexes are 1+ symmetric. Plantars downgoing. Sensation appears preserved bilaterally. Gait was not tested. ASSESSMENT/PLAN Cathy Taylor is a 81 y.o. female with history of diabetes mellitus, hypertension, hyperlipidemia, GERD, depression, PMR, chronic back pain, remote right breast cancer, systolic congestive heart failure, previous stroke on August 2016, cardiac loop recorder in place 05/08/15 presenting with worsening right leg weakness, slurred speech and facial droop. She did not receive IV t-PA due to beyond time window for treatment consideration as well as rapidly improved deficits.   Transient neurologic symptoms with dysarthria in setting of HA, etiology unclear, no acute sroke  Started on REmeron 15 mg on Friday night, only took 2 doses. May be contributory, will d/c.  Resultant  Lethargic state, dysarthria  MRI  No acute stroke. ;etechial hemorrhage in old infarcts, normal evolution  MRA  Not ordered  2D Echo  No source of embolus. EF up to 65-70%.  Loop recorder. No record of interrogation.  UA unremarkable  Check EEG  Check CXR  LDL 37  HgbA1c pending  Heparin 5000 units sq tid for VTE prophylaxis Diet NPO time specified  aspirin 325 mg daily prior to admission, now on aspirin 325 mg daily. Continue at discharge  Ongoing aggressive stroke risk factor management  Therapy recommendations:  pending   Disposition:  pending    Hypertension  Stable  Hyperlipidemia  Home meds:  lipitor 80mg  resumed in hospital  LDL 37, goal < 70  Continue statin at discharge  Diabetes  HgbA1c pending, goal < 7.0  Carotid stenosis, known, Asymptomatic  From Aug Admission  CUS showed bilateral 40-59% stenosis  CTA confirmed right ICA 75% stenosis and left ICA < 50%  Other Stroke Risk Factors  Hx stroke/TIA  04/2015 R superior cerebellar territory infarct secondary to either embolic source or large vessel stenosis  Family hx stroke (son and maternal aunt)  Other Active Problems  Depression  GERD  Hypokalemia  Nausea   Hospital day # [place] for Pager information 09/04/2015 3:07 PM  I have personally examined this patient, reviewed notes, independently viewed imaging studies, participated in medical decision making and plan of care. I have made any additions or clarifications directly to the above note. Agree with note above.  She presented with slurred speech and right facial weakness and concern for left brain stroke but MRI scan does not confirm that. She has had gradually worsening situation the last week or 2 and her underlying medical illness is a consideration which needs to be evaluated for. She remains at risk for neurological worsening, recurrent stroke, TIA needs ongoing evaluation. I had a long discussion of the bedside with the patient's daughter and answered questions. Check  EEG, chest x-ray, UA. Discontinue Remeron which was started last Friday  Antony Contras, MD Medical Director [place] Pager: [phone] 09/04/2015 4:09 PM    To contact Stroke Continuity provider, please refer to [email or web address] After hours, contact General Neurology

## 2015-09-04 NOTE — Progress Notes (Signed)
Patient's blood sugar 20 at this time, patient is asymptomatic at this time. Patient given D 50. WILL recheck blood sugar.

## 2015-09-04 NOTE — Procedures (Signed)
History: 81 yo F with transient episodes of slurred speech.   Sedation: None  Technique: This is a 21 channel routine scalp EEG performed at the bedside with bipolar and monopolar montages arranged in accordance to the international 10/20 system of electrode placement. One channel was dedicated to EKG recording.    Background: The background consists predominantly of low voltage(< 10 uV) smoothly contoured theta activity which  modulates with eye opening/closure suggesting that this is the posterior dominant rhythm. This is anteriorly shifted compared to a typical PDR, but still is slightly predomiant in the posterior. There is some mild generalized irregular delta activity as well.    Photic stimulation: Physiologic driving is not performed.   EEG Abnormalities: 1) Irregular slow activity 2) Slow PDR  Clinical Interpretation: This EEG is consistent with a generalized non-specific cerebral dysfunction(encephalopathy). There was no seizure or seizure predisposition recorded on this study. Please note that a normal EEG does not preclude the possibility of epilepsy.   Roland Rack, MD [facility] [phone]  If 7pm- 7am, please page neurology on call as listed in [place].

## 2015-09-04 NOTE — Progress Notes (Signed)
EEG Completed; Results Pending  

## 2015-09-04 NOTE — Progress Notes (Signed)
Occupational Therapy Evaluation Patient Details Name: Cathy Taylor MRN: [id number] DOB: Nov 10, 1932 Today's Date: 09/04/2015    History of Present Illness 81 y.o. female admitted with worsening R leg wekaness, slurred speech, and facial droop. PMH significant for memory loss, CVA (2016), HTN, hyperlipidemia, DM, GERD, depression, PMR, chronic back pain, remote R breast cancer, systolic CHF, cardiac loop recorder. CT and MRI (-) for acute intracranial abnormalities.   Clinical Impression   Unsure of pt's PLOF and assistance available upon discharge. OT evaluation limited by pt's severe lethargy. Pt completed sit-stand and stand-pivot transfer with mod +2 hand-held assist. Once sitting up in chair, pt more alert and conversive. Recommending post-acute rehab stay to increase independence and safety with ADLs and functional mobility. Recommending CIR consult.    Follow Up Recommendations  CIR;Supervision/Assistance - 24 hour    Equipment Recommendations  Other (comment) (TBD in next venue)    Recommendations for Other Services Rehab consult     Precautions / Restrictions Precautions Precautions: Fall Restrictions Weight Bearing Restrictions: No      Mobility Bed Mobility Overal bed mobility: Needs Assistance Bed Mobility: Rolling;Sidelying to Sit Rolling: Max assist Sidelying to sit: +2 for physical assistance;Min assist       General bed mobility comments: Rolled to L side, verbal/tactile cues to turn head and grab bedrail with R hand. Max assist to roll, min assist +2 to support trunk to come to sitting EOB.   Transfers Overall transfer level: Needs assistance Equipment used: 2 person hand held assist;Rolling walker (2 wheeled) Transfers: Sit to/from [facility] Sit to Stand: Mod assist;+2 physical assistance Stand pivot transfers: Mod assist;+2 physical assistance       General transfer comment: Mod +2 assist for boost to stand and to maintain balance  throughout transfer. Pt performed better with 2 person hand held assist than with RW. Verbal cues for step sequence - pt with decreaseed proprioception of RLE. Verbal cues for safe hand placement on seated surfaces.     Balance Overall balance assessment: Needs assistance Sitting-balance support: Feet supported;Bilateral upper extremity supported Sitting balance-Leahy Scale: Poor   Postural control: Posterior lean Standing balance support: Bilateral upper extremity supported Standing balance-Leahy Scale: Poor Standing balance comment: Requires bilateral UE support to maintain balance. Able to maintain midline - no posterior lean when standing                            ADL Overall ADL's : Needs assistance/impaired                         Toilet Transfer: Moderate assistance;+2 for physical assistance;Stand-pivot Toilet Transfer Details (indicate cue type and reason): Simulated transfer to chair           General ADL Comments: Pt very lethargic on OT arrival. Progressed to sitting EOB and completed stand-pivot transfer with mod +2 hand held assist. Pt more alert and conversive once sitting up in chair.     Vision Additional Comments: Unable to assess due to pt with severe lethargy   Perception     Praxis      Pertinent Vitals/Pain Pain Assessment: Faces Faces Pain Scale: Hurts a little bit Pain Intervention(s): Limited activity within patient's tolerance;Monitored during session;Repositioned     Hand Dominance     Extremity/Trunk Assessment Upper Extremity Assessment Upper Extremity Assessment: RUE deficits/detail;Generalized weakness RUE Deficits / Details: AROM shoulder 90 degrees, elbow 100 degrees; Strength 2-/5  all muscles; decreased sensation and numbness/tingling RUE Sensation: decreased light touch;decreased proprioception RUE Coordination: decreased fine motor;decreased gross motor   Lower Extremity Assessment Lower Extremity Assessment:  Generalized weakness;RLE deficits/detail RLE Deficits / Details: dragging R foot, decreased proprioception RLE Sensation: decreased proprioception RLE Coordination: decreased gross motor   Cervical / Trunk Assessment Cervical / Trunk Assessment: Kyphotic   Communication Communication Communication: Receptive difficulties;Expressive difficulties   Cognition Arousal/Alertness: Lethargic;Suspect due to medications Behavior During Therapy: Flat affect Overall Cognitive Status: No family/caregiver present to determine baseline cognitive functioning Area of Impairment: Orientation;Attention;Memory;Following commands;Safety/judgement;Awareness;Problem solving Orientation Level: Disoriented to;Place;Time;Situation Current Attention Level: Focused Memory: Decreased short-term memory Following Commands: Follows one step commands inconsistently;Follows one step commands with increased time Safety/Judgement: Decreased awareness of safety;Decreased awareness of deficits Awareness: Intellectual Problem Solving: Slow processing;Decreased initiation;Difficulty sequencing;Requires verbal cues;Requires tactile cues General Comments: Severe lethargy and no family present to determine pt's cognitive baseline. Memory loss at baseline per chart review.   General Comments       Exercises       Shoulder Instructions      Home Living Family/patient expects to be discharged to:: Private residence Living Arrangements: Spouse/significant other Available Help at Discharge: Family;Personal care attendant Type of Home: House                           Additional Comments: Question accuracy of pt's responses to home information - lethargy also limited amount of information gathered      Prior Functioning/Environment Level of Independence: Independent with assistive device(s)        Comments: Reports she does not walk with RW    OT Diagnosis: Generalized weakness;Cognitive  deficits;Paresis   OT Problem List: Decreased strength;Decreased range of motion;Decreased activity tolerance;Impaired balance (sitting and/or standing);Impaired vision/perception;Decreased coordination;Decreased cognition;Decreased safety awareness;Decreased knowledge of use of DME or AE;Impaired sensation;Obesity;Impaired UE functional use   OT Treatment/Interventions: Self-care/ADL training;Therapeutic exercise;Energy conservation;DME and/or AE instruction;Therapeutic activities;Patient/family education;Cognitive remediation/compensation;Balance training    OT Goals(Current goals can be found in the care plan section) Acute Rehab OT Goals Patient Stated Goal: unable to state OT Goal Formulation: With patient Time For Goal Achievement: 09/18/15 Potential to Achieve Goals: Fair ADL Goals Pt Will Perform Upper Body Bathing: with supervision;sitting Pt Will Perform Lower Body Bathing: with min assist;sitting/lateral leans Pt Will Transfer to Toilet: with min assist;ambulating;bedside commode Pt Will Perform Toileting - Clothing Manipulation and hygiene: with min assist;sitting/lateral leans  OT Frequency: Min 3X/week   Barriers to D/C:    Unsure of pt's PLOF and assistance available at home       Co-evaluation              End of Session Equipment Utilized During Treatment: Gait belt;Rolling walker Nurse Communication: Mobility status;Need for lift equipment Charlaine Dalton)  Activity Tolerance: Patient limited by lethargy Patient left: in chair;with call bell/phone within reach;with chair alarm set   Time: [id number] OT Time Calculation (min): 32 min Charges:  OT General Charges $OT Visit: 1 Procedure OT Evaluation $Initial OT Evaluation Tier I: 1 Procedure OT Treatments $Self Care/Home Management : 8-22 mins G-Codes:    Redmond Baseman, OTR/L September 26, 2015, 5:17 PM

## 2015-09-04 NOTE — Progress Notes (Signed)
MD notified of the blood sugar.

## 2015-09-04 NOTE — Therapy (Signed)
SLP Cancellation Note  Unable to complete BSE at this time, as pt is currently off unit for testing. Per RN, pt has been very sleepy this morning, after receiving phenergan for nausea/vomiting. ST will continue efforts to evaluate. Pt remains NPO at this time.  Colbie Danner B. Quentin Ore [facility], [place] [phone]

## 2015-09-04 NOTE — Evaluation (Signed)
Clinical/Bedside Swallow Evaluation Patient Details  Name: Cathy Taylor MRN: [id number] Date of Birth: Mar 10, 1933  Today's Date: 09/04/2015 Time: SLP Start Time (ACUTE ONLY): 1155 SLP Stop Time (ACUTE ONLY): 1215 SLP Time Calculation (min) (ACUTE ONLY): 20 min  Past Medical History:  Past Medical History  Diagnosis Date  . Stress fracture of left femur with nonunion 07/08/2011  . PMR (polymyalgia rheumatica) (HCC)   . GERD (gastroesophageal reflux disease)   . Anxiety   . Hypertension   . Abdominal hernia   . Hypercholesteremia   . Heart murmur   . Chronic back pain greater than 3 months duration   . Bronchiectasis 11/12  . 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 Depression   . Bronchitis        . Type II diabetes mellitus ([place])   . Umbilical hernia [id number]    unrepaired  . Breast cancer ([place]) 1973    right  . Arthritis   . Osteoarthritis of left shoulder 01/06/2012  . Stroke ([place])   . Left ventricular dysfunction 04/2015    EF 30% by TEE, + AK mid-apical anteroseptal and anterior walls plus apex, neg bubble study, grade III plaque asc Ao, suspect ICM   Past Surgical History:  Past Surgical History  Procedure Laterality Date  . Tubal ligation    . Cataract extraction w/ intraocular lens implant  07/2011    left  . Shoulder arthroscopy  06/2000    left  . Femur surgery  07/07/2011    left rod placed  . Total shoulder arthroplasty  01/06/12    left  . Breast biopsy  1981    left  . Mastectomy  1973    right  . Total shoulder arthroplasty  01/06/2012    Procedure: TOTAL SHOULDER ARTHROPLASTY;  Surgeon: Johnny Bridge, MD;  Location: [place];  Service: Orthopedics;  Laterality: Left;  . Ep implantable device N/A 05/08/2015    Procedure: Loop Recorder Insertion;  Surgeon: Will Meredith Leeds, MD;  Location: [facility];  Service: Cardiovascular;  Laterality: N/A;  . Tee without cardioversion N/A 05/07/2015    Procedure: TRANSESOPHAGEAL ECHOCARDIOGRAM (TEE);  Surgeon:  Larey Dresser, MD;  EF 30%, +WMA, no thrombus/PFO, suspect ICM     HPI:  81 year old female admitted 09/03/15 due to RLE weakness and dysarthria. 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 MRI revealed evolution of right cerebellar infarcts since 04/2015.  SLP completed SLE 05/05/15, where pt scored 26/30 on MMSE.    Assessment / Plan / Recommendation Clinical Impression  Pt presents with marked lethargy and significant overall weakness. Oral care with suction was completed by SLP. Pt tolerated oral care, then moisturizer applied to lips. Pt voice quality was low in intensity, and very weak. Pt unable to follow directions. At this time, pt is not safe for po intake due to lethargy and weakness. Per RN, pt was having nausea and vomiting, and was given phenergan, which may be contributing to current somnolence. Recommend continuing with NPO status until pt is alert enough to take po safely. RN and family members present were notified of results and recommendations. ST will follow.    Aspiration Risk  Severe aspiration risk;Risk for inadequate nutrition/hydration    Diet Recommendation NPO   Medication Administration: Via alternative means    Other  Recommendations Oral Care Recommendations: Oral care QID;Staff/trained caregiver to provide oral care Other Recommendations: Have oral suction  available   Follow up Recommendations       Frequency and Duration min 1 x/week  2 weeks       Prognosis Prognosis for Safe Diet Advancement: Fair      Swallow Study   General Date of Onset: 09/03/15 HPI: 81 year old female admitted 09/03/15 due to RLE weakness and dysarthria. 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 MRI revealed evolution of right cerebellar infarcts since 04/2015.  SLP completed SLE 05/05/15, where pt scored 26/30 on MMSE.  Type of Study: Bedside Swallow Evaluation Previous Swallow Assessment: none  found Diet Prior to this Study: NPO Temperature Spikes Noted: No Respiratory Status: Nasal cannula History of Recent Intubation: No Behavior/Cognition: Doesn't follow directions;Lethargic/Drowsy Oral Cavity Assessment: Dry Oral Care Completed by SLP: Yes Oral Cavity - Dentition: Adequate natural dentition Self-Feeding Abilities:  (inappropriate for po intake at this time.) Patient Positioning: Partially reclined Baseline Vocal Quality: Low vocal intensity;Breathy Volitional Cough: Cognitively unable to elicit Volitional Swallow: Unable to elicit    Oral/Motor/Sensory Function Overall Oral Motor/Sensory Function:  (unable to assess at this time.)   Ice Chips Ice chips: Not tested   Thin Liquid Thin Liquid: Not tested    Nectar Thick Nectar Thick Liquid: Not tested   Honey Thick Honey Thick Liquid: Not tested   Puree Puree: Not tested   Solid Solid: Not tested       Shonna Chock 09/04/2015,12:18 PM   Enriqueta Shutter. Quentin Ore [facility], [place] [phone]

## 2015-09-04 NOTE — Progress Notes (Signed)
OT Cancellation Note  Patient Details Name: Cathy Taylor MRN: [id number] DOB: 31-Aug-1933   Cancelled Treatment:    Reason Eval/Treat Not Completed: Patient at procedure or test/ unavailable (Echo). Will attempt to see later if schedule allows.   Redmond Baseman, OTR/L 09/04/2015, 11:30 AM

## 2015-09-04 NOTE — Progress Notes (Signed)
[facility] PROGRESS NOTE  Analeah Meinhardt [id number] DOB: 1933/06/27 DOA: 09/03/2015 PCP: Kandice Hams, MD  Assessment/Plan: 1. Dysarthria/slurred speech/right leg weakness -Mrs. Galas is an 81 year old female with a history of CVA back in August 2016, presented with dysarthria, worsening right leg weakness and facial droop. -Initial workup included a CT scan of brain did not reveal acute intracranial abnormality. -She was placed on CVA protocol and admitted to telemetry. -MRI of brain performed on 09/04/2015 show acute CVA. Radiology mention evolution of the right cerebellar infarct since August. -Transthoracic echocardiogram showed EF of 65-70% -She was seen and evaluated by the stroke team who recommended obtaining EEG, chest x-ray, urinalysis with the discontinuation of Remeron. -Will continue antiplatelet therapy with aspirin 325 mg by mouth daily.  2.  Altered mental status/acute encephalopathy. -Patient lethargic over the course of the day, difficulty staying awake. -She was administered IV Phenergan overnight for multiple episodes of nausea and vomiting. -Workup included a urinalysis which did reveal the presence of many bacteria. Starting ceftriaxone 1 g IV every 24 hours the event that this may be contributing to encephalopathy.  3.  Hypertension. -Blood pressure stable -Continue carvedilol 6.25 mg by mouth twice a day and lisinopril 5 mg by mouth daily  4.  Urinary tract infection. -Urinalysis showed the presence of many bacteria with WBCs. -Plan to treat empirically with ceftriaxone 1 g IV every 24 hours given presence of encephalopathy. -Follow-up on urine cultures.  5.  History of diabetes. -Her oral hypoglycemics were held as she is nothing by mouth.  -Patient's blood sugars dropping to 61, will change IV fluids to D5 normal saline  Code Status: DO NOT RESUSCITATE Family Communication: I spoke with her daughter and son-in-law at bedside Disposition Plan:  Starting IV antibiotic therapy, patient remains encephalopathic   Consultants:  Neurology  Antibiotics:  Ceftriaxone 1 g IV every 24 hours  HPI/Subjective: Mrs Jeffords is a pleasant 81 year old female with a history of hypertension, diabetes mellitus, having a stroke in August 2016 at which time she presented with dysarthria, facial droop. She was brought to the emergency department for worsening right leg weakness and slurred speech. Initial workup included a CT scan of brain which was negative. Overnight she had multiple episodes of nausea vomiting for she was given Phenergan. He was admitted to the hospital and placed on the stroke protocol. MRI of brain performed today did not show an acute stroke. There was evolution of right cerebellar infarcts that have been present since August. During my encounter patient was sedated, difficult to arouse which I suspect was related to sedatives she had received overnight. It was difficult to perform neurologic examination. Neurology was consulted during this hospitalization.  Objective: Filed Vitals:   09/04/15 0931 09/04/15 1122  BP: 122/56 123/40  Pulse: 76 72  Temp:    Resp: 18 18   No intake or output data in the 24 hours ending 09/04/15 1539 Filed Weights   09/04/15 0009  Weight: 62.642 kg (138 lb 1.6 oz)    Exam:   General:  Patient is lethargic, difficulty staying awake.  Cardiovascular: Regular rate and rhythm normal S1-S2 no murmurs rubs or gallops  Respiratory: Normal respiratory effort, lungs are clear to auscultation bilaterally  Abdomen: Soft nontender nondistended  Musculoskeletal: No edema  Neurological: Patient has lethargic, difficult to perform adequate neurologic examination having trouble staying awake  Data Reviewed: Basic Metabolic Panel:  Recent Labs Lab 09/03/15 1711  NA 141  K 3.4*  CL 105  CO2 25  GLUCOSE 84  BUN 8  CREATININE 0.75  CALCIUM 9.2   Liver Function Tests:  Recent Labs Lab  09/03/15 1711  AST 17  ALT 11*  ALKPHOS 72  BILITOT 1.0  PROT 6.8  ALBUMIN 3.3*    Recent Labs Lab 09/03/15 1711  LIPASE 30   No results for input(s): AMMONIA in the last 168 hours. CBC:  Recent Labs Lab 09/03/15 1711  WBC 10.0  NEUTROABS 5.1  HGB 12.6  HCT 39.4  MCV 86.8  PLT 324   Cardiac Enzymes:  Recent Labs Lab 09/03/15 1711  TROPONINI <0.03   BNP (last 3 results) No results for input(s): BNP in the last 8760 hours.  ProBNP (last 3 results) No results for input(s): PROBNP in the last 8760 hours.  CBG:  Recent Labs Lab 09/04/15 0412 09/04/15 0934 09/04/15 1137 09/04/15 1327  GLUCAP 93 71 61* 127*    No results found for this or any previous visit (from the past 240 hour(s)).   Studies: Ct Head Wo Contrast  09/03/2015  CLINICAL DATA:  Acute onset of slurred speech and left-sided weakness. Intermittent vomiting. Initial encounter. EXAM: CT HEAD WITHOUT CONTRAST TECHNIQUE: Contiguous axial images were obtained from the base of the skull through the vertex without intravenous contrast. COMPARISON:  MRI/MRA of the head performed 05/04/2015, and CT of the head performed 05/06/2015 FINDINGS: There is no evidence of acute infarction, mass lesion, or intra- or extra-axial hemorrhage on CT. Prominence of the ventricles and sulci reflects mild to moderate cortical volume loss. Mild cerebellar atrophy is noted. Chronic encephalomalacia is noted at the right cerebellar hemisphere, reflecting remote infarct. Chronic ischemic change is noted at the basal ganglia bilaterally. Scattered periventricular and subcortical white matter change likely reflects small vessel ischemic microangiopathy. The brainstem and fourth ventricle are within normal limits. The cerebral hemispheres demonstrate grossly normal gray-white differentiation. No mass effect or midline shift is seen. There is no evidence of fracture; visualized osseous structures are unremarkable in appearance. The  visualized portions of the orbits are within normal limits. The paranasal sinuses and mastoid air cells are well-aerated. No significant soft tissue abnormalities are seen. IMPRESSION: 1. No acute intracranial pathology seen on CT. 2. Mild to moderate cortical volume loss and scattered small vessel ischemic microangiopathy. 3. Chronic encephalomalacia at the right cerebellar hemisphere, reflecting remote infarct. Chronic ischemic change at the basal ganglia bilaterally. Electronically Signed   By: Garald Balding M.D.   On: 09/03/2015 21:28   Mr Brain Wo Contrast  09/04/2015  CLINICAL DATA:  81 year old female status post right superior cerebellar infarct in August. Worsening right lower extremity weakness, slurred speech and facial droop. Initial encounter. EXAM: MRI HEAD WITHOUT CONTRAST TECHNIQUE: Multiplanar, multiecho pulse sequences of the brain and surrounding structures were obtained without intravenous contrast. COMPARISON:  Head CT 09/03/2015.  Brain MRI 05/04/2015, and earlier. FINDINGS: Petechial hemorrhage and developing encephalomalacia in the right cerebellum related to the August infarct. Mild mass effect on the fourth ventricle has resolved, now with mild ex vacuo enlargement. Major intracranial vascular flow voids are stable. No restricted diffusion or evidence of acute infarction. Outside of the right cerebellum stable gray and white matter signal with Patchy and confluent cerebral white matter and deep gray matter T2 heterogeneity. Chronic lacunar infarct of the left basal ganglia. No hemosiderin outside of the right cerebellum. No midline shift, mass effect, evidence of mass lesion, ventriculomegaly, extra-axial collection or acute intracranial hemorrhage. Cervicomedullary junction and pituitary are within normal limits. Negative visualized cervical  spine. Visible internal auditory structures appear normal. Mastoids are clear. Trace paranasal sinus mucosal thickening is stable. Orbit and  scalp soft tissues are stable. Normal bone marrow signal. IMPRESSION: 1. Evolution of the right cerebellar infarcts since August. Petechial hemorrhage has developed. Developing encephalomalacia with no posterior fossa mass effect. 2. No new intracranial abnormality. Elsewhere stable MRI appearance of the brain with chronic small vessel disease. Electronically Signed   By: Genevie Ann M.D.   On: 09/04/2015 08:56    Scheduled Meds: . aspirin  300 mg Rectal Daily   Or  . aspirin  325 mg Oral Daily  . atorvastatin  80 mg Oral q1800  . carvedilol  6.25 mg Oral BID WC  . cholecalciferol  1,000 Units Oral Daily  . dextrose      . DULoxetine  60 mg Oral Daily  . heparin  5,000 Units Subcutaneous 3 times per day  . insulin aspart  0-9 Units Subcutaneous TID WC  . lisinopril  5 mg Oral Daily  . pantoprazole (PROTONIX) IV  40 mg Intravenous Q24H   Continuous Infusions:   Principal Problem:   CVA (cerebral vascular accident) ([place]) Active Problems:   Hypertension   Diabetes mellitus ([place])   PMR (polymyalgia rheumatica) ([place])   Bronchiectasis ([place])   Slurred speech   Depression   Right leg weakness   Aphasia   Nausea   Hypokalemia   Stroke (cerebrum) ([place])    Time spent: 35 min    Kelvin Cellar  [facility] Pager [phone]. If 7PM-7AM, please contact night-coverage at [email or web address], password [facility] 09/04/2015, 3:39 PM  LOS: 1 day

## 2015-09-05 ENCOUNTER — Ambulatory Visit (INDEPENDENT_AMBULATORY_CARE_PROVIDER_SITE_OTHER): Payer: Medicare Other | Admitting: *Deleted

## 2015-09-05 DIAGNOSIS — I639 Cerebral infarction, unspecified: Secondary | ICD-10-CM

## 2015-09-05 DIAGNOSIS — M353 Polymyalgia rheumatica: Secondary | ICD-10-CM

## 2015-09-05 DIAGNOSIS — I6350 Cerebral infarction due to unspecified occlusion or stenosis of unspecified cerebral artery: Secondary | ICD-10-CM

## 2015-09-05 DIAGNOSIS — G9340 Encephalopathy, unspecified: Secondary | ICD-10-CM

## 2015-09-05 DIAGNOSIS — R5381 Other malaise: Secondary | ICD-10-CM | POA: Insufficient documentation

## 2015-09-05 DIAGNOSIS — E1159 Type 2 diabetes mellitus with other circulatory complications: Secondary | ICD-10-CM

## 2015-09-05 DIAGNOSIS — I6990 Unspecified sequelae of unspecified cerebrovascular disease: Secondary | ICD-10-CM

## 2015-09-05 DIAGNOSIS — I10 Essential (primary) hypertension: Secondary | ICD-10-CM

## 2015-09-05 DIAGNOSIS — I6930 Unspecified sequelae of cerebral infarction: Secondary | ICD-10-CM

## 2015-09-05 DIAGNOSIS — N390 Urinary tract infection, site not specified: Secondary | ICD-10-CM

## 2015-09-05 LAB — HEMOGLOBIN A1C
Hgb A1c MFr Bld: 6.3 % — ABNORMAL HIGH (ref 4.8–5.6)
Mean Plasma Glucose: 134 mg/dL

## 2015-09-05 LAB — GLUCOSE, CAPILLARY
GLUCOSE-CAPILLARY: 100 mg/dL — AB (ref 65–99)
GLUCOSE-CAPILLARY: 106 mg/dL — AB (ref 65–99)
GLUCOSE-CAPILLARY: 107 mg/dL — AB (ref 65–99)
GLUCOSE-CAPILLARY: 107 mg/dL — AB (ref 65–99)
GLUCOSE-CAPILLARY: 117 mg/dL — AB (ref 65–99)
Glucose-Capillary: 124 mg/dL — ABNORMAL HIGH (ref 65–99)
Glucose-Capillary: 98 mg/dL (ref 65–99)
Glucose-Capillary: 96 mg/dL (ref 65–99)

## 2015-09-05 LAB — CBC
HCT: 34.7 % — ABNORMAL LOW (ref 36.0–46.0)
HEMOGLOBIN: 10.7 g/dL — AB (ref 12.0–15.0)
MCH: 27.2 pg (ref 26.0–34.0)
MCHC: 30.8 g/dL (ref 30.0–36.0)
MCV: 88.1 fL (ref 78.0–100.0)
Platelets: 258 10*3/uL (ref 150–400)
RBC: 3.94 MIL/uL (ref 3.87–5.11)
RDW: 15.4 % (ref 11.5–15.5)
WBC: 8.7 10*3/uL (ref 4.0–10.5)

## 2015-09-05 LAB — BASIC METABOLIC PANEL
ANION GAP: 9 (ref 5–15)
BUN: 5 mg/dL — ABNORMAL LOW (ref 6–20)
CALCIUM: 8.3 mg/dL — AB (ref 8.9–10.3)
CHLORIDE: 111 mmol/L (ref 101–111)
CO2: 23 mmol/L (ref 22–32)
Creatinine, Ser: 0.72 mg/dL (ref 0.44–1.00)
GFR calc non Af Amer: >60 mL/min (ref >60)
Glucose, Bld: 116 mg/dL — ABNORMAL HIGH (ref 65–99)
Potassium: 3.6 mmol/L (ref 3.5–5.1)
Sodium: 143 mmol/L (ref 135–145)

## 2015-09-05 MED ORDER — WHITE PETROLATUM GEL
Status: AC
Start: 2015-09-05 — End: 2015-09-05
  Filled 2015-09-05: qty 1

## 2015-09-05 NOTE — Progress Notes (Signed)
STROKE TEAM PROGRESS NOTE   SUBJECTIVE (INTERVAL HISTORY) No family at the bedside. Pt more alert and awake today, states she is feeling better.   OBJECTIVE Temp:  [97 F (36.1 C)-98.7 F (37.1 C)] 98.7 F (37.1 C) (12/14 1020) Pulse Rate:  [56-78] 60 (12/14 1020) Cardiac Rhythm:  [-] Normal sinus rhythm (12/14 0700) Resp:  [16-18] 18 (12/14 1020) BP: (116-171)/(51-58) 136/58 mmHg (12/14 1020) SpO2:  [97 %-100 %] 98 % (12/14 1020)  CBC:   Recent Labs Lab 09/03/15 1711 09/05/15 0534  WBC 10.0 8.7  NEUTROABS 5.1  --   HGB 12.6 10.7*  HCT 39.4 34.7*  MCV 86.8 88.1  PLT 324 0000000    Basic Metabolic Panel:   Recent Labs Lab 09/03/15 1711 09/05/15 0534  NA 141 143  K 3.4* 3.6  CL 105 111  CO2 25 23  GLUCOSE 84 116*  BUN 8 5*  CREATININE 0.75 0.72  CALCIUM 9.2 8.3*    Lipid Panel:     Component Value Date/Time   CHOL 82 09/04/2015 1335   TRIG 81 09/04/2015 1335   HDL 29* 09/04/2015 1335   CHOLHDL 2.8 09/04/2015 1335   VLDL 16 09/04/2015 1335   LDLCALC 37 09/04/2015 1335   HgbA1c:  Lab Results  Component Value Date   HGBA1C 6.3* 09/04/2015   Urine Drug Screen:     Component Value Date/Time   LABOPIA NONE DETECTED 09/03/2015 2046   COCAINSCRNUR NONE DETECTED 09/03/2015 2046   LABBENZ NONE DETECTED 09/03/2015 2046   AMPHETMU NONE DETECTED 09/03/2015 2046   THCU NONE DETECTED 09/03/2015 2046   LABBARB NONE DETECTED 09/03/2015 2046      IMAGING  Ct Head Wo Contrast 09/03/2015   1. No acute intracranial pathology seen on CT. 2. Mild to moderate cortical volume loss and scattered small vessel ischemic microangiopathy. 3. Chronic encephalomalacia at the right cerebellar hemisphere, reflecting remote infarct. Chronic ischemic change at the basal ganglia bilaterally.   Mr Brain Wo Contrast 09/04/2015   1. Evolution of the right cerebellar infarcts since August. Petechial hemorrhage has developed. Developing encephalomalacia with no posterior fossa mass  effect. 2. No new intracranial abnormality. Elsewhere stable MRI appearance of the brain with chronic small vessel disease.   2D Echocardiogram  - Left ventricle: The cavity size was normal. There was moderateconcentric hypertrophy. Systolic function was vigorous. Theestimated ejection fraction was in the range of 65% to 70%. Wallmotion was normal; there were no regional wall motionabnormalities. There was an increased relative contribution ofatrial contraction to ventricular filling. Doppler parameters areconsistent with abnormal left ventricular relaxation (grade 1diastolic dysfunction). - Aortic valve: Trileaflet; mildly thickened, mildly calcifiedleaflets. - Mitral valve: Calcified annulus. - Atrial septum: There was increased thickness of the septum,consistent with lipomatous hypertrophy.  Chest Xray No acute cardiopulmonary disease.  EEG 1) Irregular slow activity 2) Slow PDR   PHYSICAL EXAM Elderly Caucasian lady not in distress. . Afebrile. Head is nontraumatic. Neck is supple without bruit.    Cardiac exam no murmur or gallop. Lungs are clear to auscultation. Distal pulses are well felt. Neurological Exam :  Drowsy but can be aroused. Dysarthric speech. Follows simple commands. Pupils irregular reactive. Extraocular movements are full range. Blinks to threat bilaterally. Fundi were not visualized. Vision acuity cannot be reliably tested. Mild right lower facial asymmetry. Tongue is midline. Motor system exam reveals mild right upper extremity weakness but cooperation is limited. Moves lower extremities well against gravity. Deep tendon reflexes are 1+ symmetric. Plantars downgoing. Sensation appears  preserved bilaterally. Gait was not tested.   ASSESSMENT/PLAN [name] is a 81 y.o. female with history of diabetes mellitus, hypertension, hyperlipidemia, GERD, depression, PMR, chronic back pain, remote right breast cancer, systolic congestive heart failure, previous stroke  on August 2016, cardiac loop recorder in place 05/08/15 presenting with worsening right leg weakness, slurred speech and facial droop. She did not receive IV t-PA due to beyond time window for treatment consideration as well as rapidly improved deficits.   Transient neurologic symptoms with dysarthria in setting of HA. EEG supports encephalopathic state, most likely from Remeron.  Started on REmeron 15 mg on Friday night, only took 2 doses. Likely cause of symptoms. Discontinued.  Resultant  Lethargic state, dysarthria  MRI  No acute stroke. ;etechial hemorrhage in old infarcts, normal evolution  MRA  Not ordered  2D Echo  No source of embolus. EF up to 65-70%.  Carotid 04/2015 B 40-59% stenosis (? Acoustic shadowing)  Has Loop recorder   UA unremarkable  EEG c/w encephalopathy  CXR Unremarkable   LDL 37  HgbA1c 6.3  Heparin 5000 units sq tid for VTE prophylaxis Diet Carb Modified Fluid consistency:: Thin; Room service appropriate?: Yes  aspirin 325 mg daily prior to admission, now on aspirin 325 mg daily. Continue at discharge  Ongoing aggressive stroke risk factor management  Therapy recommendations:  CIR  Disposition:  pending   Hypertension  Stable  Hyperlipidemia  Home meds:  lipitor 80mg  resumed in hospital  LDL 37, goal < 70  Continue statin at discharge  Diabetes  HgbA1c 6.3, goal < 7.0  Carotid stenosis, known, Asymptomatic  From Aug Admission  CUS showed bilateral 40-59% stenosis  CTA confirmed right ICA 75% stenosis and left ICA < 50%  Other Stroke Risk Factors  Hx stroke/TIA  04/2015 R superior cerebellar territory infarct secondary to either embolic source or large vessel stenosis  Family hx stroke (son and maternal aunt)  Other Active Problems  Depression  GERD  Hypokalemia  Nausea   NOTHING FURTHER TO ADD FROM THE STROKE STANDPOINT  Stroke Service will sign off. Please call should any needs arise.  Hospital day #  [place] for Pager information 09/05/2015 1:01 PM   I have personally examined this patient, reviewed notes, independently viewed imaging studies, participated in medical decision making and plan of care. I have made any additions or clarifications directly to the above note. Agree with note above.  Stroke service will sign off. Kindly call for questions.  Antony Contras, MD Medical Director [facility] Pager: [phone] 09/05/2015 7:19 PM   To contact Stroke Continuity provider, please refer to [email or web address] After hours, contact General Neurology

## 2015-09-05 NOTE — Progress Notes (Signed)
[facility] Summary Report / Loop Recorder 

## 2015-09-05 NOTE — Evaluation (Signed)
Speech Language Pathology Evaluation Patient Details Name: Cathy Taylor MRN: [id number] DOB: [birth year] Today's Date: 09/05/2015 Time: [id number] SLP Time Calculation (min) (ACUTE ONLY): 18 min  Problem List:  Patient Active Problem List   Diagnosis Date Noted  . Acute encephalopathy   . Depression 09/03/2015  . Right leg weakness 09/03/2015  . Aphasia 09/03/2015  . Nausea 09/03/2015  . Hypokalemia 09/03/2015  . Stroke (cerebrum) ([place]) 09/03/2015  . Slurred speech   . Elevated troponin 05/06/2015  . Cardiomyopathy ([place]) 05/06/2015  . Altered mental status 05/04/2015  . Vertigo 05/04/2015  . Acute ischemic stroke ([place]) 05/04/2015  . Encephalopathy acute   . CVA (cerebral vascular accident) ([place])   . Memory loss 06/01/2014  . Numbness 06/01/2014  . Osteoarthritis of left shoulder 01/06/2012  . Bronchiectasis ([place]) 10/10/2011  . Hyponatremia 09/12/2011  . Dyspnea 09/10/2011  . Hypertension 09/10/2011  . Diabetes mellitus ([place]) 09/10/2011  . PMR (polymyalgia rheumatica) ([place]) 09/10/2011  . Breast cancer ([place]) 09/10/2011   Past Medical History:  Past Medical History  Diagnosis Date  . Stress fracture of left femur with nonunion 07/08/2011  . PMR (polymyalgia rheumatica) ([facility])   . GERD (gastroesophageal reflux disease)   . Anxiety   . Hypertension   . Abdominal hernia   . Hypercholesteremia   . Heart murmur   . Chronic back pain greater than 3 months duration   . Bronchiectasis 11/12  . 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 Depression   . Bronchitis        . Type II diabetes mellitus ([place])   . Umbilical hernia [id number]    unrepaired  . Breast cancer ([place]) 1973    right  . Arthritis   . Osteoarthritis of left shoulder 01/06/2012  . Stroke ([place])   . Left ventricular dysfunction 04/2015    EF 30% by TEE, + AK mid-apical anteroseptal and anterior walls plus apex, neg bubble study, grade III plaque asc Ao, suspect ICM   Past Surgical History:  Past Surgical History   Procedure Laterality Date  . Tubal ligation    . Cataract extraction w/ intraocular lens implant  07/2011    left  . Shoulder arthroscopy  06/2000    left  . Femur surgery  07/07/2011    left rod placed  . Total shoulder arthroplasty  01/06/12    left  . Breast biopsy  1981    left  . Mastectomy  1973    right  . Total shoulder arthroplasty  01/06/2012    Procedure: TOTAL SHOULDER ARTHROPLASTY;  Surgeon: Johnny Bridge, MD;  Location: [facility];  Service: Orthopedics;  Laterality: Left;  . Ep implantable device N/A 05/08/2015    Procedure: Loop Recorder Insertion;  Surgeon: Will Meredith Leeds, MD;  Location: [facility];  Service: Cardiovascular;  Laterality: N/A;  . Tee without cardioversion N/A 05/07/2015    Procedure: TRANSESOPHAGEAL ECHOCARDIOGRAM (TEE);  Surgeon: Larey Dresser, MD;  EF 30%, +WMA, no thrombus/PFO, suspect ICM     HPI:  81 year old female admitted 09/03/15 due to RLE weakness and dysarthria. 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 MRI revealed evolution of right cerebellar infarcts since 04/2015.  SLP completed SLE 05/05/15, where pt scored 26/30 on MMSE.    Assessment / Plan / Recommendation Clinical Impression  Pt has a moderate dysarthria with reduced intelligibility of speech due to imprecise articulation, low volume, and hyponasality. Cognitively, she  is oriented only to person and requires Mod cueing for basic problem solving. Max cues provided for storage of new information, with increased difficulty due to limited sustained attention. She is able to verbalize some of her acute impairments, particularly her physical changes, but she does not show emergent or anticipatory awareness. Recommend acute SLP f/u as well as CIR upon discharge.    SLP Assessment  Patient needs continued Speech Lanaguage Pathology Services    Follow Up Recommendations  Inpatient Rehab    Frequency and Duration min 2x/week  2 weeks       SLP Evaluation Prior Functioning  Cognitive/Linguistic Baseline: Baseline deficits Baseline deficit details: previous CVA and memory loss per chart, unable to determine functional abilities Type of Home: House Available Help at Discharge: Family;Personal care attendant   Cognition  Overall Cognitive Status: No family/caregiver present to determine baseline cognitive functioning Arousal/Alertness: Awake/alert Orientation Level: Oriented to person;Disoriented to place;Disoriented to time;Disoriented to situation Attention: Sustained Sustained Attention: Impaired Sustained Attention Impairment: Verbal basic Memory: Impaired Memory Impairment: Storage deficit;Retrieval deficit;Decreased recall of new information Awareness: Impaired Awareness Impairment: Emergent impairment;Anticipatory impairment Problem Solving: Impaired Problem Solving Impairment: Verbal basic;Functional basic Safety/Judgment: Impaired    Comprehension  Auditory Comprehension Overall Auditory Comprehension: Impaired Commands: Impaired One Step Basic Commands: 75-100% accurate Complex Commands: 50-74% accurate Conversation: Simple    Expression Expression Primary Mode of Expression: Verbal Verbal Expression Overall Verbal Expression: Appears within functional limits for tasks assessed   Oral / Motor Motor Speech Overall Motor Speech: Impaired Respiration: Within functional limits Phonation: Low vocal intensity Resonance: Hyponasality Articulation: Impaired Level of Impairment: Conversation Intelligibility: Intelligibility reduced Conversation: 50-74% accurate Motor Planning: Witnin functional limits    Germain Osgood, M.A. CCC-SLP [phone]  Germain Osgood 09/05/2015, 9:54 AM

## 2015-09-05 NOTE — Consult Note (Addendum)
Physical Medicine and Rehabilitation Consult  Reason for Consult: Worsening of right sided weakness, right facial droop and slurred speech. Referring Physician:  Dr. Renne Crigler.    HPI: Cathy Taylor is a 81 y.o. female with history of DM type 2, PMR, chronic back pain, R-CAS, R-superior cerebellar infarct 04/2015 who was admitted on 09/03/15 with reports of difficulty walking for a couple of days with worsening of RLE weakness, right facial droop and slurred speech. MRI brain done revealing evolving right cerebellar infarcts with development of petechial hemorrhage and encephalomalacia and no evidence of acute infarct.  2D echo with EF 65-70% and no wall abnormality with grade one diastolic dysfunction and lipomatous atrial septal hypertrophy. Patient was noted to be lethargy and EEG done revealing generalized non specific cerebral dysfunction c/w encephalopathy.  Swallow evaluation limited by lethargy as well as N/V therefore NPO recommended till alertness improves. Questioned lethargy due to IV phenergan for N/V vs. UTI vs. Remeron. She was started on IV ceftriaxone for treatment. PT/OT/ST evaluations completed this am and patient showing great improvement in mentation. To start regular diet. CIR recommended for follow up therapy. Patient with resultant balance deficits with tendency to LOB posteriorly, difficulty with RLE clearance, moderate dysarthria as well as cognitive deficits affecting processing and safety.  Patient does not have 24/7 support on discharge.  Review of Systems  HENT: Negative for hearing loss.   Eyes: Negative for blurred vision and double vision.  Respiratory: Negative for cough and shortness of breath.   Cardiovascular: Negative for chest pain and palpitations.  Gastrointestinal: Positive for heartburn and abdominal pain ("spells of pain"). Negative for nausea and vomiting.  Musculoskeletal: Positive for joint pain (knee instability?) and falls (multiple times--does not  use walker consittently.).  Neurological: Positive for speech change and focal weakness. Negative for headaches.  Psychiatric/Behavioral: Positive for memory loss. The patient does not have insomnia.   All other systems reviewed and are negative.     Past Medical History  Diagnosis Date  . Stress fracture of left femur with nonunion 07/08/2011  . PMR (polymyalgia rheumatica) (HCC)   . GERD (gastroesophageal reflux disease)   . Anxiety   . Hypertension   . Abdominal hernia   . Hypercholesteremia   . Heart murmur   . Chronic back pain greater than 3 months duration   . Bronchiectasis 11/12  . 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 Depression   . Bronchitis        . Type II diabetes mellitus ([place])   . Umbilical hernia [id number]    unrepaired  . Breast cancer ([place]) 1973    right  . Arthritis   . Osteoarthritis of left shoulder 01/06/2012  . Stroke ([place])   . Left ventricular dysfunction 04/2015    EF 30% by TEE, + AK mid-apical anteroseptal and anterior walls plus apex, neg bubble study, grade III plaque asc Ao, suspect ICM    Past Surgical History  Procedure Laterality Date  . Tubal ligation    . Cataract extraction w/ intraocular lens implant  07/2011    left  . Shoulder arthroscopy  06/2000    left  . Femur surgery  07/07/2011    left rod placed  . Total shoulder arthroplasty  01/06/12    left  . Breast biopsy  1981    left  . Mastectomy  1973    right  . Total shoulder arthroplasty  01/06/2012    Procedure: TOTAL SHOULDER  ARTHROPLASTY;  Surgeon: Johnny Bridge, MD;  Location: [facility];  Service: Orthopedics;  Laterality: Left;  . Ep implantable device N/A 05/08/2015    Procedure: Loop Recorder Insertion;  Surgeon: Will Meredith Leeds, MD;  Location: [facility];  Service: Cardiovascular;  Laterality: N/A;  . Tee without cardioversion N/A 05/07/2015    Procedure: TRANSESOPHAGEAL ECHOCARDIOGRAM (TEE);  Surgeon: Larey Dresser, MD;  EF 30%, +WMA, no thrombus/PFO,  suspect ICM      Family History  Problem Relation Age of Onset  . Heart attack Father   . Stroke Son   . Stroke Maternal Aunt     Social History:   Married. Husband at nursing home in [place]? Used to work for [employer].  Reports she was at SNF before and has "someone coming in few times a week".  Was independent of ADL? Per  reports that she has never smoked. She quit smokeless tobacco use about 66 years ago. Her smokeless tobacco use included Snuff. She reports that she does not drink alcohol or use illicit drugs.  Allergies: No Known Allergies    Medications Prior to Admission  Medication Sig Dispense Refill  . acetaminophen (TYLENOL) 500 MG tablet Take 500 mg by mouth every 6 (six) hours as needed. For pain     . albuterol (PROVENTIL HFA;VENTOLIN HFA) 108 (90 BASE) MCG/ACT inhaler Inhale 2 puffs into the lungs every 6 (six) hours as needed. Shortness of breath    . aspirin 325 MG tablet Take 1 tablet (325 mg total) by mouth daily.    Marland Kitchen atorvastatin (LIPITOR) 80 MG tablet Take 1 tablet (80 mg total) by mouth daily at 6 PM.    . carvedilol (COREG) 6.25 MG tablet Take 1 tablet (6.25 mg total) by mouth 2 (two) times daily with a meal.    . Cholecalciferol (VITAMIN D-3 PO) Take 1,000 mg by mouth daily.     . diclofenac sodium (VOLTAREN) 1 % GEL Apply 1 application topically 4 (four) times daily as needed. Pain in shoulder    . DULoxetine (CYMBALTA) 60 MG capsule Take 1 capsule (60 mg total) by mouth daily. 30 capsule 5  . esomeprazole (NEXIUM) 20 MG capsule Take 20 mg by mouth 2 (two) times daily before a meal.    . glimepiride (AMARYL) 1 MG tablet Take 1 mg by mouth daily with breakfast.    . lisinopril (PRINIVIL,ZESTRIL) 5 MG tablet Take 1 tablet (5 mg total) by mouth daily. 90 tablet 3  . metFORMIN (GLUCOPHAGE) 500 MG tablet Take 1,000 mg by mouth 2 (two) times daily. [employer] papers states 1000 mg tabs twice a day.. Pt states pill are 500 mg 2 in am 1 in pm       Home: Home Living Family/patient expects to be discharged to:: Private residence Living Arrangements: Spouse/significant other (pt states husband is in a nursing home right now) Available Help at Discharge: Family, Personal care attendant Type of Home: House Additional Comments: Difficulty obtaining information due to aphasia and cognition. Pt states she lives with her husband but he is in a nursing home currently. Pt reports someone came in at home to assist her for a few hours but states she was able to perform ADL's independently. Pt reports 2 level home with her able to stay on first level. Unable to verbalize home entry. No family present to confirm or expand upon information  Functional History: Prior Function Level of Independence: Independent with assistive device(s) Comments: Pt reports she  used RW at home Functional Status:  Mobility: Bed Mobility Overal bed mobility: Needs Assistance Bed Mobility: Supine to Sit Rolling: Max assist Sidelying to sit: +2 for physical assistance, Min assist Supine to sit: Min assist, HOB elevated General bed mobility comments: extra time for mobility and requiring min assist to get trunk into full upright position seated EOB. Cues for scooting and min assist needed to bring hips forward. Tendency for posterior/L lateral lean Transfers Overall transfer level: Needs assistance Equipment used: Rolling walker (2 wheeled) Transfers: Sit to/from Stand, [employer] Transfers Sit to Stand: Mod assist Stand pivot transfers: Min assist General transfer comment: Cues for hand placement and sequencing. Min to mod assist for initial sit to stand. Extra time for transfer and cues for increasing clearance of RLE during transfer Ambulation/Gait Ambulation/Gait assistance: Min assist Ambulation Distance (Feet): 10 Feet Assistive device: Rolling walker (2 wheeled) Gait Pattern/deviations: Step-to pattern, Decreased step length - right, Decreased  dorsiflexion - right, Trunk flexed General Gait Details: cues for R foot clearance and upright posture. min assist for balance needed during turns or retro gait. extra time for processing    ADL: ADL Overall ADL's : Needs assistance/impaired Toilet Transfer: Moderate assistance, +2 for physical assistance, Stand-pivot Toilet Transfer Details (indicate cue type and reason): Simulated transfer to chair General ADL Comments: Pt very lethargic on OT arrival. Progressed to sitting EOB and completed stand-pivot transfer with mod +2 hand held assist. Pt more alert and conversive once sitting up in chair.  Cognition: Cognition Overall Cognitive Status: No family/caregiver present to determine baseline cognitive functioning Orientation Level: Oriented to person, Disoriented to place, Disoriented to time, Disoriented to situation Cognition Arousal/Alertness: Awake/alert Behavior During Therapy: WFL for tasks assessed/performed Overall Cognitive Status: No family/caregiver present to determine baseline cognitive functioning Area of Impairment: Orientation, Memory, Problem solving, Safety/judgement Orientation Level: Disoriented to, Place, Time, Situation Current Attention Level: Sustained Memory: Decreased short-term memory Following Commands: Follows one step commands consistently, Follows multi-step commands inconsistently Safety/Judgement: Decreased awareness of deficits, Decreased awareness of safety Awareness: Intellectual Problem Solving: Difficulty sequencing, Requires verbal cues, Slow processing General Comments: Chart indicates history of memory loss and previous CVA. Unable to confirm with family prior level of cognition.  Blood pressure 140/55, pulse 58, temperature 98.1 F (36.7 C), temperature source Oral, resp. rate 18, height 5\' 4"  (1.626 m), weight 62.642 kg (138 lb 1.6 oz), SpO2 98 %. Physical Exam  Nursing note and vitals reviewed. Constitutional: She appears well-developed  and well-nourished.  HENT:  Head: Normocephalic and atraumatic.  Eyes: Conjunctivae and EOM are normal. Pupils are equal, round, and reactive to light.  Neck: Normal range of motion. Neck supple.  Cardiovascular: Normal rate and regular rhythm.   Respiratory: Effort normal and breath sounds normal. No respiratory distress. She has no wheezes.  GI: Soft. Bowel sounds are normal. She exhibits no distension. There is no tenderness.  Musculoskeletal: She exhibits no edema or tenderness.  PROM WNL  Neurological: She is alert. No cranial nerve deficit.  Alert and Oriented to self and place. Lacks insight and awareness of deficits.  Confusion and perseveration needing redirection.   Had difficulty following one step motor commands without visual and tactile cues.  RUE: Dysmetria tree, apraxia, dysdiadochokinesia LUE:: 4+/5 proximal to distal B/l LE: 4/5 proximal to distal Sensation intact to light touch DTRs: ?3+RUE/RLE  Skin: Skin is warm and dry.  Psychiatric: Her speech is tangential. Cognition and memory are impaired. She expresses inappropriate judgment.    Results for  orders placed or performed during the hospital encounter of 09/03/15 (from the past 24 hour(s))  Glucose, capillary     Status: None   Collection Time: 09/04/15  9:34 AM  Result Value Ref Range   Glucose-Capillary 71 65 - 99 mg/dL  Glucose, capillary     Status: Abnormal   Collection Time: 09/04/15 11:37 AM  Result Value Ref Range   Glucose-Capillary 61 (L) 65 - 99 mg/dL   Comment 1 Notify RN    Comment 2 Document in Chart   Glucose, capillary     Status: Abnormal   Collection Time: 09/04/15  1:27 PM  Result Value Ref Range   Glucose-Capillary 127 (H) 65 - 99 mg/dL  Hemoglobin A1c     Status: Abnormal   Collection Time: 09/04/15  1:34 PM  Result Value Ref Range   Hgb A1c MFr Bld 6.3 (H) 4.8 - 5.6 %   Mean Plasma Glucose 134 mg/dL  Lipid panel     Status: Abnormal   Collection Time: 09/04/15  1:35 PM  Result  Value Ref Range   Cholesterol 82 0 - 200 mg/dL   Triglycerides 81 <150 mg/dL   HDL 29 (L) >40 mg/dL   Total CHOL/HDL Ratio 2.8 RATIO   VLDL 16 0 - 40 mg/dL   LDL Cholesterol 37 0 - 99 mg/dL  Glucose, capillary     Status: None   Collection Time: 09/04/15  5:06 PM  Result Value Ref Range   Glucose-Capillary 66 65 - 99 mg/dL  Glucose, capillary     Status: Abnormal   Collection Time: 09/04/15  6:39 PM  Result Value Ref Range   Glucose-Capillary 20 (LL) 65 - 99 mg/dL  Glucose, capillary     Status: Abnormal   Collection Time: 09/04/15  7:01 PM  Result Value Ref Range   Glucose-Capillary 137 (H) 65 - 99 mg/dL  Glucose, capillary     Status: Abnormal   Collection Time: 09/04/15  8:03 PM  Result Value Ref Range   Glucose-Capillary 156 (H) 65 - 99 mg/dL   Comment 1 Notify RN    Comment 2 Document in Chart   Glucose, capillary     Status: Abnormal   Collection Time: 09/04/15 10:41 PM  Result Value Ref Range   Glucose-Capillary 106 (H) 65 - 99 mg/dL   Comment 1 Notify RN    Comment 2 Document in Chart   Glucose, capillary     Status: Abnormal   Collection Time: 09/05/15 12:07 AM  Result Value Ref Range   Glucose-Capillary 107 (H) 65 - 99 mg/dL   Comment 1 Notify RN    Comment 2 Document in Chart   Glucose, capillary     Status: Abnormal   Collection Time: 09/05/15  2:08 AM  Result Value Ref Range   Glucose-Capillary 106 (H) 65 - 99 mg/dL   Comment 1 Notify RN    Comment 2 Document in Chart   Glucose, capillary     Status: Abnormal   Collection Time: 09/05/15  4:03 AM  Result Value Ref Range   Glucose-Capillary 117 (H) 65 - 99 mg/dL   Comment 1 Notify RN    Comment 2 Document in Chart   Basic metabolic panel     Status: Abnormal   Collection Time: 09/05/15  5:34 AM  Result Value Ref Range   Sodium 143 135 - 145 mmol/L   Potassium 3.6 3.5 - 5.1 mmol/L   Chloride 111 101 - 111 mmol/L   CO2  23 22 - 32 mmol/L   Glucose, Bld 116 (H) 65 - 99 mg/dL   BUN 5 (L) 6 - 20 mg/dL    Creatinine, Ser 0.72 0.44 - 1.00 mg/dL   Calcium 8.3 (L) 8.9 - 10.3 mg/dL   GFR calc non Af Amer >60 >60 mL/min   GFR calc Af Amer >60 >60 mL/min   Anion gap 9 5 - 15  CBC     Status: Abnormal   Collection Time: 09/05/15  5:34 AM  Result Value Ref Range   WBC 8.7 4.0 - 10.5 K/uL   RBC 3.94 3.87 - 5.11 MIL/uL   Hemoglobin 10.7 (L) 12.0 - 15.0 g/dL   HCT 34.7 (L) 36.0 - 46.0 %   MCV 88.1 78.0 - 100.0 fL   MCH 27.2 26.0 - 34.0 pg   MCHC 30.8 30.0 - 36.0 g/dL   RDW 15.4 11.5 - 15.5 %   Platelets 258 150 - 400 K/uL  Glucose, capillary     Status: Abnormal   Collection Time: 09/05/15  6:29 AM  Result Value Ref Range   Glucose-Capillary 107 (H) 65 - 99 mg/dL   Comment 1 Notify RN    Comment 2 Document in Chart   Glucose, capillary     Status: Abnormal   Collection Time: 09/05/15  7:55 AM  Result Value Ref Range   Glucose-Capillary 124 (H) 65 - 99 mg/dL   Ct Head Wo Contrast  09/03/2015  CLINICAL DATA:  Acute onset of slurred speech and left-sided weakness. Intermittent vomiting. Initial encounter. EXAM: CT HEAD WITHOUT CONTRAST TECHNIQUE: Contiguous axial images were obtained from the base of the skull through the vertex without intravenous contrast. COMPARISON:  MRI/MRA of the head performed 05/04/2015, and CT of the head performed 05/06/2015 FINDINGS: There is no evidence of acute infarction, mass lesion, or intra- or extra-axial hemorrhage on CT. Prominence of the ventricles and sulci reflects mild to moderate cortical volume loss. Mild cerebellar atrophy is noted. Chronic encephalomalacia is noted at the right cerebellar hemisphere, reflecting remote infarct. Chronic ischemic change is noted at the basal ganglia bilaterally. Scattered periventricular and subcortical white matter change likely reflects small vessel ischemic microangiopathy. The brainstem and fourth ventricle are within normal limits. The cerebral hemispheres demonstrate grossly normal gray-white differentiation. No mass  effect or midline shift is seen. There is no evidence of fracture; visualized osseous structures are unremarkable in appearance. The visualized portions of the orbits are within normal limits. The paranasal sinuses and mastoid air cells are well-aerated. No significant soft tissue abnormalities are seen. IMPRESSION: 1. No acute intracranial pathology seen on CT. 2. Mild to moderate cortical volume loss and scattered small vessel ischemic microangiopathy. 3. Chronic encephalomalacia at the right cerebellar hemisphere, reflecting remote infarct. Chronic ischemic change at the basal ganglia bilaterally. Electronically Signed   By: Garald Balding M.D.   On: 09/03/2015 21:28   Mr Brain Wo Contrast  09/04/2015  CLINICAL DATA:  81 year old female status post right superior cerebellar infarct in August. Worsening right lower extremity weakness, slurred speech and facial droop. Initial encounter. EXAM: MRI HEAD WITHOUT CONTRAST TECHNIQUE: Multiplanar, multiecho pulse sequences of the brain and surrounding structures were obtained without intravenous contrast. COMPARISON:  Head CT 09/03/2015.  Brain MRI 05/04/2015, and earlier. FINDINGS: Petechial hemorrhage and developing encephalomalacia in the right cerebellum related to the August infarct. Mild mass effect on the fourth ventricle has resolved, now with mild ex vacuo enlargement. Major intracranial vascular flow voids are stable. No restricted diffusion  or evidence of acute infarction. Outside of the right cerebellum stable gray and white matter signal with Patchy and confluent cerebral white matter and deep gray matter T2 heterogeneity. Chronic lacunar infarct of the left basal ganglia. No hemosiderin outside of the right cerebellum. No midline shift, mass effect, evidence of mass lesion, ventriculomegaly, extra-axial collection or acute intracranial hemorrhage. Cervicomedullary junction and pituitary are within normal limits. Negative visualized cervical spine.  Visible internal auditory structures appear normal. Mastoids are clear. Trace paranasal sinus mucosal thickening is stable. Orbit and scalp soft tissues are stable. Normal bone marrow signal. IMPRESSION: 1. Evolution of the right cerebellar infarcts since August. Petechial hemorrhage has developed. Developing encephalomalacia with no posterior fossa mass effect. 2. No new intracranial abnormality. Elsewhere stable MRI appearance of the brain with chronic small vessel disease. Electronically Signed   By: Genevie Ann M.D.   On: 09/04/2015 08:56   Dg Chest Port 1 View  09/04/2015  CLINICAL DATA:  Confusion. History of hypertension, diabetes and stroke as well as ventricular dysfunction. EXAM: PORTABLE CHEST 1 VIEW COMPARISON:  05/04/2015 FINDINGS: Cardiac silhouette normal in size and configuration. No mediastinal or hilar masses or evidence of adenopathy. Clear lungs. No pleural effusion or pneumothorax. Left shoulder prosthesis is well-seated and aligned. Bony thorax is diffusely demineralized. IMPRESSION: No acute cardiopulmonary disease. Electronically Signed   By: Lajean Manes M.D.   On: 09/04/2015 16:58    Assessment/Plan: Diagnosis: Debility/Encephalopathy Labs and images independently reviewed.  Records reviewed and summated above. Evolving stroke: Continue secondary stroke prophylaxis and Risk Factor Modification listed below:   Antiplatelet therapy Blood Pressure Management:  Continue current medication with prn's with permisive HTN per primary team Statin Agent Diabetes management Right sided ataxia: Consider wrist weight to improve function and coordination  1. Does the need for close, 24 hr/day medical supervision in concert with the patient's rehab needs make it unreasonable for this patient to be served in a less intensive setting? No  2. Co-Morbidities requiring supervision/potential complications: DM type 2 (Monitor in accordance with exercise and adjust meds as necessary), PMR (cont to  monitor for pain), chronic back pain (Biofeedback training with therapies to help reduce reliance on opiate pain medications, monitor pain control during therapies, and sedation at rest and titrate to maximum efficacy to ensure participation and gains in therapies), R-superior cerebellar infarct, HTN (monitor and provide prns in accordance with increased physical exertion and pain), UTI (continue meds) 3. Due to safety, disease management, medication administration and patient education, does the patient require 24 hr/day rehab nursing? Yes 4. Does the patient require coordinated care of a physician, rehab nurse, PT (1-2 hrs/day, 5 days/week), OT (1-2 hrs/day, 5 days/week) and SLP (1-2 hrs/day, 5 days/week) to address physical and functional deficits in the context of the above medical diagnosis(es)? Potentially Addressing deficits in the following areas: balance, endurance, locomotion, strength, transferring, bowel/bladder control, feeding, grooming, toileting, cognition and psychosocial support 5. Can the patient actively participate in an intensive therapy program of at least 3 hrs of therapy per day at least 5 days per week? Yes 6. The potential for patient to make measurable gains while on inpatient rehab is good 7. Anticipated functional outcomes upon discharge from inpatient rehab are n/a  with PT, n/a with OT, n/a with SLP. 8. Estimated rehab length of stay to reach the above functional goals is: N/A 9. Does the patient have adequate social supports and living environment to accommodate these discharge functional goals? N/A 10. Anticipated D/C setting: Other  11. Anticipated post D/C treatments: HH therapy and Home excercise program 12. Overall Rehab/Functional Prognosis: good  RECOMMENDATIONS: This patient's condition is appropriate for continued rehabilitative care in the following setting: Pt does not have 24/7 support on discharge and will unlikely be able to obtain an independent level of  functioning after a short IRF stay.  Pt has stayed in a SNF prior to going home and where her husband is currently located.  Would recommend SNF. Patient has agreed to participate in recommended program. Potentially Note that insurance prior authorization may be required for reimbursement for recommended care.  Comment: Rehab Admissions Coordinator to follow up.  Delice Lesch, MD 09/05/2015

## 2015-09-05 NOTE — Evaluation (Signed)
Physical Therapy Evaluation Patient Details Name: Cathy Taylor MRN: [id number] DOB: 1932-11-05 Today's Date: 09/05/2015   History of Present Illness  81 y.o. female admitted with worsening R leg wekaness, slurred speech, and facial droop. PMH significant for memory loss, CVA (2016), HTN, hyperlipidemia, DM, GERD, depression, PMR, chronic back pain, remote R breast cancer, systolic CHF, cardiac loop recorder. CT and MRI (-) for acute intracranial abnormalities.    Clinical Impression  Pt admitted with above diagnosis. Pt currently with functional limitations due to the deficits listed below (see PT Problem List). Pt currently requires overall mod physical assist using RW for mobility. Pt will benefit from skilled PT to increase their independence and safety with mobility. Recommend comprehensive inpatient rehab (CIR) for post-acute therapy needs to progress patient to safe discharge level.       Follow Up Recommendations CIR;Supervision/Assistance - 24 hour    Equipment Recommendations  None recommended by PT    Recommendations for Other Services Rehab consult     Precautions / Restrictions Precautions Precautions: Fall Restrictions Weight Bearing Restrictions: No      Mobility  Bed Mobility Overal bed mobility: Needs Assistance Bed Mobility: Supine to Sit     Supine to sit: Min assist;HOB elevated     General bed mobility comments: extra time for mobility and requiring min assist to get trunk into full upright position seated EOB. Cues for scooting and min assist needed to bring hips forward. Tendency for posterior/L lateral lean  Transfers Overall transfer level: Needs assistance Equipment used: Rolling walker (2 wheeled) Transfers: Sit to/from [facility] Sit to Stand: Mod assist Stand pivot transfers: Min assist       General transfer comment: Cues for hand placement and sequencing. Min to mod assist for initial sit to stand. Extra time for transfer  and cues for increasing clearance of RLE during transfer  Ambulation/Gait Ambulation/Gait assistance: Min assist Ambulation Distance (Feet): 10 Feet Assistive device: Rolling walker (2 wheeled) Gait Pattern/deviations: Step-to pattern;Decreased step length - right;Decreased dorsiflexion - right;Trunk flexed     General Gait Details: cues for R foot clearance and upright posture. min assist for balance needed during turns or retro gait. extra time for processing  Stairs            Wheelchair Mobility    Modified Rankin (Stroke Patients Only) Modified Rankin (Stroke Patients Only) Pre-Morbid Rankin Score: Moderate disability (based off pt report which is inconsistent) Modified Rankin: Moderately severe disability     Balance Overall balance assessment: Needs assistance Sitting-balance support: Bilateral upper extremity supported;Single extremity supported;Feet supported Sitting balance-Leahy Scale: Poor Sitting balance - Comments: tendency for posterior LOB and slight left lateral lean   Standing balance support: Bilateral upper extremity supported;During functional activity Standing balance-Leahy Scale: Poor Standing balance comment: initially posterior lean in standing but with cues able to reorient to midline and maintain with min assist.                             Pertinent Vitals/Pain Pain Assessment: No/denies pain    Home Living Family/patient expects to be discharged to:: Private residence Living Arrangements: Spouse/significant other (pt states husband is in a nursing home right now) Available Help at Discharge: Family;Personal care attendant Type of Home: House           Additional Comments: Difficulty obtaining information due to aphasia and cognition. Pt states she lives with her husband but he is in a nursing  home currently. Pt reports someone came in at home to assist her for a few hours but states she was able to perform ADL's  independently. Pt reports 2 level home with her able to stay on first level. Unable to verbalize home entry. No family present to confirm or expand upon information    Prior Function Level of Independence: Independent with assistive device(s)         Comments: Pt reports she used RW at home     Hand Dominance        Extremity/Trunk Assessment   Upper Extremity Assessment: Defer to OT evaluation           Lower Extremity Assessment: Generalized weakness;RLE deficits/detail RLE Deficits / Details: decreased clearance of RLE during gait and transfers. Inconsistent reporting with sensation testing.  Able to move grossly 3/5    Cervical / Trunk Assessment: Kyphotic  Communication   Communication: Receptive difficulties;Expressive difficulties  Cognition Arousal/Alertness: Awake/alert Behavior During Therapy: WFL for tasks assessed/performed Overall Cognitive Status: No family/caregiver present to determine baseline cognitive functioning Area of Impairment: Orientation;Memory;Problem solving;Safety/judgement Orientation Level: Disoriented to;Place;Time;Situation Current Attention Level: Sustained Memory: Decreased short-term memory Following Commands: Follows one step commands consistently;Follows multi-step commands inconsistently Safety/Judgement: Decreased awareness of deficits;Decreased awareness of safety   Problem Solving: Difficulty sequencing;Requires verbal cues;Slow processing General Comments: Chart indicates history of memory loss and previous CVA. Unable to confirm with family prior level of cognition.    General Comments General comments (skin integrity, edema, etc.): room air sats 94% and HR = 92 bpm    Exercises        Assessment/Plan    PT Assessment Patient needs continued PT services  PT Diagnosis Difficulty walking;Generalized weakness;Hemiplegia dominant side;Altered mental status   PT Problem List Decreased strength;Decreased activity  tolerance;Decreased balance;Decreased range of motion;Decreased mobility;Decreased coordination;Decreased cognition;Decreased knowledge of use of DME;Decreased safety awareness;Cardiopulmonary status limiting activity;Impaired sensation  PT Treatment Interventions DME instruction;Gait training;Stair training;Functional mobility training;Therapeutic activities;Therapeutic exercise;Balance training;Neuromuscular re-education;Cognitive remediation;Patient/family education;Wheelchair mobility training   PT Goals (Current goals can be found in the Care Plan section) Acute Rehab PT Goals Patient Stated Goal: go home eventually PT Goal Formulation: With patient Time For Goal Achievement: 09/19/15 Potential to Achieve Goals: Good    Frequency Min 4X/week   Barriers to discharge Other (comment) (unclear what home situation is and what assist available) Pt difficulty with reporting due to aphasia and cognition. No family present to provide details.    Co-evaluation               End of Session Equipment Utilized During Treatment: Gait belt Activity Tolerance: Patient tolerated treatment well Patient left: in chair;with call bell/phone within reach;with chair alarm set Nurse Communication: Mobility status         Time: [id number] PT Time Calculation (min) (ACUTE ONLY): 22 min   Charges:   PT Evaluation $Initial PT Evaluation Tier I: 1 Procedure PT Treatments $Therapeutic Activity: 8-22 mins   PT G Codes:        Canary Brim Ivory Broad, PT, DPT Pager #: [phone]  09/05/2015, 8:51 AM

## 2015-09-05 NOTE — Progress Notes (Signed)
PROGRESS NOTE  Cathy Taylor [id number] DOB: 1933/07/12 DOA: 09/03/2015 PCP: Kandice Hams, MD   HPI: Cathy Taylor is a pleasant 81 year old female with a history of hypertension, diabetes mellitus, having a stroke in August 2016 at which time she presented with dysarthria, facial droop. She was brought to the emergency department for worsening right leg weakness and slurred speech. Initial workup included a CT scan of brain which was negative. Overnight she had multiple episodes of nausea vomiting for she was given Phenergan. He was admitted to the hospital and placed on the stroke protocol. MRI of brain performed today did not show an acute stroke. There was evolution of right cerebellar infarcts that have been present since August. During my encounter patient was sedated, difficult to arouse which I suspect was related to sedatives she had received overnight. It was difficult to perform neurologic examination. Neurology was consulted during this hospitalization  Subjective / 24 H Interval events - feels better today - no chest pain, shortness of breath, no abdominal pain, nausea or vomiting.   Assessment/Plan: Principal Problem:   CVA (cerebral vascular accident) ([place]) Active Problems:   Hypertension   Diabetes mellitus ([place])   PMR (polymyalgia rheumatica) ([facility])   Bronchiectasis ([facility])   Slurred speech   Depression   Right leg weakness   Aphasia   Nausea   Hypokalemia   Stroke (cerebrum) ([facility])   Acute encephalopathy   Dysarthria/slurred speech/right leg weakness - Cathy. Taylor is an 81 year old female with a history of CVA back in August 2016, presented with dysarthria, worsening right leg weakness and facial droop. - Initial workup included a CT scan of brain did not reveal acute intracranial abnormality. - She was placed on CVA protocol and admitted to telemetry. - MRI of brain performed on 09/04/2015 did not show acute CVA. Radiology mention evolution of the right cerebellar  infarct since August. - Transthoracic echocardiogram showed EF of 65-70% - She was seen and evaluated by the stroke team who recommended obtaining EEG, chest x-ray, urinalysis with the discontinuation of Remeron. EEG unremarkable, CXR without significant findings - Will continue antiplatelet therapy with aspirin 325 mg by mouth daily and statin on discharge  Altered mental status/acute encephalopathy. - improving - She was administered IV Phenergan overnight prior for multiple episodes of nausea and vomiting may be related to that - Workup included a urinalysis which did reveal the presence of many bacteria. Started ceftriaxone 1 g IV every 24 hours the event that this may be contributing to encephalopathy. Improving today, continue antibiotics   Hypertension. - Blood pressure stable - Continue carvedilol 6.25 mg by mouth twice a day and lisinopril 5 mg by mouth daily  Urinary tract infection. - Urinalysis showed the presence of many bacteria with WBCs. - Plan to treat empirically with ceftriaxone 1 g IV every 24 hours given presence of encephalopathy. - urine cultures not obtained  History of diabetes. - resume diet today  Diet: Diet Carb Modified Fluid consistency:: Thin; Room service appropriate?: Yes Fluids: none DVT Prophylaxis: heparin   Code Status: DNR Family Communication: no family bedside  Disposition Plan: CIR vs SNF  Barriers to discharge: CIR evaluating  Consultants:  Neurology   Procedures:  2D echo   Antibiotics  Anti-infectives    Start     Dose/Rate Route Frequency Ordered Stop   09/04/15 1745  cefTRIAXone (ROCEPHIN) 1 g in dextrose 5 % 50 mL IVPB     1 g 100 mL/hr over 30 Minutes Intravenous Every 24 hours  09/04/15 1738         Studies  Ct Head Wo Contrast  09/03/2015  CLINICAL DATA:  Acute onset of slurred speech and left-sided weakness. Intermittent vomiting. Initial encounter. EXAM: CT HEAD WITHOUT CONTRAST TECHNIQUE: Contiguous axial  images were obtained from the base of the skull through the vertex without intravenous contrast. COMPARISON:  MRI/MRA of the head performed 05/04/2015, and CT of the head performed 05/06/2015 FINDINGS: There is no evidence of acute infarction, mass lesion, or intra- or extra-axial hemorrhage on CT. Prominence of the ventricles and sulci reflects mild to moderate cortical volume loss. Mild cerebellar atrophy is noted. Chronic encephalomalacia is noted at the right cerebellar hemisphere, reflecting remote infarct. Chronic ischemic change is noted at the basal ganglia bilaterally. Scattered periventricular and subcortical white matter change likely reflects small vessel ischemic microangiopathy. The brainstem and fourth ventricle are within normal limits. The cerebral hemispheres demonstrate grossly normal gray-white differentiation. No mass effect or midline shift is seen. There is no evidence of fracture; visualized osseous structures are unremarkable in appearance. The visualized portions of the orbits are within normal limits. The paranasal sinuses and mastoid air cells are well-aerated. No significant soft tissue abnormalities are seen. IMPRESSION: 1. No acute intracranial pathology seen on CT. 2. Mild to moderate cortical volume loss and scattered small vessel ischemic microangiopathy. 3. Chronic encephalomalacia at the right cerebellar hemisphere, reflecting remote infarct. Chronic ischemic change at the basal ganglia bilaterally. Electronically Signed   By: Garald Balding M.D.   On: 09/03/2015 21:28   Mr Brain Wo Contrast  09/04/2015  CLINICAL DATA:  81 year old female status post right superior cerebellar infarct in August. Worsening right lower extremity weakness, slurred speech and facial droop. Initial encounter. EXAM: MRI HEAD WITHOUT CONTRAST TECHNIQUE: Multiplanar, multiecho pulse sequences of the brain and surrounding structures were obtained without intravenous contrast. COMPARISON:  Head CT  09/03/2015.  Brain MRI 05/04/2015, and earlier. FINDINGS: Petechial hemorrhage and developing encephalomalacia in the right cerebellum related to the August infarct. Mild mass effect on the fourth ventricle has resolved, now with mild ex vacuo enlargement. Major intracranial vascular flow voids are stable. No restricted diffusion or evidence of acute infarction. Outside of the right cerebellum stable gray and white matter signal with Patchy and confluent cerebral white matter and deep gray matter T2 heterogeneity. Chronic lacunar infarct of the left basal ganglia. No hemosiderin outside of the right cerebellum. No midline shift, mass effect, evidence of mass lesion, ventriculomegaly, extra-axial collection or acute intracranial hemorrhage. Cervicomedullary junction and pituitary are within normal limits. Negative visualized cervical spine. Visible internal auditory structures appear normal. Mastoids are clear. Trace paranasal sinus mucosal thickening is stable. Orbit and scalp soft tissues are stable. Normal bone marrow signal. IMPRESSION: 1. Evolution of the right cerebellar infarcts since August. Petechial hemorrhage has developed. Developing encephalomalacia with no posterior fossa mass effect. 2. No new intracranial abnormality. Elsewhere stable MRI appearance of the brain with chronic small vessel disease. Electronically Signed   By: Genevie Ann M.D.   On: 09/04/2015 08:56   Dg Chest Port 1 View  09/04/2015  CLINICAL DATA:  Confusion. History of hypertension, diabetes and stroke as well as ventricular dysfunction. EXAM: PORTABLE CHEST 1 VIEW COMPARISON:  05/04/2015 FINDINGS: Cardiac silhouette normal in size and configuration. No mediastinal or hilar masses or evidence of adenopathy. Clear lungs. No pleural effusion or pneumothorax. Left shoulder prosthesis is well-seated and aligned. Bony thorax is diffusely demineralized. IMPRESSION: No acute cardiopulmonary disease. Electronically Signed   By:  Lajean Manes  M.D.   On: 09/04/2015 16:58    Objective  Filed Vitals:   09/05/15 0306 09/05/15 0455 09/05/15 1020 09/05/15 1434  BP: 171/55 140/55 136/58 148/62  Pulse: 78 58 60 61  Temp: 97 F (36.1 C) 98.1 F (36.7 C) 98.7 F (37.1 C) 98.3 F (36.8 C)  TempSrc: Oral Oral Oral Oral  Resp: 18 18 18 18   Height:      Weight:      SpO2: 97% 98% 98% 98%   No intake or output data in the 24 hours ending 09/05/15 1537 Filed Weights   09/04/15 0009  Weight: 62.642 kg (138 lb 1.6 oz)    Exam:  GENERAL: NAD  HEENT: no scleral icterus, PERRL  NECK: supple, no LAD  LUNGS: CTA biL, no wheezing  HEART: RRR without MRG  ABDOMEN: soft, non tender  EXTREMITIES: no clubbing / cyanosis  NEUROLOGIC: non focal, slight right sided weakness, slurred speech  PSYCHIATRIC: normal mood and affect  SKIN: no rashes  Data Reviewed: Basic Metabolic Panel:  Recent Labs Lab 09/03/15 1711 09/05/15 0534  NA 141 143  K 3.4* 3.6  CL 105 111  CO2 25 23  GLUCOSE 84 116*  BUN 8 5*  CREATININE 0.75 0.72  CALCIUM 9.2 8.3*   Liver Function Tests:  Recent Labs Lab 09/03/15 1711  AST 17  ALT 11*  ALKPHOS 72  BILITOT 1.0  PROT 6.8  ALBUMIN 3.3*    Recent Labs Lab 09/03/15 1711  LIPASE 30   CBC:  Recent Labs Lab 09/03/15 1711 09/05/15 0534  WBC 10.0 8.7  NEUTROABS 5.1  --   HGB 12.6 10.7*  HCT 39.4 34.7*  MCV 86.8 88.1  PLT 324 258   Cardiac Enzymes:  Recent Labs Lab 09/03/15 1711  TROPONINI <0.03   CBG:  Recent Labs Lab 09/05/15 0208 09/05/15 0403 09/05/15 0629 09/05/15 0755 09/05/15 1208  GLUCAP 106* 117* 107* 124* 100*   Scheduled Meds: . aspirin  300 mg Rectal Daily   Or  . aspirin  325 mg Oral Daily  . atorvastatin  80 mg Oral q1800  . carvedilol  6.25 mg Oral BID WC  . cefTRIAXone (ROCEPHIN)  IV  1 g Intravenous Q24H  . cholecalciferol  1,000 Units Oral Daily  . DULoxetine  60 mg Oral Daily  . heparin  5,000 Units Subcutaneous 3 times per day  .  insulin aspart  0-9 Units Subcutaneous TID WC  . lisinopril  5 mg Oral Daily  . pantoprazole (PROTONIX) IV  40 mg Intravenous Q24H  . white petrolatum       Continuous Infusions: . dextrose 5 % and 0.9% NaCl 75 mL/hr at 09/04/15 [place], MD [facility] Pager [phone]. If 7 PM - 7 AM, please contact night-coverage at [email or web address], password [facility] 09/05/2015, 3:37 PM  LOS: 2 days

## 2015-09-05 NOTE — Progress Notes (Signed)
Inpatient Rehabilitation  Received order for pre-screen.  Note consult ordered.  Admission coordinator to follow up.    Carmelia Roller., CCC/SLP Admission Coordinator  [place]  Cell [phone]

## 2015-09-05 NOTE — Progress Notes (Signed)
Speech Language Pathology Treatment: Dysphagia  Patient Details Name: Cathy Taylor MRN: [id number] DOB: 03-29-1933 Today's Date: 09/05/2015 Time: [id number] SLP Time Calculation (min) (ACUTE ONLY): 18 min  Assessment / Plan / Recommendation Clinical Impression  Pt's swallow is greatly improved today with significant change in level of alertness. She consumed a variety of solid and liquid textures without overt signs of aspiration. Will start a diet of regular textures and thin liquids. No SLP f/u needed for dysphagia, although pt may need some assistance from staff for self-feeding due to her mentation.   HPI HPI: 81 year old female admitted 09/03/15 due to RLE weakness and dysarthria. 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 MRI revealed evolution of right cerebellar infarcts since 04/2015.  SLP completed SLE 05/05/15, where pt scored 26/30 on MMSE.       SLP Plan  All goals met     Recommendations  Diet recommendations: Regular;Thin liquid Liquids provided via: Cup;Straw Medication Administration: Whole meds with liquid Supervision: Patient able to self feed;Intermittent supervision to cue for compensatory strategies Compensations: Minimize environmental distractions;Slow rate;Small sips/bites Postural Changes and/or Swallow Maneuvers: Seated upright 90 degrees       Oral Care Recommendations: Oral care BID Follow up Recommendations: Other (comment) (none for swallow - see speech/language eval) Plan: All goals met   Germain Osgood, M.A. CCC-SLP [phone]  Germain Osgood 09/05/2015, 9:39 AM

## 2015-09-06 LAB — GLUCOSE, CAPILLARY
GLUCOSE-CAPILLARY: 98 mg/dL (ref 65–99)
Glucose-Capillary: 123 mg/dL — ABNORMAL HIGH (ref 65–99)
Glucose-Capillary: 100 mg/dL — ABNORMAL HIGH (ref 65–99)
Glucose-Capillary: 94 mg/dL (ref 65–99)

## 2015-09-06 MED ORDER — PANTOPRAZOLE SODIUM 40 MG PO TBEC
40.0000 mg | DELAYED_RELEASE_TABLET | Freq: Every day | ORAL | Status: DC
Start: 2015-09-06 — End: 2015-09-07
  Administered 2015-09-07: 40 mg via ORAL
  Filled 2015-09-06: qty 1

## 2015-09-06 NOTE — Clinical Social Work Note (Addendum)
CSW spoke to patient's daughter [name] per patient's permission who would like patient to go to [facility] for short term rehab.  CSW to fax out information to SNFs, awaiting bed offers and insurance authorization.  Jones Broom. [place], MSW, [place] 09/06/2015 6:17 PM

## 2015-09-06 NOTE — Care Management Important Message (Signed)
Important Message  Patient Details  Name: Jenina Bata MRN: [id number] Date of Birth: 17-Mar-1933   Medicare Important Message Given:  Yes    Vernis Eid P Xochilt Conant 09/06/2015, 3:02 PM

## 2015-09-06 NOTE — NC FL2 (Signed)
[place] MEDICAID FL2 LEVEL OF CARE SCREENING TOOL     IDENTIFICATION  Patient Name: Cathy Taylor Birthdate: 1933/07/11 Sex: female Admission Date (Current Location): 09/03/2015  [facility] and [place] Number: Herbalist and Address:  [facility] [place], [place] [address], [place], [place] [address]      Provider Number: [id number]  Attending Physician Name and Address:  [name], MD  Relative Name and Phone Number:  [name]  Daughter  [phone]    Current Level of Care: Hospital Recommended Level of Care: [place] Prior Approval Number:    Date Approved/Denied:   PASRR Number: [id number]  Discharge Plan: SNF    Current Diagnoses: Patient Active Problem List   Diagnosis Date Noted  . Debility   . Acute encephalopathy   . Depression 09/03/2015  . Right leg weakness 09/03/2015  . Aphasia 09/03/2015  . Nausea 09/03/2015  . Hypokalemia 09/03/2015  . Stroke (cerebrum) ([place]) 09/03/2015  . Slurred speech   . Elevated troponin 05/06/2015  . Cardiomyopathy ([place]) 05/06/2015  . Altered mental status 05/04/2015  . Vertigo 05/04/2015  . Acute ischemic stroke ([place]) 05/04/2015  . Encephalopathy acute   . CVA (cerebral vascular accident) ([place])   . Memory loss 06/01/2014  . Numbness 06/01/2014  . Osteoarthritis of left shoulder 01/06/2012  . Bronchiectasis ([place]) 10/10/2011  . Hyponatremia 09/12/2011  . Dyspnea 09/10/2011  . Hypertension 09/10/2011  . Diabetes mellitus ([place]) 09/10/2011  . PMR (polymyalgia rheumatica) ([place]) 09/10/2011  . Breast cancer ([place]) 09/10/2011    Orientation RESPIRATION BLADDER Height & Weight    Self, Situation, Place  Normal Incontinent 5\' 5"  (165.1 cm) 138 lbs.  BEHAVIORAL SYMPTOMS/MOOD NEUROLOGICAL BOWEL NUTRITION STATUS      Incontinent Diet (Carb Modified)  AMBULATORY STATUS COMMUNICATION OF NEEDS Skin   Limited Assist Verbally Normal                       Personal Care  Assistance Level of Assistance  Bathing, Dressing Bathing Assistance: Limited assistance   Dressing Assistance: Limited assistance     Functional Limitations Info             SPECIAL CARE FACTORS FREQUENCY  PT (By licensed PT), OT (By licensed OT), Speech therapy     PT Frequency: 5x per week OT Frequency: 5x per week     Speech Therapy Frequency: 5x per week      Contractures      Additional Factors Info  Code Status, Allergies, Insulin Sliding Scale Code Status Info: DNR Allergies Info: NKA   Insulin Sliding Scale Info: 3x a day       Current Medications (09/06/2015):  This is the current hospital active medication list Current Facility-Administered Medications  Medication Dose Route Frequency Provider Last Rate Last Dose  . albuterol (PROVENTIL) (2.5 MG/3ML) 0.083% nebulizer solution 2.5 mg  2.5 mg Nebulization Q4H PRN Ivor Costa, MD      . aspirin suppository 300 mg  300 mg Rectal Daily Ivor Costa, MD   300 mg at 09/04/15 0935   Or  . aspirin tablet 325 mg  325 mg Oral Daily Ivor Costa, MD   325 mg at 09/06/15 1025  . atorvastatin (LIPITOR) tablet 80 mg  80 mg Oral q1800 Ivor Costa, MD   80 mg at 09/06/15 1756  . carvedilol (COREG) tablet 6.25 mg  6.25 mg Oral BID WC Ivor Costa, MD   6.25 mg at 09/06/15 1757  .  cefTRIAXone (ROCEPHIN) 1 g in dextrose 5 % 50 mL IVPB  1 g Intravenous Q24H Kelvin Cellar, MD   1 g at 09/06/15 1804  . cholecalciferol (VITAMIN D) tablet 1,000 Units  1,000 Units Oral Daily Ivor Costa, MD   1,000 Units at 09/06/15 1025  . dextrose 5 %-0.9 % sodium chloride infusion   Intravenous Continuous Kelvin Cellar, MD 75 mL/hr at 09/04/15 1739    . diclofenac sodium (VOLTAREN) 1 % transdermal gel 2 g  2 g Topical QID PRN Ivor Costa, MD      . DULoxetine (CYMBALTA) DR capsule 60 mg  60 mg Oral Daily Ivor Costa, MD   60 mg at 09/06/15 1025  . heparin injection 5,000 Units  5,000 Units Subcutaneous 3 times per day Ivor Costa, MD   5,000 Units at 09/06/15 1456   . hydrALAZINE (APRESOLINE) injection 5 mg  5 mg Intravenous Q2H PRN Ivor Costa, MD      . hydrOXYzine (VISTARIL) injection 25 mg  25 mg Intramuscular Q6H PRN Ivor Costa, MD      . insulin aspart (novoLOG) injection 0-9 Units  0-9 Units Subcutaneous TID WC Ivor Costa, MD   1 Units at 09/05/15 0708  . lisinopril (PRINIVIL,ZESTRIL) tablet 5 mg  5 mg Oral Daily Ivor Costa, MD   5 mg at 09/06/15 1025  . pantoprazole (PROTONIX) EC tablet 40 mg  40 mg Oral Q1200 [name], MD   40 mg at 09/06/15 1204  . senna-docusate (Senokot-S) tablet 1 tablet  1 tablet Oral QHS PRN Ivor Costa, MD         Discharge Medications: Please see discharge summary for a list of discharge medications.  Relevant Imaging Results:  Relevant Lab Results:   Additional Information SSN [id number]  Ross Ludwig, [place]

## 2015-09-06 NOTE — Progress Notes (Signed)
Physical Therapy Treatment Patient Details Name: Niharika Novakowski MRN: [id number] DOB: 1932/10/06 Today's Date: 09/06/2015    History of Present Illness 81 y.o. female admitted with worsening R leg wekaness, slurred speech, and facial droop. CT and MRI (-) for acute intracranial abnormalities.?med related PMH significant for memory loss, CVA (2016), Parkinson's, HTN, DM, depression, chronic back pain, remote R breast cancer, CHF, cardiac loop recorder.     PT Comments    Patient with strong posterior bias requiring +2 assist to safely ambulate up to 20 ft.  Follow Up Recommendations  Supervision/Assistance - 24 hour;SNF     Equipment Recommendations  None recommended by PT    Recommendations for Other Services       Precautions / Restrictions Precautions Precautions: Fall Restrictions Weight Bearing Restrictions: No    Mobility  Bed Mobility                  Transfers Overall transfer level: Needs assistance Equipment used: Rolling walker (2 wheeled) Transfers: Sit to/from Stand Sit to Stand: Mod assist         General transfer comment: Cues for hand placement and sequencing. mod assist due to strong posterior bias (nearly pushing locked recliner backwards) Improved with assist to keep shoulders forward as coming to stand  Ambulation/Gait Ambulation/Gait assistance: Mod assist;+2 safety/equipment Ambulation Distance (Feet): 20 Feet (seated rest, 10) Assistive device: Rolling walker (2 wheeled) Gait Pattern/deviations: Step-to pattern;Step-through pattern;Decreased step length - right;Decreased dorsiflexion - right;Decreased weight shift to left;Trunk flexed   Gait velocity interpretation: Below normal speed for age/gender General Gait Details: cues for R foot clearance, to use step-to pattern with Lt foot not to pass RLE to incr ease of advancing RLE, and upright posture. Leans to rt as she fatigues. chair to follow   Stairs            Wheelchair  Mobility    Modified Rankin (Stroke Patients Only) Modified Rankin (Stroke Patients Only) Pre-Morbid Rankin Score: Moderate disability (based off pt report which is inconsistent) Modified Rankin: Moderately severe disability     Balance   Sitting-balance support: Bilateral upper extremity supported Sitting balance-Leahy Scale: Poor Sitting balance - Comments: tendency for posterior LOB and slight left lateral lean   Standing balance support: Bilateral upper extremity supported Standing balance-Leahy Scale: Zero Standing balance comment: strong posterior lean                    Cognition Arousal/Alertness: Awake/alert Behavior During Therapy: WFL for tasks assessed/performed Overall Cognitive Status: No family/caregiver present to determine baseline cognitive functioning Area of Impairment: Orientation;Memory;Problem solving;Safety/judgement;Attention;Following commands;Awareness Orientation Level: Disoriented to;Place;Time Current Attention Level: Sustained Memory: Decreased short-term memory Following Commands: Follows multi-step commands inconsistently;Follows one step commands with increased time Safety/Judgement: Decreased awareness of deficits;Decreased awareness of safety Awareness: Intellectual Problem Solving: Difficulty sequencing;Requires verbal cues;Slow processing;Requires tactile cues General Comments: "I might have had a stroke"    Exercises      General Comments        Pertinent Vitals/Pain Pain Assessment: Faces Pain Score: 0-No pain    Home Living                      Prior Function            PT Goals (current goals can now be found in the care plan section) Acute Rehab PT Goals Patient Stated Goal: go home eventually Time For Goal Achievement: 09/19/15 Progress towards PT goals: Progressing toward goals  Frequency  Min 2X/week    PT Plan Discharge plan needs to be updated;Frequency needs to be updated     Co-evaluation             End of Session Equipment Utilized During Treatment: Gait belt Activity Tolerance: Patient tolerated treatment well Patient left: in chair;with call bell/phone within reach;with chair alarm set     Time: [phone] PT Time Calculation (min) (ACUTE ONLY): 19 min  Charges:  $Gait Training: 8-22 mins                    G Codes:      Yasenia Reedy 10/04/2015, 10:37 AM Pager [phone]

## 2015-09-06 NOTE — Progress Notes (Addendum)
Inpatient Rehabilitation  Met with patient to discuss recommendations and rehab venue options.  We are recommending SNF at this time.  I will alert CSW and RN CM.  CIR signing off.  Thanks.  Carmelia Roller., CCC/SLP Admission Coordinator  [facility]  Cell [phone]

## 2015-09-06 NOTE — Progress Notes (Signed)
PROGRESS NOTE  Cathy Taylor [id number] DOB: [birth year] DOA: 09/03/2015 PCP: Kandice Hams, MD  HPI: Cathy Taylor is a pleasant 81 year old female with a history of hypertension, diabetes mellitus, having a stroke in August 2016 at which time she presented with dysarthria, facial droop. She was brought to the emergency department for worsening right leg weakness and slurred speech. Initial workup included a CT scan of brain which was negative. Overnight she had multiple episodes of nausea vomiting for she was given Phenergan. He was admitted to the hospital and placed on the stroke protocol. MRI of brain performed today did not show an acute stroke. There was evolution of right cerebellar infarcts that have been present since August. During my encounter patient was sedated, difficult to arouse which I suspect was related to sedatives she had received overnight. It was difficult to perform neurologic examination. Neurology was consulted during this hospitalization  Subjective / 24 H Interval events - no chest pain, shortness of breath, no abdominal pain, nausea or vomiting.   Assessment/Plan: Principal Problem:   CVA (cerebral vascular accident) ([place]) Active Problems:   Hypertension   Diabetes mellitus ([place])   PMR (polymyalgia rheumatica) ([facility])   Bronchiectasis ([facility])   Slurred speech   Depression   Right leg weakness   Aphasia   Nausea   Hypokalemia   Stroke (cerebrum) ([facility])   Acute encephalopathy   Debility  Dysarthria/slurred speech/right leg weakness - Cathy. Taylor is an 81 year old female with a history of CVA back in August 2016, presented with dysarthria, worsening right leg weakness and facial droop. - Initial workup included a CT scan of brain did not reveal acute intracranial abnormality. - She was placed on CVA protocol and admitted to telemetry. - MRI of brain performed on 09/04/2015 did not show acute CVA, however it did show evolution of the right cerebellar infarct  since August. - Transthoracic echocardiogram showed EF of 65-70% - She was seen and evaluated by the stroke team who recommended obtaining EEG, chest x-ray, urinalysis with the discontinuation of Remeron. EEG unremarkable, CXR without significant findings - Will continue antiplatelet therapy with aspirin 325 mg by mouth daily and statin on discharge  Altered mental status/acute encephalopathy. - improving - She was administered IV Phenergan overnight prior for multiple episodes of nausea and vomiting may be related to that - Workup included a urinalysis which did reveal the presence of many bacteria. Started ceftriaxone 1 g IV every 24 hours the event that this may be contributing to encephalopathy. Improving today, continue antibiotics   Hypertension. - Blood pressure stable - Continue carvedilol 6.25 mg by mouth twice a day and lisinopril 5 mg by mouth daily  Urinary tract infection. - Urinalysis showed the presence of many bacteria with WBCs. - Plan to treat empirically with ceftriaxone 1 g IV every 24 hours given presence of encephalopathy. - urine cultures not obtained, will narrow empirically to Ciprofloxacin on d/c for total of 5 days  History of diabetes. - resume diet today  Diet: Diet Carb Modified Fluid consistency:: Thin; Room service appropriate?: Yes Fluids: none DVT Prophylaxis: heparin   Code Status: DNR Family Communication: no family bedside  Disposition Plan: SNF when bed available Barriers to discharge: SNF bed  Consultants:  Neurology   Procedures:  2D echo   Antibiotics  Anti-infectives    Start     Dose/Rate Route Frequency Ordered Stop   09/04/15 1745  cefTRIAXone (ROCEPHIN) 1 g in dextrose 5 % 50 mL IVPB  1 g 100 mL/hr over 30 Minutes Intravenous Every 24 hours 09/04/15 1738         Studies  Dg Chest Port 1 View  09/04/2015  CLINICAL DATA:  Confusion. History of hypertension, diabetes and stroke as well as ventricular dysfunction.  EXAM: PORTABLE CHEST 1 VIEW COMPARISON:  05/04/2015 FINDINGS: Cardiac silhouette normal in size and configuration. No mediastinal or hilar masses or evidence of adenopathy. Clear lungs. No pleural effusion or pneumothorax. Left shoulder prosthesis is well-seated and aligned. Bony thorax is diffusely demineralized. IMPRESSION: No acute cardiopulmonary disease. Electronically Signed   By: Lajean Manes M.D.   On: 09/04/2015 16:58    Objective  Filed Vitals:   09/05/15 2240 09/06/15 0122 09/06/15 0533 09/06/15 1017  BP: 164/56 145/62 169/76 130/69  Pulse: 60 62 55 68  Temp: 98.5 F (36.9 C) 98.5 F (36.9 C) 98.2 F (36.8 C) 98.1 F (36.7 C)  TempSrc: Oral Oral Oral Oral  Resp: 20 20 20 20   Height:      Weight:      SpO2: 97% 95% 97% 98%   No intake or output data in the 24 hours ending 09/06/15 1441 Filed Weights   09/04/15 0009  Weight: 62.642 kg (138 lb 1.6 oz)   Exam:  GENERAL: NAD  HEENT: no scleral icterus, PERRL  NECK: supple, no LAD  LUNGS: CTA biL, no wheezing  HEART: RRR without MRG  ABDOMEN: soft, non tender  EXTREMITIES: no clubbing / cyanosis  NEUROLOGIC: non focal, slight right sided weakness, slurred speech   Data Reviewed: Basic Metabolic Panel:  Recent Labs Lab 09/03/15 1711 09/05/15 0534  NA 141 143  K 3.4* 3.6  CL 105 111  CO2 25 23  GLUCOSE 84 116*  BUN 8 5*  CREATININE 0.75 0.72  CALCIUM 9.2 8.3*   Liver Function Tests:  Recent Labs Lab 09/03/15 1711  AST 17  ALT 11*  ALKPHOS 72  BILITOT 1.0  PROT 6.8  ALBUMIN 3.3*    Recent Labs Lab 09/03/15 1711  LIPASE 30   CBC:  Recent Labs Lab 09/03/15 1711 09/05/15 0534  WBC 10.0 8.7  NEUTROABS 5.1  --   HGB 12.6 10.7*  HCT 39.4 34.7*  MCV 86.8 88.1  PLT 324 258   Cardiac Enzymes:  Recent Labs Lab 09/03/15 1711  TROPONINI <0.03   CBG:  Recent Labs Lab 09/05/15 1208 09/05/15 1638 09/05/15 2114 09/06/15 0633 09/06/15 1140  GLUCAP 100* 98 96 98 123*    Scheduled Meds: . aspirin  300 mg Rectal Daily   Or  . aspirin  325 mg Oral Daily  . atorvastatin  80 mg Oral q1800  . carvedilol  6.25 mg Oral BID WC  . cefTRIAXone (ROCEPHIN)  IV  1 g Intravenous Q24H  . cholecalciferol  1,000 Units Oral Daily  . DULoxetine  60 mg Oral Daily  . heparin  5,000 Units Subcutaneous 3 times per day  . insulin aspart  0-9 Units Subcutaneous TID WC  . lisinopril  5 mg Oral Daily  . pantoprazole  40 mg Oral Q1200   Continuous Infusions: . dextrose 5 % and 0.9% NaCl 75 mL/hr at 09/04/15 [place], MD [facility] Pager [phone]. If 7 PM - 7 AM, please contact night-coverage at [email or web address], password [facility] 09/06/2015, 2:41 PM  LOS: 3 days

## 2015-09-07 LAB — GLUCOSE, CAPILLARY
Glucose-Capillary: 72 mg/dL (ref 65–99)
Glucose-Capillary: 93 mg/dL (ref 65–99)

## 2015-09-07 MED ORDER — CIPROFLOXACIN HCL 500 MG PO TABS: 500.0000 mg | ORAL_TABLET | Freq: Two times a day (BID) | ORAL | Status: DC

## 2015-09-07 NOTE — Progress Notes (Signed)
Pt discharged at this time to [facility] in [facility]  SNF via [place] transport. Pt stable with no noted distress. Family to take all personal belongings. IV discontinued, dry dressing applied. Discharge paperwork sent with pt to facility. Report called in to nurse Hoyle Sauer.

## 2015-09-07 NOTE — Progress Notes (Signed)
Speech Language Pathology Treatment: Cognitive-Linquistic  Patient Details Name: Cathy Taylor MRN: [id number] DOB: November 21, 1932 Today's Date: 09/07/2015 Time: [id number] SLP Time Calculation (min) (ACUTE ONLY): 16 min  Assessment / Plan / Recommendation Clinical Impression  F/u for dysarthria, cognitive deficits.  Pt with 80% intelligibility requiring mod verbal cues for pacing and clarity.  Pt with tangential output, remains with confusion but improved orientation today to elements of time and place.  Educated family re: plan for SNF f/u for cognition and speech.  Swallowing is WFL.     HPI HPI: 81 year old female admitted 09/03/15 due to RLE weakness and dysarthria. 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 MRI revealed evolution of right cerebellar infarcts since 04/2015.  SLP completed SLE 05/05/15, where pt scored 26/30 on MMSE.       SLP Plan  Continue with current plan of care     Recommendations   SLP f/u at SNF              Oral Care Recommendations: Oral care BID Follow up Recommendations: Skilled Nursing facility Plan: Continue with current plan of care   Juan Quam Laurice 09/07/2015, 11:25 AM

## 2015-09-07 NOTE — Clinical Social Work Placement (Signed)
   CLINICAL SOCIAL WORK PLACEMENT  NOTE  Date:  09/07/2015  Patient Details  Name: Cathy Taylor MRN: [id number] Date of Birth: [birth year]  Clinical Social Work is seeking post-discharge placement for this patient at the [place] level of care (*CSW will initial, date and re-position this form in  chart as items are completed):  Yes   Patient/family provided with [place] Work Department's list of facilities offering this level of care within the geographic area requested by the patient (or if unable, by the patient's family).  Yes   Patient/family informed of their freedom to choose among providers that offer the needed level of care, that participate in Medicare, Medicaid or managed care program needed by the patient, have an available bed and are willing to accept the patient.  Yes   Patient/family informed of [facility]'s ownership interest in [facility] and [facility], as well as of the fact that they are under no obligation to receive care at these facilities.  PASRR submitted to EDS on 09/06/15     PASRR number received on       Existing PASRR number confirmed on 09/06/15     FL2 transmitted to all facilities in geographic area requested by pt/family on 09/06/15     FL2 transmitted to all facilities within larger geographic area on 09/06/15     Patient informed that his/her managed care company has contracts with or will negotiate with certain facilities, including the following:            Patient/family informed of bed offers received.  Patient chooses bed at       Physician recommends and patient chooses bed at      Patient to be transferred to   on  .  Patient to be transferred to facility by       Patient family notified on   of transfer.  Name of family member notified:        PHYSICIAN Please sign DNR     Additional Comment:    _______________________________________________ Ross Ludwig,  [place] 09/07/2015, 6:33 AM

## 2015-09-07 NOTE — Discharge Summary (Signed)
Physician Discharge Summary  Cathy Taylor [id number] DOB: [birth year] DOA: 09/03/2015  PCP: Kandice Hams, MD  Admit date: 09/03/2015 Discharge date: 09/07/2015  Time spent: > 30 minutes  Recommendations for Outpatient Follow-up:  1. Follow up with Dr. Delfina Redwood in 2-3 weeks 2. Follow up with Dr. Theresia Lo in 6 months 3. Ciprofloxacin for 3 additional days   Discharge Diagnoses:  Principal Problem:   CVA (cerebral vascular accident) ([place]) Active Problems:   Hypertension   Diabetes mellitus ([place])   PMR (polymyalgia rheumatica) ([facility])   Bronchiectasis ([place])   Slurred speech   Depression   Right leg weakness   Aphasia   Nausea   Hypokalemia   Stroke (cerebrum) ([facility])   Acute encephalopathy   Debility  Discharge Condition: stable  Diet recommendation: heart healthy  Filed Weights   09/04/15 0009  Weight: 62.642 kg (138 lb 1.6 oz)    History of present illness:  See H&P, Labs, Consult and Test reports for all details in brief, patient is a Cathy Taylor is a pleasant 81 year old female with a history of hypertension, diabetes mellitus, having a stroke in August 2016 at which time she presented with dysarthria, facial droop. She was brought to the emergency department for worsening right leg weakness and slurred speech.   Hospital Course:  Dysarthria/slurred speech/right leg weakness - Cathy. Taylor is an 81 year old female with a history of CVA back in August 2016, presented with dysarthria, worsening right leg weakness and facial droop. Initial workup included a CT scan of brain did not reveal acute intracranial abnormality. She was placed on CVA protocol and admitted to telemetry. MRI of brain performed on 09/04/2015 did not show acute CVA, however it did show evolution of the right cerebellar infarct since August. Transthoracic echocardiogram showed EF of 65-70%. She was seen and evaluated by the stroke team who recommended obtaining EEG, chest x-ray, urinalysis with the  discontinuation of Remeron. EEG unremarkable, CXR without significant findings. Will continue antiplatelet therapy with aspirin 325 mg by mouth daily and statin on discharge Altered mental status/acute encephalopathy - improving, perhaps due to UTI, antibiotics as below Hypertension - Blood pressure stable, resume home regimen  Urinary tract infection - Urinalysis showed the presence of many bacteria with WBCs. She was given Ceftriaxone empirically, cultures not able to be obtained before antibiotics, narrow empirically to Ciprofloxacin for 3 additional days.  History of diabetes - resume home medications  Procedures:  2D echo   Consultations:  Neurology  Discharge Exam: Filed Vitals:   09/06/15 1827 09/06/15 2156 09/07/15 0150 09/07/15 1012  BP: 151/65 142/59 174/72 141/65  Pulse: 67 57 60 64  Temp: 98.1 F (36.7 C) 98.2 F (36.8 C) 98 F (36.7 C) 98.4 F (36.9 C)  TempSrc: Oral Oral Oral Oral  Resp: 20 20 20 17   Height:      Weight:      SpO2: 98% 97% 96% 98%    General: NAD Cardiovascular: RRR Respiratory: CTA biL  Discharge Instructions Activity:  As tolerated   Get Medicines reviewed and adjusted: Please take all your medications with you for your next visit with your Primary MD  Please request your Primary MD to go over all hospital tests and procedure/radiological results at the follow up, please ask your Primary MD to get all Hospital records sent to his/her office.  If you experience worsening of your admission symptoms, develop shortness of breath, life threatening emergency, suicidal or homicidal thoughts you must seek medical attention immediately by calling 911 or  calling your MD immediately if symptoms less severe.  You must read complete instructions/literature along with all the possible adverse reactions/side effects for all the Medicines you take and that have been prescribed to you. Take any new Medicines after you have completely understood and  accpet all the possible adverse reactions/side effects.   Do not drive when taking Pain medications.   Do not take more than prescribed Pain, Sleep and Anxiety Medications  Special Instructions: If you have smoked or chewed Tobacco in the last 2 yrs please stop smoking, stop any regular Alcohol and or any Recreational drug use.  Wear Seat belts while driving.  Please note  You were cared for by a hospitalist during your hospital stay. Once you are discharged, your primary care physician will handle any further medical issues. Please note that NO REFILLS for any discharge medications will be authorized once you are discharged, as it is imperative that you return to your primary care physician (or establish a relationship with a primary care physician if you do not have one) for your aftercare needs so that they can reassess your need for medications and monitor your lab values. Discharge Instructions    Ambulatory referral to Neurology    Complete by:  As directed   Please schedule post stroke follow up in 6 months - as per Dr. Clydene Fake last office note (hopsitalized this time for encephalopathy, someone else to follow up on that)            Medication List    TAKE these medications        acetaminophen 500 MG tablet  Commonly known as:  TYLENOL  Take 500 mg by mouth every 6 (six) hours as needed. For pain     albuterol 108 (90 BASE) MCG/ACT inhaler  Commonly known as:  PROVENTIL HFA;VENTOLIN HFA  Inhale 2 puffs into the lungs every 6 (six) hours as needed. Shortness of breath     aspirin 325 MG tablet  Take 1 tablet (325 mg total) by mouth daily.     atorvastatin 80 MG tablet  Commonly known as:  LIPITOR  Take 1 tablet (80 mg total) by mouth daily at 6 PM.     carvedilol 6.25 MG tablet  Commonly known as:  COREG  Take 1 tablet (6.25 mg total) by mouth 2 (two) times daily with a meal.     ciprofloxacin 500 MG tablet  Commonly known as:  CIPRO  Take 1 tablet (500 mg  total) by mouth 2 (two) times daily. For 3 additional days     DULoxetine 60 MG capsule  Commonly known as:  CYMBALTA  Take 1 capsule (60 mg total) by mouth daily.     esomeprazole 20 MG capsule  Commonly known as:  NEXIUM  Take 20 mg by mouth 2 (two) times daily before a meal.     glimepiride 1 MG tablet  Commonly known as:  AMARYL  Take 1 mg by mouth daily with breakfast.     lisinopril 5 MG tablet  Commonly known as:  PRINIVIL,ZESTRIL  Take 1 tablet (5 mg total) by mouth daily.     metFORMIN 500 MG tablet  Commonly known as:  GLUCOPHAGE  Take 1,000 mg by mouth 2 (two) times daily. [facility] papers states 1000 mg tabs twice a day.. Pt states pill are 500 mg 2 in am 1 in pm     VITAMIN D-3 PO  Take 1,000 mg by mouth daily.  VOLTAREN 1 % Gel  Generic drug:  diclofenac sodium  Apply 1 application topically 4 (four) times daily as needed. Pain in shoulder           Follow-up Information    Follow up with SETHI,PRAMOD, MD In 6 months.   Specialties:  Neurology, Radiology   Why:  Stroke Clinic, Office will call you with appointment date & time   Contact information:   [place] [address] [phone]       Follow up with Kandice Hams, MD. Schedule an appointment as soon as possible for a visit in 2 weeks.   Specialty:  Internal Medicine   Contact information:   [address] [place] [address] [phone]       The results of significant diagnostics from this hospitalization (including imaging, microbiology, ancillary and laboratory) are listed below for reference.    Significant Diagnostic Studies: Ct Head Wo Contrast  09/03/2015  CLINICAL DATA:  Acute onset of slurred speech and left-sided weakness. Intermittent vomiting. Initial encounter. EXAM: CT HEAD WITHOUT CONTRAST TECHNIQUE: Contiguous axial images were obtained from the base of the skull through the vertex without intravenous contrast.  COMPARISON:  MRI/MRA of the head performed 05/04/2015, and CT of the head performed 05/06/2015 FINDINGS: There is no evidence of acute infarction, mass lesion, or intra- or extra-axial hemorrhage on CT. Prominence of the ventricles and sulci reflects mild to moderate cortical volume loss. Mild cerebellar atrophy is noted. Chronic encephalomalacia is noted at the right cerebellar hemisphere, reflecting remote infarct. Chronic ischemic change is noted at the basal ganglia bilaterally. Scattered periventricular and subcortical white matter change likely reflects small vessel ischemic microangiopathy. The brainstem and fourth ventricle are within normal limits. The cerebral hemispheres demonstrate grossly normal gray-white differentiation. No mass effect or midline shift is seen. There is no evidence of fracture; visualized osseous structures are unremarkable in appearance. The visualized portions of the orbits are within normal limits. The paranasal sinuses and mastoid air cells are well-aerated. No significant soft tissue abnormalities are seen. IMPRESSION: 1. No acute intracranial pathology seen on CT. 2. Mild to moderate cortical volume loss and scattered small vessel ischemic microangiopathy. 3. Chronic encephalomalacia at the right cerebellar hemisphere, reflecting remote infarct. Chronic ischemic change at the basal ganglia bilaterally. Electronically Signed   By: Garald Balding M.D.   On: 09/03/2015 21:28   Mr Brain Wo Contrast  09/04/2015  CLINICAL DATA:  81 year old female status post right superior cerebellar infarct in August. Worsening right lower extremity weakness, slurred speech and facial droop. Initial encounter. EXAM: MRI HEAD WITHOUT CONTRAST TECHNIQUE: Multiplanar, multiecho pulse sequences of the brain and surrounding structures were obtained without intravenous contrast. COMPARISON:  Head CT 09/03/2015.  Brain MRI 05/04/2015, and earlier. FINDINGS: Petechial hemorrhage and developing  encephalomalacia in the right cerebellum related to the August infarct. Mild mass effect on the fourth ventricle has resolved, now with mild ex vacuo enlargement. Major intracranial vascular flow voids are stable. No restricted diffusion or evidence of acute infarction. Outside of the right cerebellum stable gray and white matter signal with Patchy and confluent cerebral white matter and deep gray matter T2 heterogeneity. Chronic lacunar infarct of the left basal ganglia. No hemosiderin outside of the right cerebellum. No midline shift, mass effect, evidence of mass lesion, ventriculomegaly, extra-axial collection or acute intracranial hemorrhage. Cervicomedullary junction and pituitary are within normal limits. Negative visualized cervical spine. Visible internal auditory structures appear normal. Mastoids are clear. Trace  paranasal sinus mucosal thickening is stable. Orbit and scalp soft tissues are stable. Normal bone marrow signal. IMPRESSION: 1. Evolution of the right cerebellar infarcts since August. Petechial hemorrhage has developed. Developing encephalomalacia with no posterior fossa mass effect. 2. No new intracranial abnormality. Elsewhere stable MRI appearance of the brain with chronic small vessel disease. Electronically Signed   By: Genevie Ann M.D.   On: 09/04/2015 08:56   Dg Chest Port 1 View  09/04/2015  CLINICAL DATA:  Confusion. History of hypertension, diabetes and stroke as well as ventricular dysfunction. EXAM: PORTABLE CHEST 1 VIEW COMPARISON:  05/04/2015 FINDINGS: Cardiac silhouette normal in size and configuration. No mediastinal or hilar masses or evidence of adenopathy. Clear lungs. No pleural effusion or pneumothorax. Left shoulder prosthesis is well-seated and aligned. Bony thorax is diffusely demineralized. IMPRESSION: No acute cardiopulmonary disease. Electronically Signed   By: Lajean Manes M.D.   On: 09/04/2015 16:58    Microbiology: No results found for this or any previous  visit (from the past 240 hour(s)).   Labs: Basic Metabolic Panel:  Recent Labs Lab 09/03/15 1711 09/05/15 0534  NA 141 143  K 3.4* 3.6  CL 105 111  CO2 25 23  GLUCOSE 84 116*  BUN 8 5*  CREATININE 0.75 0.72  CALCIUM 9.2 8.3*   Liver Function Tests:  Recent Labs Lab 09/03/15 1711  AST 17  ALT 11*  ALKPHOS 72  BILITOT 1.0  PROT 6.8  ALBUMIN 3.3*    Recent Labs Lab 09/03/15 1711  LIPASE 30   No results for input(s): AMMONIA in the last 168 hours. CBC:  Recent Labs Lab 09/03/15 1711 09/05/15 0534  WBC 10.0 8.7  NEUTROABS 5.1  --   HGB 12.6 10.7*  HCT 39.4 34.7*  MCV 86.8 88.1  PLT 324 258   Cardiac Enzymes:  Recent Labs Lab 09/03/15 1711  TROPONINI <0.03   BNP: BNP (last 3 results) No results for input(s): BNP in the last 8760 hours.  ProBNP (last 3 results) No results for input(s): PROBNP in the last 8760 hours.  CBG:  Recent Labs Lab 09/06/15 0633 09/06/15 1140 09/06/15 1653 09/06/15 2153 09/07/15 0646  GLUCAP 98 123* 100* 94 72       Signed:  Jaleena Viviani  [facility] 09/07/2015, 10:36 AM

## 2015-09-07 NOTE — Clinical Social Work Note (Signed)
Clinical Social Work Assessment  Patient Details  Name: Cathy Taylor MRN: [id number] Date of Birth: [birth year]  Date of referral:  09/06/15               Reason for consult:  Facility Placement                Permission sought to share information with:    Permission granted to share information::  Yes, Verbal Permission Granted  Name::     Cathy Taylor,Cathy Taylor [phone]  Agency::  SNF Admissions  Relationship::     Contact Information:     Housing/Transportation Living arrangements for the past 2 months:  Single Family Home Source of Information:  Patient, Adult Children Patient Interpreter Needed:  None Criminal Activity/Legal Involvement Pertinent to Current Situation/Hospitalization:  No - Comment as needed Significant Relationships:  Adult Children, Spouse Lives with:  Spouse Do you feel safe going back to the place where you live?  Yes (Patient feels once she has some short term rehab then she can return back home.) Need for family participation in patient care:  Yes (Comment) (Patient requests [name] to be a main contact for decision making)  Care giving concerns:  Patient and her Taylor feels she needs some short term rehab before going home.   Social Worker assessment / plan:  Patient is a 81 year old female who lives with her husband, however her husband is currently at [facility]F.  Patient is alert and oriented x4 and able to make her own decisions.  Patient and her family were hoping she could go to inpatient rehab, however CIR are not taking her.  Patient states her husband is at [facility] for short term rehab, and she would like to go there as well if possible.  Patient expressed she has been to rehab in the past, and had a good experience.  Patient's family are supportive of patient and available to help make decisions per patient's request.  Patient was reminded how SNF is paid for by her insurance and expressed that she did not have any concerns  or worries.  Patient is hopeful that she can discharge to SNF and begin her rehab.  Patient and her family are in agreement to going to SNF for short term rehab.  Employment status:  Retired Nurse, adult PT Recommendations:  [place] / Referral to community resources:  [place]  Patient/Family's Response to care:  Patient and family agreeable for short term rehab.  Patient/Family's Understanding of and Emotional Response to Diagnosis, Current Treatment, and Prognosis:  Patient and family aware of current treatment and diagnosis.  Emotional Assessment Appearance:  Appears stated age Attitude/Demeanor/Rapport:    Affect (typically observed):  Appropriate, Calm, Pleasant Orientation:  Oriented to  Time, Oriented to Place, Oriented to Self, Oriented to Situation Alcohol / Substance use:    Psych involvement (Current and /or in the community):  No (Comment)  Discharge Needs  Concerns to be addressed:  No discharge needs identified Readmission within the last 30 days:  No Current discharge risk:  None Barriers to Discharge:  Insurance Authorization   Anell Barr 09/07/2015, 6:27 AM

## 2015-09-07 NOTE — Clinical Social Work Placement (Signed)
   CLINICAL SOCIAL WORK PLACEMENT  NOTE  Date:  09/07/2015  Patient Details  Name: Cathy Taylor MRN: [id number] Date of Birth: [birth year]  Clinical Social Work is seeking post-discharge placement for this patient at the [place] level of care (*CSW will initial, date and re-position this form in  chart as items are completed):  Yes   Patient/family provided with [facility] Work Department's list of facilities offering this level of care within the geographic area requested by the patient (or if unable, by the patient's family).  Yes   Patient/family informed of their freedom to choose among providers that offer the needed level of care, that participate in Medicare, Medicaid or managed care program needed by the patient, have an available bed and are willing to accept the patient.  Yes   Patient/family informed of [facility]'s ownership interest in [facility] and [facility], as well as of the fact that they are under no obligation to receive care at these facilities.  PASRR submitted to EDS on 09/06/15     PASRR number received on       Existing PASRR number confirmed on 09/06/15     FL2 transmitted to all facilities in geographic area requested by pt/family on 09/06/15     FL2 transmitted to all facilities within larger geographic area on 09/06/15     Patient informed that his/her managed care company has contracts with or will negotiate with certain facilities, including the following:   [facility] Medicare)     Yes   Patient/family informed of bed offers received.  Patient chooses bed at [facility]     Physician recommends and patient chooses bed at      Patient to be transferred to [facility] on 09/07/15.  Patient to be transferred to facility by       Patient family notified on 09/07/15 of transfer.  Name of family member notified:  Daughter:  [name]     PHYSICIAN Please sign DNR,  Please prepare priority discharge summary, including medications, Please sign FL2, Please prepare prescriptions     Additional Comment: OK per MD for d/c today to SNF for short term rehab.  Patient's husband is a current resident at [facility] and this will be preferable to family to have them both in the same facility.  Blue Medicare auth received from [facility] per Admissions at facilty.  EMS arranged and nursing to call report. DC summary sent to the facilty.  No further CSW needs identified.  CSW signing off.     _______________________________________________ Williemae Area, LCSW 09/07/2015, 3:49 PM

## 2015-09-10 LAB — CUP PACEART REMOTE DEVICE CHECK: Date Time Interrogation Session: 20161114120552

## 2015-09-12 LAB — CUP PACEART REMOTE DEVICE CHECK: MDC IDC SESS DTM: 20161221050500

## 2015-09-21 ENCOUNTER — Encounter: Payer: Self-pay | Admitting: Cardiology

## 2015-10-05 ENCOUNTER — Ambulatory Visit (INDEPENDENT_AMBULATORY_CARE_PROVIDER_SITE_OTHER): Payer: Medicare Other | Admitting: *Deleted

## 2015-10-05 DIAGNOSIS — I639 Cerebral infarction, unspecified: Secondary | ICD-10-CM

## 2015-10-08 NOTE — Progress Notes (Signed)
[facility] Summary Report / Loop Recorder 

## 2015-10-20 LAB — CUP PACEART REMOTE DEVICE CHECK: Date Time Interrogation Session: 20161214123548

## 2015-11-05 ENCOUNTER — Ambulatory Visit (INDEPENDENT_AMBULATORY_CARE_PROVIDER_SITE_OTHER): Payer: Medicare Other | Admitting: *Deleted

## 2015-11-05 DIAGNOSIS — I639 Cerebral infarction, unspecified: Secondary | ICD-10-CM | POA: Diagnosis not present

## 2015-11-05 NOTE — Progress Notes (Signed)
[facility] Summary Report / Loop Recorder 

## 2015-11-28 LAB — CUP PACEART REMOTE DEVICE CHECK: Date Time Interrogation Session: 20170212123604

## 2015-11-28 NOTE — Progress Notes (Signed)
Carelink summary report received. Battery status OK. Normal device function. No new symptom episodes, tachy episodes, brady, or pause episodes. No new AF episodes. Monthly summary reports and ROV/PRN 

## 2015-11-29 LAB — CUP PACEART REMOTE DEVICE CHECK: MDC IDC SESS DTM: 20170113123603

## 2015-11-29 NOTE — Progress Notes (Signed)
Carelink summary report received. Battery status OK. Normal device function. No new symptom episodes, brady, or pause episodes. No new AF episodes. 1 tachy episode, previously reviewed, per documentation pt was having a MRI at the time of the tachy episode storage. Monthly summary reports and ROV/PRN

## 2015-12-04 ENCOUNTER — Ambulatory Visit (INDEPENDENT_AMBULATORY_CARE_PROVIDER_SITE_OTHER): Payer: Medicare Other | Admitting: *Deleted

## 2015-12-04 DIAGNOSIS — I639 Cerebral infarction, unspecified: Secondary | ICD-10-CM

## 2015-12-04 LAB — CUP PACEART REMOTE DEVICE CHECK: MDC IDC SESS DTM: 20170314130530

## 2015-12-04 NOTE — Progress Notes (Signed)
[facility] Summary Report / Loop Recorder 

## 2016-01-01 ENCOUNTER — Other Ambulatory Visit: Payer: Self-pay

## 2016-01-01 ENCOUNTER — Ambulatory Visit (INDEPENDENT_AMBULATORY_CARE_PROVIDER_SITE_OTHER): Payer: Medicare Other | Admitting: Neurology

## 2016-01-01 ENCOUNTER — Encounter: Payer: Self-pay | Admitting: Neurology

## 2016-01-01 VITALS — BP 109/67 | HR 68 | Ht 64.0 in | Wt 132.0 lb

## 2016-01-01 DIAGNOSIS — F0390 Unspecified dementia without behavioral disturbance: Secondary | ICD-10-CM

## 2016-01-01 DIAGNOSIS — I639 Cerebral infarction, unspecified: Secondary | ICD-10-CM

## 2016-01-01 MED ORDER — DONEPEZIL HCL 5 MG PO TABS: 5.0000 mg | ORAL_TABLET | Freq: Every day | ORAL | Status: DC

## 2016-01-01 NOTE — Progress Notes (Signed)
[facility] [address] [place]. [place] [address] [phone]       OFFICE FOLLOW-UP NOTE  [name] Date of Birth:  November 25, 1932 Medical Record Number:  [id number]   HPI:  Last visit 07/02/2015 : [name] is seen today for first office follow-up visit following admission to [facility] in August 2016 with stroke. Cathy Taylor is an 82 y.o. female with a istory of hypertension, hyperlipidemia, diabetes mellitus, breast cancer and polymyalgia rheumatica, who presented with vertigo with nausea she was also noted to have slurred speech and confusion and was sent to the emergency room for evaluation from her Primary care physician's office. No focal weakness was noted. She did not experience diplopia. CT scan of her head showed no acute intracranial abnormality. She was noted to have pinpoint pupils on arrival in the emergency room. Urine drug screen was negativeg. Patient has not been on antiplatelets therapy. She had no history of stroke nor TIA. MRI of the brain showed a large acute right cerebellar ischemic infarction. LSN: 05/03/2015 tPA Given: No: Beyond time window for treatment consideration. She was admitted to the neurological unit where she remained stable. MRI scan of the brain showed an acute large superior cerebellar artery infarct on the right side. There is no hydrocephalus. MRA showed occlusion of the right superior cerebellar artery proximally. CT angiogram showed 75% stenosis of the right ICA with dense atheromatous calcification in less than 50% stenosis of left ICA. Carotid ultrasound showed only however 40-59% ICA stenosis bilaterally with the acoustic shadowing due to calcific plaques which may obscure higher velocities. CT angiogram hence was obtained which confirm 75% right ICA stenosis which was asymptomatic. LDL cholesterol was elevated at 88 mg percent. Transthoracic echo showed ejection fraction of 30-35% but no cardiac source of emboli.  Transesophageal echocardiogram confirmed low cardiac ejection fraction but showed no cardiac source of embolism. Hemoglobin A1c was 6.6. Patient had loop recorder inserted for paroxysmal A. fib and so far negative has not had been found. Patient was transferred to skilled nursing facility for inpatient rehabilitation and made gradual improvement and is currently at home for the last 1 week. She is able to walk with a walker but needs a one-person assist. She has lack of motivation and has not been eating well due to decreased appetite and nausea. Patient is also under a lot of stress, husband is not well. She does take Cymbalta 30 mg daily but has not yet tried a higher dose. The family feels that is not helping. She still has some dysarthria and right hemiparesis and dysarthria seems to get worse in the afternoon when she is tired. Update 01/01/2016 : She returns for follow-up today after last visit 6 months ago. She is accompanied by her granddaughter and great-granddaughter. Patient continues to live at home but now she has 24-hour care. She's had a few falls when she is not careful. She does use a walker mostly but needs 1 person standing right behind her because she has a tendency to fall backwards. The family has noticed significant worsening of cognition and short-term memory. She has not been evaluated for dementia or tried any medications for that. She needs some help but is continent and can use the toilet and dress herself with minimum help. She remains on aspirin for stroke prevention and has not had any recurrent strokes or TIAs. She is tolerating Lipitor well without myalgias or arthralgias. Blood pressure seems well controlled and today it is 109/67 in  office. Her sugars have also been well controlled. She is does not walk a whole lot and family physician and benefits with in-home physical and occupational therapy. On Mini-Mental status exam testing today she scored 17/30 with deficits in orientation,  attention and calculation and recall and following 3 step commands. She has not been on medications like Aricept she was hospitalized have in December 2016 with transient slurred speech and facial weakness in the setting of being started on new medication Remeron. I have personally reviewed the hospital records and MRI scan the brain did not show an acute infarct. Patient was felt to have transient encephalopathy secondary to starting the new medication Remeron and her deficits.cleared quickly. ROS:   14 system review of systems is positive for speech difficulty, bruising, gait difficulty, falls, memory loss and all other systems negative PMH:  Past Medical History  Diagnosis Date  . Stress fracture of left femur with nonunion 07/08/2011  . PMR (polymyalgia rheumatica) (HCC)   . GERD (gastroesophageal reflux disease)   . Anxiety   . Hypertension   . Abdominal hernia   . Hypercholesteremia   . Heart murmur   . Chronic back pain greater than 3 months duration   . Bronchiectasis 11/12  . 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 Depression   . Bronchitis        . Type II diabetes mellitus ([place])   . Umbilical hernia [id number]    unrepaired  . Breast cancer ([place]) 1973    right  . Arthritis   . Osteoarthritis of left shoulder 01/06/2012  . Stroke ([place])   . Left ventricular dysfunction 04/2015    EF 30% by TEE, + AK mid-apical anteroseptal and anterior walls plus apex, neg bubble study, grade III plaque asc Ao, suspect ICM    Social History:  Social History   Social History  . Marital Status: Married    Spouse Name: N/A  . Number of Children: N/A  . Years of Education: N/A   Occupational History  . Not on file.   Social History Main Topics  . Smoking status: Never Smoker   . Smokeless tobacco: Former Systems developer    Types: Snuff    Quit date: 09/22/1948     Comment: "just tried smoking; didn't smoke to amount to nothing"  . Alcohol Use: No  . Drug Use: No  . Sexual Activity: Not Currently    Other Topics Concern  . Not on file   Social History Narrative   Currently lives with her husband who has Parkinson's Disease and Dementia. Their children live nearby. They have home health caregivers who assist them throughout the week as well.    Medications:   Current Outpatient Prescriptions on File Prior to Visit  Medication Sig Dispense Refill  . acetaminophen (TYLENOL) 500 MG tablet Take 500 mg by mouth every 6 (six) hours as needed. For pain     . albuterol (PROVENTIL HFA;VENTOLIN HFA) 108 (90 BASE) MCG/ACT inhaler Inhale 2 puffs into the lungs every 6 (six) hours as needed. Shortness of breath    . aspirin 325 MG tablet Take 1 tablet (325 mg total) by mouth daily.    Marland Kitchen atorvastatin (LIPITOR) 80 MG tablet Take 1 tablet (80 mg total) by mouth daily at 6 PM.    . carvedilol (COREG) 6.25 MG tablet Take 1 tablet (6.25 mg total) by mouth 2 (two) times daily with a meal.    . Cholecalciferol (VITAMIN D-3 PO) Take  1,000 mg by mouth daily.     . diclofenac sodium (VOLTAREN) 1 % GEL Apply 1 application topically 4 (four) times daily as needed. Pain in shoulder    . DULoxetine (CYMBALTA) 60 MG capsule Take 1 capsule (60 mg total) by mouth daily. 30 capsule 5  . esomeprazole (NEXIUM) 20 MG capsule Take 20 mg by mouth 2 (two) times daily before a meal.    . glimepiride (AMARYL) 1 MG tablet Take 1 mg by mouth daily with breakfast.    . lisinopril (PRINIVIL,ZESTRIL) 5 MG tablet Take 1 tablet (5 mg total) by mouth daily. 90 tablet 3  . metFORMIN (GLUCOPHAGE) 500 MG tablet Take 1,000 mg by mouth 2 (two) times daily. Universal healthcare papers states 1000 mg tabs twice a day.. Pt states pill are 500 mg 2 in am 1 in pm     No current facility-administered medications on file prior to visit.    Allergies:  No Known Allergies  Physical Exam General: Frail elderly Caucasian lady, seated, in no evident distress Head: head normocephalic and atraumatic.  Neck: supple with no carotid or  supraclavicular bruits Cardiovascular: regular rate and rhythm, no murmurs Musculoskeletal: no deformity. Mild kyphosis Skin:  no rash/petichiae Vascular:  Normal pulses all extremities Filed Vitals:   01/01/16 1107  BP: 109/67  Pulse: 68   Neurologic Exam Mental Status: Awake and fully alert. Oriented to person. Diminished memory. Attention span, concentration and fund of knowledge diminished. Mood and affect appear depressed. No dysarthria or aphasia. MMSE 17/30 with deficits in orientation, attention, calculation, recall and following three-step commands. Animal naming 8 only. Clock drawing 1/4. Geriatric depression scale 5 borderline for depression Cranial Nerves: Fundoscopic exam reveals sharp disc margins. Pupils equal, briskly reactive to light. Extraocular movements full without nystagmus. Visual fields full to confrontation. Hearing intact. Facial sensation intact. Face, tongue, palate moves normally and symmetrically.  Motor: Mild right hemiparesis with right upper extremity drift. Weakness of right grip and intrinsic hand muscles. Mild right foot drop. Mild weakness of right hip flexors and ankle dorsiflexors are 2/5 Sensory.: intact to touch ,pinprick .position and vibratory sensation.  Coordination: Mild impaired right finger-to-nose and knee to heel coordination. Normal coordination on the left.  Gait and Station: needed 1 person assist as patient did not get a walker.short steps and mild imbalance  Reflexes: 1+ and symmetric. Toes downgoing.   NIHSS  4 Modified Rankin  4   ASSESSMENT: 61 year Caucasian lady with history of diabetes mellitus, hypertension, hyperlipidemia, GERD, depression, PMR, chronic back pain, remote right breast cancer, systolic congestive heart failure, previous stroke on August 2016, cardiac loop recorder in place 05/08/15 presenting  In December 2016 with worsening right leg weakness, slurred speech and facial droop in setting of headache likely  encephalopathy from Remeron. MRI showed no acute infarct. Patient has had cognitive impairment and was doing since then as per family likely due to underlying mild dementia but treatable causes need to be looked for    PLAN: I had a long d/w patient and her grand daughter about her recent stroke, risk for recurrent stroke/TIAs, personally independently reviewed imaging studies and stroke evaluation results and answered questions.Continue aspirin 325 mg daily  for secondary stroke prevention and maintain strict control of hypertension with blood pressure goal below 130/90, diabetes with hemoglobin A1c goal below 6.5% and lipids with LDL cholesterol goal below 70 mg/dL. I also advised the patient to check dementia panel labs, EEG and MRI scan to look for reversible  causes of memory loss. Trial of Aricept 5 mg daily and if tolerated without side effects increased to 10 mg daily. She will return for follow-up in 2 months or call earlier if necessary. I also encouraged patient to follow fall and safety precautions and to use her 24-hour care at home. This was a prolonged visit requiring 45 minutes and medical decision making of high complexity with extensive review of history, hospital chart, counseling and answering questions Greater than 50% of time during this 25 minute visit was spent on counseling,explanation of diagnosis, planning of further management, discussion with patient and family and coordination of care Antony Contras, MD Note: This document was prepared with digital dictation and possible smart phrase technology. Any transcriptional errors that result from this process are unintentional

## 2016-01-01 NOTE — Patient Instructions (Signed)
I had a long d/w patient and her grand daughter about her recent stroke, risk for recurrent stroke/TIAs, personally independently reviewed imaging studies and stroke evaluation results and answered questions.Continue aspirin 325 mg daily  for secondary stroke prevention and maintain strict control of hypertension with blood pressure goal below 130/90, diabetes with hemoglobin A1c goal below 6.5% and lipids with LDL cholesterol goal below 70 mg/dL. I also advised the patient to check dementia panel labs, EEG and MRI scan to look for reversible causes of memory loss. Trial of Aricept 5 mg daily and if tolerated without side effects increased to 10 mg daily. She will return for follow-up in 2 months or call earlier if necessary. I also encouraged patient to follow fall and safety precautions and to use her 24-hour care at home. Dementia Dementia is a general term for problems with brain function. A person with dementia has memory loss and a hard time with at least one other brain function such as thinking, speaking, or problem solving. Dementia can affect social functioning, how you do your job, your mood, or your personality. The changes may be hidden for a long time. The earliest forms of this disease are usually not detected by family or friends. Dementia can be:  Irreversible.  Potentially reversible.  Partially reversible.  Progressive. This means it can get worse over time. CAUSES  Irreversible dementia causes may include:  Degeneration of brain cells (Alzheimer disease or Lewy body dementia).  Multiple small strokes (vascular dementia).  Infection (chronic meningitis or Creutzfeldt-Jakob disease).  Frontotemporal dementia. This affects younger people, age 3 to 32, compared to those who have Alzheimer disease.  Dementia associated with other disorders like Parkinson disease, Huntington disease, or HIV-associated dementia. Potentially or partially reversible dementia causes may  include:  Medicines.  Metabolic causes such as excessive alcohol intake, vitamin B12 deficiency, or thyroid disease.  Masses or pressure in the brain such as a tumor, blood clot, or hydrocephalus. SIGNS AND SYMPTOMS  Symptoms are often hard to detect. Family members or coworkers may not notice them early in the disease process. Different people with dementia may have different symptoms. Symptoms can include:  A hard time with memory, especially recent memory. Long-term memory may not be impaired.  Asking the same question multiple times or forgetting something someone just said.  A hard time speaking your thoughts or finding certain words.  A hard time solving problems or performing familiar tasks (such as how to use a telephone).  Sudden changes in mood.  Changes in personality, especially increasing moodiness or mistrust.  Depression.  A hard time understanding complex ideas that were never a problem in the past. DIAGNOSIS  There are no specific tests for dementia.   Your health care provider may recommend a thorough evaluation. This is because some forms of dementia can be reversible. The evaluation will likely include a physical exam and getting a detailed history from you and a family member. The history often gives the best clues and suggestions for a diagnosis.  Memory testing may be done. A detailed brain function evaluation called neuropsychologic testing may be helpful.  Lab tests and brain imaging (such as a CT scan or MRI scan) are sometimes important.  Sometimes observation and re-evaluation over time is very helpful. TREATMENT  Treatment depends on the cause.   If the problem is a vitamin deficiency, it may be helped or cured with supplements.  For dementias such as Alzheimer disease, medicines are available to stabilize or slow the  course of the disease. There are no cures for this type of dementia.  Your health care provider can help direct you to groups,  organizations, and other health care providers to help with decisions in the care of you or your loved one. HOME CARE INSTRUCTIONS The care of individuals with dementia is varied and dependent upon the progression of the dementia. The following suggestions are intended for the person living with, or caring for, the person with dementia.  Create a safe environment.  Remove the locks on bathroom doors to prevent the person from accidentally locking himself or herself in.  Use childproof latches on kitchen cabinets and any place where cleaning supplies, chemicals, or alcohol are kept.  Use childproof covers in unused electrical outlets.  Install childproof devices to keep doors and windows secured.  Remove stove knobs or install safety knobs and an automatic shut-off on the stove.  Lower the temperature on water heaters.  Label medicines and keep them locked up.  Secure knives, lighters, matches, power tools, and guns, and keep these items out of reach.  Keep the house free from clutter. Remove rugs or anything that might contribute to a fall.  Remove objects that might break and hurt the person.  Make sure lighting is good, both inside and outside.  Install grab rails as needed.  Use a monitoring device to alert you to falls or other needs for help.  Reduce confusion.  Keep familiar objects and people around.  Use night lights or dim lights at night.  Label items or areas.  Use reminders, notes, or directions for daily activities or tasks.  Keep a simple, consistent routine for waking, meals, bathing, dressing, and bedtime.  Create a calm, quiet environment.  Place large clocks and calendars prominently.  Display emergency numbers and home address near all telephones.  Use cues to establish different times of the day. An example is to open curtains to let the natural light in during the day.   Use effective communication.  Choose simple words and short  sentences.  Use a gentle, calm tone of voice.  Be careful not to interrupt.  If the person is struggling to find a word or communicate a thought, try to provide the word or thought.  Ask one question at a time. Allow the person ample time to answer questions. Repeat the question again if the person does not respond.  Reduce nighttime restlessness.  Provide a comfortable bed.  Have a consistent nighttime routine.  Ensure a regular walking or physical activity schedule. Involve the person in daily activities as much as possible.  Limit napping during the day.  Limit caffeine.  Attend social events that stimulate rather than overwhelm the senses.  Encourage good nutrition and hydration.  Reduce distractions during meal times and snacks.  Avoid foods that are too hot or too cold.  Monitor chewing and swallowing ability.  Continue with routine vision, hearing, dental, and medical screenings.  Give medicines only as directed by the health care provider.  Monitor driving abilities. Do not allow the person to drive when safe driving is no longer possible.  Register with an identification program which could provide location assistance in the event of a missing person situation. SEEK MEDICAL CARE IF:   New behavioral problems start such as moodiness, aggressiveness, or seeing things that are not there (hallucinations).  Any new problem with brain function happens. This includes problems with balance, speech, or falling a lot.  Problems with swallowing develop.  Any symptoms of other illness happen. Small changes or worsening in any aspect of brain function can be a sign that the illness is getting worse. It can also be a sign of another medical illness such as infection. Seeing a health care provider right away is important. SEEK IMMEDIATE MEDICAL CARE IF:   A fever develops.  New or worsened confusion develops.  New or worsened sleepiness develops.  Staying awake  becomes hard to do.   This information is not intended to replace advice given to you by your health care provider. Make sure you discuss any questions you have with your health care provider.   Document Released: 03/04/2001 Document Revised: 09/29/2014 Document Reviewed: 02/03/2011 Elsevier Interactive Patient Education [employer].

## 2016-01-02 LAB — DEMENTIA PANEL
HOMOCYSTEINE: 19.3 umol/L — AB (ref 0.0–15.0)
RPR Ser Ql: NONREACTIVE
TSH: 3.41 u[IU]/mL (ref 0.450–4.500)
VITAMIN B 12: 368 pg/mL (ref 211–946)

## 2016-01-03 ENCOUNTER — Ambulatory Visit (INDEPENDENT_AMBULATORY_CARE_PROVIDER_SITE_OTHER): Payer: Medicare Other | Admitting: *Deleted

## 2016-01-03 DIAGNOSIS — I639 Cerebral infarction, unspecified: Secondary | ICD-10-CM | POA: Diagnosis not present

## 2016-01-07 NOTE — Progress Notes (Signed)
[facility] Summary Report / Loop Recorder 

## 2016-01-21 ENCOUNTER — Ambulatory Visit (INDEPENDENT_AMBULATORY_CARE_PROVIDER_SITE_OTHER): Payer: Medicare Other | Admitting: Neurology

## 2016-01-21 DIAGNOSIS — R410 Disorientation, unspecified: Secondary | ICD-10-CM

## 2016-01-21 DIAGNOSIS — F0390 Unspecified dementia without behavioral disturbance: Secondary | ICD-10-CM

## 2016-01-21 NOTE — Procedures (Signed)
    History:  Cathy Taylor is an 82 year old patient with a history of worsening issues with short-term memory. In December 2016 she had an episode of transient slurred speech and facial weakness. The patient was felt to have a transient encephalopathy associated with the use of Remeron. The patient is being evaluated for this confusion.  This is a routine EEG. No skull defects are noted. Medications include albuterol, aspirin, Lipitor, Coreg, vitamin D supplementation, Aricept, Cymbalta, Nexium, Amaryl, lisinopril, and metformin.   EEG classification: Normal awake  Description of the recording: The background rhythms of this recording consists of a fairly well modulated medium amplitude alpha rhythm of 8 Hz that is reactive to eye opening and closure. As the record progresses, the patient appears to remain in the waking state throughout the recording. Photic stimulation was performed, resulting in a bilateral and symmetric photic driving response. Hyperventilation was not performed. At no time during the recording does there appear to be evidence of spike or spike wave discharges or evidence of focal slowing. EKG monitor shows no evidence of cardiac rhythm abnormalities with a heart rate of 66.  Impression: This is a normal EEG recording in the waking state. No evidence of ictal or interictal discharges are seen.

## 2016-01-28 ENCOUNTER — Ambulatory Visit
Admission: RE | Admit: 2016-01-28 | Discharge: 2016-01-28 | Disposition: A | Discharge status: Home / Self Care | Payer: Medicare Other | Source: Ambulatory Visit | Attending: Neurology | Admitting: Neurology

## 2016-01-28 DIAGNOSIS — F0390 Unspecified dementia without behavioral disturbance: Secondary | ICD-10-CM

## 2016-01-28 MED ORDER — GADOBENATE DIMEGLUMINE 529 MG/ML IV SOLN
12.0000 mL | Freq: Once | INTRAVENOUS | Status: AC | PRN
  Administered 2016-01-28: 12 mL via INTRAVENOUS

## 2016-01-29 ENCOUNTER — Telehealth: Payer: Self-pay | Admitting: *Deleted

## 2016-01-29 NOTE — Telephone Encounter (Signed)
Called home phone; did not leave message. Spoke with daughter, Jeannene Patella and informed her, per Dr Leonie Man,  that her mother's EEG was normal. She stated her mother had MRI today; advised those results would not be back this soon. However she will receive a call when results are back. She verbalized understanding, appreciation.

## 2016-02-04 ENCOUNTER — Ambulatory Visit (INDEPENDENT_AMBULATORY_CARE_PROVIDER_SITE_OTHER): Payer: Medicare Other | Admitting: *Deleted

## 2016-02-04 DIAGNOSIS — I639 Cerebral infarction, unspecified: Secondary | ICD-10-CM | POA: Diagnosis not present

## 2016-02-04 NOTE — Progress Notes (Signed)
[facility] Summary Report / Loop Recorder 

## 2016-02-08 ENCOUNTER — Encounter (HOSPITAL_COMMUNITY): Payer: Self-pay | Admitting: Emergency Medicine

## 2016-02-08 ENCOUNTER — Emergency Department (HOSPITAL_COMMUNITY): Payer: Medicare Other

## 2016-02-08 ENCOUNTER — Emergency Department (HOSPITAL_COMMUNITY)
Admission: EM | Admit: 2016-02-08 | Discharge: 2016-02-08 | Disposition: A | Discharge status: Home / Self Care | Payer: Medicare Other | Attending: Emergency Medicine | Admitting: Emergency Medicine

## 2016-02-08 DIAGNOSIS — E7800 Pure hypercholesterolemia, unspecified: Secondary | ICD-10-CM | POA: Insufficient documentation

## 2016-02-08 DIAGNOSIS — Z853 Personal history of malignant neoplasm of breast: Secondary | ICD-10-CM | POA: Insufficient documentation

## 2016-02-08 DIAGNOSIS — F419 Anxiety disorder, unspecified: Secondary | ICD-10-CM | POA: Diagnosis not present

## 2016-02-08 DIAGNOSIS — K219 Gastro-esophageal reflux disease without esophagitis: Secondary | ICD-10-CM | POA: Insufficient documentation

## 2016-02-08 DIAGNOSIS — Z8673 Personal history of transient ischemic attack (TIA), and cerebral infarction without residual deficits: Secondary | ICD-10-CM | POA: Insufficient documentation

## 2016-02-08 DIAGNOSIS — R55 Syncope and collapse: Secondary | ICD-10-CM

## 2016-02-08 DIAGNOSIS — F329 Major depressive disorder, single episode, unspecified: Secondary | ICD-10-CM | POA: Insufficient documentation

## 2016-02-08 DIAGNOSIS — R011 Cardiac murmur, unspecified: Secondary | ICD-10-CM | POA: Diagnosis not present

## 2016-02-08 DIAGNOSIS — R42 Dizziness and giddiness: Secondary | ICD-10-CM | POA: Diagnosis not present

## 2016-02-08 DIAGNOSIS — M1990 Unspecified osteoarthritis, unspecified site: Secondary | ICD-10-CM | POA: Diagnosis not present

## 2016-02-08 DIAGNOSIS — N390 Urinary tract infection, site not specified: Secondary | ICD-10-CM | POA: Diagnosis not present

## 2016-02-08 DIAGNOSIS — R5383 Other fatigue: Secondary | ICD-10-CM | POA: Diagnosis present

## 2016-02-08 DIAGNOSIS — E119 Type 2 diabetes mellitus without complications: Secondary | ICD-10-CM | POA: Diagnosis not present

## 2016-02-08 DIAGNOSIS — G8929 Other chronic pain: Secondary | ICD-10-CM | POA: Insufficient documentation

## 2016-02-08 DIAGNOSIS — Z8781 Personal history of (healed) traumatic fracture: Secondary | ICD-10-CM | POA: Diagnosis not present

## 2016-02-08 DIAGNOSIS — I10 Essential (primary) hypertension: Secondary | ICD-10-CM | POA: Diagnosis not present

## 2016-02-08 DIAGNOSIS — Z7982 Long term (current) use of aspirin: Secondary | ICD-10-CM | POA: Diagnosis not present

## 2016-02-08 DIAGNOSIS — Z8709 Personal history of other diseases of the respiratory system: Secondary | ICD-10-CM | POA: Diagnosis not present

## 2016-02-08 DIAGNOSIS — Z7984 Long term (current) use of oral hypoglycemic drugs: Secondary | ICD-10-CM | POA: Insufficient documentation

## 2016-02-08 DIAGNOSIS — Z79899 Other long term (current) drug therapy: Secondary | ICD-10-CM | POA: Diagnosis not present

## 2016-02-08 LAB — URINALYSIS, ROUTINE W REFLEX MICROSCOPIC
Bilirubin Urine: NEGATIVE
Glucose, UA: NEGATIVE mg/dL
Hgb urine dipstick: NEGATIVE
KETONES UR: NEGATIVE mg/dL
NITRITE: POSITIVE — AB
PH: 6.5 (ref 5.0–8.0)
Protein, ur: NEGATIVE mg/dL
SPECIFIC GRAVITY, URINE: 1.014 (ref 1.005–1.030)

## 2016-02-08 LAB — COMPREHENSIVE METABOLIC PANEL
ALBUMIN: 3.4 g/dL — AB (ref 3.5–5.0)
ALT: 25 U/L (ref 14–54)
AST: 29 U/L (ref 15–41)
Alkaline Phosphatase: 64 U/L (ref 38–126)
Anion gap: 14 (ref 5–15)
BUN: 9 mg/dL (ref 6–20)
CHLORIDE: 102 mmol/L (ref 101–111)
CO2: 24 mmol/L (ref 22–32)
CREATININE: 0.78 mg/dL (ref 0.44–1.00)
Calcium: 9.9 mg/dL (ref 8.9–10.3)
GFR calc Af Amer: >60 mL/min (ref >60)
GFR calc non Af Amer: >60 mL/min (ref >60)
GLUCOSE: 118 mg/dL — AB (ref 65–99)
POTASSIUM: 4.2 mmol/L (ref 3.5–5.1)
Sodium: 140 mmol/L (ref 135–145)
Total Bilirubin: 0.6 mg/dL (ref 0.3–1.2)
Total Protein: 7.3 g/dL (ref 6.5–8.1)

## 2016-02-08 LAB — CBC WITH DIFFERENTIAL/PLATELET
Basophils Absolute: 0.1 10*3/uL (ref 0.0–0.1)
Basophils Relative: 1 %
EOS ABS: 0.5 10*3/uL (ref 0.0–0.7)
EOS PCT: 4 %
HEMATOCRIT: 40.5 % (ref 36.0–46.0)
HEMOGLOBIN: 12.7 g/dL (ref 12.0–15.0)
LYMPHS ABS: 3.2 10*3/uL (ref 0.7–4.0)
LYMPHS PCT: 26 %
MCH: 27.2 pg (ref 26.0–34.0)
MCHC: 31.4 g/dL (ref 30.0–36.0)
MCV: 86.7 fL (ref 78.0–100.0)
MONO ABS: 0.7 10*3/uL (ref 0.1–1.0)
MONOS PCT: 5 %
Neutro Abs: 7.8 10*3/uL — ABNORMAL HIGH (ref 1.7–7.7)
Neutrophils Relative %: 64 %
Platelets: 257 10*3/uL (ref 150–400)
RBC: 4.67 MIL/uL (ref 3.87–5.11)
RDW: 15.3 % (ref 11.5–15.5)
WBC: 12.1 10*3/uL — AB (ref 4.0–10.5)

## 2016-02-08 LAB — I-STAT TROPONIN, ED: TROPONIN I, POC: 0 ng/mL (ref 0.00–0.08)

## 2016-02-08 LAB — URINE MICROSCOPIC-ADD ON: RBC / HPF: NONE SEEN RBC/hpf (ref 0–5)

## 2016-02-08 MED ORDER — CEPHALEXIN 500 MG PO CAPS: 500.0000 mg | ORAL_CAPSULE | Freq: Four times a day (QID) | ORAL | Status: DC

## 2016-02-08 MED FILL — CEPHALEXIN 500 MG CAPSULE: 500 | 10 days supply | Qty: 40 | Fill #0

## 2016-02-08 NOTE — ED Provider Notes (Signed)
CSN: [id number]     Arrival date & time 02/08/16  [id number] History   First MD Initiated Contact with Patient 02/08/16 1001     Chief Complaint  Patient presents with  . Fatigue     (Consider location/radiation/quality/duration/timing/severity/associated sxs/prior Treatment) Patient is a 82 y.o. female presenting with near-syncope. The history is provided by the patient.  Near Syncope This is a new problem. The current episode started 6 to 12 hours ago. The problem occurs constantly. The problem has not changed since onset.Pertinent negatives include no chest pain, no headaches and no shortness of breath. Nothing aggravates the symptoms. Nothing relieves the symptoms. She has tried nothing for the symptoms. The treatment provided no relief.   82 yo F with with near syncope.  Occurred after having large BM.  Stood up and felt light headed.  Denies chest pain, sob.  Was diaphoretic and weak post.  Now asymptomatic.   Past Medical History  Diagnosis Date  . Stress fracture of left femur with nonunion 07/08/2011  . PMR (polymyalgia rheumatica) (HCC)   . GERD (gastroesophageal reflux disease)   . Anxiety   . Hypertension   . Abdominal hernia   . Hypercholesteremia   . Heart murmur   . Chronic back pain greater than 3 months duration   . Bronchiectasis 11/12  . 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 Depression   . Bronchitis        . Type II diabetes mellitus ([place])   . Umbilical hernia [id number]    unrepaired  . Breast cancer ([place]) 1973    right  . Arthritis   . Osteoarthritis of left shoulder 01/06/2012  . Stroke ([place])   . Left ventricular dysfunction 04/2015    EF 30% by TEE, + AK mid-apical anteroseptal and anterior walls plus apex, neg bubble study, grade III plaque asc Ao, suspect ICM   Past Surgical History  Procedure Laterality Date  . Tubal ligation    . Cataract extraction w/ intraocular lens implant  07/2011    left  . Shoulder arthroscopy  06/2000    left  . Femur surgery   07/07/2011    left rod placed  . Total shoulder arthroplasty  01/06/12    left  . Breast biopsy  1981    left  . Mastectomy  1973    right  . Total shoulder arthroplasty  01/06/2012    Procedure: TOTAL SHOULDER ARTHROPLASTY;  Surgeon: Johnny Bridge, MD;  Location: [facility];  Service: Orthopedics;  Laterality: Left;  . Ep implantable device N/A 05/08/2015    Procedure: Loop Recorder Insertion;  Surgeon: Will Meredith Leeds, MD;  Location: [facility];  Service: Cardiovascular;  Laterality: N/A;  . Tee without cardioversion N/A 05/07/2015    Procedure: TRANSESOPHAGEAL ECHOCARDIOGRAM (TEE);  Surgeon: Larey Dresser, MD;  EF 30%, +WMA, no thrombus/PFO, suspect ICM     Family History  Problem Relation Age of Onset  . Heart attack Father   . Stroke Son   . Stroke Maternal Aunt    Social History  Substance Use Topics  . Smoking status: Never Smoker   . Smokeless tobacco: Former Systems developer    Types: Snuff    Quit date: 09/22/1948     Comment: "just tried smoking; didn't smoke to amount to nothing"  . Alcohol Use: No   OB History    No data available     Review of Systems  Constitutional: Negative for fever and  chills.  HENT: Negative for congestion and rhinorrhea.   Eyes: Negative for redness and visual disturbance.  Respiratory: Negative for shortness of breath and wheezing.   Cardiovascular: Positive for near-syncope. Negative for chest pain and palpitations.  Gastrointestinal: Negative for nausea and vomiting.  Genitourinary: Negative for dysuria and urgency.  Musculoskeletal: Negative for myalgias and arthralgias.  Skin: Negative for pallor and wound.  Neurological: Negative for dizziness and headaches.      Allergies  Review of patient's allergies indicates no known allergies.  Home Medications   Prior to Admission medications   Medication Sig Start Date End Date Taking? Authorizing Provider  acetaminophen (TYLENOL) 500 MG tablet Take 500 mg by mouth every 6 (six)  hours as needed. For pain     Historical Provider, MD  albuterol (PROVENTIL HFA;VENTOLIN HFA) 108 (90 BASE) MCG/ACT inhaler Inhale 2 puffs into the lungs every 6 (six) hours as needed. Shortness of breath    Historical Provider, MD  aspirin 325 MG tablet Take 1 tablet (325 mg total) by mouth daily. 05/09/15   Geradine Girt, DO  atorvastatin (LIPITOR) 80 MG tablet Take 1 tablet (80 mg total) by mouth daily at 6 PM. 05/09/15   Geradine Girt, DO  carvedilol (COREG) 6.25 MG tablet Take 1 tablet (6.25 mg total) by mouth 2 (two) times daily with a meal. 05/09/15   Geradine Girt, DO  cephALEXin (KEFLEX) 500 MG capsule Take 1 capsule (500 mg total) by mouth 4 (four) times daily. 02/08/16   Deno Etienne, DO  Cholecalciferol (VITAMIN D-3 PO) Take 1,000 mg by mouth daily.     Historical Provider, MD  diclofenac sodium (VOLTAREN) 1 % GEL Apply 1 application topically 4 (four) times daily as needed. Pain in shoulder    Historical Provider, MD  donepezil (ARICEPT) 5 MG tablet Take 1 tablet (5 mg total) by mouth at bedtime. Start 1 tablet daily x 4 weeks then two tablets daily 01/01/16   Garvin Fila, MD  DULoxetine (CYMBALTA) 60 MG capsule Take 1 capsule (60 mg total) by mouth daily. 07/02/15   Garvin Fila, MD  esomeprazole (NEXIUM) 20 MG capsule Take 20 mg by mouth 2 (two) times daily before a meal.    Historical Provider, MD  glimepiride (AMARYL) 1 MG tablet Take 1 mg by mouth daily with breakfast.    Historical Provider, MD  lisinopril (PRINIVIL,ZESTRIL) 5 MG tablet Take 1 tablet (5 mg total) by mouth daily. 05/23/15   Rhonda G Barrett, PA-C  metFORMIN (GLUCOPHAGE) 500 MG tablet Take 1,000 mg by mouth 2 (two) times daily. Universal healthcare papers states 1000 mg tabs twice a day.. Pt states pill are 500 mg 2 in am 1 in pm    Historical Provider, MD   BP 121/65 mmHg  Pulse 60  Temp(Src) 97.9 F (36.6 C) (Oral)  Resp 18  Ht 5\' 4"  (1.626 m)  Wt 130 lb (58.968 kg)  BMI 22.30 kg/m2  SpO2 99% Physical Exam   Constitutional: She is oriented to person, place, and time. She appears well-developed and well-nourished. No distress.  HENT:  Head: Normocephalic and atraumatic.  Eyes: EOM are normal. Pupils are equal, round, and reactive to light.  Neck: Normal range of motion. Neck supple.  Cardiovascular: Normal rate and regular rhythm.  Exam reveals no gallop and no friction rub.   No murmur heard. Pulmonary/Chest: Effort normal. She has no wheezes. She has no rales.  Abdominal: Soft. She exhibits no distension. There is no  tenderness.  Musculoskeletal: She exhibits no edema or tenderness.  Neurological: She is alert and oriented to person, place, and time.  Skin: Skin is warm and dry. She is not diaphoretic.  Psychiatric: She has a normal mood and affect. Her behavior is normal.  Nursing note and vitals reviewed.   ED Course  Procedures (including critical care time) Labs Review Labs Reviewed  COMPREHENSIVE METABOLIC PANEL - Abnormal; Notable for the following:    Glucose, Bld 118 (*)    Albumin 3.4 (*)    All other components within normal limits  CBC WITH DIFFERENTIAL/PLATELET - Abnormal; Notable for the following:    WBC 12.1 (*)    Neutro Abs 7.8 (*)    All other components within normal limits  URINALYSIS, ROUTINE W REFLEX MICROSCOPIC (NOT AT [facility]) - Abnormal; Notable for the following:    APPearance TURBID (*)    Nitrite POSITIVE (*)    Leukocytes, UA LARGE (*)    All other components within normal limits  URINE MICROSCOPIC-ADD ON - Abnormal; Notable for the following:    Squamous Epithelial / LPF 0-5 (*)    Bacteria, UA MANY (*)    All other components within normal limits  I-STAT TROPOININ, ED    Imaging Review Dg Chest 2 View  02/08/2016  CLINICAL DATA:  Weakness that started today. EXAM: CHEST  2 VIEW COMPARISON:  09/04/2015 FINDINGS: Again noted is a left shoulder arthroplasty. Evidence for significant degenerative changes at the right glenohumeral joint with a large  osteophyte along the medial aspect of the humeral head. Both lungs are clear. Heart and mediastinum are within normal limits. There is a cardiac recorder along the anterior chest soft tissues. Multilevel degenerative changes in the thoracic and upper lumbar spine. Atherosclerotic calcifications in the abdominal aorta. No large pleural effusions. IMPRESSION: No active cardiopulmonary disease. Degenerative changes as described. Electronically Signed   By: Markus Daft M.D.   On: 02/08/2016 10:28   I have personally reviewed and evaluated these images and lab results as part of my medical decision-making.   EKG Interpretation   Date/Time:  Friday Feb 08 2016 09:42:53 EDT Ventricular Rate:  55 PR Interval:  190 QRS Duration: 89 QT Interval:  449 QTC Calculation: 429 R Axis:   -72 Text Interpretation:  Sinus rhythm Left anterior fascicular block  Anteroseptal infarct, age indeterminate No significant change since last  tracing Confirmed by Reah Justo MD, DANIEL [phone]) on 02/08/2016 10:01:01 AM      MDM   Final diagnoses:  UTI (lower urinary tract infection)  Vagal reaction    82 yo F with near syncopal event, sounds likely vagal by hx.  Found to have UTI on workup, will treat.  PCP follow up.   I have discussed the diagnosis/risks/treatment options with the patient and family and believe the pt to be eligible for discharge home to follow-up with PCP. We also discussed returning to the ED immediately if new or worsening sx occur. We discussed the sx which are most concerning (e.g., sudden worsening pain, fever, inability to tolerate by mouth) that necessitate immediate return. Medications administered to the patient during their visit and any new prescriptions provided to the patient are listed below.  Medications given during this visit Medications - No data to display  Discharge Medication List as of 02/08/2016 12:11 PM    START taking these medications   Details  cephALEXin (KEFLEX) 500 MG  capsule Take 1 capsule (500 mg total) by mouth 4 (four) times daily.,  Starting 02/08/2016, Until Discontinued, Print        The patient appears reasonably screen and/or stabilized for discharge and I doubt any other medical condition or other [facility] requiring further screening, evaluation, or treatment in the ED at this time prior to discharge.      Deno Etienne, DO 02/08/16 2006

## 2016-02-08 NOTE — Discharge Instructions (Signed)
Near-Syncope Near-syncope (commonly known as near fainting) is sudden weakness, dizziness, or feeling like you might pass out. This can happen when getting up or while standing for a long time. It is caused by a sudden decrease in blood flow to the brain, which can occur for various reasons. Most of the reasons are not serious.  HOME CARE Watch your condition for any changes.  Have someone stay with you until you feel stable.  If you feel like you are going to pass out:  Lie down right away.  Prop your feet up if you can.  Breathe deeply and steadily.  Move only when the feeling has gone away. Most of the time, this feeling lasts only a few minutes. You may feel tired for several hours.  Drink enough fluids to keep your pee (urine) clear or pale yellow.  If you are taking blood pressure or heart medicine, stand up slowly.  Follow up with your doctor as told. GET HELP RIGHT AWAY IF:   You have a severe headache.  You have unusual pain in the chest, belly (abdomen), or back.  You have bleeding from the mouth or butt (rectum), or you have black or tarry poop (stool).  You feel your heart beat differently than normal, or you have a very fast pulse.  You pass out, or you twitch and shake when you pass out.  You pass out when sitting or lying down.  You feel confused.  You have trouble walking.  You are weak.  You have vision problems. MAKE SURE YOU:   Understand these instructions.  Will watch your condition.  Will get help right away if you are not doing well or get worse.   This information is not intended to replace advice given to you by your health care provider. Make sure you discuss any questions you have with your health care provider.   Document Released: 02/25/2008 Document Revised: 09/29/2014 Document Reviewed: 02/11/2013 Elsevier Interactive Patient Education 2016 Elsevier Inc.  

## 2016-02-08 NOTE — ED Notes (Signed)
EKG completed given to EDP.  

## 2016-02-08 NOTE — ED Notes (Signed)
Arrived via EMS. Onset today live at home with full time care takers. Woke up at 0630 and at 0700 had a large BM. After BM patient had symptoms of nausea, sweating, and general weakness.  Patient alert to her normal history of CVA slurred speech and right side weakness. States only complaint is tired general weakness.

## 2016-02-17 LAB — CUP PACEART REMOTE DEVICE CHECK: Date Time Interrogation Session: 20170413133632

## 2016-02-17 NOTE — Progress Notes (Signed)
Carelink summary report received. Battery status OK. Normal device function. No new symptom episodes, tachy episodes, brady, or pause episodes. No new AF episodes. Monthly summary reports and ROV/PRN 

## 2016-03-03 ENCOUNTER — Ambulatory Visit (INDEPENDENT_AMBULATORY_CARE_PROVIDER_SITE_OTHER): Payer: Medicare Other | Admitting: *Deleted

## 2016-03-03 DIAGNOSIS — I639 Cerebral infarction, unspecified: Secondary | ICD-10-CM | POA: Diagnosis not present

## 2016-03-03 NOTE — Progress Notes (Signed)
[facility] Summary Report / Loop Recorder 

## 2016-03-07 ENCOUNTER — Telehealth: Payer: Self-pay | Admitting: Interventional Cardiology

## 2016-03-07 NOTE — Telephone Encounter (Signed)
Returned call to patient's daughter Cathy Taylor.Stated she called to schedule mother appointment with Dr.Smith in July was told he does not have any openings until Sept.Stated she wanted her mother seen before then.Stated she wants to see Dr.Smith not a PA or NP.Advised I will send message to Dr.Smith's nurse.She is out of office today.Advised she will call back next week.

## 2016-03-07 NOTE — Telephone Encounter (Signed)
New message   Pt hands / top of her fingers are turning blue. And she stays really cold. Maybe due to the stroke she has in 04/2015. No appetite. Per daughter pt seen by PCP on Wednesday. Found that she lost 7 lbs since April. Request a call back to discuss a sooner appt in July vs September and with Dr. Tamala Julian. Pt daughter is declining PA or NP. Please assist.

## 2016-03-11 LAB — CUP PACEART REMOTE DEVICE CHECK: MDC IDC SESS DTM: 20170513133739

## 2016-03-18 NOTE — Telephone Encounter (Signed)
Returned call to pt's daughter. Lmtcb. Called to offer work in appt with [facility] for 7/17 @ 10:15am

## 2016-03-27 LAB — CUP PACEART REMOTE DEVICE CHECK: Date Time Interrogation Session: 20170612140523

## 2016-03-31 ENCOUNTER — Telehealth: Payer: Self-pay

## 2016-03-31 NOTE — Telephone Encounter (Signed)
I spoke to Cathy Taylor. She is unable to reschedule right now. Appt with Hoyle Sauer on Friday but she will be ooo. [name] will call back to reschedule that appt.

## 2016-03-31 NOTE — Telephone Encounter (Signed)
[name] called back and r/s'd appt for July 25th at 1130a.

## 2016-04-02 ENCOUNTER — Ambulatory Visit (INDEPENDENT_AMBULATORY_CARE_PROVIDER_SITE_OTHER): Payer: Medicare Other | Admitting: *Deleted

## 2016-04-02 DIAGNOSIS — I639 Cerebral infarction, unspecified: Secondary | ICD-10-CM

## 2016-04-02 NOTE — Progress Notes (Signed)
[facility] Summary Report / Loop Recorder 

## 2016-04-04 ENCOUNTER — Ambulatory Visit: Payer: Medicare Other | Admitting: Nurse Practitioner

## 2016-04-06 NOTE — Progress Notes (Signed)
Cardiology Office Note    Date:  04/07/2016   ID:  [name], DOB [birth year], MRN [id number]  PCP:  Kandice Hams, MD  Cardiologist: Sinclair Grooms, MD   Chief Complaint  Patient presents with  . Congestive Heart Failure    History of Present Illness:  Cathy Taylor is a 82 y.o. female for follow up of history of diabetes, hypertension, PMR and breast cancer. She was admitted for a CVA and had a loop recorder implanted(8/16) as well as suspected ischemic cardiomyopathy with an EF of 30-35 percent and peak troponin of 1.46. Subsequent EF 65-70% 08/2015 on medical therapy.  Patient has dementia. We are not able to communicate in a meaningful way. Her history is not very specific. She is accompanied by son-in-law and granddaughter. They're concerned about weight loss. There are no cardiopulmonary complaints. No recent neurological symptoms.  Past Medical History  Diagnosis Date  . Stress fracture of left femur with nonunion 07/08/2011  . PMR (polymyalgia rheumatica) (HCC)   . GERD (gastroesophageal reflux disease)   . Anxiety   . Hypertension   . Abdominal hernia   . Hypercholesteremia   . Heart murmur   . Chronic back pain greater than 3 months duration   . Bronchiectasis 11/12  . 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 Depression   . Bronchitis        . Type II diabetes mellitus ([place])   . Umbilical hernia [id number]    unrepaired  . Breast cancer ([place]) 1973    right  . Arthritis   . Osteoarthritis of left shoulder 01/06/2012  . Stroke ([place])   . Left ventricular dysfunction 04/2015    EF 30% by TEE, + AK mid-apical anteroseptal and anterior walls plus apex, neg bubble study, grade III plaque asc Ao, suspect ICM    Past Surgical History  Procedure Laterality Date  . Tubal ligation    . Cataract extraction w/ intraocular lens implant  07/2011    left  . Shoulder arthroscopy  06/2000    left  . Femur surgery  07/07/2011    left rod placed  . Total shoulder  arthroplasty  01/06/12    left  . Breast biopsy  1981    left  . Mastectomy  1973    right  . Total shoulder arthroplasty  01/06/2012    Procedure: TOTAL SHOULDER ARTHROPLASTY;  Surgeon: Johnny Bridge, MD;  Location: [facility];  Service: Orthopedics;  Laterality: Left;  . Ep implantable device N/A 05/08/2015    Procedure: Loop Recorder Insertion;  Surgeon: Will Meredith Leeds, MD;  Location: [facility];  Service: Cardiovascular;  Laterality: N/A;  . Tee without cardioversion N/A 05/07/2015    Procedure: TRANSESOPHAGEAL ECHOCARDIOGRAM (TEE);  Surgeon: Larey Dresser, MD;  EF 30%, +WMA, no thrombus/PFO, suspect ICM      Current Medications: Outpatient Prescriptions Prior to Visit  Medication Sig Dispense Refill  . acetaminophen (TYLENOL) 500 MG tablet Take 500 mg by mouth every 6 (six) hours as needed. For pain     . albuterol (PROVENTIL HFA;VENTOLIN HFA) 108 (90 BASE) MCG/ACT inhaler Inhale 2 puffs into the lungs every 6 (six) hours as needed. Shortness of breath    . atorvastatin (LIPITOR) 80 MG tablet Take 1 tablet (80 mg total) by mouth daily at 6 PM.    . carvedilol (COREG) 6.25 MG tablet Take 1 tablet (6.25 mg total) by mouth 2 (two) times daily with a  meal.    . Cholecalciferol (VITAMIN D-3 PO) Take 1,000 mg by mouth daily.     . diclofenac sodium (VOLTAREN) 1 % GEL Apply 1 application topically 4 (four) times daily as needed. Pain in shoulder    . donepezil (ARICEPT) 5 MG tablet Take 1 tablet (5 mg total) by mouth at bedtime. Start 1 tablet daily x 4 weeks then two tablets daily 60 tablet 3  . DULoxetine (CYMBALTA) 60 MG capsule Take 1 capsule (60 mg total) by mouth daily. 30 capsule 5  . esomeprazole (NEXIUM) 20 MG capsule Take 20 mg by mouth 2 (two) times daily before a meal.    . lisinopril (PRINIVIL,ZESTRIL) 5 MG tablet Take 1 tablet (5 mg total) by mouth daily. 90 tablet 3  . metFORMIN (GLUCOPHAGE) 500 MG tablet Take 1,000 mg by mouth 2 (two) times daily. Universal healthcare  papers states 1000 mg tabs twice a day.. Pt states pill are 500 mg 2 in am 1 in pm    . aspirin 325 MG tablet Take 1 tablet (325 mg total) by mouth daily.    . cephALEXin (KEFLEX) 500 MG capsule Take 1 capsule (500 mg total) by mouth 4 (four) times daily. (Patient not taking: Reported on 04/07/2016) 40 capsule 0  . glimepiride (AMARYL) 1 MG tablet Take 1 mg by mouth daily with breakfast. Reported on 04/07/2016     No facility-administered medications prior to visit.     Allergies:   Review of patient's allergies indicates no known allergies.   Social History   Social History  . Marital Status: Married    Spouse Name: N/A  . Number of Children: N/A  . Years of Education: N/A   Social History Main Topics  . Smoking status: Never Smoker   . Smokeless tobacco: Former Systems developer    Types: Snuff    Quit date: 09/22/1948     Comment: "just tried smoking; didn't smoke to amount to nothing"  . Alcohol Use: No  . Drug Use: No  . Sexual Activity: Not Currently   Other Topics Concern  . None   Social History Narrative   Currently lives with her husband who has Parkinson's Disease and Dementia. Their children live nearby. They have home health caregivers who assist them throughout the week as well.     Family History:  The patient's family history includes Heart attack in her father; Stroke in her maternal aunt and son.   ROS:   Please see the history of present illness.    10 pound weight loss since April and nearly 30 pound weight loss over the past year. Appetite is significantly decreased. Decreased memory. Not sleeping well. Lives at home with her husband who has Parkinson's. All other systems reviewed and are negative.   PHYSICAL EXAM:   VS:  BP 110/58 mmHg  Pulse 60  Ht 5\' 4"  (1.626 m)  Wt 122 lb 6.4 oz (55.52 kg)  BMI 21.00 kg/m2   GEN: Well nourished, well developed, in no acute distress HEENT: normal Neck: no JVD, carotid bruits, or masses Cardiac: RRR; no murmurs, rubs, or  gallops,no edema  Respiratory:  clear to auscultation bilaterally, normal work of breathing GI: soft, nontender, nondistended, + BS MS: no deformity or atrophy Skin: warm and dry, no rash Neuro:  Alert and Oriented x 3, Strength and sensation are intact Psych: euthymic mood, full affect  Wt Readings from Last 3 Encounters:  04/07/16 122 lb 6.4 oz (55.52 kg)  02/08/16 130 lb (58.968  kg)  01/01/16 132 lb (59.875 kg)      Studies/Labs Reviewed:   EKG:  EKG  From 02/14/16, reveals sinus bradycardia, left anterior hemiblock, otherwise unremarkable.  Recent Labs: 01/01/2016: TSH 3.410 02/08/2016: ALT 25; BUN 9; Creatinine, Ser 0.78; Hemoglobin 12.7; Platelets 257; Potassium 4.2; Sodium 140   Lipid Panel    Component Value Date/Time   CHOL 82 09/04/2015 1335   TRIG 81 09/04/2015 1335   HDL 29* 09/04/2015 1335   CHOLHDL 2.8 09/04/2015 1335   VLDL 16 09/04/2015 1335   LDLCALC 37 09/04/2015 1335    Additional studies/ records that were reviewed today include:  ECHOCARDIOGRAM 09/04/15: Study Conclusions  - Left ventricle: The cavity size was normal. There was moderate  concentric hypertrophy. Systolic function was vigorous. The  estimated ejection fraction was in the range of 65% to 70%. Wall  motion was normal; there were no regional wall motion  abnormalities. There was an increased relative contribution of  atrial contraction to ventricular filling. Doppler parameters are  consistent with abnormal left ventricular relaxation (grade 1  diastolic dysfunction). - Aortic valve: Trileaflet; mildly thickened, mildly calcified  leaflets. - Mitral valve: Calcified annulus. - Atrial septum: There was increased thickness of the septum,  consistent with lipomatous hypertrophy.    ASSESSMENT:    1. Cardiomyopathy ([place])   2. Essential hypertension   3. Cerebrovascular accident (CVA) due to embolism of carotid artery, unspecified blood vessel laterality (HCC)   4. Status  post placement of implantable loop recorder      PLAN:  In order of problems listed above:  1. LV systolic dysfunction has resolved. Was possibly related to acute illness. On beta blocker therapy and low-dose ACE inhibitor therapy, LVEF is not greater than 60% compared to 30-35% in August 2016. Plan is to continue low-dose beta blocker and ace therapy. 2. Blood pressure is well-controlled. Low salt diet as advocated. Continue medications as listed above. 3. No recurrent ischemic brain events. Decrease aspirin to 81 mg per day. 4. Loop recorder without abnormalities in rhythm to this point.    Medication Adjustments/Labs and Tests Ordered: Current medicines are reviewed at length with the patient today.  Concerns regarding medicines are outlined above.  Medication changes, Labs and Tests ordered today are listed in the Patient Instructions below. Patient Instructions  Medication Instructions:  Your physician recommends that you continue on your current medications as directed. Please refer to the Current Medication list given to you today.   Labwork: None ordered  Testing/Procedures: None ordered  Follow-Up: Your physician wants you to follow-up in: 1 year Dr.Smith You will receive a reminder letter in the mail two months in advance. If you don't receive a letter, please call our office to schedule the follow-up appointment.   Any Other Special Instructions Will Be Listed Below (If Applicable).     If you need a refill on your cardiac medications before your next appointment, please call your pharmacy.       Signed, Sinclair Grooms, MD  04/07/2016 11:02 AM    [facility] [place], [place], [place]  [address] Phone: [phone]; Fax: [phone]

## 2016-04-07 ENCOUNTER — Encounter: Payer: Self-pay | Admitting: Interventional Cardiology

## 2016-04-07 ENCOUNTER — Encounter (INDEPENDENT_AMBULATORY_CARE_PROVIDER_SITE_OTHER): Payer: Self-pay

## 2016-04-07 ENCOUNTER — Ambulatory Visit (INDEPENDENT_AMBULATORY_CARE_PROVIDER_SITE_OTHER): Payer: Medicare Other | Admitting: Interventional Cardiology

## 2016-04-07 VITALS — BP 110/58 | HR 60 | Ht 64.0 in | Wt 122.4 lb

## 2016-04-07 DIAGNOSIS — Z95818 Presence of other cardiac implants and grafts: Secondary | ICD-10-CM | POA: Diagnosis not present

## 2016-04-07 DIAGNOSIS — I429 Cardiomyopathy, unspecified: Secondary | ICD-10-CM

## 2016-04-07 DIAGNOSIS — I63139 Cerebral infarction due to embolism of unspecified carotid artery: Secondary | ICD-10-CM

## 2016-04-07 DIAGNOSIS — I10 Essential (primary) hypertension: Secondary | ICD-10-CM | POA: Diagnosis not present

## 2016-04-07 MED ORDER — ASPIRIN 81 MG PO TABS
81.0000 mg | ORAL_TABLET | Freq: Every day | ORAL | Status: DC
Start: 2016-04-07 — End: 2018-09-18

## 2016-04-07 NOTE — Patient Instructions (Signed)
Medication Instructions:  Your physician recommends that you continue on your current medications as directed. Please refer to the Current Medication list given to you today.   Labwork: None ordered  Testing/Procedures: None ordered  Follow-Up: Your physician wants you to follow-up in: 1 year Dr.Smith You will receive a reminder letter in the mail two months in advance. If you don't receive a letter, please call our office to schedule the follow-up appointment.   Any Other Special Instructions Will Be Listed Below (If Applicable).     If you need a refill on your cardiac medications before your next appointment, please call your pharmacy.

## 2016-04-12 LAB — CUP PACEART REMOTE DEVICE CHECK: Date Time Interrogation Session: 20170712150653

## 2016-04-15 ENCOUNTER — Encounter: Payer: Self-pay | Admitting: Nurse Practitioner

## 2016-04-15 ENCOUNTER — Ambulatory Visit (INDEPENDENT_AMBULATORY_CARE_PROVIDER_SITE_OTHER): Payer: Medicare Other | Admitting: Nurse Practitioner

## 2016-04-15 VITALS — BP 136/68 | HR 60 | Ht 64.0 in | Wt 122.8 lb

## 2016-04-15 DIAGNOSIS — I639 Cerebral infarction, unspecified: Secondary | ICD-10-CM | POA: Diagnosis not present

## 2016-04-15 DIAGNOSIS — R29898 Other symptoms and signs involving the musculoskeletal system: Secondary | ICD-10-CM

## 2016-04-15 DIAGNOSIS — R200 Anesthesia of skin: Secondary | ICD-10-CM

## 2016-04-15 DIAGNOSIS — I10 Essential (primary) hypertension: Secondary | ICD-10-CM

## 2016-04-15 NOTE — Patient Instructions (Addendum)
Will set up for home health care physical therapy evaluate and treat Aricept 10 mg 1 tablet daily for memory Continue aspirin 0.81 daily for secondary stroke prevention recently changed by cardiologist Blood pressure control below 130/90 today's reading 136/68 LDL cholesterol goal below 70 Follow-up in 6 months

## 2016-04-15 NOTE — Progress Notes (Addendum)
[facility]  PATIENT: Cathy Taylor DOB: [birth year]   REASON FOR VISIT: Follow-up for stroke memory loss HISTORY FROM: Patient and granddaughter  HISTORY OF PRESENT ILLNESS:Last visit 10/10/2016PS : Cathy Taylor is seen today for first office follow-up visit following admission to [facility] in August 2016 with stroke. Cathy Taylor is an 82 y.o. female with a istory of hypertension, hyperlipidemia, diabetes mellitus, breast cancer and polymyalgia rheumatica, who presented with vertigo with nausea she was also noted to have slurred speech and confusion and was sent to the emergency room for evaluation from her Primary care physician's office. No focal weakness was noted. She did not experience diplopia. CT scan of her head showed no acute intracranial abnormality. She was noted to have pinpoint pupils on arrival in the emergency room. Urine drug screen was negativeg. Patient has not been on antiplatelets therapy. She had no history of stroke nor TIA. MRI of the brain showed a large acute right cerebellar ischemic infarction. LSN: 05/03/2015 tPA Given: No: Beyond time window for treatment consideration. She was admitted to the neurological unit where she remained stable. MRI scan of the brain showed an acute large superior cerebellar artery infarct on the right side. There is no hydrocephalus. MRA showed occlusion of the right superior cerebellar artery proximally. CT angiogram showed 75% stenosis of the right ICA with dense atheromatous calcification in less than 50% stenosis of left ICA. Carotid ultrasound showed only however 40-59% ICA stenosis bilaterally with the acoustic shadowing due to calcific plaques which may obscure higher velocities. CT angiogram hence was obtained which confirm 75% right ICA stenosis which was asymptomatic. LDL cholesterol was elevated at 88 mg percent. Transthoracic echo showed ejection fraction of 30-35% but no cardiac source of emboli.  Transesophageal echocardiogram confirmed low cardiac ejection fraction but showed no cardiac source of embolism. Hemoglobin A1c was 6.6. Patient had loop recorder inserted for paroxysmal A. fib and so far negative has not had been found. Patient was transferred to skilled nursing facility for inpatient rehabilitation and made gradual improvement and is currently at home for the last 1 week. She is able to walk with a walker but needs a one-person assist. She has lack of motivation and has not been eating well due to decreased appetite and nausea. Patient is also under a lot of stress, husband is not well. She does take Cymbalta 30 mg daily but has not yet tried a higher dose. The family feels that is not helping. She still has some dysarthria and right hemiparesis and dysarthria seems to get worse in the afternoon when she is tired. Update 4/11/2017PS : She returns for follow-up today after last visit 6 months ago. She is accompanied by her granddaughter and great-granddaughter. Patient continues to live at home but now she has 24-hour care. She's had a few falls when she is not careful. She does use a walker mostly but needs 1 person standing right behind her because she has a tendency to fall backwards. The family has noticed significant worsening of cognition and short-term memory. She has not been evaluated for dementia or tried any medications for that. She needs some help but is continent and can use the toilet and dress herself with minimum help. She remains on aspirin for stroke prevention and has not had any recurrent strokes or TIAs. She is tolerating Lipitor well without myalgias or arthralgias. Blood pressure seems well controlled and today it is 109/67 in office. Her sugars have also been well controlled. She  is does not walk a whole lot and family physician and benefits with in-home physical and occupational therapy. On Mini-Mental status exam testing today she scored 17/30 with deficits in  orientation, attention and calculation and recall and following 3 step commands. She has not been on medications like Aricept she was hospitalized have in December 2016 with transient slurred speech and facial weakness in the setting of being started on new medication Remeron. I have personally reviewed the hospital records and MRI scan the brain did not show an acute infarct. Patient was felt to have transient encephalopathy secondary to starting the new medication Remeron and her deficits.cleared quickly. UPDATE 04/15/2016 [name], 82 year old female returns for follow-up. Patient has a history of stroke which occurred in August 2016.MRI of the brain showed a large acute right cerebellar ischemic infarction. Her aspirin was recently reduced to 0.81 by her cardiologist. She has not had further stroke or TIA symptoms. When last seen by Dr. Leonie Taylor, she was having more problems with cognition and short-term memory. She was placed on Aricept 5 mg for one month and is now taking 2 tablets a day. EEG was normal,  dementia labs return normal except for elevated homocysteine. She continues to have 24/ 7 care. She walks very little and gets around with a walker. Sugars have been well controlled according to granddaughter who provides her history today. In December 2016 the patient was admitted for transient slurred speech and facial weakness, MRI scan of the brain did not show an acute infarct. It was felt that she had a transient loss secondary to starting a new medication Remeron. She returns for reevaluation. The daughter is requesting some home physical therapy    REVIEW OF SYSTEMS: Full 14 system review of systems performed and notable only for those listed, all others are neg:  Constitutional: neg  Cardiovascular: neg Ear/Nose/Throat: neg  Skin: neg Eyes: neg Respiratory: neg Gastroitestinal: neg  Hematology/Lymphatic: neg  Endocrine: neg Musculoskeletal: Walking difficulty Allergy/Immunology:  neg Neurological: neg Psychiatric: neg Sleep : neg   ALLERGIES: No Known Allergies  HOME MEDICATIONS: Outpatient Medications Prior to Visit  Medication Sig Dispense Refill  . acetaminophen (TYLENOL) 500 MG tablet Take 500 mg by mouth every 6 (six) hours as needed. For pain     . albuterol (PROVENTIL HFA;VENTOLIN HFA) 108 (90 BASE) MCG/ACT inhaler Inhale 2 puffs into the lungs every 6 (six) hours as needed. Shortness of breath    . aspirin 81 MG tablet Take 1 tablet (81 mg total) by mouth daily.    Marland Kitchen atorvastatin (LIPITOR) 80 MG tablet Take 1 tablet (80 mg total) by mouth daily at 6 PM.    . carvedilol (COREG) 6.25 MG tablet Take 1 tablet (6.25 mg total) by mouth 2 (two) times daily with a meal.    . Cholecalciferol (VITAMIN D-3 PO) Take 1,000 mg by mouth daily.     Marland Kitchen donepezil (ARICEPT) 5 MG tablet Take 1 tablet (5 mg total) by mouth at bedtime. Start 1 tablet daily x 4 weeks then two tablets daily 60 tablet 3  . DULoxetine (CYMBALTA) 60 MG capsule Take 1 capsule (60 mg total) by mouth daily. 30 capsule 5  . esomeprazole (NEXIUM) 20 MG capsule Take 20 mg by mouth 2 (two) times daily before a meal.    . lisinopril (PRINIVIL,ZESTRIL) 5 MG tablet Take 1 tablet (5 mg total) by mouth daily. 90 tablet 3  . metFORMIN (GLUCOPHAGE) 500 MG tablet Take 1,000 mg by mouth 2 (two)  times daily. [employer] papers states 1000 mg tabs twice a day.. Pt states pill are 500 mg 2 in am 1 in pm    . diclofenac sodium (VOLTAREN) 1 % GEL Apply 1 application topically 4 (four) times daily as needed. Pain in shoulder     No facility-administered medications prior to visit.     PAST MEDICAL HISTORY: Past Medical History:  Diagnosis Date  . Abdominal hernia   . Anxiety   . Arthritis   . Breast cancer ([place]) 1973   right  . 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 Bronchiectasis 11/12  . Bronchitis       . Chronic back pain greater than 3 months duration   . Depression   . GERD (gastroesophageal  reflux disease)   . Heart murmur   . Hypercholesteremia   . Hypertension   . Left ventricular dysfunction 04/2015   EF 30% by TEE, + AK mid-apical anteroseptal and anterior walls plus apex, neg bubble study, grade III plaque asc Ao, suspect ICM  . Osteoarthritis of left shoulder 01/06/2012  . PMR (polymyalgia rheumatica) ([facility])   . Stress fracture of left femur with nonunion 07/08/2011  . Stroke ([place])   . Type II diabetes mellitus ([place])   . Umbilical hernia [id number]   unrepaired    PAST SURGICAL HISTORY: Past Surgical History:  Procedure Laterality Date  . BREAST BIOPSY  1981   left  . CATARACT EXTRACTION W/ INTRAOCULAR LENS IMPLANT  07/2011   left  . EP IMPLANTABLE DEVICE N/A 05/08/2015   Procedure: Loop Recorder Insertion;  Surgeon: Will Meredith Leeds, MD;  Location: [facility];  Service: Cardiovascular;  Laterality: N/A;  . FEMUR SURGERY  07/07/2011   left rod placed  . MASTECTOMY  1973   right  . SHOULDER ARTHROSCOPY  06/2000   left  . TEE WITHOUT CARDIOVERSION N/A 05/07/2015   Procedure: TRANSESOPHAGEAL ECHOCARDIOGRAM (TEE);  Surgeon: Larey Dresser, MD;  EF 30%, +WMA, no thrombus/PFO, suspect ICM    . TOTAL SHOULDER ARTHROPLASTY  01/06/12   left  . TOTAL SHOULDER ARTHROPLASTY  01/06/2012   Procedure: TOTAL SHOULDER ARTHROPLASTY;  Surgeon: Johnny Bridge, MD;  Location: [facility];  Service: Orthopedics;  Laterality: Left;  . TUBAL LIGATION      FAMILY HISTORY: Family History  Problem Relation Age of Onset  . Heart attack Father   . Stroke Son   . Stroke Maternal Aunt     SOCIAL HISTORY: Social History   Social History  . Marital status: Married    Spouse name: N/A  . Number of children: N/A  . Years of education: N/A   Occupational History  . Not on file.   Social History Main Topics  . Smoking status: Never Smoker  . Smokeless tobacco: Former Systems developer    Types: Snuff    Quit date: 09/22/1948     Comment: "just tried smoking; didn't smoke to amount to  nothing"  . Alcohol use No  . Drug use: No  . Sexual activity: Not Currently   Other Topics Concern  . Not on file   Social History Narrative   Currently lives with her husband who has Parkinson's Disease and Dementia. Their children live nearby. They have home health caregivers who assist them throughout the week as well.     PHYSICAL EXAM  Vitals:   04/15/16 1116  BP: 136/68  Pulse: 60  Weight: 122 lb 12.8 oz (55.7 kg)  Height:  5\' 4"  (1.626 m)   Body mass index is 21.08 kg/m. General: Frail elderly Caucasian lady, seated, in no evident distress Head: head normocephalic and atraumatic.  Neck: supple with no carotid  bruits Cardiovascular: regular rate and rhythm, no murmurs Musculoskeletal: no deformity. Mild kyphosis Skin:  no rash/petichiae Vascular:  Normal pulses all extremities   Neurological examination  Mental Status: Awake and fully alert. Oriented to person. Diminished memory. Attention span, concentration and fund of knowledge diminished. Mood and affect appear depressed. No dysarthria or aphasia. MMSE 25/30 with deficits in orientation,  recall Animal naming 6 only. Clock drawing 1/4.  Cranial Nerves:  Pupils equal, briskly reactive to light. Extraocular movements full without nystagmus. Visual fields full to confrontation. Hearing intact. Facial sensation intact. Face, tongue, palate moves normally and symmetrically.  Motor: Mild right hemiparesis with right upper extremity drift. Weakness of right grip and intrinsic hand muscles. Mild right foot drop. Mild weakness of right hip flexors and ankle dorsiflexors are 2/5 Sensory.: intact to touch ,pinprick .position and vibratory sensation.  Coordination: Mild impaired right finger-to-nose and knee to heel coordination. Normal coordination on the left.  Gait and Station: In wheelchair not ambulated   Reflexes: 1+ and symmetric. Toes downgoing.   DIAGNOSTIC DATA (LABS, IMAGING, TESTING) - I reviewed patient  records, labs, notes, testing and imaging myself where available.  Lab Results  Component Value Date   WBC 12.1 (H) 02/08/2016   HGB 12.7 02/08/2016   HCT 40.5 02/08/2016   MCV 86.7 02/08/2016   PLT 257 02/08/2016      Component Value Date/Time   NA 140 02/08/2016 1002   K 4.2 02/08/2016 1002   CL 102 02/08/2016 1002   CO2 24 02/08/2016 1002   GLUCOSE 118 (H) 02/08/2016 1002   BUN 9 02/08/2016 1002   CREATININE 0.78 02/08/2016 1002   CALCIUM 9.9 02/08/2016 1002   PROT 7.3 02/08/2016 1002   ALBUMIN 3.4 (L) 02/08/2016 1002   AST 29 02/08/2016 1002   ALT 25 02/08/2016 1002   ALKPHOS 64 02/08/2016 1002   BILITOT 0.6 02/08/2016 1002   GFRNONAA >60 02/08/2016 1002   GFRAA >60 02/08/2016 1002    Lab Results  Component Value Date   HGBA1C 6.3 (H) 09/04/2015   Lab Results  Component Value Date   [id number] 01/01/2016   Lab Results  Component Value Date   TSH 3.410 01/01/2016      ASSESSMENT AND PLAN 65 year Caucasian lady with history of diabetes mellitus, hypertension, hyperlipidemia, depression, PMR, chronic back pain, remote right breast cancer, systolic congestive heart failure, previous stroke on August 2016, cardiac loop recorder in place 05/08/15 presenting  In December 2016 with worsening right leg weakness, slurred speech and facial droop in setting of headache likely encephalopathy from Remeron. MRI showed no acute infarct. Patient has had cognitive impairment and  underlying mild dementia.The patient is a current patient of Dr. Leonie Taylor  who is out of the office today . This note is sent to the work in doctor. Reviewed previous record    PLAN: Will set up for home health care physical therapy evaluate and treat right sided weakness daughter will call back with home care agency used in the past Aricept 10 mg 1 tablet daily for memory Continue aspirin 0.81 daily for secondary stroke prevention recently changed by cardiologist Blood pressure control below 130/90  today's reading 136/68 Keep LDL cholesterol goal below 70 Follow-up in 6 months Dennie Bible, [facility], [facility], [facility] [address], [place],  [address] [phone]  I reviewed the above note and documentation by the Nurse Practitioner and agree with the history, physical exam, assessment and plan as outlined above. I was immediately available for face-to-face consultation. Star Age, MD, PhD [facility])

## 2016-04-17 ENCOUNTER — Telehealth: Payer: Self-pay | Admitting: Nurse Practitioner

## 2016-04-17 DIAGNOSIS — I639 Cerebral infarction, unspecified: Secondary | ICD-10-CM

## 2016-04-17 DIAGNOSIS — R29898 Other symptoms and signs involving the musculoskeletal system: Secondary | ICD-10-CM

## 2016-04-17 NOTE — Telephone Encounter (Signed)
Order placed for HHPT to [facility].

## 2016-04-17 NOTE — Addendum Note (Signed)
Addended byOliver Hum on: 04/17/2016 11:54 AM   Modules accepted: Orders

## 2016-04-17 NOTE — Telephone Encounter (Signed)
Pt's daughter called in to give message: they would like to go through [facility].

## 2016-05-02 ENCOUNTER — Ambulatory Visit (INDEPENDENT_AMBULATORY_CARE_PROVIDER_SITE_OTHER): Payer: Medicare Other | Admitting: *Deleted

## 2016-05-02 DIAGNOSIS — I63139 Cerebral infarction due to embolism of unspecified carotid artery: Secondary | ICD-10-CM

## 2016-05-05 NOTE — Progress Notes (Signed)
[facility] Summary Report / Loop Recorder 

## 2016-05-16 ENCOUNTER — Telehealth: Payer: Self-pay

## 2016-05-16 NOTE — Telephone Encounter (Signed)
LFt vm for patients daughter who turn in FMLA forms. Pts daughter is the HCPOA for her mom. PT is currently getting home health therapy for her mom. Rn needs to know will she be attending future appts and testing at [facility]. Rn left contact number to call back on Monday.

## 2016-05-20 NOTE — Telephone Encounter (Signed)
Pt's daughter called back stating yes she will be coming for any appts here. May call daughter at [phone]

## 2016-05-28 NOTE — Telephone Encounter (Signed)
Rn spoke with patients daughter about the form. Rn stated the charge for the form is 50.00 and a release form is needed from her mom. Pts daughter is the HCPOA,for her. When Rn inform Cathy Taylor of the charge she stated" the form is 50.00 that's a lot of money, I work for [employer] and we dont charge that much. Rn explain the practice and board voted on to raise the fee. Rn stated a lot of practices charge for forms such as disability and other forms. PTs daughter stated " I dont know why you charge for FMLA, I can see for the disability forms. Rn advise daughter to contact PCP to see if they can fill it out for her. Pts daughter stated to hold off until she figure out what to do. Pt will let Rn know if she wants to continue with the form.

## 2016-05-28 NOTE — Telephone Encounter (Signed)
Cathy Taylor is the patients HCPOA. The HCPOA forms are scan in the system. Rn fax patients daughter a release form for her mom. Daughter will call back if she wants to continue the form to be filled out.

## 2016-05-31 LAB — CUP PACEART REMOTE DEVICE CHECK: Date Time Interrogation Session: 20170811150947

## 2016-06-02 ENCOUNTER — Ambulatory Visit (INDEPENDENT_AMBULATORY_CARE_PROVIDER_SITE_OTHER): Payer: Medicare Other | Admitting: *Deleted

## 2016-06-02 DIAGNOSIS — I63139 Cerebral infarction due to embolism of unspecified carotid artery: Secondary | ICD-10-CM

## 2016-06-02 NOTE — Progress Notes (Signed)
[facility] Summary Report / Loop Recorder 

## 2016-06-12 ENCOUNTER — Ambulatory Visit: Payer: Medicare Other | Admitting: Interventional Cardiology

## 2016-06-28 LAB — CUP PACEART REMOTE DEVICE CHECK: MDC IDC SESS DTM: 20170910150852

## 2016-06-28 NOTE — Progress Notes (Signed)
Carelink summary report received. Battery status OK. Normal device function. No new symptom episodes, tachy episodes, brady, or pause episodes. No new AF episodes. Monthly summary reports and ROV/PRN 

## 2016-07-01 ENCOUNTER — Ambulatory Visit (INDEPENDENT_AMBULATORY_CARE_PROVIDER_SITE_OTHER): Payer: Medicare Other | Admitting: *Deleted

## 2016-07-01 DIAGNOSIS — I63139 Cerebral infarction due to embolism of unspecified carotid artery: Secondary | ICD-10-CM

## 2016-07-01 NOTE — Progress Notes (Signed)
[facility] Summary Report / Loop Recorder 

## 2016-07-31 ENCOUNTER — Ambulatory Visit (INDEPENDENT_AMBULATORY_CARE_PROVIDER_SITE_OTHER): Payer: Medicare Other | Admitting: *Deleted

## 2016-07-31 DIAGNOSIS — I63139 Cerebral infarction due to embolism of unspecified carotid artery: Secondary | ICD-10-CM

## 2016-07-31 NOTE — Progress Notes (Signed)
[facility] Summary Report / Loop Recorder 

## 2016-09-01 ENCOUNTER — Ambulatory Visit (INDEPENDENT_AMBULATORY_CARE_PROVIDER_SITE_OTHER): Payer: Medicare Other | Admitting: *Deleted

## 2016-09-01 DIAGNOSIS — I63139 Cerebral infarction due to embolism of unspecified carotid artery: Secondary | ICD-10-CM | POA: Diagnosis not present

## 2016-09-02 NOTE — Progress Notes (Signed)
[facility] Summary Report / Loop Recorder 

## 2016-09-17 LAB — CUP PACEART REMOTE DEVICE CHECK
Date Time Interrogation Session: 20171010153724
Implantable Pulse Generator Implant Date: 20160816
MDC IDC PG IMPLANT DT: 20160816
MDC IDC SESS DTM: 20171109161125

## 2016-09-29 ENCOUNTER — Ambulatory Visit (INDEPENDENT_AMBULATORY_CARE_PROVIDER_SITE_OTHER): Payer: Medicare Other | Admitting: *Deleted

## 2016-09-29 DIAGNOSIS — I63139 Cerebral infarction due to embolism of unspecified carotid artery: Secondary | ICD-10-CM

## 2016-09-30 NOTE — Progress Notes (Signed)
[facility] Summary Report / Loop Recorder 

## 2016-10-16 ENCOUNTER — Encounter: Payer: Self-pay | Admitting: Nurse Practitioner

## 2016-10-16 ENCOUNTER — Ambulatory Visit (INDEPENDENT_AMBULATORY_CARE_PROVIDER_SITE_OTHER): Payer: Medicare Other | Admitting: Nurse Practitioner

## 2016-10-16 VITALS — BP 145/78 | HR 64 | Ht 64.0 in | Wt 134.2 lb

## 2016-10-16 DIAGNOSIS — E785 Hyperlipidemia, unspecified: Secondary | ICD-10-CM | POA: Diagnosis not present

## 2016-10-16 DIAGNOSIS — I639 Cerebral infarction, unspecified: Secondary | ICD-10-CM | POA: Diagnosis not present

## 2016-10-16 DIAGNOSIS — I10 Essential (primary) hypertension: Secondary | ICD-10-CM | POA: Diagnosis not present

## 2016-10-16 DIAGNOSIS — R413 Other amnesia: Secondary | ICD-10-CM

## 2016-10-16 NOTE — Progress Notes (Signed)
[facility]  PATIENT: Cathy Taylor DOB: Feb 04, 1933   REASON FOR VISIT: Follow-up for stroke memory loss HISTORY FROM: Patient and care giver HISTORY OF PRESENT ILLNESS:Last visit 10/10/2016PS : [name] is seen today for first office follow-up visit following admission to [facility] in August 2016 with stroke. Cathy Taylor is an 83 y.o. female with a istory of hypertension, hyperlipidemia, diabetes mellitus, breast cancer and polymyalgia rheumatica, who presented with vertigo with nausea she was also noted to have slurred speech and confusion and was sent to the emergency room for evaluation from her Primary care physician's office. No focal weakness was noted. She did not experience diplopia. CT scan of her head showed no acute intracranial abnormality. She was noted to have pinpoint pupils on arrival in the emergency room. Urine drug screen was negativeg. Patient has not been on antiplatelets therapy. She had no history of stroke nor TIA. MRI of the brain showed a large acute right cerebellar ischemic infarction. LSN: 05/03/2015 tPA Given: No: Beyond time window for treatment consideration. She was admitted to the neurological unit where she remained stable. MRI scan of the brain showed an acute large superior cerebellar artery infarct on the right side. There is no hydrocephalus. MRA showed occlusion of the right superior cerebellar artery proximally. CT angiogram showed 75% stenosis of the right ICA with dense atheromatous calcification in less than 50% stenosis of left ICA. Carotid ultrasound showed only however 40-59% ICA stenosis bilaterally with the acoustic shadowing due to calcific plaques which may obscure higher velocities. CT angiogram hence was obtained which confirm 75% right ICA stenosis which was asymptomatic. LDL cholesterol was elevated at 88 mg percent. Transthoracic echo showed ejection fraction of 30-35% but no cardiac source of emboli. Transesophageal  echocardiogram confirmed low cardiac ejection fraction but showed no cardiac source of embolism. Hemoglobin A1c was 6.6. Patient had loop recorder inserted for paroxysmal A. fib and so far negative has not had been found. Patient was transferred to skilled nursing facility for inpatient rehabilitation and made gradual improvement and is currently at home for the last 1 week. She is able to walk with a walker but needs a one-person assist. She has lack of motivation and has not been eating well due to decreased appetite and nausea. Patient is also under a lot of stress, husband is not well. She does take Cymbalta 30 mg daily but has not yet tried a higher dose. The family feels that is not helping. She still has some dysarthria and right hemiparesis and dysarthria seems to get worse in the afternoon when she is tired. Update 4/11/2017PS : She returns for follow-up today after last visit 6 months ago. She is accompanied by her granddaughter and great-granddaughter. Patient continues to live at home but now she has 24-hour care. She's had a few falls when she is not careful. She does use a walker mostly but needs 1 person standing right behind her because she has a tendency to fall backwards. The family has noticed significant worsening of cognition and short-term memory. She has not been evaluated for dementia or tried any medications for that. She needs some help but is continent and can use the toilet and dress herself with minimum help. She remains on aspirin for stroke prevention and has not had any recurrent strokes or TIAs. She is tolerating Lipitor well without myalgias or arthralgias. Blood pressure seems well controlled and today it is 109/67 in office. Her sugars have also been well controlled. She  is does not walk a whole lot and family physician and benefits with in-home physical and occupational therapy. On Mini-Mental status exam testing today she scored 17/30 with deficits in orientation, attention and  calculation and recall and following 3 step commands. She has not been on medications like Aricept she was hospitalized have in December 2016 with transient slurred speech and facial weakness in the setting of being started on new medication Remeron. I have personally reviewed the hospital records and MRI scan the brain did not show an acute infarct. Patient was felt to have transient encephalopathy secondary to starting the new medication Remeron and her deficits.cleared quickly. UPDATE 04/15/2016 CM [name], 83 year old female returns for follow-up. Patient has a history of stroke which occurred in August 2016.MRI of the brain showed a large acute right cerebellar ischemic infarction. Her aspirin was recently reduced to 0.81 by her cardiologist. She has not had further stroke or TIA symptoms. When last seen by Dr. Leonie Man, she was having more problems with cognition and short-term memory. She was placed on Aricept 5 mg for one month and is now taking 2 tablets a day. EEG was normal,  dementia labs return normal except for elevated homocysteine. She continues to have 24/ 7 care. She walks very little and gets around with a walker. Sugars have been well controlled according to granddaughter who provides her history today. In December 2016 the patient was admitted for transient slurred speech and facial weakness, MRI scan of the brain did not show an acute infarct. It was felt that she had a transient loss secondary to starting a new medication Remeron. She returns for reevaluation. The daughter is requesting some home physical therapy UPDATE 01/25/2018CM [name], 83 year old female returns for follow-up with her caregiver. Patient has a history of stroke event in August 2016. She is currently on aspirin for secondary stroke prevention. She has not had further stroke or TIA symptoms she was placed on Aricept by Dr. Leonie Man for short-term memory issues. Dementia labs  returned normal. She walks very little due to  arthritic pain in both knees. She ambulates with a rolling walker. She has 24/ 7 care in the home. Her husband has Parkinson's disease and dementia. She has some physical therapy after her last visit but does not  do the home exercise program. She remains on Lipitor for hyperlipidemia. Blood pressure in the office today 145/78. She returns for reevaluation  REVIEW OF SYSTEMS: Full 14 system review of systems performed and notable only for those listed, all others are neg:  Constitutional: neg  Cardiovascular: neg Ear/Nose/Throat: neg  Skin: neg Eyes: neg Respiratory: neg Gastroitestinal: neg  Hematology/Lymphatic: neg  Endocrine: neg Musculoskeletal: Walking difficulty, joint pain knee pain  Allergy/Immunology: neg Neurological: neg Psychiatric: neg Sleep : neg   ALLERGIES: No Known Allergies  HOME MEDICATIONS: Outpatient Medications Prior to Visit  Medication Sig Dispense Refill  . acetaminophen (TYLENOL) 500 MG tablet Take 500 mg by mouth every 6 (six) hours as needed. For pain     . albuterol (PROVENTIL HFA;VENTOLIN HFA) 108 (90 BASE) MCG/ACT inhaler Inhale 2 puffs into the lungs every 6 (six) hours as needed. Shortness of breath    . aspirin 81 MG tablet Take 1 tablet (81 mg total) by mouth daily.    Marland Kitchen atorvastatin (LIPITOR) 80 MG tablet Take 1 tablet (80 mg total) by mouth daily at 6 PM.    . carvedilol (COREG) 6.25 MG tablet Take 1 tablet (6.25 mg total) by mouth 2 (  two) times daily with a meal.    . Cholecalciferol (VITAMIN D-3 PO) Take 1,000 mg by mouth daily.     . diclofenac sodium (VOLTAREN) 1 % GEL Apply 1 application topically 4 (four) times daily as needed. Pain in shoulder    . donepezil (ARICEPT) 10 MG tablet Take 10 mg by mouth daily.    . DULoxetine (CYMBALTA) 60 MG capsule Take 1 capsule (60 mg total) by mouth daily. 30 capsule 5  . lisinopril (PRINIVIL,ZESTRIL) 5 MG tablet Take 1 tablet (5 mg total) by mouth daily. 90 tablet 3  . metFORMIN (GLUCOPHAGE) 500 MG  tablet Take 1,000 mg by mouth 2 (two) times daily. Universal healthcare papers states 1000 mg tabs twice a day.. Pt states pill are 500 mg 2 in am 1 in pm    . esomeprazole (NEXIUM) 20 MG capsule Take 20 mg by mouth 2 (two) times daily before a meal.     No facility-administered medications prior to visit.     PAST MEDICAL HISTORY: Past Medical History:  Diagnosis Date  . Abdominal hernia   . Anxiety   . Arthritis   . Breast cancer ([place]) 1973   right  . 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 Bronchiectasis 11/12  . Bronchitis       . Chronic back pain greater than 3 months duration   . Depression   . GERD (gastroesophageal reflux disease)   . Heart murmur   . Hypercholesteremia   . Hypertension   . Left ventricular dysfunction 04/2015   EF 30% by TEE, + AK mid-apical anteroseptal and anterior walls plus apex, neg bubble study, grade III plaque asc Ao, suspect ICM  . Osteoarthritis of left shoulder 01/06/2012  . PMR (polymyalgia rheumatica) (HCC)   . Stress fracture of left femur with nonunion 07/08/2011  . Stroke ([place])   . Type II diabetes mellitus ([place])   . Umbilical hernia [id number]   unrepaired    PAST SURGICAL HISTORY: Past Surgical History:  Procedure Laterality Date  . BREAST BIOPSY  1981   left  . CATARACT EXTRACTION W/ INTRAOCULAR LENS IMPLANT  07/2011   left  . EP IMPLANTABLE DEVICE N/A 05/08/2015   Procedure: Loop Recorder Insertion;  Surgeon: Will Meredith Leeds, MD;  Location: [facility];  Service: Cardiovascular;  Laterality: N/A;  . FEMUR SURGERY  07/07/2011   left rod placed  . MASTECTOMY  1973   right  . SHOULDER ARTHROSCOPY  06/2000   left  . TEE WITHOUT CARDIOVERSION N/A 05/07/2015   Procedure: TRANSESOPHAGEAL ECHOCARDIOGRAM (TEE);  Surgeon: Larey Dresser, MD;  EF 30%, +WMA, no thrombus/PFO, suspect ICM    . TOTAL SHOULDER ARTHROPLASTY  01/06/12   left  . TOTAL SHOULDER ARTHROPLASTY  01/06/2012   Procedure: TOTAL SHOULDER ARTHROPLASTY;  Surgeon:  Johnny Bridge, MD;  Location: [facility];  Service: Orthopedics;  Laterality: Left;  . TUBAL LIGATION      FAMILY HISTORY: Family History  Problem Relation Age of Onset  . Heart attack Father   . Stroke Son   . Stroke Maternal Aunt     SOCIAL HISTORY: Social History   Social History  . Marital status: Married    Spouse name: N/A  . Number of children: N/A  . Years of education: N/A   Occupational History  . Not on file.   Social History Main Topics  . Smoking status: Never Smoker  . Smokeless tobacco: Former Systems developer  Types: Snuff    Quit date: 09/22/1948     Comment: "just tried smoking; didn't smoke to amount to nothing"  . Alcohol use No  . Drug use: No  . Sexual activity: Not Currently   Other Topics Concern  . Not on file   Social History Narrative   Currently lives with her husband who has Parkinson's Disease and Dementia. Their children live nearby. They have home health caregivers who assist them throughout the week as well.     PHYSICAL EXAM  Vitals:   10/16/16 1440  BP: (!) 145/78  Pulse: 64  Weight: 134 lb 3.2 oz (60.9 kg)  Height: 5\' 4"  (1.626 m)   Body mass index is 23.04 kg/m. General: Frail elderly Caucasian lady, seated, in no evident distress Head: head normocephalic and atraumatic.  Neck: supple with no carotid  bruits Cardiovascular: regular rate and rhythm, no murmurs Musculoskeletal: no deformity. Mild kyphosis Skin:  no rash/petichiae Vascular:  Normal pulses all extremities   Neurological examination  Mental Status: Awake and fully alert. Oriented to person. Diminished memory. Attention span, concentration and fund of knowledge diminished. Mood and affect appear depressed. No dysarthria or aphasia. MMSE 25/30 with deficits in orientation,  recall .  Cranial Nerves:  Pupils equal, briskly reactive to light. Extraocular movements full without nystagmus. Visual fields full to confrontation. Hearing intact. Facial sensation intact. Face,  tongue, palate moves normally and symmetrically.  Motor: Mild right hemiparesis . Weakness of right grip and intrinsic hand muscles. Mild right foot drop. Mild weakness of right hip flexors and ankle dorsiflexors are 3/5 Sensory.: intact to touch ,pinprick .position and vibratory sensation in the upper and lower extremities.  Coordination: Mild impaired right finger-to-nose and knee to heel coordination. Normal coordination on the left.  Gait and Station:Requires assist to stand , ambulated 30 feet in the hall with rolling walker, gait is steady , she tires easily   Reflexes: 1+ and symmetric. Toes downgoing.   DIAGNOSTIC DATA (LABS, IMAGING, TESTING) - I reviewed patient records, labs, notes, testing and imaging myself where available.  Lab Results  Component Value Date   WBC 12.1 (H) 02/08/2016   HGB 12.7 02/08/2016   HCT 40.5 02/08/2016   MCV 86.7 02/08/2016   PLT 257 02/08/2016      Component Value Date/Time   NA 140 02/08/2016 1002   K 4.2 02/08/2016 1002   CL 102 02/08/2016 1002   CO2 24 02/08/2016 1002   GLUCOSE 118 (H) 02/08/2016 1002   BUN 9 02/08/2016 1002   CREATININE 0.78 02/08/2016 1002   CALCIUM 9.9 02/08/2016 1002   PROT 7.3 02/08/2016 1002   ALBUMIN 3.4 (L) 02/08/2016 1002   AST 29 02/08/2016 1002   ALT 25 02/08/2016 1002   ALKPHOS 64 02/08/2016 1002   BILITOT 0.6 02/08/2016 1002   GFRNONAA >60 02/08/2016 1002   GFRAA >60 02/08/2016 1002     Lab Results  Component Value Date   VITAMINB12 368 01/01/2016   Lab Results  Component Value Date   TSH 3.410 01/01/2016      ASSESSMENT AND PLAN 39 year Caucasian lady with history of diabetes mellitus, hypertension, hyperlipidemia, depression, PMR, chronic back pain,  congestive heart failure, previous stroke on August 2016, cardiac loop recorder in place 05/08/15 presenting  In December 2016 with worsening right leg weakness, slurred speech and facial droop in setting of headache likely encephalopathy from  Remeron. MRI showed no acute infarct. Patient has had cognitive impairment and  underlying mild  dementia.The patient is a current patient of Dr. Leonie Man  who is out of the office today . This note is sent to the work in doctor.   PLAN:  Aricept 10 mg 1 tablet daily for memory to continue  Stimulate memory with cross word puzzles, word search etc.  Continue aspirin 0.81 daily for secondary stroke prevention  Blood pressure control below 130/90 today's reading 145/78, continue blood pressure medications Keep LDL cholesterol goal below 70 Continue Lipitor Use walker at all times for safe ambulation, try to do chair exercises Follow-up in 6 months next with Dr. Leonie Man Greater than 50% of time during this 25 minute visit was spent on counseling,explanation of diagnosis, planning of further management, discussion with patient and caregiver and answering questions Dennie Bible, [facility], [facility], [facility] [address], [place], [place] [address] [phone]

## 2016-10-16 NOTE — Patient Instructions (Signed)
Aricept 10 mg 1 tablet daily for memory Continue aspirin 0.81 daily for secondary stroke prevention  Blood pressure control below 130/90 today's reading 145/78, continue blood pressure medications Keep LDL cholesterol goal below 70 Continue Lipitor Use walker at all times for safe ambulation, try to do chair exercises Follow-up in 6 months

## 2016-10-16 NOTE — Progress Notes (Signed)
I have read the note, and I agree with the clinical assessment and plan.  Cathy Taylor KEITH   

## 2016-10-21 LAB — CUP PACEART REMOTE DEVICE CHECK
Date Time Interrogation Session: 20171209170924
MDC IDC PG IMPLANT DT: 20160816

## 2016-10-21 NOTE — Progress Notes (Signed)
Carelink summary report received. Battery status OK. Normal device function. No new symptom episodes, tachy episodes, brady, or pause episodes. No new AF episodes. Monthly summary reports and ROV/PRN 

## 2016-10-29 ENCOUNTER — Ambulatory Visit (INDEPENDENT_AMBULATORY_CARE_PROVIDER_SITE_OTHER): Payer: Medicare Other | Admitting: *Deleted

## 2016-10-29 DIAGNOSIS — I63139 Cerebral infarction due to embolism of unspecified carotid artery: Secondary | ICD-10-CM

## 2016-10-29 NOTE — Progress Notes (Signed)
[facility] Summary Report / Loop Recorder 

## 2016-11-11 LAB — CUP PACEART REMOTE DEVICE CHECK
Implantable Pulse Generator Implant Date: 20160816
MDC IDC SESS DTM: 20180108184700

## 2016-11-20 ENCOUNTER — Telehealth: Payer: Self-pay | Admitting: Internal Medicine

## 2016-11-20 NOTE — Telephone Encounter (Signed)
New message      Pt has a loop recorder.  She moved to a nursing home yesterday and the family forgot to take the box.  They will take it to her today.  Calling to let you know incase you did not get a reading on her loop recorder.

## 2016-11-21 DIAGNOSIS — I10 Essential (primary) hypertension: Secondary | ICD-10-CM | POA: Diagnosis not present

## 2016-11-21 DIAGNOSIS — E1169 Type 2 diabetes mellitus with other specified complication: Secondary | ICD-10-CM | POA: Diagnosis not present

## 2016-11-21 DIAGNOSIS — M353 Polymyalgia rheumatica: Secondary | ICD-10-CM | POA: Diagnosis not present

## 2016-11-21 DIAGNOSIS — E119 Type 2 diabetes mellitus without complications: Secondary | ICD-10-CM | POA: Diagnosis not present

## 2016-11-21 DIAGNOSIS — M159 Polyosteoarthritis, unspecified: Secondary | ICD-10-CM | POA: Diagnosis not present

## 2016-11-23 LAB — CUP PACEART REMOTE DEVICE CHECK
Date Time Interrogation Session: 20180207173951
Implantable Pulse Generator Implant Date: 20160816

## 2016-11-23 NOTE — Progress Notes (Signed)
Carelink summary report received. Battery status OK. Normal device function. No new symptom episodes, tachy episodes, brady, or pause episodes. No new AF episodes. Monthly summary reports and ROV/PRN 

## 2016-11-24 DIAGNOSIS — E870 Hyperosmolality and hypernatremia: Secondary | ICD-10-CM | POA: Diagnosis not present

## 2016-11-28 ENCOUNTER — Ambulatory Visit (INDEPENDENT_AMBULATORY_CARE_PROVIDER_SITE_OTHER): Payer: Medicare Other | Admitting: *Deleted

## 2016-11-28 DIAGNOSIS — I63139 Cerebral infarction due to embolism of unspecified carotid artery: Secondary | ICD-10-CM

## 2016-11-28 NOTE — Progress Notes (Signed)
[facility] Summary Report / Loop Recorder 

## 2016-12-11 LAB — CUP PACEART REMOTE DEVICE CHECK
Implantable Pulse Generator Implant Date: 20160816
MDC IDC SESS DTM: 20180309174612

## 2016-12-29 ENCOUNTER — Ambulatory Visit (INDEPENDENT_AMBULATORY_CARE_PROVIDER_SITE_OTHER): Payer: Medicare Other | Admitting: *Deleted

## 2016-12-29 DIAGNOSIS — I63139 Cerebral infarction due to embolism of unspecified carotid artery: Secondary | ICD-10-CM

## 2016-12-30 NOTE — Progress Notes (Signed)
[facility] Summary Report / Loop Recorder 

## 2017-01-09 LAB — CUP PACEART REMOTE DEVICE CHECK
Implantable Pulse Generator Implant Date: 20160816
MDC IDC SESS DTM: 20180408181213

## 2017-01-22 DIAGNOSIS — I10 Essential (primary) hypertension: Secondary | ICD-10-CM | POA: Diagnosis not present

## 2017-01-22 DIAGNOSIS — Z6824 Body mass index (BMI) 24.0-24.9, adult: Secondary | ICD-10-CM | POA: Diagnosis not present

## 2017-01-22 DIAGNOSIS — E119 Type 2 diabetes mellitus without complications: Secondary | ICD-10-CM | POA: Diagnosis not present

## 2017-01-22 DIAGNOSIS — M353 Polymyalgia rheumatica: Secondary | ICD-10-CM | POA: Diagnosis not present

## 2017-01-27 ENCOUNTER — Ambulatory Visit (INDEPENDENT_AMBULATORY_CARE_PROVIDER_SITE_OTHER): Payer: Medicare Other | Admitting: *Deleted

## 2017-01-27 DIAGNOSIS — I63139 Cerebral infarction due to embolism of unspecified carotid artery: Secondary | ICD-10-CM

## 2017-01-27 NOTE — Progress Notes (Signed)
[facility] Summary Report / Loop Recorder 

## 2017-02-06 LAB — CUP PACEART REMOTE DEVICE CHECK
Date Time Interrogation Session: 20180508184837
Implantable Pulse Generator Implant Date: 20160816

## 2017-02-26 ENCOUNTER — Ambulatory Visit (INDEPENDENT_AMBULATORY_CARE_PROVIDER_SITE_OTHER): Payer: Medicare Other | Admitting: *Deleted

## 2017-02-26 DIAGNOSIS — M353 Polymyalgia rheumatica: Secondary | ICD-10-CM | POA: Diagnosis not present

## 2017-02-26 DIAGNOSIS — I63139 Cerebral infarction due to embolism of unspecified carotid artery: Secondary | ICD-10-CM | POA: Diagnosis not present

## 2017-02-26 DIAGNOSIS — I10 Essential (primary) hypertension: Secondary | ICD-10-CM | POA: Diagnosis not present

## 2017-02-26 DIAGNOSIS — E119 Type 2 diabetes mellitus without complications: Secondary | ICD-10-CM | POA: Diagnosis not present

## 2017-02-26 DIAGNOSIS — Z6824 Body mass index (BMI) 24.0-24.9, adult: Secondary | ICD-10-CM | POA: Diagnosis not present

## 2017-02-27 NOTE — Progress Notes (Signed)
[facility] Summary Report / Loop Recorder 

## 2017-03-09 LAB — CUP PACEART REMOTE DEVICE CHECK
Date Time Interrogation Session: 20180607184405
MDC IDC PG IMPLANT DT: 20160816

## 2017-03-09 NOTE — Progress Notes (Signed)
Carelink summary report received. Battery status OK. Normal device function. No new symptom episodes, tachy episodes, brady, or pause episodes. No new AF episodes. Monthly summary reports and ROV/PRN 

## 2017-03-24 ENCOUNTER — Encounter: Payer: Self-pay | Admitting: *Deleted

## 2017-03-30 ENCOUNTER — Ambulatory Visit (INDEPENDENT_AMBULATORY_CARE_PROVIDER_SITE_OTHER): Payer: Medicare Other | Admitting: *Deleted

## 2017-03-30 DIAGNOSIS — I63139 Cerebral infarction due to embolism of unspecified carotid artery: Secondary | ICD-10-CM

## 2017-03-31 NOTE — Progress Notes (Signed)
[facility] Summary Report / Loop Recorder 

## 2017-04-06 DIAGNOSIS — N390 Urinary tract infection, site not specified: Secondary | ICD-10-CM | POA: Diagnosis not present

## 2017-04-08 ENCOUNTER — Encounter: Payer: Self-pay | Admitting: Interventional Cardiology

## 2017-04-08 ENCOUNTER — Ambulatory Visit (INDEPENDENT_AMBULATORY_CARE_PROVIDER_SITE_OTHER): Payer: Medicare Other | Admitting: Interventional Cardiology

## 2017-04-08 VITALS — BP 160/76 | HR 54 | Ht 63.5 in | Wt 146.8 lb

## 2017-04-08 DIAGNOSIS — M79604 Pain in right leg: Secondary | ICD-10-CM

## 2017-04-08 DIAGNOSIS — I639 Cerebral infarction, unspecified: Secondary | ICD-10-CM

## 2017-04-08 DIAGNOSIS — I10 Essential (primary) hypertension: Secondary | ICD-10-CM | POA: Diagnosis not present

## 2017-04-08 DIAGNOSIS — Z95818 Presence of other cardiac implants and grafts: Secondary | ICD-10-CM

## 2017-04-08 DIAGNOSIS — I429 Cardiomyopathy, unspecified: Secondary | ICD-10-CM

## 2017-04-08 DIAGNOSIS — M79605 Pain in left leg: Secondary | ICD-10-CM

## 2017-04-08 LAB — CUP PACEART REMOTE DEVICE CHECK
Date Time Interrogation Session: 20180707195458
MDC IDC PG IMPLANT DT: 20160816

## 2017-04-08 NOTE — Progress Notes (Signed)
Cardiology Office Note    Date:  04/08/2017   ID:  Cathy Taylor, DOB 1933/08/11, MRN [id number]  PCP:  Seward Carol, MD  Cardiologist: Sinclair Grooms, MD   No chief complaint on file.   History of Present Illness:  Cathy Taylor is a 83 y.o. female follow up of history of diabetes, hypertension, PMR and breast cancer. She was admitted for a CVA and had a loop recorder implanted(8/16) as well as suspected ischemic cardiomyopathy with an EF of 30-35 percent and peak troponin of 1.46. Subsequent EF 65-70% 08/2015 on medical therapy.  She has doing well. At no cardiac complaints. No arrhythmias. Denies orthopnea and PND. No specific medication side effects.   Past Medical History:  Diagnosis Date  . Abdominal hernia   . Anxiety   . Arthritis   . Breast cancer ([place]) 1973   right  . 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 Bronchiectasis 11/12  . Bronchitis       . Chronic back pain greater than 3 months duration   . Depression   . GERD (gastroesophageal reflux disease)   . Heart murmur   . Hypercholesteremia   . Hypertension   . Left ventricular dysfunction 04/2015   EF 30% by TEE, + AK mid-apical anteroseptal and anterior walls plus apex, neg bubble study, grade III plaque asc Ao, suspect ICM  . Osteoarthritis of left shoulder 01/06/2012  . PMR (polymyalgia rheumatica) (HCC)   . Stress fracture of left femur with nonunion 07/08/2011  . Stroke ([place])   . Type II diabetes mellitus ([place])   . Umbilical hernia 9/47/65   unrepaired    Past Surgical History:  Procedure Laterality Date  . BREAST BIOPSY  1981   left  . CATARACT EXTRACTION W/ INTRAOCULAR LENS IMPLANT  07/2011   left  . EP IMPLANTABLE DEVICE N/A 05/08/2015   Procedure: Loop Recorder Insertion;  Surgeon: Will Meredith Leeds, MD;  Location: [facility];  Service: Cardiovascular;  Laterality: N/A;  . FEMUR SURGERY  07/07/2011   left rod placed  . MASTECTOMY  1973   right  . SHOULDER ARTHROSCOPY  06/2000   left  . TEE WITHOUT CARDIOVERSION N/A 05/07/2015   Procedure: TRANSESOPHAGEAL ECHOCARDIOGRAM (TEE);  Surgeon: Larey Dresser, MD;  EF 30%, +WMA, no thrombus/PFO, suspect ICM    . TOTAL SHOULDER ARTHROPLASTY  01/06/12   left  . TOTAL SHOULDER ARTHROPLASTY  01/06/2012   Procedure: TOTAL SHOULDER ARTHROPLASTY;  Surgeon: Johnny Bridge, MD;  Location: [facility];  Service: Orthopedics;  Laterality: Left;  . TUBAL LIGATION      Current Medications: Outpatient Medications Prior to Visit  Medication Sig Dispense Refill  . aspirin 81 MG tablet Take 1 tablet (81 mg total) by mouth daily.    Marland Kitchen atorvastatin (LIPITOR) 80 MG tablet Take 1 tablet (80 mg total) by mouth daily at 6 PM.    . carvedilol (COREG) 6.25 MG tablet Take 1 tablet (6.25 mg total) by mouth 2 (two) times daily with a meal.    . Cholecalciferol (VITAMIN D-3 PO) Take 1,000 mg by mouth daily.     Marland Kitchen donepezil (ARICEPT) 10 MG tablet Take 10 mg by mouth daily.    . DULoxetine (CYMBALTA) 60 MG capsule Take 1 capsule (60 mg total) by mouth daily. 30 capsule 5  . lisinopril (PRINIVIL,ZESTRIL) 5 MG tablet Take 1 tablet (5 mg total) by mouth daily. 90 tablet 3  . acetaminophen (TYLENOL) 500 MG  tablet Take 500 mg by mouth every 6 (six) hours as needed. For pain     . albuterol (PROVENTIL HFA;VENTOLIN HFA) 108 (90 BASE) MCG/ACT inhaler Inhale 2 puffs into the lungs every 6 (six) hours as needed. Shortness of breath    . diclofenac sodium (VOLTAREN) 1 % GEL Apply 1 application topically 4 (four) times daily as needed. Pain in shoulder    . metFORMIN (GLUCOPHAGE) 500 MG tablet Take 1,000 mg by mouth 2 (two) times daily. Universal healthcare papers states 1000 mg tabs twice a day.. Pt states pill are 500 mg 2 in am 1 in pm     No facility-administered medications prior to visit.      Allergies:   Patient has no known allergies.   Social History   Social History  . Marital status: Married    Spouse name: N/A  . Number of children: N/A  . Years of  education: N/A   Social History Main Topics  . Smoking status: Never Smoker  . Smokeless tobacco: Former Systems developer    Types: Snuff    Quit date: 09/22/1948     Comment: "just tried smoking; didn't smoke to amount to nothing"  . Alcohol use No  . Drug use: No  . Sexual activity: Not Currently   Other Topics Concern  . None   Social History Narrative   Currently lives with her husband who has Parkinson's Disease and Dementia. Their children live nearby. They have home health caregivers who assist them throughout the week as well.     Family History:  The patient's family history includes Heart attack in her father; Stroke in her maternal aunt and son.   ROS:   Please see the history of present illness.    Leg pain at night when she is trying to sleep  All other systems reviewed and are negative.   PHYSICAL EXAM:   VS:  BP (!) 160/76 (BP Location: Left Arm)   Pulse (!) 54   Ht 5' 3.5" (1.613 m)   Wt 146 lb 12.8 oz (66.6 kg)   BMI 25.60 kg/m    GEN: Well nourished, well developed, in no acute distress . Frail appearing. HEENT: normal  Neck: no JVD, carotid bruits, or masses Cardiac: RRR; no murmurs, rubs, or gallops,no edema  Respiratory:  clear to auscultation bilaterally, normal work of breathing GI: soft, nontender, nondistended, + BS MS: no deformity or atrophy  Skin: warm and dry, no rash Neuro:  Alert and Oriented x 3, Strength and sensation are intact Psych: euthymic mood, full affect  Wt Readings from Last 3 Encounters:  04/08/17 146 lb 12.8 oz (66.6 kg)  10/16/16 134 lb 3.2 oz (60.9 kg)  04/15/16 122 lb 12.8 oz (55.7 kg)      Studies/Labs Reviewed:   EKG:  EKG  Sinus bradycardia, left axis deviation, old septal infarct noted by decreased R-wave progression V1 through V3  Recent Labs: No results found for requested labs within last 8760 hours.   Lipid Panel    Component Value Date/Time   CHOL 82 09/04/2015 1335   TRIG 81 09/04/2015 1335   HDL 29 (L)  09/04/2015 1335   CHOLHDL 2.8 09/04/2015 1335   VLDL 16 09/04/2015 1335   LDLCALC 37 09/04/2015 1335    Additional studies/ records that were reviewed today include:  None    ASSESSMENT:    1. Cardiomyopathy, unspecified type ([place])   2. Acute ischemic stroke ([place])   3. Essential hypertension  4. Status post placement of implantable loop recorder   5. Bilateral leg pain      PLAN:  In order of problems listed above:  1. No evidence of volume overload and no complaints compatible with CHF. 2. No new neurological complaints. 3. Well controlled blood pressure. Continue same medical regimen 4. Loop recorder download severe and evaluated and no arrhythmias seen. 5. Bilateral nocturnal leg pain, probably related to neuropathy. Doubt statin effect.    Medication Adjustments/Labs and Tests Ordered: Current medicines are reviewed at length with the patient today.  Concerns regarding medicines are outlined above.  Medication changes, Labs and Tests ordered today are listed in the Patient Instructions below. Patient Instructions  Medication Instructions:  None  Labwork: None  Testing/Procedures: None  Follow-Up: Your physician wants you to follow-up in: 1 year with Dr. Tamala Julian.  You will receive a reminder letter in the mail two months in advance. If you don't receive a letter, please call our office to schedule the follow-up appointment.   Any Other Special Instructions Will Be Listed Below (If Applicable).     If you need a refill on your cardiac medications before your next appointment, please call your pharmacy.      Signed, Sinclair Grooms, MD  04/08/2017 11:50 AM    [address], [place], [place]  [address] Phone: [phone]; Fax: [phone]

## 2017-04-08 NOTE — Patient Instructions (Signed)

## 2017-04-15 DIAGNOSIS — M79671 Pain in right foot: Secondary | ICD-10-CM | POA: Diagnosis not present

## 2017-04-15 DIAGNOSIS — B351 Tinea unguium: Secondary | ICD-10-CM | POA: Diagnosis not present

## 2017-04-15 DIAGNOSIS — M79674 Pain in right toe(s): Secondary | ICD-10-CM | POA: Diagnosis not present

## 2017-04-20 ENCOUNTER — Ambulatory Visit (INDEPENDENT_AMBULATORY_CARE_PROVIDER_SITE_OTHER): Payer: Medicare Other | Admitting: Neurology

## 2017-04-20 ENCOUNTER — Encounter: Payer: Self-pay | Admitting: Neurology

## 2017-04-20 VITALS — BP 148/60 | HR 56 | Ht 63.5 in | Wt 144.0 lb

## 2017-04-20 DIAGNOSIS — M353 Polymyalgia rheumatica: Secondary | ICD-10-CM | POA: Diagnosis not present

## 2017-04-20 DIAGNOSIS — I69351 Hemiplegia and hemiparesis following cerebral infarction affecting right dominant side: Secondary | ICD-10-CM

## 2017-04-20 NOTE — Patient Instructions (Signed)
I had a long discussion with the patient and her daughter regarding her remote stroke, right hemiparesis and mild dementia all of which appear to be stable. Continue aspirin for stroke prevention with strict control of hypertension with blood pressure goal below 130/90, diabetes with hemoglobin A1c goal below 6.5 and lipids with LDL cholesterol goal below 70 mg percent. Continue Aricept for her mild dementia which appears stable. Recommend physical and occupational therapy and fall and safety precautions. She will return for follow-up in 6 months with my nurse practitioner or call earlier if necessary.

## 2017-04-20 NOTE — Progress Notes (Signed)
[facility]  PATIENT: Cathy Taylor DOB: Feb 04, 1933   REASON FOR VISIT: Follow-up for stroke memory loss HISTORY FROM: Patient and care giver HISTORY OF PRESENT ILLNESS:Last visit 10/10/2016PS : Cathy Taylor is seen today for first office follow-up visit following admission to [facility] in August 2016 with stroke. Cathy Taylor is an 83 y.o. female with a istory of hypertension, hyperlipidemia, diabetes mellitus, breast cancer and polymyalgia rheumatica, who presented with vertigo with nausea she was also noted to have slurred speech and confusion and was sent to the emergency room for evaluation from her Primary care physician's office. No focal weakness was noted. She did not experience diplopia. CT scan of her head showed no acute intracranial abnormality. She was noted to have pinpoint pupils on arrival in the emergency room. Urine drug screen was negativeg. Patient has not been on antiplatelets therapy. She had no history of stroke nor TIA. MRI of the brain showed a large acute right cerebellar ischemic infarction. LSN: 05/03/2015 tPA Given: No: Beyond time window for treatment consideration. She was admitted to the neurological unit where she remained stable. MRI scan of the brain showed an acute large superior cerebellar artery infarct on the right side. There is no hydrocephalus. MRA showed occlusion of the right superior cerebellar artery proximally. CT angiogram showed 75% stenosis of the right ICA with dense atheromatous calcification in less than 50% stenosis of left ICA. Carotid ultrasound showed only however 40-59% ICA stenosis bilaterally with the acoustic shadowing due to calcific plaques which may obscure higher velocities. CT angiogram hence was obtained which confirm 75% right ICA stenosis which was asymptomatic. LDL cholesterol was elevated at 88 mg percent. Transthoracic echo showed ejection fraction of 30-35% but no cardiac source of emboli. Transesophageal  echocardiogram confirmed low cardiac ejection fraction but showed no cardiac source of embolism. Hemoglobin A1c was 6.6. Patient had loop recorder inserted for paroxysmal A. fib and so far negative has not had been found. Patient was transferred to skilled nursing facility for inpatient rehabilitation and made gradual improvement and is currently at home for the last 1 week. She is able to walk with a walker but needs a one-person assist. She has lack of motivation and has not been eating well due to decreased appetite and nausea. Patient is also under a lot of stress, husband is not well. She does take Cymbalta 30 mg daily but has not yet tried a higher dose. The family feels that is not helping. She still has some dysarthria and right hemiparesis and dysarthria seems to get worse in the afternoon when she is tired. Update 4/11/2017PS : She returns for follow-up today after last visit 6 months ago. She is accompanied by her granddaughter and great-granddaughter. Patient continues to live at home but now she has 24-hour care. She's had a few falls when she is not careful. She does use a walker mostly but needs 1 person standing right behind her because she has a tendency to fall backwards. The family has noticed significant worsening of cognition and short-term memory. She has not been evaluated for dementia or tried any medications for that. She needs some help but is continent and can use the toilet and dress herself with minimum help. She remains on aspirin for stroke prevention and has not had any recurrent strokes or TIAs. She is tolerating Lipitor well without myalgias or arthralgias. Blood pressure seems well controlled and today it is 109/67 in office. Her sugars have also been well controlled. She  is does not walk a whole lot and family physician and benefits with in-home physical and occupational therapy. On Mini-Mental status exam testing today she scored 17/30 with deficits in orientation, attention and  calculation and recall and following 3 step commands. She has not been on medications like Aricept she was hospitalized have in December 2016 with transient slurred speech and facial weakness in the setting of being started on new medication Remeron. I have personally reviewed the hospital records and MRI scan the brain did not show an acute infarct. Patient was felt to have transient encephalopathy secondary to starting the new medication Remeron and her deficits.cleared quickly. UPDATE 04/15/2016 [name], 83 year old female returns for follow-up. Patient has a history of stroke which occurred in August 2016.MRI of the brain showed a large acute right cerebellar ischemic infarction. Her aspirin was recently reduced to 0.81 by her cardiologist. She has not had further stroke or TIA symptoms. When last seen by Dr. Leonie Man, she was having more problems with cognition and short-term memory. She was placed on Aricept 5 mg for one month and is now taking 2 tablets a day. EEG was normal,  dementia labs return normal except for elevated homocysteine. She continues to have 24/ 7 care. She walks very little and gets around with a walker. Sugars have been well controlled according to granddaughter who provides her history today. In December 2016 the patient was admitted for transient slurred speech and facial weakness, MRI scan of the brain did not show an acute infarct. It was felt that she had a transient loss secondary to starting a new medication Remeron. She returns for reevaluation. The daughter is requesting some home physical therapy UPDATE 01/25/2018[name], 83 year old female returns for follow-up with her caregiver. Patient has a history of stroke event in August 2016. She is currently on aspirin for secondary stroke prevention. She has not had further stroke or TIA symptoms she was placed on Aricept by Dr. Leonie Man for short-term memory issues. Dementia labs  returned normal. She walks very little due to  arthritic pain in both knees. She ambulates with a rolling walker. She has 24/ 7 care in the home. Her husband has Parkinson's disease and dementia. She has some physical therapy after her last visit but does not  do the home exercise program. She remains on Lipitor for hyperlipidemia. Blood pressure in the office today 145/78. She returns for reevaluation Update 04/20/2017 ; She returns for follow-up after last visit 6 months ago. She is accompanied by daughter. Patient has moved into [facility] facility since January. She and her husband who also has Parkinson's and dementia but not able to get 24-hour caregiver help at home. They're now both live in the same facility in different rooms. Patient has functional decline and is no longer walking. She spends most of the time in the chair. She did get some physical therapy early on and was able to walk with help but has not done so for several months. She denies any new or recurrent stroke symptoms. She states her memory is unchanged but daughter does state that she does get confused at times. She remains on Aricept 10 mg daily which is tolerating well without any GI or CNS side effects. Her blood pressure is well controlled though today it is slightly elevated at 148/60. She remains on aspirin which is tolerating well without bruising or bleeding. He also states that the cholesterol and sugars seem to be well controlled though I  do not have any recent lab work to review. REVIEW OF SYSTEMS: Full 14 system review of systems performed and notable only for those listed, all others are neg:   Appetite change, restless leg, incontinence of bladder, frequency of urination, joint pain, aching muscles, easy bruising, speech difficulty, agitation, confusion, depression and all other systems negative  ALLERGIES: No Known Allergies  HOME MEDICATIONS: Outpatient Medications Prior to Visit  Medication Sig Dispense Refill  . acetaminophen (TYLENOL) 325 MG  tablet Take 650 mg by mouth every 6 (six) hours as needed for mild pain.    Marland Kitchen aspirin 81 MG tablet Take 1 tablet (81 mg total) by mouth daily.    Marland Kitchen atorvastatin (LIPITOR) 80 MG tablet Take 1 tablet (80 mg total) by mouth daily at 6 PM.    . carvedilol (COREG) 6.25 MG tablet Take 1 tablet (6.25 mg total) by mouth 2 (two) times daily with a meal.    . Cholecalciferol (VITAMIN D-3 PO) Take 1,000 mg by mouth daily.     Marland Kitchen donepezil (ARICEPT) 10 MG tablet Take 10 mg by mouth daily.    . DULoxetine (CYMBALTA) 60 MG capsule Take 1 capsule (60 mg total) by mouth daily. 30 capsule 5  . folic acid (FOLVITE) 1 MG tablet Take 1 mg by mouth daily.    Marland Kitchen lisinopril (PRINIVIL,ZESTRIL) 5 MG tablet Take 1 tablet (5 mg total) by mouth daily. 90 tablet 3  . metFORMIN (GLUCOPHAGE) 500 MG tablet Take 500 mg by mouth 2 (two) times daily.  10  . mirtazapine (REMERON) 15 MG tablet Take 7.5 mg by mouth at bedtime.  10  . omeprazole (PRILOSEC) 20 MG capsule Take 20 mg by mouth daily.    Marland Kitchen sulfamethoxazole-trimethoprim (BACTRIM,SEPTRA) 400-80 MG tablet Take 1 tablet by mouth 2 (two) times daily. (Start 04-07-17 stop 04-11-17)  0   No facility-administered medications prior to visit.     PAST MEDICAL HISTORY: Past Medical History:  Diagnosis Date  . Abdominal hernia   . Anxiety   . Arthritis   . Breast cancer ([place]) 1973   right  . 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 Bronchiectasis 11/12  . Bronchitis       . Chronic back pain greater than 3 months duration   . Depression   . GERD (gastroesophageal reflux disease)   . Heart murmur   . Hypercholesteremia   . Hypertension   . Left ventricular dysfunction 04/2015   EF 30% by TEE, + AK mid-apical anteroseptal and anterior walls plus apex, neg bubble study, grade III plaque asc Ao, suspect ICM  . Osteoarthritis of left shoulder 01/06/2012  . PMR (polymyalgia rheumatica) ([facility])   . Stress fracture of left femur with nonunion 07/08/2011  . Stroke ([place])   . Type II  diabetes mellitus ([place])   . Umbilical hernia 4/58/09   unrepaired    PAST SURGICAL HISTORY: Past Surgical History:  Procedure Laterality Date  . BREAST BIOPSY  1981   left  . CATARACT EXTRACTION W/ INTRAOCULAR LENS IMPLANT  07/2011   left  . EP IMPLANTABLE DEVICE N/A 05/08/2015   Procedure: Loop Recorder Insertion;  Surgeon: Will Meredith Leeds, MD;  Location: [facility];  Service: Cardiovascular;  Laterality: N/A;  . FEMUR SURGERY  07/07/2011   left rod placed  . MASTECTOMY  1973   right  . SHOULDER ARTHROSCOPY  06/2000   left  . TEE WITHOUT CARDIOVERSION N/A 05/07/2015   Procedure: TRANSESOPHAGEAL ECHOCARDIOGRAM (TEE);  Surgeon: Larey Dresser, MD;  EF 30%, +WMA, no thrombus/PFO, suspect ICM    . TOTAL SHOULDER ARTHROPLASTY  01/06/12   left  . TOTAL SHOULDER ARTHROPLASTY  01/06/2012   Procedure: TOTAL SHOULDER ARTHROPLASTY;  Surgeon: Johnny Bridge, MD;  Location: [facility];  Service: Orthopedics;  Laterality: Left;  . TUBAL LIGATION      FAMILY HISTORY: Family History  Problem Relation Age of Onset  . Heart attack Father   . Stroke Son   . Stroke Maternal Aunt     SOCIAL HISTORY: Social History   Social History  . Marital status: Married    Spouse name: N/A  . Number of children: N/A  . Years of education: N/A   Occupational History  . Not on file.   Social History Main Topics  . Smoking status: Never Smoker  . Smokeless tobacco: Former Systems developer    Types: Snuff    Quit date: 09/22/1948     Comment: "just tried smoking; didn't smoke to amount to nothing"  . Alcohol use No  . Drug use: No  . Sexual activity: Not Currently   Other Topics Concern  . Not on file   Social History Narrative   Currently lives with her husband who has Parkinson's Disease and Dementia. Their children live nearby. They have home health caregivers who assist them throughout the week as well.     PHYSICAL EXAM  Vitals:   04/20/17 1051  BP: (!) 148/60  Pulse: (!) 56  Weight:  144 lb (65.3 kg)  Height: 5' 3.5" (1.613 m)   Body mass index is 25.11 kg/m. General: Frail elderly Caucasian lady, seated, in no evident distress Head: head normocephalic and atraumatic.  Neck: supple with no carotid  bruits Cardiovascular: regular rate and rhythm, no murmurs Musculoskeletal: no deformity. Mild kyphosis Skin:  no rash/petichiae Vascular:  Normal pulses all extremities   Neurological examination  Mental Status: Awake and fully alert. Oriented to person. Diminished memory. Attention span, concentration and fund of knowledge diminished. Mood and affect appear depressed. No dysarthria or aphasia. MMSE not done but diminished recall 2/3. Able to name 8 animals with the forelegs. Unable to draw clock due to hand weakness  Cranial Nerves:  Pupils equal, briskly reactive to light. Extraocular movements full without nystagmus. Visual fields full to confrontation. Hearing intact. Facial sensation intact. Face, tongue, palate moves normally and symmetrically.  Motor: Mild right hemiparesis 4/5 UE more than LE . Weakness of right grip and intrinsic hand muscles. Mild right foot drop. Mild weakness of right hip flexors and ankle dorsiflexors are 3/5 Sensory.: intact to touch ,pinprick .position and vibratory sensation in the upper and lower extremities.  Coordination: Mild impaired right finger-to-nose and knee to heel coordination. Normal coordination on the left.  Gait and Station:: Unable to test. Patient in wheelchair  Reflexes: 1+ and symmetric. Toes downgoing.   DIAGNOSTIC DATA (LABS, IMAGING, TESTING) - I reviewed patient records, labs, notes, testing and imaging myself where available.  Lab Results  Component Value Date   WBC 12.1 (H) 02/08/2016   HGB 12.7 02/08/2016   HCT 40.5 02/08/2016   MCV 86.7 02/08/2016   PLT 257 02/08/2016      Component Value Date/Time   NA 140 02/08/2016 1002   K 4.2 02/08/2016 1002   CL 102 02/08/2016 1002   CO2 24 02/08/2016 1002    GLUCOSE 118 (H) 02/08/2016 1002   BUN 9 02/08/2016 1002   CREATININE 0.78 02/08/2016 1002  CALCIUM 9.9 02/08/2016 1002   PROT 7.3 02/08/2016 1002   ALBUMIN 3.4 (L) 02/08/2016 1002   AST 29 02/08/2016 1002   ALT 25 02/08/2016 1002   ALKPHOS 64 02/08/2016 1002   BILITOT 0.6 02/08/2016 1002   GFRNONAA >60 02/08/2016 1002   GFRAA >60 02/08/2016 1002     Lab Results  Component Value Date   VITAMINB12 368 01/01/2016   Lab Results  Component Value Date   TSH 3.410 01/01/2016      ASSESSMENT AND PLAN 42 year Caucasian lady with history of diabetes mellitus, hypertension, hyperlipidemia, depression, PMR, chronic back pain,  congestive heart failure, previous stroke on August 2016, cardiac loop recorder in place 05/08/15 presenting  In December 2016 with worsening right leg weakness, slurred speech and facial droop in setting of headache likely encephalopathy from Remeron. MRI showed no acute infarct. Patient has had cognitive impairment and  underlying mild dementia.Which appear stable but she is functionally declined and is no longer ambulating PLAN:  I had a long discussion with the patient and her daughter regarding her remote stroke, right hemiparesis and mild dementia all of which appear to be stable. Continue aspirin for stroke prevention with strict control of hypertension with blood pressure goal below 130/90, diabetes with hemoglobin A1c goal below 6.5 and lipids with LDL cholesterol goal below 70 mg percent. Continue Aricept for her mild dementia which appears stable. Recommend physical and occupational therapy and fall and safety precautions. She will return for follow-up in 6 months with my nurse practitioner or call earlier if necessary. Greater than 50% of time during this 25 minute visit was spent on counseling,explanation of diagnosis of dementia, hemiparesis, stroke, planning of further management, discussion with patient and caregiver and answering questions   Antony Contras,  MD [facility] [address] [place],  [address] [phone]

## 2017-04-27 ENCOUNTER — Ambulatory Visit (INDEPENDENT_AMBULATORY_CARE_PROVIDER_SITE_OTHER): Payer: Medicare Other | Admitting: *Deleted

## 2017-04-27 DIAGNOSIS — I63139 Cerebral infarction due to embolism of unspecified carotid artery: Secondary | ICD-10-CM | POA: Diagnosis not present

## 2017-04-28 NOTE — Progress Notes (Signed)
[facility] Summary Report / Loop Recorder 

## 2017-04-30 DIAGNOSIS — N183 Chronic kidney disease, stage 3 (moderate): Secondary | ICD-10-CM | POA: Diagnosis not present

## 2017-04-30 DIAGNOSIS — E1122 Type 2 diabetes mellitus with diabetic chronic kidney disease: Secondary | ICD-10-CM | POA: Diagnosis not present

## 2017-04-30 DIAGNOSIS — E785 Hyperlipidemia, unspecified: Secondary | ICD-10-CM | POA: Diagnosis not present

## 2017-04-30 DIAGNOSIS — E1169 Type 2 diabetes mellitus with other specified complication: Secondary | ICD-10-CM | POA: Diagnosis not present

## 2017-05-06 LAB — CUP PACEART REMOTE DEVICE CHECK
Date Time Interrogation Session: 20180806201106
MDC IDC PG IMPLANT DT: 20160816

## 2017-05-08 ENCOUNTER — Telehealth: Payer: Self-pay | Admitting: Neurology

## 2017-05-08 NOTE — Telephone Encounter (Signed)
Patient's daughter called and wanted me to send referral to . [facility] . Telephone [phone] - fax [phone] .

## 2017-05-27 ENCOUNTER — Ambulatory Visit (INDEPENDENT_AMBULATORY_CARE_PROVIDER_SITE_OTHER): Payer: Medicare Other | Admitting: *Deleted

## 2017-05-27 DIAGNOSIS — I63139 Cerebral infarction due to embolism of unspecified carotid artery: Secondary | ICD-10-CM

## 2017-05-28 NOTE — Progress Notes (Signed)
[facility] Summary Report / Loop Recorder 

## 2017-06-01 LAB — CUP PACEART REMOTE DEVICE CHECK
Date Time Interrogation Session: 20180905213938
MDC IDC PG IMPLANT DT: 20160816

## 2017-06-10 DIAGNOSIS — N183 Chronic kidney disease, stage 3 (moderate): Secondary | ICD-10-CM | POA: Diagnosis not present

## 2017-06-10 DIAGNOSIS — I10 Essential (primary) hypertension: Secondary | ICD-10-CM | POA: Diagnosis not present

## 2017-06-10 DIAGNOSIS — E1122 Type 2 diabetes mellitus with diabetic chronic kidney disease: Secondary | ICD-10-CM | POA: Diagnosis not present

## 2017-06-10 DIAGNOSIS — E1169 Type 2 diabetes mellitus with other specified complication: Secondary | ICD-10-CM | POA: Diagnosis not present

## 2017-06-15 DIAGNOSIS — N390 Urinary tract infection, site not specified: Secondary | ICD-10-CM | POA: Diagnosis not present

## 2017-06-26 ENCOUNTER — Ambulatory Visit (INDEPENDENT_AMBULATORY_CARE_PROVIDER_SITE_OTHER): Payer: Medicare Other | Admitting: *Deleted

## 2017-06-26 DIAGNOSIS — I63139 Cerebral infarction due to embolism of unspecified carotid artery: Secondary | ICD-10-CM

## 2017-06-29 LAB — CUP PACEART REMOTE DEVICE CHECK
Date Time Interrogation Session: 20181005221128
MDC IDC PG IMPLANT DT: 20160816

## 2017-06-29 NOTE — Progress Notes (Signed)
[facility] Summary Report / Loop Recorder 

## 2017-07-22 DIAGNOSIS — B351 Tinea unguium: Secondary | ICD-10-CM | POA: Diagnosis not present

## 2017-07-22 DIAGNOSIS — M79674 Pain in right toe(s): Secondary | ICD-10-CM | POA: Diagnosis not present

## 2017-07-22 DIAGNOSIS — M79671 Pain in right foot: Secondary | ICD-10-CM | POA: Diagnosis not present

## 2017-07-27 ENCOUNTER — Ambulatory Visit (INDEPENDENT_AMBULATORY_CARE_PROVIDER_SITE_OTHER): Payer: Medicare Other | Admitting: *Deleted

## 2017-07-27 DIAGNOSIS — I63139 Cerebral infarction due to embolism of unspecified carotid artery: Secondary | ICD-10-CM

## 2017-07-28 LAB — CUP PACEART REMOTE DEVICE CHECK
Implantable Pulse Generator Implant Date: 20160816
MDC IDC SESS DTM: 20181104231123

## 2017-07-28 NOTE — Progress Notes (Signed)
[facility] Summary Report / Loop Recorder 

## 2017-08-25 ENCOUNTER — Ambulatory Visit (INDEPENDENT_AMBULATORY_CARE_PROVIDER_SITE_OTHER): Payer: Medicare Other | Admitting: *Deleted

## 2017-08-25 DIAGNOSIS — I63139 Cerebral infarction due to embolism of unspecified carotid artery: Secondary | ICD-10-CM | POA: Diagnosis not present

## 2017-08-26 NOTE — Progress Notes (Signed)
[facility] Summary Report / Loop Recorder 

## 2017-09-04 LAB — CUP PACEART REMOTE DEVICE CHECK
MDC IDC PG IMPLANT DT: 20160816
MDC IDC SESS DTM: 20181204233654

## 2017-09-24 ENCOUNTER — Ambulatory Visit (INDEPENDENT_AMBULATORY_CARE_PROVIDER_SITE_OTHER): Payer: Medicare Other | Admitting: *Deleted

## 2017-09-24 DIAGNOSIS — I63139 Cerebral infarction due to embolism of unspecified carotid artery: Secondary | ICD-10-CM | POA: Diagnosis not present

## 2017-09-25 DIAGNOSIS — N183 Chronic kidney disease, stage 3 (moderate): Secondary | ICD-10-CM | POA: Diagnosis not present

## 2017-09-25 DIAGNOSIS — M6281 Muscle weakness (generalized): Secondary | ICD-10-CM | POA: Diagnosis not present

## 2017-09-25 DIAGNOSIS — I429 Cardiomyopathy, unspecified: Secondary | ICD-10-CM | POA: Diagnosis not present

## 2017-09-25 DIAGNOSIS — R278 Other lack of coordination: Secondary | ICD-10-CM | POA: Diagnosis not present

## 2017-09-25 DIAGNOSIS — R413 Other amnesia: Secondary | ICD-10-CM | POA: Diagnosis not present

## 2017-09-25 DIAGNOSIS — M6259 Muscle wasting and atrophy, not elsewhere classified, multiple sites: Secondary | ICD-10-CM | POA: Diagnosis not present

## 2017-09-25 DIAGNOSIS — E1122 Type 2 diabetes mellitus with diabetic chronic kidney disease: Secondary | ICD-10-CM | POA: Diagnosis not present

## 2017-09-25 DIAGNOSIS — I69951 Hemiplegia and hemiparesis following unspecified cerebrovascular disease affecting right dominant side: Secondary | ICD-10-CM | POA: Diagnosis not present

## 2017-09-25 DIAGNOSIS — R2681 Unsteadiness on feet: Secondary | ICD-10-CM | POA: Diagnosis not present

## 2017-09-25 DIAGNOSIS — R2689 Other abnormalities of gait and mobility: Secondary | ICD-10-CM | POA: Diagnosis not present

## 2017-09-25 DIAGNOSIS — M353 Polymyalgia rheumatica: Secondary | ICD-10-CM | POA: Diagnosis not present

## 2017-09-25 NOTE — Progress Notes (Signed)
[facility] Summary Report / Loop Recorder 

## 2017-09-30 DIAGNOSIS — M6259 Muscle wasting and atrophy, not elsewhere classified, multiple sites: Secondary | ICD-10-CM | POA: Diagnosis not present

## 2017-09-30 DIAGNOSIS — I429 Cardiomyopathy, unspecified: Secondary | ICD-10-CM | POA: Diagnosis not present

## 2017-09-30 DIAGNOSIS — M353 Polymyalgia rheumatica: Secondary | ICD-10-CM | POA: Diagnosis not present

## 2017-09-30 DIAGNOSIS — I69951 Hemiplegia and hemiparesis following unspecified cerebrovascular disease affecting right dominant side: Secondary | ICD-10-CM | POA: Diagnosis not present

## 2017-09-30 DIAGNOSIS — E1122 Type 2 diabetes mellitus with diabetic chronic kidney disease: Secondary | ICD-10-CM | POA: Diagnosis not present

## 2017-09-30 DIAGNOSIS — R413 Other amnesia: Secondary | ICD-10-CM | POA: Diagnosis not present

## 2017-10-01 DIAGNOSIS — R413 Other amnesia: Secondary | ICD-10-CM | POA: Diagnosis not present

## 2017-10-01 DIAGNOSIS — E1122 Type 2 diabetes mellitus with diabetic chronic kidney disease: Secondary | ICD-10-CM | POA: Diagnosis not present

## 2017-10-01 DIAGNOSIS — I429 Cardiomyopathy, unspecified: Secondary | ICD-10-CM | POA: Diagnosis not present

## 2017-10-01 DIAGNOSIS — M353 Polymyalgia rheumatica: Secondary | ICD-10-CM | POA: Diagnosis not present

## 2017-10-01 DIAGNOSIS — M6259 Muscle wasting and atrophy, not elsewhere classified, multiple sites: Secondary | ICD-10-CM | POA: Diagnosis not present

## 2017-10-01 DIAGNOSIS — I69951 Hemiplegia and hemiparesis following unspecified cerebrovascular disease affecting right dominant side: Secondary | ICD-10-CM | POA: Diagnosis not present

## 2017-10-02 DIAGNOSIS — N183 Chronic kidney disease, stage 3 (moderate): Secondary | ICD-10-CM | POA: Diagnosis not present

## 2017-10-02 DIAGNOSIS — E1122 Type 2 diabetes mellitus with diabetic chronic kidney disease: Secondary | ICD-10-CM | POA: Diagnosis not present

## 2017-10-02 DIAGNOSIS — R413 Other amnesia: Secondary | ICD-10-CM | POA: Diagnosis not present

## 2017-10-02 DIAGNOSIS — E119 Type 2 diabetes mellitus without complications: Secondary | ICD-10-CM | POA: Diagnosis not present

## 2017-10-02 DIAGNOSIS — I69951 Hemiplegia and hemiparesis following unspecified cerebrovascular disease affecting right dominant side: Secondary | ICD-10-CM | POA: Diagnosis not present

## 2017-10-02 DIAGNOSIS — M6259 Muscle wasting and atrophy, not elsewhere classified, multiple sites: Secondary | ICD-10-CM | POA: Diagnosis not present

## 2017-10-02 DIAGNOSIS — M353 Polymyalgia rheumatica: Secondary | ICD-10-CM | POA: Diagnosis not present

## 2017-10-02 DIAGNOSIS — I429 Cardiomyopathy, unspecified: Secondary | ICD-10-CM | POA: Diagnosis not present

## 2017-10-05 DIAGNOSIS — E1122 Type 2 diabetes mellitus with diabetic chronic kidney disease: Secondary | ICD-10-CM | POA: Diagnosis not present

## 2017-10-05 DIAGNOSIS — R413 Other amnesia: Secondary | ICD-10-CM | POA: Diagnosis not present

## 2017-10-05 DIAGNOSIS — M6259 Muscle wasting and atrophy, not elsewhere classified, multiple sites: Secondary | ICD-10-CM | POA: Diagnosis not present

## 2017-10-05 DIAGNOSIS — I429 Cardiomyopathy, unspecified: Secondary | ICD-10-CM | POA: Diagnosis not present

## 2017-10-05 DIAGNOSIS — I69951 Hemiplegia and hemiparesis following unspecified cerebrovascular disease affecting right dominant side: Secondary | ICD-10-CM | POA: Diagnosis not present

## 2017-10-05 DIAGNOSIS — M353 Polymyalgia rheumatica: Secondary | ICD-10-CM | POA: Diagnosis not present

## 2017-10-06 DIAGNOSIS — R413 Other amnesia: Secondary | ICD-10-CM | POA: Diagnosis not present

## 2017-10-06 DIAGNOSIS — I69951 Hemiplegia and hemiparesis following unspecified cerebrovascular disease affecting right dominant side: Secondary | ICD-10-CM | POA: Diagnosis not present

## 2017-10-06 DIAGNOSIS — I429 Cardiomyopathy, unspecified: Secondary | ICD-10-CM | POA: Diagnosis not present

## 2017-10-06 DIAGNOSIS — M353 Polymyalgia rheumatica: Secondary | ICD-10-CM | POA: Diagnosis not present

## 2017-10-06 DIAGNOSIS — M6259 Muscle wasting and atrophy, not elsewhere classified, multiple sites: Secondary | ICD-10-CM | POA: Diagnosis not present

## 2017-10-06 DIAGNOSIS — E1122 Type 2 diabetes mellitus with diabetic chronic kidney disease: Secondary | ICD-10-CM | POA: Diagnosis not present

## 2017-10-07 LAB — CUP PACEART REMOTE DEVICE CHECK
Date Time Interrogation Session: 20190103233651
MDC IDC PG IMPLANT DT: 20160816

## 2017-10-12 DIAGNOSIS — R413 Other amnesia: Secondary | ICD-10-CM | POA: Diagnosis not present

## 2017-10-12 DIAGNOSIS — M353 Polymyalgia rheumatica: Secondary | ICD-10-CM | POA: Diagnosis not present

## 2017-10-12 DIAGNOSIS — I69951 Hemiplegia and hemiparesis following unspecified cerebrovascular disease affecting right dominant side: Secondary | ICD-10-CM | POA: Diagnosis not present

## 2017-10-12 DIAGNOSIS — M6259 Muscle wasting and atrophy, not elsewhere classified, multiple sites: Secondary | ICD-10-CM | POA: Diagnosis not present

## 2017-10-12 DIAGNOSIS — I429 Cardiomyopathy, unspecified: Secondary | ICD-10-CM | POA: Diagnosis not present

## 2017-10-12 DIAGNOSIS — E1122 Type 2 diabetes mellitus with diabetic chronic kidney disease: Secondary | ICD-10-CM | POA: Diagnosis not present

## 2017-10-14 DIAGNOSIS — R413 Other amnesia: Secondary | ICD-10-CM | POA: Diagnosis not present

## 2017-10-14 DIAGNOSIS — M353 Polymyalgia rheumatica: Secondary | ICD-10-CM | POA: Diagnosis not present

## 2017-10-14 DIAGNOSIS — M6259 Muscle wasting and atrophy, not elsewhere classified, multiple sites: Secondary | ICD-10-CM | POA: Diagnosis not present

## 2017-10-14 DIAGNOSIS — I69951 Hemiplegia and hemiparesis following unspecified cerebrovascular disease affecting right dominant side: Secondary | ICD-10-CM | POA: Diagnosis not present

## 2017-10-14 DIAGNOSIS — I429 Cardiomyopathy, unspecified: Secondary | ICD-10-CM | POA: Diagnosis not present

## 2017-10-14 DIAGNOSIS — E1122 Type 2 diabetes mellitus with diabetic chronic kidney disease: Secondary | ICD-10-CM | POA: Diagnosis not present

## 2017-10-15 DIAGNOSIS — R413 Other amnesia: Secondary | ICD-10-CM | POA: Diagnosis not present

## 2017-10-15 DIAGNOSIS — I69951 Hemiplegia and hemiparesis following unspecified cerebrovascular disease affecting right dominant side: Secondary | ICD-10-CM | POA: Diagnosis not present

## 2017-10-15 DIAGNOSIS — M353 Polymyalgia rheumatica: Secondary | ICD-10-CM | POA: Diagnosis not present

## 2017-10-15 DIAGNOSIS — I429 Cardiomyopathy, unspecified: Secondary | ICD-10-CM | POA: Diagnosis not present

## 2017-10-15 DIAGNOSIS — M6259 Muscle wasting and atrophy, not elsewhere classified, multiple sites: Secondary | ICD-10-CM | POA: Diagnosis not present

## 2017-10-15 DIAGNOSIS — E1122 Type 2 diabetes mellitus with diabetic chronic kidney disease: Secondary | ICD-10-CM | POA: Diagnosis not present

## 2017-10-20 NOTE — Progress Notes (Signed)
[facility]  PATIENT: Cathy Taylor DOB: 1933-05-18   REASON FOR VISIT: Follow-up for stroke memory loss HISTORY FROM: Patient and care giver [place] visit 10/10/2016PS : [name] is seen today for first office follow-up visit following admission to [facility] in August 2016 with stroke. Pansey Pinheiro is an 84 y.o. female with a istory of hypertension, hyperlipidemia, diabetes mellitus, breast cancer and polymyalgia rheumatica, who presented with vertigo with nausea she was also noted to have slurred speech and confusion and was sent to the emergency room for evaluation from her Primary care physician's office. No focal weakness was noted. She did not experience diplopia. CT scan of her head showed no acute intracranial abnormality. She was noted to have pinpoint pupils on arrival in the emergency room. Urine drug screen was negativeg. Patient has not been on antiplatelets therapy. She had no history of stroke nor TIA. MRI of the brain showed a large acute right cerebellar ischemic infarction. LSN: 05/03/2015 tPA Given: No: Beyond time window for treatment consideration. She was admitted to the neurological unit where she remained stable. MRI scan of the brain showed an acute large superior cerebellar artery infarct on the right side. There is no hydrocephalus. MRA showed occlusion of the right superior cerebellar artery proximally. CT angiogram showed 75% stenosis of the right ICA with dense atheromatous calcification in less than 50% stenosis of left ICA. Carotid ultrasound showed only however 40-59% ICA stenosis bilaterally with the acoustic shadowing due to calcific plaques which may obscure higher velocities. CT angiogram hence was obtained which confirm 75% right ICA stenosis which was asymptomatic. LDL cholesterol was elevated at 88 mg percent. Transthoracic echo showed ejection fraction of 30-35% but no cardiac source of emboli.  Transesophageal echocardiogram confirmed low cardiac ejection fraction but showed no cardiac source of embolism. Hemoglobin A1c was 6.6. Patient had loop recorder inserted for paroxysmal A. fib and so far negative has not had been found. Patient was transferred to skilled nursing facility for inpatient rehabilitation and made gradual improvement and is currently at home for the last 1 week. She is able to walk with a walker but needs a one-person assist. She has lack of motivation and has not been eating well due to decreased appetite and nausea. Patient is also under a lot of stress, husband is not well. She does take Cymbalta 30 mg daily but has not yet tried a higher dose. The family feels that is not helping. She still has some dysarthria and right hemiparesis and dysarthria seems to get worse in the afternoon when she is tired. Update 4/11/2017PS : She returns for follow-up today after last visit 6 months ago. She is accompanied by her granddaughter and great-granddaughter. Patient continues to live at home but now she has 24-hour care. She's had a few falls when she is not careful. She does use a walker mostly but needs 1 person standing right behind her because she has a tendency to fall backwards. The family has noticed significant worsening of cognition and short-term memory. She has not been evaluated for dementia or tried any medications for that. She needs some help but is continent and can use the toilet and dress herself with minimum help. She remains on aspirin for stroke prevention and has not had any recurrent strokes or TIAs. She is tolerating Lipitor well without myalgias or arthralgias. Blood pressure seems well controlled and today it is 109/67 in office. Her sugars have also been well controlled.  She is does not walk a whole lot and family physician and benefits with in-home physical and occupational therapy. On Mini-Mental status exam testing today she scored 17/30 with deficits in  orientation, attention and calculation and recall and following 3 step commands. She has not been on medications like Aricept she was hospitalized have in December 2016 with transient slurred speech and facial weakness in the setting of being started on new medication Remeron. I have personally reviewed the hospital records and MRI scan the brain did not show an acute infarct. Patient was felt to have transient encephalopathy secondary to starting the new medication Remeron and her deficits.cleared quickly. UPDATE 04/15/2016 CM [name], 84 year old female returns for follow-up. Patient has a history of stroke which occurred in August 2016.MRI of the brain showed a large acute right cerebellar ischemic infarction. Her aspirin was recently reduced to 0.81 by her cardiologist. She has not had further stroke or TIA symptoms. When last seen by Dr. Leonie Man, she was having more problems with cognition and short-term memory. She was placed on Aricept 5 mg for one month and is now taking 2 tablets a day. EEG was normal,  dementia labs return normal except for elevated homocysteine. She continues to have 24/ 7 care. She walks very little and gets around with a walker. Sugars have been well controlled according to granddaughter who provides her history today. In December 2016 the patient was admitted for transient slurred speech and facial weakness, MRI scan of the brain did not show an acute infarct. It was felt that she had a transient loss secondary to starting a new medication Remeron. She returns for reevaluation. The daughter is requesting some home physical therapy UPDATE 01/25/2018CM [name], 84 year old female returns for follow-up with her caregiver. Patient has a history of stroke event in August 2016. She is currently on aspirin for secondary stroke prevention. She has not had further stroke or TIA symptoms she was placed on Aricept by Dr. Leonie Man for short-term memory issues. Dementia labs  returned normal. She  walks very little due to arthritic pain in both knees. She ambulates with a rolling walker. She has 24/ 7 care in the home. Her husband has Parkinson's disease and dementia. She has some physical therapy after her last visit but does not  do the home exercise program. She remains on Lipitor for hyperlipidemia. Blood pressure in the office today 145/78. She returns for reevaluation Update 7/30/2018PS; She returns for follow-up after last visit 6 months ago. She is accompanied by daughter. Patient has moved into [facility] skilled [facility] facility since January. She and her husband who also has Parkinson's and dementia but not able to get 24-hour caregiver help at home. They're now both live in the same facility in different rooms. Patient has functional decline and is no longer walking. She spends most of the time in the chair. She did get some physical therapy early on and was able to walk with help but has not done so for several months. She denies any new or recurrent stroke symptoms. She states her memory is unchanged but daughter does state that she does get confused at times. She remains on Aricept 10 mg daily which is tolerating well without any GI or CNS side effects. Her blood pressure is well controlled though today it is slightly elevated at 148/60. She remains on aspirin which is tolerating well without bruising or bleeding. He also states that the cholesterol and sugars seem to be well controlled though I  do not have any recent lab work to review. UPDATE 1/30/2019CM Ms. Trine, 84 year old female returns for follow-up with history of stroke in August 2016.  She remains on aspirin for secondary stroke prevention without further stroke or TIA symptoms.  She has minimal bruising and no bleeding.  She is also on Lipitor, no myalgias.  She has moved to [facility] skilled facility [facility].  Her husband resides there as well.  She is seated in wheelchair in no longer walks except  with assistance.  She requires assistance with activities of daily living but she can feed herself.  She states her memory is unchanged.  She remains on Aricept 10 mg without GI side effects.  Blood pressure in the office today well controlled at 130/62.  MMSE is stable at 22 out of 30.  She misses 2-3 recall.  She returns for reevaluation REVIEW OF SYSTEMS: Full 14 system review of systems performed and notable only for those listed, all others are neg:  Constitutional: neg  Cardiovascular: neg Ear/Nose/Throat: neg  Skin: neg Eyes: neg Respiratory: neg Gastroitestinal: neg  Hematology/Lymphatic: neg  Endocrine: neg Musculoskeletal: Walking difficulty, joint pain knee pain  Allergy/Immunology: neg Neurological: Memory loss Psychiatric: neg Sleep : neg   ALLERGIES: No Known Allergies  HOME MEDICATIONS: Outpatient Medications Prior to Visit  Medication Sig Dispense Refill  . acetaminophen (TYLENOL) 325 MG tablet Take 650 mg by mouth every 6 (six) hours as needed for mild pain.    Marland Kitchen aspirin 81 MG tablet Take 1 tablet (81 mg total) by mouth daily.    . carvedilol (COREG) 6.25 MG tablet Take 1 tablet (6.25 mg total) by mouth 2 (two) times daily with a meal.    . Cholecalciferol (VITAMIN D-3 PO) Take 1,000 mg by mouth daily.     Marland Kitchen donepezil (ARICEPT) 10 MG tablet Take 10 mg by mouth daily.    . folic acid (FOLVITE) 1 MG tablet Take 1 mg by mouth daily.    Marland Kitchen gabapentin (NEURONTIN) 100 MG capsule 100 mg 2 (two) times daily.    Marland Kitchen lisinopril (PRINIVIL,ZESTRIL) 5 MG tablet Take 1 tablet (5 mg total) by mouth daily. 90 tablet 3  . metFORMIN (GLUCOPHAGE) 500 MG tablet Take 500 mg by mouth 2 (two) times daily.  10  . omeprazole (PRILOSEC) 40 MG capsule   10  . sertraline (ZOLOFT) 50 MG tablet 50 mg daily.    Marland Kitchen atorvastatin (LIPITOR) 80 MG tablet Take 1 tablet (80 mg total) by mouth daily at 6 PM. (Patient not taking: Reported on 10/21/2017)    . DULoxetine (CYMBALTA) 60 MG capsule Take 1 capsule  (60 mg total) by mouth daily. (Patient not taking: Reported on 10/21/2017) 30 capsule 5  . mirtazapine (REMERON) 15 MG tablet Take 7.5 mg by mouth at bedtime.  10  . omeprazole (PRILOSEC) 20 MG capsule Take 20 mg by mouth daily.     No facility-administered medications prior to visit.     PAST MEDICAL HISTORY: Past Medical History:  Diagnosis Date  . Abdominal hernia   . Anxiety   . Arthritis   . Breast cancer ([place]) 1973   right  . 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 Bronchiectasis 11/12  . Bronchitis       . Chronic back pain greater than 3 months duration   . Depression   . GERD (gastroesophageal reflux disease)   . Heart murmur   . Hypercholesteremia   . Hypertension   .  Left ventricular dysfunction 04/2015   EF 30% by TEE, + AK mid-apical anteroseptal and anterior walls plus apex, neg bubble study, grade III plaque asc Ao, suspect ICM  . Osteoarthritis of left shoulder 01/06/2012  . PMR (polymyalgia rheumatica) ([facility])   . Stress fracture of left femur with nonunion 07/08/2011  . Stroke ([place])   . Type II diabetes mellitus ([place])   . Umbilical hernia 0/10/93   unrepaired    PAST SURGICAL HISTORY: Past Surgical History:  Procedure Laterality Date  . BREAST BIOPSY  1981   left  . CATARACT EXTRACTION W/ INTRAOCULAR LENS IMPLANT  07/2011   left  . EP IMPLANTABLE DEVICE N/A 05/08/2015   Procedure: Loop Recorder Insertion;  Surgeon: Will Meredith Leeds, MD;  Location: [facility];  Service: Cardiovascular;  Laterality: N/A;  . FEMUR SURGERY  07/07/2011   left rod placed  . MASTECTOMY  1973   right  . SHOULDER ARTHROSCOPY  06/2000   left  . TEE WITHOUT CARDIOVERSION N/A 05/07/2015   Procedure: TRANSESOPHAGEAL ECHOCARDIOGRAM (TEE);  Surgeon: Larey Dresser, MD;  EF 30%, +WMA, no thrombus/PFO, suspect ICM    . TOTAL SHOULDER ARTHROPLASTY  01/06/12   left  . TOTAL SHOULDER ARTHROPLASTY  01/06/2012   Procedure: TOTAL SHOULDER ARTHROPLASTY;  Surgeon: Johnny Bridge, MD;   Location: [facility];  Service: Orthopedics;  Laterality: Left;  . TUBAL LIGATION      FAMILY HISTORY: Family History  Problem Relation Age of Onset  . Heart attack Father   . Pulmonary fibrosis Sister   . Stroke Son   . Stroke Maternal Aunt     SOCIAL HISTORY: Social History   Socioeconomic History  . Marital status: Married    Spouse name: Not on file  . Number of children: Not on file  . Years of education: Not on file  . Highest education level: Not on file  Social Needs  . Financial resource strain: Not on file  . Food insecurity - worry: Not on file  . Food insecurity - inability: Not on file  . Transportation needs - medical: Not on file  . Transportation needs - non-medical: Not on file  Occupational History  . Not on file  Tobacco Use  . Smoking status: Never Smoker  . Smokeless tobacco: Former Systems developer    Types: Snuff  . Tobacco comment: "just tried smoking; didn't smoke to amount to nothing"  Substance and Sexual Activity  . Alcohol use: No  . Drug use: No  . Sexual activity: Not Currently  Other Topics Concern  . Not on file  Social History Narrative   Currently lives with her husband who has Parkinson's Disease and Dementia. Their children live nearby. They have home health caregivers who assist them throughout the week as well.     PHYSICAL EXAM  Vitals:   10/21/17 1058  BP: 130/62  Pulse: 60  Weight: 152 lb (68.9 kg)   Body mass index is 26.5 kg/m. General: Frail elderly Caucasian lady, seated, in no evident distress Head: head normocephalic and atraumatic.  Neck: supple with no carotid  bruits Cardiovascular: regular rate and rhythm, no murmurs Musculoskeletal: no deformity. Mild kyphosis Skin:  no rash/petichiae Vascular:  Normal pulses all extremities   Neurological examination  Mental Status: Awake and fully alert. Oriented to person. Diminished memory. AFT 12 MMSE - Mini Mental State Exam 10/21/2017 04/15/2016  Orientation to time 4 4    Orientation to Place 5 4  Registration 3 3  Attention/ Calculation 2 5  Recall 1 0  Language- name 2 objects 2 2  Language- repeat 0 1  Language- follow 3 step command 3 3  Language- read & follow direction 1 1  Write a sentence 1 1  Write a sentence-comments right hand affected by CVA, read sentence she wrote, was good sentence -  Copy design 0 1  Total score 22 25  Follows all commands Cranial Nerves:  Pupils equal, briskly reactive to light. Extraocular movements full without nystagmus. Visual fields full to confrontation. Hearing intact. Facial sensation intact. Face, tongue, palate moves normally and symmetrically.  Motor: Mild right hemiparesis . Weakness of right grip and intrinsic hand muscles. Mild right foot drop. Mild weakness of right hip flexors and ankle dorsiflexors are 3/5 Sensory.: intact to touch ,pinprick .position and vibratory sensation in the upper and lower extremities.  Coordination: Mild impaired right finger-to-nose  coordination. Normal coordination on the left.  Gait and Station: In wheelchair not ambulated due to safety concerns  Reflexes: 1+ and symmetric. Toes downgoing.   DIAGNOSTIC DATA (LABS, IMAGING, TESTING) - I reviewed patient records, labs, notes, testing and imaging myself where available.  Lab Results  Component Value Date   WBC 12.1 (H) 02/08/2016   HGB 12.7 02/08/2016   HCT 40.5 02/08/2016   MCV 86.7 02/08/2016   PLT 257 02/08/2016      Component Value Date/Time   NA 140 02/08/2016 1002   K 4.2 02/08/2016 1002   CL 102 02/08/2016 1002   CO2 24 02/08/2016 1002   GLUCOSE 118 (H) 02/08/2016 1002   BUN 9 02/08/2016 1002   CREATININE 0.78 02/08/2016 1002   CALCIUM 9.9 02/08/2016 1002   PROT 7.3 02/08/2016 1002   ALBUMIN 3.4 (L) 02/08/2016 1002   AST 29 02/08/2016 1002   ALT 25 02/08/2016 1002   ALKPHOS 64 02/08/2016 1002   BILITOT 0.6 02/08/2016 1002   GFRNONAA >60 02/08/2016 1002   GFRAA >60 02/08/2016 1002     Lab  Results  Component Value Date   MIWOEHOZ22 482 01/01/2016   Lab Results  Component Value Date   TSH 3.410 01/01/2016      ASSESSMENT AND PLAN 47 year Caucasian lady with history of diabetes mellitus, hypertension, hyperlipidemia, depression, PMR, chronic back pain,  congestive heart failure, previous stroke on August 2016, cardiac loop recorder in place 05/08/15 presenting  In December 2016 with worsening right leg weakness, slurred speech and facial droop in setting of headache likely encephalopathy from Remeron. MRI showed no acute infarct. Patient has had cognitive impairment and  underlying mild dementia.She is functionally declined and is no longer ambulating except with assistance.    PLAN: Aricept 10 mg 1 tablet daily for memory to continue  Stimulate memory with cross word puzzles, word search etc.  Continue aspirin 0.81 daily for secondary stroke prevention  Blood pressure control below 130/90 today's reading 130/62 continue blood pressure medications Keep LDL cholesterol goal below 70 Continue Lipitor Use walker at all times for safe ambulation, if getting up with assistance only at risk for falls. Follow-up in 6-8 months Greater than 50% of time during this 25 minute visit was spent on counseling,explanation of diagnosis, planning of further management, discussion with patient and caregiver and answering questions Dennie Bible, [facility], [facility], APRN  [facility] [address] [place], [place] [address] [phone]

## 2017-10-21 ENCOUNTER — Ambulatory Visit (INDEPENDENT_AMBULATORY_CARE_PROVIDER_SITE_OTHER): Payer: Medicare Other | Admitting: Nurse Practitioner

## 2017-10-21 ENCOUNTER — Encounter: Payer: Self-pay | Admitting: Nurse Practitioner

## 2017-10-21 VITALS — BP 130/62 | HR 60 | Wt 152.0 lb

## 2017-10-21 DIAGNOSIS — R29898 Other symptoms and signs involving the musculoskeletal system: Secondary | ICD-10-CM | POA: Diagnosis not present

## 2017-10-21 DIAGNOSIS — I63139 Cerebral infarction due to embolism of unspecified carotid artery: Secondary | ICD-10-CM | POA: Diagnosis not present

## 2017-10-21 DIAGNOSIS — B351 Tinea unguium: Secondary | ICD-10-CM | POA: Diagnosis not present

## 2017-10-21 DIAGNOSIS — I10 Essential (primary) hypertension: Secondary | ICD-10-CM

## 2017-10-21 DIAGNOSIS — Z8673 Personal history of transient ischemic attack (TIA), and cerebral infarction without residual deficits: Secondary | ICD-10-CM

## 2017-10-21 DIAGNOSIS — R413 Other amnesia: Secondary | ICD-10-CM

## 2017-10-21 DIAGNOSIS — E785 Hyperlipidemia, unspecified: Secondary | ICD-10-CM

## 2017-10-21 DIAGNOSIS — M79675 Pain in left toe(s): Secondary | ICD-10-CM | POA: Diagnosis not present

## 2017-10-21 DIAGNOSIS — M79674 Pain in right toe(s): Secondary | ICD-10-CM | POA: Diagnosis not present

## 2017-10-21 NOTE — Patient Instructions (Signed)
Per Skilled nursing sheet

## 2017-10-21 NOTE — Progress Notes (Signed)
I agree with the above plan 

## 2017-10-26 ENCOUNTER — Ambulatory Visit (INDEPENDENT_AMBULATORY_CARE_PROVIDER_SITE_OTHER): Payer: Medicare Other | Admitting: *Deleted

## 2017-10-26 DIAGNOSIS — I63139 Cerebral infarction due to embolism of unspecified carotid artery: Secondary | ICD-10-CM

## 2017-10-26 NOTE — Progress Notes (Signed)
[facility] Summary Reprot / Loop Recorder 

## 2017-10-29 DIAGNOSIS — D044 Carcinoma in situ of skin of scalp and neck: Secondary | ICD-10-CM | POA: Diagnosis not present

## 2017-11-03 DIAGNOSIS — M1712 Unilateral primary osteoarthritis, left knee: Secondary | ICD-10-CM | POA: Diagnosis not present

## 2017-11-03 DIAGNOSIS — M19011 Primary osteoarthritis, right shoulder: Secondary | ICD-10-CM | POA: Diagnosis not present

## 2017-11-06 DIAGNOSIS — N183 Chronic kidney disease, stage 3 (moderate): Secondary | ICD-10-CM | POA: Diagnosis not present

## 2017-11-06 DIAGNOSIS — E1169 Type 2 diabetes mellitus with other specified complication: Secondary | ICD-10-CM | POA: Diagnosis not present

## 2017-11-06 DIAGNOSIS — M353 Polymyalgia rheumatica: Secondary | ICD-10-CM | POA: Diagnosis not present

## 2017-11-06 DIAGNOSIS — E1122 Type 2 diabetes mellitus with diabetic chronic kidney disease: Secondary | ICD-10-CM | POA: Diagnosis not present

## 2017-11-12 DIAGNOSIS — B9789 Other viral agents as the cause of diseases classified elsewhere: Secondary | ICD-10-CM | POA: Diagnosis not present

## 2017-11-13 DIAGNOSIS — R062 Wheezing: Secondary | ICD-10-CM | POA: Diagnosis not present

## 2017-11-13 DIAGNOSIS — J069 Acute upper respiratory infection, unspecified: Secondary | ICD-10-CM | POA: Diagnosis not present

## 2017-11-17 LAB — CUP PACEART REMOTE DEVICE CHECK
MDC IDC PG IMPLANT DT: 20160816
MDC IDC SESS DTM: 20190203003806

## 2017-11-26 ENCOUNTER — Ambulatory Visit (INDEPENDENT_AMBULATORY_CARE_PROVIDER_SITE_OTHER): Payer: Medicare Other | Admitting: *Deleted

## 2017-11-26 DIAGNOSIS — I63139 Cerebral infarction due to embolism of unspecified carotid artery: Secondary | ICD-10-CM | POA: Diagnosis not present

## 2017-11-27 NOTE — Progress Notes (Signed)
[facility] Summary Report / Loop Recorder 

## 2017-12-29 ENCOUNTER — Ambulatory Visit (INDEPENDENT_AMBULATORY_CARE_PROVIDER_SITE_OTHER): Payer: Medicare Other | Admitting: *Deleted

## 2017-12-29 DIAGNOSIS — I63139 Cerebral infarction due to embolism of unspecified carotid artery: Secondary | ICD-10-CM | POA: Diagnosis not present

## 2017-12-30 NOTE — Progress Notes (Signed)
[facility] Summary Report / Loop Recorder 

## 2018-01-01 DIAGNOSIS — M159 Polyosteoarthritis, unspecified: Secondary | ICD-10-CM | POA: Diagnosis not present

## 2018-01-01 DIAGNOSIS — E1122 Type 2 diabetes mellitus with diabetic chronic kidney disease: Secondary | ICD-10-CM | POA: Diagnosis not present

## 2018-01-01 DIAGNOSIS — E1169 Type 2 diabetes mellitus with other specified complication: Secondary | ICD-10-CM | POA: Diagnosis not present

## 2018-01-01 DIAGNOSIS — N183 Chronic kidney disease, stage 3 (moderate): Secondary | ICD-10-CM | POA: Diagnosis not present

## 2018-01-06 DIAGNOSIS — M79674 Pain in right toe(s): Secondary | ICD-10-CM | POA: Diagnosis not present

## 2018-01-06 DIAGNOSIS — B351 Tinea unguium: Secondary | ICD-10-CM | POA: Diagnosis not present

## 2018-01-06 DIAGNOSIS — M79675 Pain in left toe(s): Secondary | ICD-10-CM | POA: Diagnosis not present

## 2018-01-07 LAB — CUP PACEART REMOTE DEVICE CHECK
Implantable Pulse Generator Implant Date: 20160816
MDC IDC SESS DTM: 20190308013710

## 2018-02-01 ENCOUNTER — Ambulatory Visit (INDEPENDENT_AMBULATORY_CARE_PROVIDER_SITE_OTHER): Payer: Medicare Other | Admitting: *Deleted

## 2018-02-01 DIAGNOSIS — Z961 Presence of intraocular lens: Secondary | ICD-10-CM | POA: Diagnosis not present

## 2018-02-01 DIAGNOSIS — I63139 Cerebral infarction due to embolism of unspecified carotid artery: Secondary | ICD-10-CM

## 2018-02-01 DIAGNOSIS — Z7984 Long term (current) use of oral hypoglycemic drugs: Secondary | ICD-10-CM | POA: Diagnosis not present

## 2018-02-01 DIAGNOSIS — E119 Type 2 diabetes mellitus without complications: Secondary | ICD-10-CM | POA: Diagnosis not present

## 2018-02-01 LAB — CUP PACEART REMOTE DEVICE CHECK
Date Time Interrogation Session: 20190410014040
MDC IDC PG IMPLANT DT: 20160816

## 2018-02-02 NOTE — Progress Notes (Signed)
[facility] Summary Report / Loop Recorder 

## 2018-02-24 LAB — CUP PACEART REMOTE DEVICE CHECK
Date Time Interrogation Session: 20190513021038
Implantable Pulse Generator Implant Date: 20160816

## 2018-02-26 DIAGNOSIS — M25511 Pain in right shoulder: Secondary | ICD-10-CM | POA: Diagnosis not present

## 2018-02-26 DIAGNOSIS — R2681 Unsteadiness on feet: Secondary | ICD-10-CM | POA: Diagnosis not present

## 2018-02-26 DIAGNOSIS — R278 Other lack of coordination: Secondary | ICD-10-CM | POA: Diagnosis not present

## 2018-02-26 DIAGNOSIS — M353 Polymyalgia rheumatica: Secondary | ICD-10-CM | POA: Diagnosis not present

## 2018-02-26 DIAGNOSIS — M6281 Muscle weakness (generalized): Secondary | ICD-10-CM | POA: Diagnosis not present

## 2018-02-26 DIAGNOSIS — M25562 Pain in left knee: Secondary | ICD-10-CM | POA: Diagnosis not present

## 2018-02-26 DIAGNOSIS — I429 Cardiomyopathy, unspecified: Secondary | ICD-10-CM | POA: Diagnosis not present

## 2018-02-26 DIAGNOSIS — E1169 Type 2 diabetes mellitus with other specified complication: Secondary | ICD-10-CM | POA: Diagnosis not present

## 2018-02-26 DIAGNOSIS — R413 Other amnesia: Secondary | ICD-10-CM | POA: Diagnosis not present

## 2018-02-26 DIAGNOSIS — E785 Hyperlipidemia, unspecified: Secondary | ICD-10-CM | POA: Diagnosis not present

## 2018-02-26 DIAGNOSIS — R2689 Other abnormalities of gait and mobility: Secondary | ICD-10-CM | POA: Diagnosis not present

## 2018-02-26 DIAGNOSIS — N182 Chronic kidney disease, stage 2 (mild): Secondary | ICD-10-CM | POA: Diagnosis not present

## 2018-02-26 DIAGNOSIS — I69951 Hemiplegia and hemiparesis following unspecified cerebrovascular disease affecting right dominant side: Secondary | ICD-10-CM | POA: Diagnosis not present

## 2018-02-26 DIAGNOSIS — K219 Gastro-esophageal reflux disease without esophagitis: Secondary | ICD-10-CM | POA: Diagnosis not present

## 2018-02-26 DIAGNOSIS — R293 Abnormal posture: Secondary | ICD-10-CM | POA: Diagnosis not present

## 2018-02-26 DIAGNOSIS — M6259 Muscle wasting and atrophy, not elsewhere classified, multiple sites: Secondary | ICD-10-CM | POA: Diagnosis not present

## 2018-02-26 DIAGNOSIS — E1122 Type 2 diabetes mellitus with diabetic chronic kidney disease: Secondary | ICD-10-CM | POA: Diagnosis not present

## 2018-03-01 DIAGNOSIS — M353 Polymyalgia rheumatica: Secondary | ICD-10-CM | POA: Diagnosis not present

## 2018-03-01 DIAGNOSIS — I429 Cardiomyopathy, unspecified: Secondary | ICD-10-CM | POA: Diagnosis not present

## 2018-03-01 DIAGNOSIS — M6259 Muscle wasting and atrophy, not elsewhere classified, multiple sites: Secondary | ICD-10-CM | POA: Diagnosis not present

## 2018-03-01 DIAGNOSIS — R413 Other amnesia: Secondary | ICD-10-CM | POA: Diagnosis not present

## 2018-03-01 DIAGNOSIS — I69951 Hemiplegia and hemiparesis following unspecified cerebrovascular disease affecting right dominant side: Secondary | ICD-10-CM | POA: Diagnosis not present

## 2018-03-01 DIAGNOSIS — E1122 Type 2 diabetes mellitus with diabetic chronic kidney disease: Secondary | ICD-10-CM | POA: Diagnosis not present

## 2018-03-03 DIAGNOSIS — I69951 Hemiplegia and hemiparesis following unspecified cerebrovascular disease affecting right dominant side: Secondary | ICD-10-CM | POA: Diagnosis not present

## 2018-03-03 DIAGNOSIS — M353 Polymyalgia rheumatica: Secondary | ICD-10-CM | POA: Diagnosis not present

## 2018-03-03 DIAGNOSIS — M6259 Muscle wasting and atrophy, not elsewhere classified, multiple sites: Secondary | ICD-10-CM | POA: Diagnosis not present

## 2018-03-03 DIAGNOSIS — R413 Other amnesia: Secondary | ICD-10-CM | POA: Diagnosis not present

## 2018-03-03 DIAGNOSIS — E1122 Type 2 diabetes mellitus with diabetic chronic kidney disease: Secondary | ICD-10-CM | POA: Diagnosis not present

## 2018-03-03 DIAGNOSIS — I429 Cardiomyopathy, unspecified: Secondary | ICD-10-CM | POA: Diagnosis not present

## 2018-03-05 ENCOUNTER — Ambulatory Visit (INDEPENDENT_AMBULATORY_CARE_PROVIDER_SITE_OTHER): Payer: Medicare Other | Admitting: *Deleted

## 2018-03-05 DIAGNOSIS — E1122 Type 2 diabetes mellitus with diabetic chronic kidney disease: Secondary | ICD-10-CM | POA: Diagnosis not present

## 2018-03-05 DIAGNOSIS — R413 Other amnesia: Secondary | ICD-10-CM | POA: Diagnosis not present

## 2018-03-05 DIAGNOSIS — M353 Polymyalgia rheumatica: Secondary | ICD-10-CM | POA: Diagnosis not present

## 2018-03-05 DIAGNOSIS — I69951 Hemiplegia and hemiparesis following unspecified cerebrovascular disease affecting right dominant side: Secondary | ICD-10-CM | POA: Diagnosis not present

## 2018-03-05 DIAGNOSIS — I63139 Cerebral infarction due to embolism of unspecified carotid artery: Secondary | ICD-10-CM | POA: Diagnosis not present

## 2018-03-05 DIAGNOSIS — I429 Cardiomyopathy, unspecified: Secondary | ICD-10-CM | POA: Diagnosis not present

## 2018-03-05 DIAGNOSIS — M6259 Muscle wasting and atrophy, not elsewhere classified, multiple sites: Secondary | ICD-10-CM | POA: Diagnosis not present

## 2018-03-06 DIAGNOSIS — N390 Urinary tract infection, site not specified: Secondary | ICD-10-CM | POA: Diagnosis not present

## 2018-03-08 DIAGNOSIS — R413 Other amnesia: Secondary | ICD-10-CM | POA: Diagnosis not present

## 2018-03-08 DIAGNOSIS — I69951 Hemiplegia and hemiparesis following unspecified cerebrovascular disease affecting right dominant side: Secondary | ICD-10-CM | POA: Diagnosis not present

## 2018-03-08 DIAGNOSIS — M353 Polymyalgia rheumatica: Secondary | ICD-10-CM | POA: Diagnosis not present

## 2018-03-08 DIAGNOSIS — M6259 Muscle wasting and atrophy, not elsewhere classified, multiple sites: Secondary | ICD-10-CM | POA: Diagnosis not present

## 2018-03-08 DIAGNOSIS — I429 Cardiomyopathy, unspecified: Secondary | ICD-10-CM | POA: Diagnosis not present

## 2018-03-08 DIAGNOSIS — E1122 Type 2 diabetes mellitus with diabetic chronic kidney disease: Secondary | ICD-10-CM | POA: Diagnosis not present

## 2018-03-08 NOTE — Progress Notes (Signed)
[facility] Summary Report / Loop Recorder 

## 2018-03-09 DIAGNOSIS — M6259 Muscle wasting and atrophy, not elsewhere classified, multiple sites: Secondary | ICD-10-CM | POA: Diagnosis not present

## 2018-03-09 DIAGNOSIS — I69951 Hemiplegia and hemiparesis following unspecified cerebrovascular disease affecting right dominant side: Secondary | ICD-10-CM | POA: Diagnosis not present

## 2018-03-09 DIAGNOSIS — I429 Cardiomyopathy, unspecified: Secondary | ICD-10-CM | POA: Diagnosis not present

## 2018-03-09 DIAGNOSIS — E1122 Type 2 diabetes mellitus with diabetic chronic kidney disease: Secondary | ICD-10-CM | POA: Diagnosis not present

## 2018-03-09 DIAGNOSIS — R413 Other amnesia: Secondary | ICD-10-CM | POA: Diagnosis not present

## 2018-03-09 DIAGNOSIS — M353 Polymyalgia rheumatica: Secondary | ICD-10-CM | POA: Diagnosis not present

## 2018-03-10 DIAGNOSIS — M79674 Pain in right toe(s): Secondary | ICD-10-CM | POA: Diagnosis not present

## 2018-03-10 DIAGNOSIS — I429 Cardiomyopathy, unspecified: Secondary | ICD-10-CM | POA: Diagnosis not present

## 2018-03-10 DIAGNOSIS — M353 Polymyalgia rheumatica: Secondary | ICD-10-CM | POA: Diagnosis not present

## 2018-03-10 DIAGNOSIS — I69951 Hemiplegia and hemiparesis following unspecified cerebrovascular disease affecting right dominant side: Secondary | ICD-10-CM | POA: Diagnosis not present

## 2018-03-10 DIAGNOSIS — M79675 Pain in left toe(s): Secondary | ICD-10-CM | POA: Diagnosis not present

## 2018-03-10 DIAGNOSIS — R413 Other amnesia: Secondary | ICD-10-CM | POA: Diagnosis not present

## 2018-03-10 DIAGNOSIS — M6259 Muscle wasting and atrophy, not elsewhere classified, multiple sites: Secondary | ICD-10-CM | POA: Diagnosis not present

## 2018-03-10 DIAGNOSIS — B351 Tinea unguium: Secondary | ICD-10-CM | POA: Diagnosis not present

## 2018-03-10 DIAGNOSIS — E1122 Type 2 diabetes mellitus with diabetic chronic kidney disease: Secondary | ICD-10-CM | POA: Diagnosis not present

## 2018-03-11 DIAGNOSIS — E1122 Type 2 diabetes mellitus with diabetic chronic kidney disease: Secondary | ICD-10-CM | POA: Diagnosis not present

## 2018-03-11 DIAGNOSIS — M353 Polymyalgia rheumatica: Secondary | ICD-10-CM | POA: Diagnosis not present

## 2018-03-11 DIAGNOSIS — M6259 Muscle wasting and atrophy, not elsewhere classified, multiple sites: Secondary | ICD-10-CM | POA: Diagnosis not present

## 2018-03-11 DIAGNOSIS — I69951 Hemiplegia and hemiparesis following unspecified cerebrovascular disease affecting right dominant side: Secondary | ICD-10-CM | POA: Diagnosis not present

## 2018-03-11 DIAGNOSIS — I429 Cardiomyopathy, unspecified: Secondary | ICD-10-CM | POA: Diagnosis not present

## 2018-03-11 DIAGNOSIS — R413 Other amnesia: Secondary | ICD-10-CM | POA: Diagnosis not present

## 2018-03-12 DIAGNOSIS — I429 Cardiomyopathy, unspecified: Secondary | ICD-10-CM | POA: Diagnosis not present

## 2018-03-12 DIAGNOSIS — E1122 Type 2 diabetes mellitus with diabetic chronic kidney disease: Secondary | ICD-10-CM | POA: Diagnosis not present

## 2018-03-12 DIAGNOSIS — I69951 Hemiplegia and hemiparesis following unspecified cerebrovascular disease affecting right dominant side: Secondary | ICD-10-CM | POA: Diagnosis not present

## 2018-03-12 DIAGNOSIS — R413 Other amnesia: Secondary | ICD-10-CM | POA: Diagnosis not present

## 2018-03-12 DIAGNOSIS — M353 Polymyalgia rheumatica: Secondary | ICD-10-CM | POA: Diagnosis not present

## 2018-03-12 DIAGNOSIS — M6259 Muscle wasting and atrophy, not elsewhere classified, multiple sites: Secondary | ICD-10-CM | POA: Diagnosis not present

## 2018-03-15 DIAGNOSIS — M353 Polymyalgia rheumatica: Secondary | ICD-10-CM | POA: Diagnosis not present

## 2018-03-15 DIAGNOSIS — E1122 Type 2 diabetes mellitus with diabetic chronic kidney disease: Secondary | ICD-10-CM | POA: Diagnosis not present

## 2018-03-15 DIAGNOSIS — R413 Other amnesia: Secondary | ICD-10-CM | POA: Diagnosis not present

## 2018-03-15 DIAGNOSIS — M6259 Muscle wasting and atrophy, not elsewhere classified, multiple sites: Secondary | ICD-10-CM | POA: Diagnosis not present

## 2018-03-15 DIAGNOSIS — I69951 Hemiplegia and hemiparesis following unspecified cerebrovascular disease affecting right dominant side: Secondary | ICD-10-CM | POA: Diagnosis not present

## 2018-03-15 DIAGNOSIS — I429 Cardiomyopathy, unspecified: Secondary | ICD-10-CM | POA: Diagnosis not present

## 2018-03-16 DIAGNOSIS — I69951 Hemiplegia and hemiparesis following unspecified cerebrovascular disease affecting right dominant side: Secondary | ICD-10-CM | POA: Diagnosis not present

## 2018-03-16 DIAGNOSIS — M353 Polymyalgia rheumatica: Secondary | ICD-10-CM | POA: Diagnosis not present

## 2018-03-16 DIAGNOSIS — M6259 Muscle wasting and atrophy, not elsewhere classified, multiple sites: Secondary | ICD-10-CM | POA: Diagnosis not present

## 2018-03-16 DIAGNOSIS — I429 Cardiomyopathy, unspecified: Secondary | ICD-10-CM | POA: Diagnosis not present

## 2018-03-16 DIAGNOSIS — R413 Other amnesia: Secondary | ICD-10-CM | POA: Diagnosis not present

## 2018-03-16 DIAGNOSIS — E1122 Type 2 diabetes mellitus with diabetic chronic kidney disease: Secondary | ICD-10-CM | POA: Diagnosis not present

## 2018-03-17 DIAGNOSIS — E1122 Type 2 diabetes mellitus with diabetic chronic kidney disease: Secondary | ICD-10-CM | POA: Diagnosis not present

## 2018-03-17 DIAGNOSIS — M6259 Muscle wasting and atrophy, not elsewhere classified, multiple sites: Secondary | ICD-10-CM | POA: Diagnosis not present

## 2018-03-17 DIAGNOSIS — I69951 Hemiplegia and hemiparesis following unspecified cerebrovascular disease affecting right dominant side: Secondary | ICD-10-CM | POA: Diagnosis not present

## 2018-03-17 DIAGNOSIS — I429 Cardiomyopathy, unspecified: Secondary | ICD-10-CM | POA: Diagnosis not present

## 2018-03-17 DIAGNOSIS — R413 Other amnesia: Secondary | ICD-10-CM | POA: Diagnosis not present

## 2018-03-17 DIAGNOSIS — M353 Polymyalgia rheumatica: Secondary | ICD-10-CM | POA: Diagnosis not present

## 2018-03-18 DIAGNOSIS — I69951 Hemiplegia and hemiparesis following unspecified cerebrovascular disease affecting right dominant side: Secondary | ICD-10-CM | POA: Diagnosis not present

## 2018-03-18 DIAGNOSIS — R413 Other amnesia: Secondary | ICD-10-CM | POA: Diagnosis not present

## 2018-03-18 DIAGNOSIS — I429 Cardiomyopathy, unspecified: Secondary | ICD-10-CM | POA: Diagnosis not present

## 2018-03-18 DIAGNOSIS — E1122 Type 2 diabetes mellitus with diabetic chronic kidney disease: Secondary | ICD-10-CM | POA: Diagnosis not present

## 2018-03-18 DIAGNOSIS — M353 Polymyalgia rheumatica: Secondary | ICD-10-CM | POA: Diagnosis not present

## 2018-03-18 DIAGNOSIS — M6259 Muscle wasting and atrophy, not elsewhere classified, multiple sites: Secondary | ICD-10-CM | POA: Diagnosis not present

## 2018-03-19 DIAGNOSIS — M6259 Muscle wasting and atrophy, not elsewhere classified, multiple sites: Secondary | ICD-10-CM | POA: Diagnosis not present

## 2018-03-19 DIAGNOSIS — M353 Polymyalgia rheumatica: Secondary | ICD-10-CM | POA: Diagnosis not present

## 2018-03-19 DIAGNOSIS — I429 Cardiomyopathy, unspecified: Secondary | ICD-10-CM | POA: Diagnosis not present

## 2018-03-19 DIAGNOSIS — E1122 Type 2 diabetes mellitus with diabetic chronic kidney disease: Secondary | ICD-10-CM | POA: Diagnosis not present

## 2018-03-19 DIAGNOSIS — R413 Other amnesia: Secondary | ICD-10-CM | POA: Diagnosis not present

## 2018-03-19 DIAGNOSIS — I69951 Hemiplegia and hemiparesis following unspecified cerebrovascular disease affecting right dominant side: Secondary | ICD-10-CM | POA: Diagnosis not present

## 2018-03-22 DIAGNOSIS — M25562 Pain in left knee: Secondary | ICD-10-CM | POA: Diagnosis not present

## 2018-03-22 DIAGNOSIS — R2689 Other abnormalities of gait and mobility: Secondary | ICD-10-CM | POA: Diagnosis not present

## 2018-03-22 DIAGNOSIS — R278 Other lack of coordination: Secondary | ICD-10-CM | POA: Diagnosis not present

## 2018-03-22 DIAGNOSIS — N182 Chronic kidney disease, stage 2 (mild): Secondary | ICD-10-CM | POA: Diagnosis not present

## 2018-03-22 DIAGNOSIS — R293 Abnormal posture: Secondary | ICD-10-CM | POA: Diagnosis not present

## 2018-03-22 DIAGNOSIS — I429 Cardiomyopathy, unspecified: Secondary | ICD-10-CM | POA: Diagnosis not present

## 2018-03-22 DIAGNOSIS — M353 Polymyalgia rheumatica: Secondary | ICD-10-CM | POA: Diagnosis not present

## 2018-03-22 DIAGNOSIS — R2681 Unsteadiness on feet: Secondary | ICD-10-CM | POA: Diagnosis not present

## 2018-03-22 DIAGNOSIS — R413 Other amnesia: Secondary | ICD-10-CM | POA: Diagnosis not present

## 2018-03-22 DIAGNOSIS — M6259 Muscle wasting and atrophy, not elsewhere classified, multiple sites: Secondary | ICD-10-CM | POA: Diagnosis not present

## 2018-03-22 DIAGNOSIS — E1122 Type 2 diabetes mellitus with diabetic chronic kidney disease: Secondary | ICD-10-CM | POA: Diagnosis not present

## 2018-03-22 DIAGNOSIS — I69951 Hemiplegia and hemiparesis following unspecified cerebrovascular disease affecting right dominant side: Secondary | ICD-10-CM | POA: Diagnosis not present

## 2018-03-22 DIAGNOSIS — M25511 Pain in right shoulder: Secondary | ICD-10-CM | POA: Diagnosis not present

## 2018-03-22 DIAGNOSIS — M6281 Muscle weakness (generalized): Secondary | ICD-10-CM | POA: Diagnosis not present

## 2018-03-23 DIAGNOSIS — I69951 Hemiplegia and hemiparesis following unspecified cerebrovascular disease affecting right dominant side: Secondary | ICD-10-CM | POA: Diagnosis not present

## 2018-03-23 DIAGNOSIS — I429 Cardiomyopathy, unspecified: Secondary | ICD-10-CM | POA: Diagnosis not present

## 2018-03-23 DIAGNOSIS — E1122 Type 2 diabetes mellitus with diabetic chronic kidney disease: Secondary | ICD-10-CM | POA: Diagnosis not present

## 2018-03-23 DIAGNOSIS — R413 Other amnesia: Secondary | ICD-10-CM | POA: Diagnosis not present

## 2018-03-23 DIAGNOSIS — M6259 Muscle wasting and atrophy, not elsewhere classified, multiple sites: Secondary | ICD-10-CM | POA: Diagnosis not present

## 2018-03-23 DIAGNOSIS — M353 Polymyalgia rheumatica: Secondary | ICD-10-CM | POA: Diagnosis not present

## 2018-03-24 DIAGNOSIS — I69951 Hemiplegia and hemiparesis following unspecified cerebrovascular disease affecting right dominant side: Secondary | ICD-10-CM | POA: Diagnosis not present

## 2018-03-24 DIAGNOSIS — E1122 Type 2 diabetes mellitus with diabetic chronic kidney disease: Secondary | ICD-10-CM | POA: Diagnosis not present

## 2018-03-24 DIAGNOSIS — R413 Other amnesia: Secondary | ICD-10-CM | POA: Diagnosis not present

## 2018-03-24 DIAGNOSIS — I429 Cardiomyopathy, unspecified: Secondary | ICD-10-CM | POA: Diagnosis not present

## 2018-03-24 DIAGNOSIS — M353 Polymyalgia rheumatica: Secondary | ICD-10-CM | POA: Diagnosis not present

## 2018-03-24 DIAGNOSIS — M6259 Muscle wasting and atrophy, not elsewhere classified, multiple sites: Secondary | ICD-10-CM | POA: Diagnosis not present

## 2018-03-25 DIAGNOSIS — R413 Other amnesia: Secondary | ICD-10-CM | POA: Diagnosis not present

## 2018-03-25 DIAGNOSIS — I69951 Hemiplegia and hemiparesis following unspecified cerebrovascular disease affecting right dominant side: Secondary | ICD-10-CM | POA: Diagnosis not present

## 2018-03-25 DIAGNOSIS — M6259 Muscle wasting and atrophy, not elsewhere classified, multiple sites: Secondary | ICD-10-CM | POA: Diagnosis not present

## 2018-03-25 DIAGNOSIS — E1122 Type 2 diabetes mellitus with diabetic chronic kidney disease: Secondary | ICD-10-CM | POA: Diagnosis not present

## 2018-03-25 DIAGNOSIS — M353 Polymyalgia rheumatica: Secondary | ICD-10-CM | POA: Diagnosis not present

## 2018-03-25 DIAGNOSIS — I429 Cardiomyopathy, unspecified: Secondary | ICD-10-CM | POA: Diagnosis not present

## 2018-03-26 DIAGNOSIS — M353 Polymyalgia rheumatica: Secondary | ICD-10-CM | POA: Diagnosis not present

## 2018-03-26 DIAGNOSIS — I429 Cardiomyopathy, unspecified: Secondary | ICD-10-CM | POA: Diagnosis not present

## 2018-03-26 DIAGNOSIS — I69951 Hemiplegia and hemiparesis following unspecified cerebrovascular disease affecting right dominant side: Secondary | ICD-10-CM | POA: Diagnosis not present

## 2018-03-26 DIAGNOSIS — R413 Other amnesia: Secondary | ICD-10-CM | POA: Diagnosis not present

## 2018-03-26 DIAGNOSIS — M6259 Muscle wasting and atrophy, not elsewhere classified, multiple sites: Secondary | ICD-10-CM | POA: Diagnosis not present

## 2018-03-26 DIAGNOSIS — E1122 Type 2 diabetes mellitus with diabetic chronic kidney disease: Secondary | ICD-10-CM | POA: Diagnosis not present

## 2018-03-29 DIAGNOSIS — E1122 Type 2 diabetes mellitus with diabetic chronic kidney disease: Secondary | ICD-10-CM | POA: Diagnosis not present

## 2018-03-29 DIAGNOSIS — I429 Cardiomyopathy, unspecified: Secondary | ICD-10-CM | POA: Diagnosis not present

## 2018-03-29 DIAGNOSIS — M353 Polymyalgia rheumatica: Secondary | ICD-10-CM | POA: Diagnosis not present

## 2018-03-29 DIAGNOSIS — M6259 Muscle wasting and atrophy, not elsewhere classified, multiple sites: Secondary | ICD-10-CM | POA: Diagnosis not present

## 2018-03-29 DIAGNOSIS — I69951 Hemiplegia and hemiparesis following unspecified cerebrovascular disease affecting right dominant side: Secondary | ICD-10-CM | POA: Diagnosis not present

## 2018-03-29 DIAGNOSIS — R413 Other amnesia: Secondary | ICD-10-CM | POA: Diagnosis not present

## 2018-03-30 DIAGNOSIS — I429 Cardiomyopathy, unspecified: Secondary | ICD-10-CM | POA: Diagnosis not present

## 2018-03-30 DIAGNOSIS — R413 Other amnesia: Secondary | ICD-10-CM | POA: Diagnosis not present

## 2018-03-30 DIAGNOSIS — M353 Polymyalgia rheumatica: Secondary | ICD-10-CM | POA: Diagnosis not present

## 2018-03-30 DIAGNOSIS — M6259 Muscle wasting and atrophy, not elsewhere classified, multiple sites: Secondary | ICD-10-CM | POA: Diagnosis not present

## 2018-03-30 DIAGNOSIS — E1122 Type 2 diabetes mellitus with diabetic chronic kidney disease: Secondary | ICD-10-CM | POA: Diagnosis not present

## 2018-03-30 DIAGNOSIS — I69951 Hemiplegia and hemiparesis following unspecified cerebrovascular disease affecting right dominant side: Secondary | ICD-10-CM | POA: Diagnosis not present

## 2018-03-31 DIAGNOSIS — R413 Other amnesia: Secondary | ICD-10-CM | POA: Diagnosis not present

## 2018-03-31 DIAGNOSIS — I69951 Hemiplegia and hemiparesis following unspecified cerebrovascular disease affecting right dominant side: Secondary | ICD-10-CM | POA: Diagnosis not present

## 2018-03-31 DIAGNOSIS — M353 Polymyalgia rheumatica: Secondary | ICD-10-CM | POA: Diagnosis not present

## 2018-03-31 DIAGNOSIS — E1122 Type 2 diabetes mellitus with diabetic chronic kidney disease: Secondary | ICD-10-CM | POA: Diagnosis not present

## 2018-03-31 DIAGNOSIS — I429 Cardiomyopathy, unspecified: Secondary | ICD-10-CM | POA: Diagnosis not present

## 2018-03-31 DIAGNOSIS — M6259 Muscle wasting and atrophy, not elsewhere classified, multiple sites: Secondary | ICD-10-CM | POA: Diagnosis not present

## 2018-04-01 DIAGNOSIS — I429 Cardiomyopathy, unspecified: Secondary | ICD-10-CM | POA: Diagnosis not present

## 2018-04-01 DIAGNOSIS — R413 Other amnesia: Secondary | ICD-10-CM | POA: Diagnosis not present

## 2018-04-01 DIAGNOSIS — E1122 Type 2 diabetes mellitus with diabetic chronic kidney disease: Secondary | ICD-10-CM | POA: Diagnosis not present

## 2018-04-01 DIAGNOSIS — M6259 Muscle wasting and atrophy, not elsewhere classified, multiple sites: Secondary | ICD-10-CM | POA: Diagnosis not present

## 2018-04-01 DIAGNOSIS — I69951 Hemiplegia and hemiparesis following unspecified cerebrovascular disease affecting right dominant side: Secondary | ICD-10-CM | POA: Diagnosis not present

## 2018-04-01 DIAGNOSIS — M353 Polymyalgia rheumatica: Secondary | ICD-10-CM | POA: Diagnosis not present

## 2018-04-02 DIAGNOSIS — E1122 Type 2 diabetes mellitus with diabetic chronic kidney disease: Secondary | ICD-10-CM | POA: Diagnosis not present

## 2018-04-02 DIAGNOSIS — M6259 Muscle wasting and atrophy, not elsewhere classified, multiple sites: Secondary | ICD-10-CM | POA: Diagnosis not present

## 2018-04-02 DIAGNOSIS — M353 Polymyalgia rheumatica: Secondary | ICD-10-CM | POA: Diagnosis not present

## 2018-04-02 DIAGNOSIS — I69951 Hemiplegia and hemiparesis following unspecified cerebrovascular disease affecting right dominant side: Secondary | ICD-10-CM | POA: Diagnosis not present

## 2018-04-02 DIAGNOSIS — R413 Other amnesia: Secondary | ICD-10-CM | POA: Diagnosis not present

## 2018-04-02 DIAGNOSIS — I429 Cardiomyopathy, unspecified: Secondary | ICD-10-CM | POA: Diagnosis not present

## 2018-04-05 DIAGNOSIS — M6259 Muscle wasting and atrophy, not elsewhere classified, multiple sites: Secondary | ICD-10-CM | POA: Diagnosis not present

## 2018-04-05 DIAGNOSIS — M353 Polymyalgia rheumatica: Secondary | ICD-10-CM | POA: Diagnosis not present

## 2018-04-05 DIAGNOSIS — I69951 Hemiplegia and hemiparesis following unspecified cerebrovascular disease affecting right dominant side: Secondary | ICD-10-CM | POA: Diagnosis not present

## 2018-04-05 DIAGNOSIS — I429 Cardiomyopathy, unspecified: Secondary | ICD-10-CM | POA: Diagnosis not present

## 2018-04-05 DIAGNOSIS — E1122 Type 2 diabetes mellitus with diabetic chronic kidney disease: Secondary | ICD-10-CM | POA: Diagnosis not present

## 2018-04-05 DIAGNOSIS — R413 Other amnesia: Secondary | ICD-10-CM | POA: Diagnosis not present

## 2018-04-07 ENCOUNTER — Ambulatory Visit (INDEPENDENT_AMBULATORY_CARE_PROVIDER_SITE_OTHER): Payer: Medicare Other | Admitting: *Deleted

## 2018-04-07 DIAGNOSIS — R413 Other amnesia: Secondary | ICD-10-CM | POA: Diagnosis not present

## 2018-04-07 DIAGNOSIS — I63139 Cerebral infarction due to embolism of unspecified carotid artery: Secondary | ICD-10-CM | POA: Diagnosis not present

## 2018-04-07 DIAGNOSIS — E1122 Type 2 diabetes mellitus with diabetic chronic kidney disease: Secondary | ICD-10-CM | POA: Diagnosis not present

## 2018-04-07 DIAGNOSIS — I69951 Hemiplegia and hemiparesis following unspecified cerebrovascular disease affecting right dominant side: Secondary | ICD-10-CM | POA: Diagnosis not present

## 2018-04-07 DIAGNOSIS — I429 Cardiomyopathy, unspecified: Secondary | ICD-10-CM | POA: Diagnosis not present

## 2018-04-07 DIAGNOSIS — M353 Polymyalgia rheumatica: Secondary | ICD-10-CM | POA: Diagnosis not present

## 2018-04-07 DIAGNOSIS — M6259 Muscle wasting and atrophy, not elsewhere classified, multiple sites: Secondary | ICD-10-CM | POA: Diagnosis not present

## 2018-04-08 DIAGNOSIS — I69951 Hemiplegia and hemiparesis following unspecified cerebrovascular disease affecting right dominant side: Secondary | ICD-10-CM | POA: Diagnosis not present

## 2018-04-08 DIAGNOSIS — I429 Cardiomyopathy, unspecified: Secondary | ICD-10-CM | POA: Diagnosis not present

## 2018-04-08 DIAGNOSIS — E1122 Type 2 diabetes mellitus with diabetic chronic kidney disease: Secondary | ICD-10-CM | POA: Diagnosis not present

## 2018-04-08 DIAGNOSIS — M6259 Muscle wasting and atrophy, not elsewhere classified, multiple sites: Secondary | ICD-10-CM | POA: Diagnosis not present

## 2018-04-08 DIAGNOSIS — R413 Other amnesia: Secondary | ICD-10-CM | POA: Diagnosis not present

## 2018-04-08 DIAGNOSIS — M353 Polymyalgia rheumatica: Secondary | ICD-10-CM | POA: Diagnosis not present

## 2018-04-08 NOTE — Progress Notes (Signed)
[facility] Summary Report / Loop Recorder 

## 2018-04-09 DIAGNOSIS — R413 Other amnesia: Secondary | ICD-10-CM | POA: Diagnosis not present

## 2018-04-09 DIAGNOSIS — E1122 Type 2 diabetes mellitus with diabetic chronic kidney disease: Secondary | ICD-10-CM | POA: Diagnosis not present

## 2018-04-09 DIAGNOSIS — I69951 Hemiplegia and hemiparesis following unspecified cerebrovascular disease affecting right dominant side: Secondary | ICD-10-CM | POA: Diagnosis not present

## 2018-04-09 DIAGNOSIS — I429 Cardiomyopathy, unspecified: Secondary | ICD-10-CM | POA: Diagnosis not present

## 2018-04-09 DIAGNOSIS — M353 Polymyalgia rheumatica: Secondary | ICD-10-CM | POA: Diagnosis not present

## 2018-04-09 DIAGNOSIS — M6259 Muscle wasting and atrophy, not elsewhere classified, multiple sites: Secondary | ICD-10-CM | POA: Diagnosis not present

## 2018-04-10 LAB — CUP PACEART REMOTE DEVICE CHECK
Date Time Interrogation Session: 20190615023526
Implantable Pulse Generator Implant Date: 20160816

## 2018-04-20 NOTE — Progress Notes (Signed)
[facility]  PATIENT: Cathy Taylor DOB: 1932-11-17   REASON FOR VISIT: Follow-up for stroke memory loss HISTORY FROM: Patient and daughter HISTORY OF PRESENT ILLNESS:Last visit 10/10/2016PS : [name] is seen today for first office follow-up visit following admission to [facility] in August 2016 with stroke. Indra Wolters is an 84 y.o. female with a istory of hypertension, hyperlipidemia, diabetes mellitus, breast cancer and polymyalgia rheumatica, who presented with vertigo with nausea she was also noted to have slurred speech and confusion and was sent to the emergency room for evaluation from her Primary care physician's office. No focal weakness was noted. She did not experience diplopia. CT scan of her head showed no acute intracranial abnormality. She was noted to have pinpoint pupils on arrival in the emergency room. Urine drug screen was negativeg. Patient has not been on antiplatelets therapy. She had no history of stroke nor TIA. MRI of the brain showed a large acute right cerebellar ischemic infarction. LSN: 05/03/2015 tPA Given: No: Beyond time window for treatment consideration. She was admitted to the neurological unit where she remained stable. MRI scan of the brain showed an acute large superior cerebellar artery infarct on the right side. There is no hydrocephalus. MRA showed occlusion of the right superior cerebellar artery proximally. CT angiogram showed 75% stenosis of the right ICA with dense atheromatous calcification in less than 50% stenosis of left ICA. Carotid ultrasound showed only however 40-59% ICA stenosis bilaterally with the acoustic shadowing due to calcific plaques which may obscure higher velocities. CT angiogram hence was obtained which confirm 75% right ICA stenosis which was asymptomatic. LDL cholesterol was elevated at 88 mg percent. Transthoracic echo showed ejection fraction of 30-35% but no cardiac source of emboli. Transesophageal  echocardiogram confirmed low cardiac ejection fraction but showed no cardiac source of embolism. Hemoglobin A1c was 6.6. Patient had loop recorder inserted for paroxysmal A. fib and so far negative has not had been found. Patient was transferred to skilled nursing facility for inpatient rehabilitation and made gradual improvement and is currently at home for the last 1 week. She is able to walk with a walker but needs a one-person assist. She has lack of motivation and has not been eating well due to decreased appetite and nausea. Patient is also under a lot of stress, husband is not well. She does take Cymbalta 30 mg daily but has not yet tried a higher dose. The family feels that is not helping. She still has some dysarthria and right hemiparesis and dysarthria seems to get worse in the afternoon when she is tired. Update 4/11/2017PS : She returns for follow-up today after last visit 6 months ago. She is accompanied by her granddaughter and great-granddaughter. Patient continues to live at home but now she has 24-hour care. She's had a few falls when she is not careful. She does use a walker mostly but needs 1 person standing right behind her because she has a tendency to fall backwards. The family has noticed significant worsening of cognition and short-term memory. She has not been evaluated for dementia or tried any medications for that. She needs some help but is continent and can use the toilet and dress herself with minimum help. She remains on aspirin for stroke prevention and has not had any recurrent strokes or TIAs. She is tolerating Lipitor well without myalgias or arthralgias. Blood pressure seems well controlled and today it is 109/67 in office. Her sugars have also been well controlled. She is  does not walk a whole lot and family physician and benefits with in-home physical and occupational therapy. On Mini-Mental status exam testing today she scored 17/30 with deficits in orientation, attention and  calculation and recall and following 3 step commands. She has not been on medications like Aricept she was hospitalized have in December 2016 with transient slurred speech and facial weakness in the setting of being started on new medication Remeron. I have personally reviewed the hospital records and MRI scan the brain did not show an acute infarct. Patient was felt to have transient encephalopathy secondary to starting the new medication Remeron and her deficits.cleared quickly. UPDATE 04/15/2016 CM [name], 84 year old female returns for follow-up. Patient has a history of stroke which occurred in August 2016.MRI of the brain showed a large acute right cerebellar ischemic infarction. Her aspirin was recently reduced to 0.81 by her cardiologist. She has not had further stroke or TIA symptoms. When last seen by Dr. Leonie Man, she was having more problems with cognition and short-term memory. She was placed on Aricept 5 mg for one month and is now taking 2 tablets a day. EEG was normal,  dementia labs return normal except for elevated homocysteine. She continues to have 24/ 7 care. She walks very little and gets around with a walker. Sugars have been well controlled according to granddaughter who provides her history today. In December 2016 the patient was admitted for transient slurred speech and facial weakness, MRI scan of the brain did not show an acute infarct. It was felt that she had a transient loss secondary to starting a new medication Remeron. She returns for reevaluation. The daughter is requesting some home physical therapy UPDATE 01/25/2018CM [name], 84 year old female returns for follow-up with her caregiver. Patient has a history of stroke event in August 2016. She is currently on aspirin for secondary stroke prevention. She has not had further stroke or TIA symptoms she was placed on Aricept by Dr. Leonie Man for short-term memory issues. Dementia labs  returned normal. She walks very little due to  arthritic pain in both knees. She ambulates with a rolling walker. She has 24/ 7 care in the home. Her husband has Parkinson's disease and dementia. She has some physical therapy after her last visit but does not  do the home exercise program. She remains on Lipitor for hyperlipidemia. Blood pressure in the office today 145/78. She returns for reevaluation Update 7/30/2018PS; She returns for follow-up after last visit 6 months ago. She is accompanied by daughter. Patient has moved into [facility] skilled nursing facility since January. She and her husband who also has Parkinson's and dementia but not able to get 24-hour caregiver help at home. They're now both live in the same facility in different rooms. Patient has functional decline and is no longer walking. She spends most of the time in the chair. She did get some physical therapy early on and was able to walk with help but has not done so for several months. She denies any new or recurrent stroke symptoms. She states her memory is unchanged but daughter does state that she does get confused at times. She remains on Aricept 10 mg daily which is tolerating well without any GI or CNS side effects. Her blood pressure is well controlled though today it is slightly elevated at 148/60. She remains on aspirin which is tolerating well without bruising or bleeding. He also states that the cholesterol and sugars seem to be well controlled though I do not  have any recent lab work to review. UPDATE 1/30/2019CM Ms. Paschal, 84 year old female returns for follow-up with history of stroke in August 2016.  She remains on aspirin for secondary stroke prevention without further stroke or TIA symptoms.  She has minimal bruising and no bleeding.  She is also on Lipitor, no myalgias.  She has moved to [facility] skilled facility [place].  Her husband resides there as well.  She is seated in wheelchair in no longer walks except with assistance.  She  requires assistance with activities of daily living but she can feed herself.  She states her memory is unchanged.  She remains on Aricept 10 mg without GI side effects.  Blood pressure in the office today well controlled at 130/62.  MMSE is stable at 22 out of 30.  She misses 2-3 recall.  She returns for reevaluation UPDATE 7/31/2019CM.  Ms.Allender, 84 year old female returns for follow-up with history of stroke in August 2016 she also has memory loss.  She currently resides in a skilled facility in [place].  She is a DO NOT RESUSCITATE.  She remains on aspirin for secondary stroke prevention without recurrent stroke or TIA symptoms she has minimal bruising and no bleeding.  She remains on Lipitor without myalgias.  She is seated in a wheelchair today at risk for falls in the facility.  Uses the wheelchair most of the time.  She remains on Aricept for memory loss without side effects.  Memory score is stable.  Blood pressure in the office today 107/69.  Fluids were encouraged.  She stays active by playing bingo and mingling with the other patients.  She returns for reevaluation REVIEW OF SYSTEMS: Full 14 system review of systems performed and notable only for those listed, all others are neg:  Constitutional: neg  Cardiovascular: neg Ear/Nose/Throat: neg  Skin: neg Eyes: neg Respiratory: neg Gastroitestinal: neg  Hematology/Lymphatic: neg  Endocrine: neg Musculoskeletal: Walking difficulty, joint pain  Allergy/Immunology: neg Neurological: Memory loss, history of stroke Psychiatric: neg Sleep : neg   ALLERGIES: No Known Allergies  HOME MEDICATIONS: Outpatient Medications Prior to Visit  Medication Sig Dispense Refill  . acetaminophen (TYLENOL) 325 MG tablet Take 650 mg by mouth every 6 (six) hours as needed for mild pain.    Marland Kitchen aspirin 81 MG tablet Take 1 tablet (81 mg total) by mouth daily.    . carvedilol (COREG) 6.25 MG tablet Take 1 tablet (6.25 mg total) by mouth 2 (two)  times daily with a meal.    . Cholecalciferol (VITAMIN D-3 PO) Take 1,000 mg by mouth daily.     Marland Kitchen donepezil (ARICEPT) 10 MG tablet Take 10 mg by mouth daily.    . folic acid (FOLVITE) 1 MG tablet Take 1 mg by mouth daily.    Marland Kitchen gabapentin (NEURONTIN) 100 MG capsule 100 mg 2 (two) times daily.    Marland Kitchen lisinopril (PRINIVIL,ZESTRIL) 5 MG tablet Take 1 tablet (5 mg total) by mouth daily. 90 tablet 3  . loratadine (CLARITIN) 10 MG tablet Take 10 mg by mouth daily.    . metFORMIN (GLUCOPHAGE) 500 MG tablet Take 500 mg by mouth 2 (two) times daily.  10  . mirtazapine (REMERON) 15 MG tablet Take 7.5 mg by mouth at bedtime.  10  . omeprazole (PRILOSEC) 40 MG capsule   10  . ondansetron (ZOFRAN) 4 MG tablet Take 4 mg by mouth every 8 (eight) hours as needed for nausea or vomiting.    . sertraline (ZOLOFT)  50 MG tablet 50 mg daily.    Marland Kitchen atorvastatin (LIPITOR) 80 MG tablet Take 1 tablet (80 mg total) by mouth daily at 6 PM. (Patient not taking: Reported on 10/21/2017)    . DULoxetine (CYMBALTA) 60 MG capsule Take 1 capsule (60 mg total) by mouth daily. (Patient not taking: Reported on 10/21/2017) 30 capsule 5   No facility-administered medications prior to visit.     PAST MEDICAL HISTORY: Past Medical History:  Diagnosis Date  . Abdominal hernia   . Anxiety   . Arthritis   . Breast cancer ([place]) 1973   right  . 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 Bronchiectasis 11/12  . Bronchitis       . Chronic back pain greater than 3 months duration   . Depression   . GERD (gastroesophageal reflux disease)   . Heart murmur   . Hypercholesteremia   . Hypertension   . Left ventricular dysfunction 04/2015   EF 30% by TEE, + AK mid-apical anteroseptal and anterior walls plus apex, neg bubble study, grade III plaque asc Ao, suspect ICM  . Osteoarthritis of left shoulder 01/06/2012  . PMR (polymyalgia rheumatica) ([facility])   . Stress fracture of left femur with nonunion 07/08/2011  . Stroke ([place])   . Type II diabetes  mellitus ([place])   . Umbilical hernia 6/71/24   unrepaired    PAST SURGICAL HISTORY: Past Surgical History:  Procedure Laterality Date  . BREAST BIOPSY  1981   left  . CATARACT EXTRACTION W/ INTRAOCULAR LENS IMPLANT  07/2011   left  . EP IMPLANTABLE DEVICE N/A 05/08/2015   Procedure: Loop Recorder Insertion;  Surgeon: Will Meredith Leeds, MD;  Location: [facility];  Service: Cardiovascular;  Laterality: N/A;  . FEMUR SURGERY  07/07/2011   left rod placed  . MASTECTOMY  1973   right  . SHOULDER ARTHROSCOPY  06/2000   left  . TEE WITHOUT CARDIOVERSION N/A 05/07/2015   Procedure: TRANSESOPHAGEAL ECHOCARDIOGRAM (TEE);  Surgeon: Larey Dresser, MD;  EF 30%, +WMA, no thrombus/PFO, suspect ICM    . TOTAL SHOULDER ARTHROPLASTY  01/06/12   left  . TOTAL SHOULDER ARTHROPLASTY  01/06/2012   Procedure: TOTAL SHOULDER ARTHROPLASTY;  Surgeon: Johnny Bridge, MD;  Location: [facility];  Service: Orthopedics;  Laterality: Left;  . TUBAL LIGATION      FAMILY HISTORY: Family History  Problem Relation Age of Onset  . Heart attack Father   . Pulmonary fibrosis Sister   . Stroke Son   . Stroke Maternal Aunt     SOCIAL HISTORY: Social History   Socioeconomic History  . Marital status: Married    Spouse name: Not on file  . Number of children: Not on file  . Years of education: Not on file  . Highest education level: Not on file  Occupational History  . Not on file  Social Needs  . Financial resource strain: Not on file  . Food insecurity:    Worry: Not on file    Inability: Not on file  . Transportation needs:    Medical: Not on file    Non-medical: Not on file  Tobacco Use  . Smoking status: Never Smoker  . Smokeless tobacco: Former Systems developer    Types: Snuff  . Tobacco comment: "just tried smoking; didn't smoke to amount to nothing"  Substance and Sexual Activity  . Alcohol use: No  . Drug use: No  . Sexual activity: Not Currently  Lifestyle  .  Physical activity:    Days per  week: Not on file    Minutes per session: Not on file  . Stress: Not on file  Relationships  . Social connections:    Talks on phone: Not on file    Gets together: Not on file    Attends religious service: Not on file    Active member of club or organization: Not on file    Attends meetings of clubs or organizations: Not on file    Relationship status: Not on file  . Intimate partner violence:    Fear of current or ex partner: Not on file    Emotionally abused: Not on file    Physically abused: Not on file    Forced sexual activity: Not on file  Other Topics Concern  . Not on file  Social History Narrative   Currently lives with her husband who has Parkinson's Disease and Dementia. Their children live nearby. They have home health caregivers who assist them throughout the week as well.     PHYSICAL EXAM  Vitals:   04/21/18 1035  BP: 107/69  Pulse: 65  Weight: 146 lb 3.2 oz (66.3 kg)  Height: 5\' 3"  (1.6 m)   Body mass index is 25.9 kg/m. General: Frail elderly Caucasian lady, seated, in no evident distress Head: head normocephalic and atraumatic.  Neck: supple with no carotid  bruits Cardiovascular: regular rate and rhythm, no murmurs Musculoskeletal: no deformity. Mild kyphosis Skin:  no rash, minimal bruising on forearms  Neurological examination  Mental Status: Awake and fully alert. MMSE - Mini Mental State Exam 04/21/2018 10/21/2017 04/15/2016  Orientation to time 4 4 4   Orientation to Place 4 5 4   Registration 3 3 3   Attention/ Calculation 5 2 5   Recall 0 1 0  Language- name 2 objects 2 2 2   Language- repeat 0 0 1  Language- follow 3 step command 3 3 3   Language- read & follow direction 1 1 1   Write a sentence 1 1 1   Write a sentence-comments - right hand affected by CVA, read sentence she wrote, was good sentence -  Copy design 0 0 1  Total score 23 22 25     AFT 10. Follows all commands Cranial Nerves:  Pupils equal, briskly reactive to light. Extraocular  movements full without nystagmus. Visual fields full to confrontation. Hearing intact. Facial sensation intact. Face, tongue, palate moves normally and symmetrically.  Motor: Mild right hemiparesis . Weakness of right grip and intrinsic hand muscles. Mild right foot drop. Mild weakness of right hip flexors and ankle dorsiflexors are 4/5 Sensory.: intact to touch ,pinprick .position and vibratory sensation in the upper and lower extremities.  Coordination: Mild impaired right finger-to-nose  coordination. Normal coordination on the left.  Gait and Station: In wheelchair not ambulated due to safety concerns  Reflexes: 1+ and symmetric. Toes downgoing.   DIAGNOSTIC DATA (LABS, IMAGING, TESTING) - I reviewed patient records, labs, notes, testing and imaging myself where available.  Lab Results  Component Value Date   WBC 12.1 (H) 02/08/2016   HGB 12.7 02/08/2016   HCT 40.5 02/08/2016   MCV 86.7 02/08/2016   PLT 257 02/08/2016      Component Value Date/Time   NA 140 02/08/2016 1002   K 4.2 02/08/2016 1002   CL 102 02/08/2016 1002   CO2 24 02/08/2016 1002   GLUCOSE 118 (H) 02/08/2016 1002   BUN 9 02/08/2016 1002   CREATININE 0.78 02/08/2016 1002   CALCIUM  9.9 02/08/2016 1002   PROT 7.3 02/08/2016 1002   ALBUMIN 3.4 (L) 02/08/2016 1002   AST 29 02/08/2016 1002   ALT 25 02/08/2016 1002   ALKPHOS 64 02/08/2016 1002   BILITOT 0.6 02/08/2016 1002   GFRNONAA >60 02/08/2016 1002   GFRAA >60 02/08/2016 1002     Lab Results  Component Value Date   VITAMINB12 368 01/01/2016   Lab Results  Component Value Date   TSH 3.410 01/01/2016      ASSESSMENT AND PLAN 57 year Caucasian lady with history of diabetes mellitus, hypertension, hyperlipidemia, depression, PMR, chronic back pain,  congestive heart failure, previous stroke on August 2016, cardiac loop recorder in place 05/08/15 presenting  In December 2016 with worsening right leg weakness, slurred speech and facial droop in setting  of headache likely encephalopathy from Remeron. MRI showed no acute infarct. Patient has had cognitive impairment and  underlying mild dementia.She has functionally declined and is no longer ambulating except with assistance.    PLAN: Aricept 10 mg 1 tablet daily for memory to continue  Stimulate memory with cross word puzzles, word search etc.bingo  Continue aspirin 0.81 daily for secondary stroke prevention  Blood pressure control below 130/90 today's reading 107/69 continue blood pressure medications, stay well hydrated Keep LDL cholesterol goal below 70 Continue Lipitor Use walker/wheelchair  at all times for safe ambulation, if getting up with assistance only at risk for falls. Follow-up in 6-8 months Greater than 50% of time during this 25 minute visit was spent on counseling,explanation of diagnosis, planning of further management, discussion with patient and daughter and answering questions Dennie Bible, [facility], [facility], APRN  [facility] [address] [place], [place] [address] [phone]

## 2018-04-21 ENCOUNTER — Encounter: Payer: Self-pay | Admitting: Nurse Practitioner

## 2018-04-21 ENCOUNTER — Ambulatory Visit (INDEPENDENT_AMBULATORY_CARE_PROVIDER_SITE_OTHER): Payer: Medicare Other | Admitting: Nurse Practitioner

## 2018-04-21 VITALS — BP 107/69 | HR 65 | Ht 63.0 in | Wt 146.2 lb

## 2018-04-21 DIAGNOSIS — I63139 Cerebral infarction due to embolism of unspecified carotid artery: Secondary | ICD-10-CM

## 2018-04-21 DIAGNOSIS — Z8673 Personal history of transient ischemic attack (TIA), and cerebral infarction without residual deficits: Secondary | ICD-10-CM | POA: Diagnosis not present

## 2018-04-21 DIAGNOSIS — R413 Other amnesia: Secondary | ICD-10-CM

## 2018-04-21 DIAGNOSIS — E785 Hyperlipidemia, unspecified: Secondary | ICD-10-CM | POA: Diagnosis not present

## 2018-04-21 NOTE — Patient Instructions (Signed)
Aricept 10 mg 1 tablet daily for memory to continue  Stimulate memory with cross word puzzles, word search etc.bingo  Continue aspirin 0.81 daily for secondary stroke prevention  Blood pressure control below 130/90 today's reading 107/69 continue blood pressure medications, stay well hydrated Keep LDL cholesterol goal below 70 Continue Lipitor Use walker/wheelchair  at all times for safe ambulation, if getting up with assistance only at risk for falls. Follow-up in 6-8 months

## 2018-04-22 ENCOUNTER — Ambulatory Visit (INDEPENDENT_AMBULATORY_CARE_PROVIDER_SITE_OTHER): Payer: Medicare Other | Admitting: Cardiology

## 2018-04-22 ENCOUNTER — Encounter: Payer: Self-pay | Admitting: Cardiology

## 2018-04-22 VITALS — BP 130/60 | HR 60 | Ht 63.0 in | Wt 148.0 lb

## 2018-04-22 DIAGNOSIS — R0602 Shortness of breath: Secondary | ICD-10-CM | POA: Diagnosis not present

## 2018-04-22 DIAGNOSIS — I10 Essential (primary) hypertension: Secondary | ICD-10-CM

## 2018-04-22 DIAGNOSIS — M79604 Pain in right leg: Secondary | ICD-10-CM

## 2018-04-22 DIAGNOSIS — M79605 Pain in left leg: Secondary | ICD-10-CM

## 2018-04-22 DIAGNOSIS — I639 Cerebral infarction, unspecified: Secondary | ICD-10-CM | POA: Diagnosis not present

## 2018-04-22 DIAGNOSIS — I429 Cardiomyopathy, unspecified: Secondary | ICD-10-CM

## 2018-04-22 NOTE — Progress Notes (Signed)
Cardiology Office Note   Date:  04/22/2018   ID:  Cathy Taylor, DOB November 08, 1932, MRN [id number]  PCP:  Cathy Shivers, MD  Cardiologist:  Dr. Tamala Taylor    Chief Complaint  Patient presents with  . Cardiomyopathy    anorexia  and mild SOB      History of Present Illness: Cathy Taylor is a 84 y.o. female who presents for cardiomyopathy.     She has a history of diabetes, hypertension, PMR and breast cancer. She was admitted for a CVA and had a loop recorder implanted(8/16) as well as suspected ischemic cardiomyopathy with an EF of 30-35 percent and peak troponin of 1.46. Subsequent EF 65-70% 08/2015 on medical therapy.  Today she is with her daughter and [facility]   She has some mild SOB, no chest pain, does complain of feet pain at times.   She and daughter most concerned about her anorexia.  She hardly eats, has no desire to eat.   She will eat sweets.  We discussed ensure or protein drink with ice cream but she is not interested.  She was on some medication for lack of appetite but not currently.  I have asked them to discuss with PCP.      Past Medical History:  Diagnosis Date  . Abdominal hernia   . Anxiety   . Arthritis   . Breast cancer ([place]) 1973   right  . 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 Bronchiectasis 11/12  . Bronchitis       . Chronic back pain greater than 3 months duration   . Depression   . GERD (gastroesophageal reflux disease)   . Heart murmur   . Hypercholesteremia   . Hypertension   . Left ventricular dysfunction 04/2015   EF 30% by TEE, + AK mid-apical anteroseptal and anterior walls plus apex, neg bubble study, grade III plaque asc Ao, suspect ICM  . Osteoarthritis of left shoulder 01/06/2012  . PMR (polymyalgia rheumatica) (HCC)   . Stress fracture of left femur with nonunion 07/08/2011  . Stroke ([place])   . Type II diabetes mellitus ([place])   . Umbilical hernia 3/87/56   unrepaired    Past Surgical History:  Procedure  Laterality Date  . BREAST BIOPSY  1981   left  . CATARACT EXTRACTION W/ INTRAOCULAR LENS IMPLANT  07/2011   left  . EP IMPLANTABLE DEVICE N/A 05/08/2015   Procedure: Loop Recorder Insertion;  Surgeon: Will Meredith Leeds, MD;  Location: [facility];  Service: Cardiovascular;  Laterality: N/A;  . FEMUR SURGERY  07/07/2011   left rod placed  . MASTECTOMY  1973   right  . SHOULDER ARTHROSCOPY  06/2000   left  . TEE WITHOUT CARDIOVERSION N/A 05/07/2015   Procedure: TRANSESOPHAGEAL ECHOCARDIOGRAM (TEE);  Surgeon: Larey Dresser, MD;  EF 30%, +WMA, no thrombus/PFO, suspect ICM    . TOTAL SHOULDER ARTHROPLASTY  01/06/12   left  . TOTAL SHOULDER ARTHROPLASTY  01/06/2012   Procedure: TOTAL SHOULDER ARTHROPLASTY;  Surgeon: Johnny Bridge, MD;  Location: [facility];  Service: Orthopedics;  Laterality: Left;  . TUBAL LIGATION       Current Outpatient Medications  Medication Sig Dispense Refill  . acetaminophen (TYLENOL) 325 MG tablet Take 650 mg by mouth every 6 (six) hours as needed for mild pain.    Marland Kitchen aspirin 81 MG tablet Take 1 tablet (81 mg total) by mouth daily.    Marland Kitchen atorvastatin (LIPITOR)  80 MG tablet Take 1 tablet (80 mg total) by mouth daily at 6 PM.    . carvedilol (COREG) 6.25 MG tablet Take 1 tablet (6.25 mg total) by mouth 2 (two) times daily with a meal.    . Cholecalciferol (VITAMIN D-3 PO) Take 1,000 mg by mouth daily.     Marland Kitchen donepezil (ARICEPT) 10 MG tablet Take 10 mg by mouth daily.    . DULoxetine (CYMBALTA) 60 MG capsule Take 1 capsule (60 mg total) by mouth daily. 30 capsule 5  . folic acid (FOLVITE) 1 MG tablet Take 1 mg by mouth daily.    Marland Kitchen gabapentin (NEURONTIN) 100 MG capsule 100 mg 2 (two) times daily.    Marland Kitchen lisinopril (PRINIVIL,ZESTRIL) 5 MG tablet Take 1 tablet (5 mg total) by mouth daily. 90 tablet 3  . loratadine (CLARITIN) 10 MG tablet Take 10 mg by mouth daily.    . metFORMIN (GLUCOPHAGE) 500 MG tablet Take 500 mg by mouth 2 (two) times daily.  10  . mirtazapine  (REMERON) 15 MG tablet Take 7.5 mg by mouth at bedtime.  10  . omeprazole (PRILOSEC) 40 MG capsule   10  . ondansetron (ZOFRAN) 4 MG tablet Take 4 mg by mouth every 8 (eight) hours as needed for nausea or vomiting.    . sertraline (ZOLOFT) 50 MG tablet 50 mg daily.     No current facility-administered medications for this visit.     Allergies:   Patient has no known allergies.    Social History:  The patient  reports that she has never smoked. She quit smokeless tobacco use about 69 years ago. Her smokeless tobacco use included snuff. She reports that she does not drink alcohol or use drugs.   Family History:  The patient's family history includes Heart attack in her father; Pulmonary fibrosis in her sister; Stroke in her maternal aunt and son.    ROS:  General:no colds or fevers, no weight changes to little, despite complaints of anorexia. Skin:no rashes or ulcers HEENT:no blurred vision, no congestion CV:see HPI PUL:see HPI GI:no diarrhea constipation or melena, no indigestion GU:no hematuria, no dysuria MS:no joint pain, no claudication Neuro:no syncope, no lightheadedness Endo:+ diabetes, no thyroid disease  Wt Readings from Last 3 Encounters:  04/22/18 148 lb (67.1 kg)  04/21/18 146 lb 3.2 oz (66.3 kg)  10/21/17 152 lb (68.9 kg)     PHYSICAL EXAM: VS:  BP 130/60   Pulse 60   Ht 5\' 3"  (1.6 m)   Wt 148 lb (67.1 kg)   SpO2 98%   BMI 26.22 kg/m  , BMI Body mass index is 26.22 kg/m. General:Pleasant affect, NAD Skin:Warm and dry, brisk capillary refill HEENT:normocephalic, sclera clear, mucus membranes moist Neck:supple, no JVD, no bruits  Heart:S1S2 RRR without murmur, gallup, rub or click Lungs:clear without rales, rhonchi, or wheezes MWN:UUVO, non tender, + BS, do not palpate liver spleen or masses Ext:no lower ext edema, 2+ pedal pulses, 2+ radial pulses Neuro:alert and oriented, MAE, follows commands, + facial symmetry    EKG:  EKG is ordered today. The ekg  ordered today demonstrates SB at 8 and no acute changes, + LAFB    Recent Labs: No results found for requested labs within last 8760 hours.    Lipid Panel    Component Value Date/Time   CHOL 82 09/04/2015 1335   TRIG 81 09/04/2015 1335   HDL 29 (L) 09/04/2015 1335   CHOLHDL 2.8 09/04/2015 1335   VLDL 16 09/04/2015  [place] 09/04/2015 1335       Other studies Reviewed: Additional studies/ records that were reviewed today include: . Echo 09/04/15  Study Conclusions  - Left ventricle: The cavity size was normal. There was moderate   concentric hypertrophy. Systolic function was vigorous. The   estimated ejection fraction was in the range of 65% to 70%. Wall   motion was normal; there were no regional wall motion   abnormalities. There was an increased relative contribution of   atrial contraction to ventricular filling. Doppler parameters are   consistent with abnormal left ventricular relaxation (grade 1   diastolic dysfunction). - Aortic valve: Trileaflet; mildly thickened, mildly calcified   leaflets. - Mitral valve: Calcified annulus. - Atrial septum: There was increased thickness of the septum,   consistent with lipomatous hypertrophy.  LINQ interrogations have all been normal rhythm no a fib.  ASSESSMENT AND PLAN:  1.  Cardiomyopathy -euvolemic today - will repeat echo with anorexia to rule out cardiac cachexia if stable she will follow up with Dr. Tamala Taylor in 1 year.  2.   HLD followed by PCP it may be we could decrease the lipitor to see if this would help appetite at all.  3.   Hx of CVA stable. Has loop recorder good for 3 years at least.  No a fib  4.    Anorexia - may need med added, family to discuss with PCP     5.    Leg pain, neuropathy   PT is a DNR   Current medicines are reviewed with the patient today.  The patient Has no concerns regarding medicines.  The following changes have been made:  See above Labs/ tests ordered today  include:see above  Disposition:   FU:  see above  Signed, Cecilie Kicks, NP  04/22/2018 12:05 PM    [facility] [address], [place], [place], [place] Phone: [phone]; Fax: [phone]

## 2018-04-22 NOTE — Patient Instructions (Signed)
Medication Instructions:  Your physician recommends that you continue on your current medications as directed. Please refer to the Current Medication list given to you today.   Labwork: None ordered  Testing/Procedures: Your physician has requested that you have an echocardiogram. Echocardiography is a painless test that uses sound waves to create images of your heart. It provides your doctor with information about the size and shape of your heart and how well your heart's chambers and valves are working. This procedure takes approximately one hour. There are no restrictions for this procedure.    Follow-Up: Your physician wants you to follow-up in: 1 year with Dr. Tamala Julian. You will receive a reminder letter in the mail two months in advance. If you don't receive a letter, please call our office to schedule the follow-up appointment.   Any Other Special Instructions Will Be Listed Below (If Applicable).     If you need a refill on your cardiac medications before your next appointment, please call your pharmacy.  Echocardiogram An echocardiogram, or echocardiography, uses sound waves (ultrasound) to produce an image of your heart. The echocardiogram is simple, painless, obtained within a short period of time, and offers valuable information to your health care provider. The images from an echocardiogram can provide information such as:  Evidence of coronary artery disease (CAD).  Heart size.  Heart muscle function.  Heart valve function.  Aneurysm detection.  Evidence of a past heart attack.  Fluid buildup around the heart.  Heart muscle thickening.  Assess heart valve function.  Tell a health care provider about:  Any allergies you have.  All medicines you are taking, including vitamins, herbs, eye drops, creams, and over-the-counter medicines.  Any problems you or family members have had with anesthetic medicines.  Any blood disorders you have.  Any surgeries you  have had.  Any medical conditions you have.  Whether you are pregnant or may be pregnant. What happens before the procedure? No special preparation is needed. Eat and drink normally. What happens during the procedure?  In order to produce an image of your heart, gel will be applied to your chest and a wand-like tool (transducer) will be moved over your chest. The gel will help transmit the sound waves from the transducer. The sound waves will harmlessly bounce off your heart to allow the heart images to be captured in real-time motion. These images will then be recorded.  You may need an IV to receive a medicine that improves the quality of the pictures. What happens after the procedure? You may return to your normal schedule including diet, activities, and medicines, unless your health care provider tells you otherwise. This information is not intended to replace advice given to you by your health care provider. Make sure you discuss any questions you have with your health care provider. Document Released: 09/05/2000 Document Revised: 04/26/2016 Document Reviewed: 05/16/2013 Elsevier Interactive Patient Education  2017 [employer].

## 2018-04-26 DIAGNOSIS — N183 Chronic kidney disease, stage 3 (moderate): Secondary | ICD-10-CM | POA: Diagnosis not present

## 2018-04-26 DIAGNOSIS — M353 Polymyalgia rheumatica: Secondary | ICD-10-CM | POA: Diagnosis not present

## 2018-04-26 DIAGNOSIS — M159 Polyosteoarthritis, unspecified: Secondary | ICD-10-CM | POA: Diagnosis not present

## 2018-04-26 DIAGNOSIS — E1122 Type 2 diabetes mellitus with diabetic chronic kidney disease: Secondary | ICD-10-CM | POA: Diagnosis not present

## 2018-05-06 ENCOUNTER — Ambulatory Visit (HOSPITAL_COMMUNITY): Payer: Medicare Other | Attending: Cardiology

## 2018-05-06 ENCOUNTER — Other Ambulatory Visit: Payer: Self-pay

## 2018-05-06 DIAGNOSIS — Z8673 Personal history of transient ischemic attack (TIA), and cerebral infarction without residual deficits: Secondary | ICD-10-CM | POA: Diagnosis not present

## 2018-05-06 DIAGNOSIS — I509 Heart failure, unspecified: Secondary | ICD-10-CM | POA: Diagnosis not present

## 2018-05-06 DIAGNOSIS — C50919 Malignant neoplasm of unspecified site of unspecified female breast: Secondary | ICD-10-CM | POA: Insufficient documentation

## 2018-05-06 DIAGNOSIS — R0602 Shortness of breath: Secondary | ICD-10-CM

## 2018-05-06 DIAGNOSIS — I429 Cardiomyopathy, unspecified: Secondary | ICD-10-CM | POA: Diagnosis not present

## 2018-05-06 DIAGNOSIS — E785 Hyperlipidemia, unspecified: Secondary | ICD-10-CM | POA: Insufficient documentation

## 2018-05-06 DIAGNOSIS — R0600 Dyspnea, unspecified: Secondary | ICD-10-CM | POA: Diagnosis not present

## 2018-05-06 DIAGNOSIS — I340 Nonrheumatic mitral (valve) insufficiency: Secondary | ICD-10-CM | POA: Insufficient documentation

## 2018-05-06 DIAGNOSIS — I110 Hypertensive heart disease with heart failure: Secondary | ICD-10-CM | POA: Insufficient documentation

## 2018-05-06 DIAGNOSIS — E119 Type 2 diabetes mellitus without complications: Secondary | ICD-10-CM | POA: Diagnosis not present

## 2018-05-06 MED ORDER — PERFLUTREN LIPID MICROSPHERE
1.0000 mL | INTRAVENOUS | Status: AC | PRN
  Administered 2018-05-06: 2 mL via INTRAVENOUS

## 2018-05-10 ENCOUNTER — Ambulatory Visit (INDEPENDENT_AMBULATORY_CARE_PROVIDER_SITE_OTHER): Payer: Medicare Other | Admitting: *Deleted

## 2018-05-10 DIAGNOSIS — I639 Cerebral infarction, unspecified: Secondary | ICD-10-CM | POA: Diagnosis not present

## 2018-05-11 NOTE — Progress Notes (Signed)
[facility] Summary Report / Loop Recorder 

## 2018-05-12 DIAGNOSIS — B351 Tinea unguium: Secondary | ICD-10-CM | POA: Diagnosis not present

## 2018-05-12 DIAGNOSIS — M79675 Pain in left toe(s): Secondary | ICD-10-CM | POA: Diagnosis not present

## 2018-05-12 DIAGNOSIS — M79674 Pain in right toe(s): Secondary | ICD-10-CM | POA: Diagnosis not present

## 2018-05-17 ENCOUNTER — Encounter: Payer: Self-pay | Admitting: Nurse Practitioner

## 2018-05-25 LAB — CUP PACEART REMOTE DEVICE CHECK
Date Time Interrogation Session: 20190718023540
MDC IDC PG IMPLANT DT: 20160816

## 2018-06-14 ENCOUNTER — Ambulatory Visit (INDEPENDENT_AMBULATORY_CARE_PROVIDER_SITE_OTHER): Payer: Medicare Other | Admitting: *Deleted

## 2018-06-14 DIAGNOSIS — I639 Cerebral infarction, unspecified: Secondary | ICD-10-CM

## 2018-06-14 LAB — CUP PACEART REMOTE DEVICE CHECK
Implantable Pulse Generator Implant Date: 20160816
MDC IDC SESS DTM: 20190820073930

## 2018-06-14 NOTE — Progress Notes (Signed)
[facility] Summary Report / Loop Recorder 

## 2018-06-15 DIAGNOSIS — G2581 Restless legs syndrome: Secondary | ICD-10-CM | POA: Diagnosis not present

## 2018-06-15 DIAGNOSIS — R630 Anorexia: Secondary | ICD-10-CM | POA: Diagnosis not present

## 2018-06-15 DIAGNOSIS — F331 Major depressive disorder, recurrent, moderate: Secondary | ICD-10-CM | POA: Diagnosis not present

## 2018-06-20 DIAGNOSIS — N390 Urinary tract infection, site not specified: Secondary | ICD-10-CM | POA: Diagnosis not present

## 2018-06-21 LAB — CUP PACEART REMOTE DEVICE CHECK
Implantable Pulse Generator Implant Date: 20160816
MDC IDC SESS DTM: 20190922094140

## 2018-06-22 DIAGNOSIS — M25562 Pain in left knee: Secondary | ICD-10-CM | POA: Diagnosis not present

## 2018-06-22 DIAGNOSIS — R293 Abnormal posture: Secondary | ICD-10-CM | POA: Diagnosis not present

## 2018-06-22 DIAGNOSIS — R278 Other lack of coordination: Secondary | ICD-10-CM | POA: Diagnosis not present

## 2018-06-22 DIAGNOSIS — R413 Other amnesia: Secondary | ICD-10-CM | POA: Diagnosis not present

## 2018-06-22 DIAGNOSIS — N182 Chronic kidney disease, stage 2 (mild): Secondary | ICD-10-CM | POA: Diagnosis not present

## 2018-06-22 DIAGNOSIS — M353 Polymyalgia rheumatica: Secondary | ICD-10-CM | POA: Diagnosis not present

## 2018-06-22 DIAGNOSIS — E1122 Type 2 diabetes mellitus with diabetic chronic kidney disease: Secondary | ICD-10-CM | POA: Diagnosis not present

## 2018-06-22 DIAGNOSIS — R2689 Other abnormalities of gait and mobility: Secondary | ICD-10-CM | POA: Diagnosis not present

## 2018-06-22 DIAGNOSIS — M6259 Muscle wasting and atrophy, not elsewhere classified, multiple sites: Secondary | ICD-10-CM | POA: Diagnosis not present

## 2018-06-22 DIAGNOSIS — R2681 Unsteadiness on feet: Secondary | ICD-10-CM | POA: Diagnosis not present

## 2018-06-22 DIAGNOSIS — I429 Cardiomyopathy, unspecified: Secondary | ICD-10-CM | POA: Diagnosis not present

## 2018-06-22 DIAGNOSIS — M6281 Muscle weakness (generalized): Secondary | ICD-10-CM | POA: Diagnosis not present

## 2018-06-22 DIAGNOSIS — M25511 Pain in right shoulder: Secondary | ICD-10-CM | POA: Diagnosis not present

## 2018-06-22 DIAGNOSIS — I69951 Hemiplegia and hemiparesis following unspecified cerebrovascular disease affecting right dominant side: Secondary | ICD-10-CM | POA: Diagnosis not present

## 2018-06-23 DIAGNOSIS — M353 Polymyalgia rheumatica: Secondary | ICD-10-CM | POA: Diagnosis not present

## 2018-06-23 DIAGNOSIS — E1122 Type 2 diabetes mellitus with diabetic chronic kidney disease: Secondary | ICD-10-CM | POA: Diagnosis not present

## 2018-06-23 DIAGNOSIS — R413 Other amnesia: Secondary | ICD-10-CM | POA: Diagnosis not present

## 2018-06-23 DIAGNOSIS — I69951 Hemiplegia and hemiparesis following unspecified cerebrovascular disease affecting right dominant side: Secondary | ICD-10-CM | POA: Diagnosis not present

## 2018-06-23 DIAGNOSIS — I429 Cardiomyopathy, unspecified: Secondary | ICD-10-CM | POA: Diagnosis not present

## 2018-06-23 DIAGNOSIS — M6259 Muscle wasting and atrophy, not elsewhere classified, multiple sites: Secondary | ICD-10-CM | POA: Diagnosis not present

## 2018-06-24 DIAGNOSIS — M6259 Muscle wasting and atrophy, not elsewhere classified, multiple sites: Secondary | ICD-10-CM | POA: Diagnosis not present

## 2018-06-24 DIAGNOSIS — I429 Cardiomyopathy, unspecified: Secondary | ICD-10-CM | POA: Diagnosis not present

## 2018-06-24 DIAGNOSIS — E1122 Type 2 diabetes mellitus with diabetic chronic kidney disease: Secondary | ICD-10-CM | POA: Diagnosis not present

## 2018-06-24 DIAGNOSIS — R05 Cough: Secondary | ICD-10-CM | POA: Diagnosis not present

## 2018-06-24 DIAGNOSIS — R413 Other amnesia: Secondary | ICD-10-CM | POA: Diagnosis not present

## 2018-06-24 DIAGNOSIS — M353 Polymyalgia rheumatica: Secondary | ICD-10-CM | POA: Diagnosis not present

## 2018-06-24 DIAGNOSIS — I69951 Hemiplegia and hemiparesis following unspecified cerebrovascular disease affecting right dominant side: Secondary | ICD-10-CM | POA: Diagnosis not present

## 2018-06-25 DIAGNOSIS — F331 Major depressive disorder, recurrent, moderate: Secondary | ICD-10-CM | POA: Diagnosis not present

## 2018-06-25 DIAGNOSIS — I429 Cardiomyopathy, unspecified: Secondary | ICD-10-CM | POA: Diagnosis not present

## 2018-06-25 DIAGNOSIS — R627 Adult failure to thrive: Secondary | ICD-10-CM | POA: Diagnosis not present

## 2018-06-25 DIAGNOSIS — R634 Abnormal weight loss: Secondary | ICD-10-CM | POA: Diagnosis not present

## 2018-06-25 DIAGNOSIS — M6259 Muscle wasting and atrophy, not elsewhere classified, multiple sites: Secondary | ICD-10-CM | POA: Diagnosis not present

## 2018-06-25 DIAGNOSIS — R630 Anorexia: Secondary | ICD-10-CM | POA: Diagnosis not present

## 2018-06-25 DIAGNOSIS — E1122 Type 2 diabetes mellitus with diabetic chronic kidney disease: Secondary | ICD-10-CM | POA: Diagnosis not present

## 2018-06-25 DIAGNOSIS — M353 Polymyalgia rheumatica: Secondary | ICD-10-CM | POA: Diagnosis not present

## 2018-06-25 DIAGNOSIS — I69951 Hemiplegia and hemiparesis following unspecified cerebrovascular disease affecting right dominant side: Secondary | ICD-10-CM | POA: Diagnosis not present

## 2018-06-25 DIAGNOSIS — R413 Other amnesia: Secondary | ICD-10-CM | POA: Diagnosis not present

## 2018-06-26 DIAGNOSIS — Z79899 Other long term (current) drug therapy: Secondary | ICD-10-CM | POA: Diagnosis not present

## 2018-06-28 DIAGNOSIS — I429 Cardiomyopathy, unspecified: Secondary | ICD-10-CM | POA: Diagnosis not present

## 2018-06-28 DIAGNOSIS — M353 Polymyalgia rheumatica: Secondary | ICD-10-CM | POA: Diagnosis not present

## 2018-06-28 DIAGNOSIS — I69951 Hemiplegia and hemiparesis following unspecified cerebrovascular disease affecting right dominant side: Secondary | ICD-10-CM | POA: Diagnosis not present

## 2018-06-28 DIAGNOSIS — E1122 Type 2 diabetes mellitus with diabetic chronic kidney disease: Secondary | ICD-10-CM | POA: Diagnosis not present

## 2018-06-28 DIAGNOSIS — M6259 Muscle wasting and atrophy, not elsewhere classified, multiple sites: Secondary | ICD-10-CM | POA: Diagnosis not present

## 2018-06-28 DIAGNOSIS — R413 Other amnesia: Secondary | ICD-10-CM | POA: Diagnosis not present

## 2018-06-29 ENCOUNTER — Inpatient Hospital Stay (HOSPITAL_COMMUNITY)
Admission: EM | Admit: 2018-06-29 | Discharge: 2018-07-05 | DRG: 391 | Disposition: A | Discharge status: Skilled Nursing Facility | Payer: Medicare Other | Attending: Internal Medicine | Admitting: Internal Medicine

## 2018-06-29 ENCOUNTER — Encounter (HOSPITAL_COMMUNITY): Payer: Self-pay

## 2018-06-29 ENCOUNTER — Other Ambulatory Visit: Payer: Self-pay

## 2018-06-29 ENCOUNTER — Emergency Department (HOSPITAL_COMMUNITY): Payer: Medicare Other

## 2018-06-29 DIAGNOSIS — N390 Urinary tract infection, site not specified: Secondary | ICD-10-CM | POA: Diagnosis not present

## 2018-06-29 DIAGNOSIS — K529 Noninfective gastroenteritis and colitis, unspecified: Secondary | ICD-10-CM | POA: Diagnosis not present

## 2018-06-29 DIAGNOSIS — G629 Polyneuropathy, unspecified: Secondary | ICD-10-CM | POA: Diagnosis present

## 2018-06-29 DIAGNOSIS — Z6825 Body mass index (BMI) 25.0-25.9, adult: Secondary | ICD-10-CM

## 2018-06-29 DIAGNOSIS — Z1623 Resistance to quinolones and fluoroquinolones: Secondary | ICD-10-CM | POA: Diagnosis present

## 2018-06-29 DIAGNOSIS — M6281 Muscle weakness (generalized): Secondary | ICD-10-CM | POA: Diagnosis not present

## 2018-06-29 DIAGNOSIS — R413 Other amnesia: Secondary | ICD-10-CM | POA: Diagnosis not present

## 2018-06-29 DIAGNOSIS — Z515 Encounter for palliative care: Secondary | ICD-10-CM

## 2018-06-29 DIAGNOSIS — N3001 Acute cystitis with hematuria: Secondary | ICD-10-CM

## 2018-06-29 DIAGNOSIS — Z7984 Long term (current) use of oral hypoglycemic drugs: Secondary | ICD-10-CM

## 2018-06-29 DIAGNOSIS — M25562 Pain in left knee: Secondary | ICD-10-CM | POA: Diagnosis not present

## 2018-06-29 DIAGNOSIS — I255 Ischemic cardiomyopathy: Secondary | ICD-10-CM | POA: Diagnosis present

## 2018-06-29 DIAGNOSIS — R112 Nausea with vomiting, unspecified: Secondary | ICD-10-CM | POA: Diagnosis not present

## 2018-06-29 DIAGNOSIS — Z66 Do not resuscitate: Secondary | ICD-10-CM | POA: Diagnosis not present

## 2018-06-29 DIAGNOSIS — E874 Mixed disorder of acid-base balance: Secondary | ICD-10-CM | POA: Diagnosis present

## 2018-06-29 DIAGNOSIS — Z853 Personal history of malignant neoplasm of breast: Secondary | ICD-10-CM

## 2018-06-29 DIAGNOSIS — F419 Anxiety disorder, unspecified: Secondary | ICD-10-CM | POA: Diagnosis present

## 2018-06-29 DIAGNOSIS — R278 Other lack of coordination: Secondary | ICD-10-CM | POA: Diagnosis not present

## 2018-06-29 DIAGNOSIS — I639 Cerebral infarction, unspecified: Secondary | ICD-10-CM | POA: Diagnosis present

## 2018-06-29 DIAGNOSIS — Z823 Family history of stroke: Secondary | ICD-10-CM

## 2018-06-29 DIAGNOSIS — N182 Chronic kidney disease, stage 2 (mild): Secondary | ICD-10-CM | POA: Diagnosis not present

## 2018-06-29 DIAGNOSIS — K219 Gastro-esophageal reflux disease without esophagitis: Secondary | ICD-10-CM | POA: Diagnosis present

## 2018-06-29 DIAGNOSIS — E872 Acidosis, unspecified: Secondary | ICD-10-CM | POA: Diagnosis present

## 2018-06-29 DIAGNOSIS — I429 Cardiomyopathy, unspecified: Secondary | ICD-10-CM

## 2018-06-29 DIAGNOSIS — I7389 Other specified peripheral vascular diseases: Secondary | ICD-10-CM | POA: Diagnosis present

## 2018-06-29 DIAGNOSIS — R0902 Hypoxemia: Secondary | ICD-10-CM

## 2018-06-29 DIAGNOSIS — I10 Essential (primary) hypertension: Secondary | ICD-10-CM | POA: Diagnosis present

## 2018-06-29 DIAGNOSIS — Z96612 Presence of left artificial shoulder joint: Secondary | ICD-10-CM | POA: Diagnosis present

## 2018-06-29 DIAGNOSIS — G9341 Metabolic encephalopathy: Secondary | ICD-10-CM | POA: Diagnosis present

## 2018-06-29 DIAGNOSIS — Z87891 Personal history of nicotine dependence: Secondary | ICD-10-CM

## 2018-06-29 DIAGNOSIS — Z7401 Bed confinement status: Secondary | ICD-10-CM

## 2018-06-29 DIAGNOSIS — F0151 Vascular dementia with behavioral disturbance: Secondary | ICD-10-CM | POA: Diagnosis present

## 2018-06-29 DIAGNOSIS — M1990 Unspecified osteoarthritis, unspecified site: Secondary | ICD-10-CM | POA: Diagnosis present

## 2018-06-29 DIAGNOSIS — J9601 Acute respiratory failure with hypoxia: Secondary | ICD-10-CM | POA: Diagnosis present

## 2018-06-29 DIAGNOSIS — M25511 Pain in right shoulder: Secondary | ICD-10-CM | POA: Diagnosis not present

## 2018-06-29 DIAGNOSIS — E7800 Pure hypercholesterolemia, unspecified: Secondary | ICD-10-CM | POA: Diagnosis present

## 2018-06-29 DIAGNOSIS — Z7189 Other specified counseling: Secondary | ICD-10-CM

## 2018-06-29 DIAGNOSIS — M353 Polymyalgia rheumatica: Secondary | ICD-10-CM | POA: Diagnosis not present

## 2018-06-29 DIAGNOSIS — N179 Acute kidney failure, unspecified: Secondary | ICD-10-CM | POA: Diagnosis present

## 2018-06-29 DIAGNOSIS — B9620 Unspecified Escherichia coli [E. coli] as the cause of diseases classified elsewhere: Secondary | ICD-10-CM | POA: Diagnosis present

## 2018-06-29 DIAGNOSIS — R2681 Unsteadiness on feet: Secondary | ICD-10-CM | POA: Diagnosis not present

## 2018-06-29 DIAGNOSIS — G8929 Other chronic pain: Secondary | ICD-10-CM | POA: Diagnosis present

## 2018-06-29 DIAGNOSIS — Z993 Dependence on wheelchair: Secondary | ICD-10-CM

## 2018-06-29 DIAGNOSIS — M6259 Muscle wasting and atrophy, not elsewhere classified, multiple sites: Secondary | ICD-10-CM | POA: Diagnosis not present

## 2018-06-29 DIAGNOSIS — R4182 Altered mental status, unspecified: Secondary | ICD-10-CM | POA: Diagnosis not present

## 2018-06-29 DIAGNOSIS — F01518 Vascular dementia, unspecified severity, with other behavioral disturbance: Secondary | ICD-10-CM | POA: Diagnosis present

## 2018-06-29 DIAGNOSIS — I69354 Hemiplegia and hemiparesis following cerebral infarction affecting left non-dominant side: Secondary | ICD-10-CM

## 2018-06-29 DIAGNOSIS — K8020 Calculus of gallbladder without cholecystitis without obstruction: Secondary | ICD-10-CM | POA: Diagnosis not present

## 2018-06-29 DIAGNOSIS — D649 Anemia, unspecified: Secondary | ICD-10-CM | POA: Diagnosis present

## 2018-06-29 DIAGNOSIS — R2689 Other abnormalities of gait and mobility: Secondary | ICD-10-CM | POA: Diagnosis not present

## 2018-06-29 DIAGNOSIS — E8770 Fluid overload, unspecified: Secondary | ICD-10-CM | POA: Diagnosis present

## 2018-06-29 DIAGNOSIS — F329 Major depressive disorder, single episode, unspecified: Secondary | ICD-10-CM | POA: Diagnosis present

## 2018-06-29 DIAGNOSIS — E1122 Type 2 diabetes mellitus with diabetic chronic kidney disease: Secondary | ICD-10-CM | POA: Diagnosis not present

## 2018-06-29 DIAGNOSIS — E119 Type 2 diabetes mellitus without complications: Secondary | ICD-10-CM

## 2018-06-29 DIAGNOSIS — E876 Hypokalemia: Secondary | ICD-10-CM | POA: Diagnosis present

## 2018-06-29 DIAGNOSIS — I69951 Hemiplegia and hemiparesis following unspecified cerebrovascular disease affecting right dominant side: Secondary | ICD-10-CM | POA: Diagnosis not present

## 2018-06-29 DIAGNOSIS — E11649 Type 2 diabetes mellitus with hypoglycemia without coma: Secondary | ICD-10-CM | POA: Diagnosis present

## 2018-06-29 DIAGNOSIS — E860 Dehydration: Secondary | ICD-10-CM | POA: Diagnosis present

## 2018-06-29 DIAGNOSIS — Z8249 Family history of ischemic heart disease and other diseases of the circulatory system: Secondary | ICD-10-CM

## 2018-06-29 DIAGNOSIS — R293 Abnormal posture: Secondary | ICD-10-CM | POA: Diagnosis not present

## 2018-06-29 DIAGNOSIS — R627 Adult failure to thrive: Secondary | ICD-10-CM | POA: Diagnosis present

## 2018-06-29 DIAGNOSIS — Z79899 Other long term (current) drug therapy: Secondary | ICD-10-CM

## 2018-06-29 DIAGNOSIS — M549 Dorsalgia, unspecified: Secondary | ICD-10-CM | POA: Diagnosis present

## 2018-06-29 DIAGNOSIS — E663 Overweight: Secondary | ICD-10-CM | POA: Diagnosis present

## 2018-06-29 DIAGNOSIS — Z7982 Long term (current) use of aspirin: Secondary | ICD-10-CM

## 2018-06-29 DIAGNOSIS — M79673 Pain in unspecified foot: Secondary | ICD-10-CM | POA: Diagnosis not present

## 2018-06-29 HISTORY — DX: Noninfective gastroenteritis and colitis, unspecified: K52.9

## 2018-06-29 LAB — URINALYSIS, ROUTINE W REFLEX MICROSCOPIC
Bilirubin Urine: NEGATIVE
Glucose, UA: NEGATIVE mg/dL
HGB URINE DIPSTICK: NEGATIVE
KETONES UR: NEGATIVE mg/dL
NITRITE: NEGATIVE
Protein, ur: NEGATIVE mg/dL
Specific Gravity, Urine: 1.012 (ref 1.005–1.030)
WBC, UA: >50 WBC/hpf — ABNORMAL HIGH (ref 0–5)
pH: 5 (ref 5.0–8.0)

## 2018-06-29 LAB — CBC WITH DIFFERENTIAL/PLATELET
Abs Immature Granulocytes: 0.07 10*3/uL (ref 0.00–0.07)
BASOS ABS: 0.1 10*3/uL (ref 0.0–0.1)
Basophils Relative: 1 %
EOS PCT: 2 %
Eosinophils Absolute: 0.2 10*3/uL (ref 0.0–0.5)
HEMATOCRIT: 37.2 % (ref 36.0–46.0)
Hemoglobin: 11.1 g/dL — ABNORMAL LOW (ref 12.0–15.0)
IMMATURE GRANULOCYTES: 1 %
LYMPHS ABS: 3 10*3/uL (ref 0.7–4.0)
Lymphocytes Relative: 23 %
MCH: 24.4 pg — ABNORMAL LOW (ref 26.0–34.0)
MCHC: 29.8 g/dL — AB (ref 30.0–36.0)
MCV: 81.8 fL (ref 80.0–100.0)
Monocytes Absolute: 0.9 10*3/uL (ref 0.1–1.0)
Monocytes Relative: 7 %
NRBC: 0.2 % (ref 0.0–0.2)
Neutro Abs: 8.9 10*3/uL — ABNORMAL HIGH (ref 1.7–7.7)
Neutrophils Relative %: 66 %
Platelets: 561 10*3/uL — ABNORMAL HIGH (ref 150–400)
RBC: 4.55 MIL/uL (ref 3.87–5.11)
RDW: 16.6 % — ABNORMAL HIGH (ref 11.5–15.5)
WBC: 13.2 10*3/uL — ABNORMAL HIGH (ref 4.0–10.5)

## 2018-06-29 LAB — COMPREHENSIVE METABOLIC PANEL
ALT: 7 U/L (ref 0–44)
AST: 16 U/L (ref 15–41)
Albumin: 2.7 g/dL — ABNORMAL LOW (ref 3.5–5.0)
Alkaline Phosphatase: 71 U/L (ref 38–126)
Anion gap: 12 (ref 5–15)
BUN: 23 mg/dL (ref 8–23)
CO2: 20 mmol/L — ABNORMAL LOW (ref 22–32)
CREATININE: 1.58 mg/dL — AB (ref 0.44–1.00)
Calcium: 9 mg/dL (ref 8.9–10.3)
Chloride: 100 mmol/L (ref 98–111)
GFR calc non Af Amer: 29 mL/min — ABNORMAL LOW (ref >60)
GFR, EST AFRICAN AMERICAN: 34 mL/min — AB (ref >60)
Glucose, Bld: 81 mg/dL (ref 70–99)
Potassium: 4.8 mmol/L (ref 3.5–5.1)
SODIUM: 132 mmol/L — AB (ref 135–145)
Total Bilirubin: 0.5 mg/dL (ref 0.3–1.2)
Total Protein: 7.4 g/dL (ref 6.5–8.1)

## 2018-06-29 LAB — I-STAT CG4 LACTIC ACID, ED: LACTIC ACID, VENOUS: 1.17 mmol/L (ref 0.5–1.9)

## 2018-06-29 MED ORDER — SODIUM CHLORIDE 0.9 % IV BOLUS
500.0000 mL | Freq: Once | INTRAVENOUS | Status: AC
  Administered 2018-06-30: 500 mL via INTRAVENOUS

## 2018-06-29 MED ORDER — IOHEXOL 300 MG/ML  SOLN
30.0000 mL | Freq: Once | INTRAMUSCULAR | Status: AC | PRN
  Administered 2018-06-29: 30 mL via ORAL

## 2018-06-29 NOTE — ED Notes (Signed)
Attempted two iv sticks unsuccessful at this time.

## 2018-06-29 NOTE — ED Triage Notes (Signed)
[facility] from [facility] [place] with family due to increasing Lethargy, weakness, Not eating, not drinking, n/v, weight loss x 3 weeks. Pt is currently being treated for a UTI. VSS. Has hx of CVA and has hx of right side deficits.

## 2018-06-29 NOTE — ED Notes (Signed)
Urine culture tube sent down with urine sample.   

## 2018-06-29 NOTE — ED Provider Notes (Signed)
[facility] EMERGENCY DEPARTMENT Provider Note   CSN: [id number] Arrival date & time: 06/29/18  1719     History   Chief Complaint Chief Complaint  Patient presents with  . Weakness    HPI Cathy Taylor is a 85 y.o. female.  The history is provided by the patient and a relative. No language interpreter was used.  Weakness    Cathy Taylor is a 84 y.o. female who presents to the Emergency Department complaining of weakness. She presents to the emergency department company by family for evaluation of weakness over the last three weeks. They report decreased oral intake with vomiting in the mornings as well as increase sleeping. One week ago she was started on Cipro BID for UTI. The report progressive increase in her lethargy with lack of desire to eat or drink. No fevers, chest pain, abdominal pain, diarrhea. No prior similar symptoms. She does have a history of CVA their concern for possible CVA again. Past Medical History:  Diagnosis Date  . Abdominal hernia   . Anxiety   . Arthritis   . Breast cancer ([place]) 1973   right  . 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 Bronchiectasis 11/12  . Bronchitis       . Chronic back pain greater than 3 months duration   . Depression   . GERD (gastroesophageal reflux disease)   . Heart murmur   . Hypercholesteremia   . Hypertension   . Left ventricular dysfunction 04/2015   EF 30% by TEE, + AK mid-apical anteroseptal and anterior walls plus apex, neg bubble study, grade III plaque asc Ao, suspect ICM  . Osteoarthritis of left shoulder 01/06/2012  . PMR (polymyalgia rheumatica) (HCC)   . Stress fracture of left femur with nonunion 07/08/2011  . Stroke ([place])   . Type II diabetes mellitus ([place])   . Umbilical hernia 6/38/93   unrepaired    Patient Active Problem List   Diagnosis Date Noted  . History of stroke 10/21/2017  . Hyperlipidemia 10/21/2017  . Status post placement of implantable loop recorder 04/08/2017  .  Debility   . Acute encephalopathy   . Depression 09/03/2015  . Right leg weakness 09/03/2015  . Aphasia 09/03/2015  . Nausea 09/03/2015  . Hypokalemia 09/03/2015  . Stroke (cerebrum) ([place]) 09/03/2015  . Slurred speech   . Elevated troponin 05/06/2015  . Cardiomyopathy ([place]) 05/06/2015  . Altered mental status 05/04/2015  . Vertigo 05/04/2015  . Acute ischemic stroke ([place]) 05/04/2015  . Encephalopathy acute   . CVA (cerebral vascular accident) ([place])   . Memory loss 06/01/2014  . Numbness 06/01/2014  . Osteoarthritis of left shoulder 01/06/2012  . Bronchiectasis ([place]) 10/10/2011  . Hyponatremia 09/12/2011  . Dyspnea 09/10/2011  . Hypertension 09/10/2011  . Diabetes mellitus ([place]) 09/10/2011  . PMR (polymyalgia rheumatica) ([place]) 09/10/2011  . Breast cancer ([place]) 09/10/2011    Past Surgical History:  Procedure Laterality Date  . BREAST BIOPSY  1981   left  . CATARACT EXTRACTION W/ INTRAOCULAR LENS IMPLANT  07/2011   left  . EP IMPLANTABLE DEVICE N/A 05/08/2015   Procedure: Loop Recorder Insertion;  Surgeon: Will Meredith Leeds, MD;  Location: [facility];  Service: Cardiovascular;  Laterality: N/A;  . FEMUR SURGERY  07/07/2011   left rod placed  . MASTECTOMY  1973   right  . SHOULDER ARTHROSCOPY  06/2000   left  . TEE WITHOUT CARDIOVERSION N/A 05/07/2015   Procedure: TRANSESOPHAGEAL ECHOCARDIOGRAM (TEE);  Surgeon: Larey Dresser, MD;  EF 30%, +WMA, no thrombus/PFO, suspect ICM    . TOTAL SHOULDER ARTHROPLASTY  01/06/12   left  . TOTAL SHOULDER ARTHROPLASTY  01/06/2012   Procedure: TOTAL SHOULDER ARTHROPLASTY;  Surgeon: Johnny Bridge, MD;  Location: [facility];  Service: Orthopedics;  Laterality: Left;  . TUBAL LIGATION       OB History   None      Home Medications    Prior to Admission medications   Medication Sig Start Date End Date Taking? Authorizing Provider  acetaminophen (TYLENOL) 325 MG tablet Take 650 mg by mouth every 6 (six) hours as needed (for  pain).    Yes [provider]  aspirin 81 MG tablet Take 1 tablet (81 mg total) by mouth daily. 04/07/16  Yes Belva Crome, MD  carvedilol (COREG) 6.25 MG tablet Take 1 tablet (6.25 mg total) by mouth 2 (two) times daily with a meal. 05/09/15  Yes Vann, Jessica U, DO  Cholecalciferol (VITAMIN D-3) 1000 units CAPS Take 1,000 Units by mouth daily.   Yes [provider]  donepezil (ARICEPT) 10 MG tablet Take 10 mg by mouth at bedtime.    Yes [provider]  folic acid (FOLVITE) 1 MG tablet Take 1 mg by mouth daily.   Yes [provider]  gabapentin (NEURONTIN) 100 MG capsule Take 200 mg by mouth 2 (two) times daily.  10/12/17  Yes [provider]  lisinopril (PRINIVIL,ZESTRIL) 5 MG tablet Take 1 tablet (5 mg total) by mouth daily. 05/23/15  Yes Barrett, Evelene Croon, PA-C  loratadine (CLARITIN) 10 MG tablet Take 10 mg by mouth daily.   Yes [provider]  metFORMIN (GLUCOPHAGE) 500 MG tablet Take 500 mg by mouth 2 (two) times daily. 03/09/17  Yes [provider]  mirtazapine (REMERON) 15 MG tablet Take 3.75 mg by mouth at bedtime.  03/15/17  Yes [provider]  NON FORMULARY Take 120 mLs by mouth See admin instructions. MedPass: Drink 120 ml's by mouth once a day   Yes [provider]  ondansetron (ZOFRAN) 4 MG tablet Take 4 mg by mouth every 8 (eight) hours as needed for nausea or vomiting.   Yes [provider]  rOPINIRole (REQUIP) 0.5 MG tablet Take 0.5 mg by mouth at bedtime.   Yes [provider]  sertraline (ZOLOFT) 50 MG tablet Take 150 mg by mouth at bedtime.   Yes [provider]  atorvastatin (LIPITOR) 80 MG tablet Take 1 tablet (80 mg total) by mouth daily at 6 PM. Patient not taking: Reported on 06/29/2018 05/09/15   Geradine Girt, DO  DULoxetine (CYMBALTA) 60 MG capsule Take 1 capsule (60 mg total) by mouth daily. Patient not taking: Reported on 06/29/2018 07/02/15   Garvin Fila, MD      Family History Family History  Problem Relation Age of Onset  . Heart attack Father   . Pulmonary fibrosis Sister   . Stroke Son   . Stroke Maternal Aunt     Social History Social History   Tobacco Use  . Smoking status: Never Smoker  . Smokeless tobacco: Former Systems developer    Types: Snuff  . Tobacco comment: "just tried smoking; didn't smoke to amount to nothing"  Substance Use Topics  . Alcohol use: No  . Drug use: No     Allergies   Patient has no known allergies.   Review of Systems Review of Systems  Neurological: Positive for weakness.  All other systems reviewed and are negative.    Physical Exam Updated Vital Signs BP (!) 103/55   Pulse 64   Temp 97.9 F (36.6 C) (Oral)   Resp 15   Ht 5\' 3"  (1.6 m)   Wt 62.1 kg   SpO2 100%   BMI 24.27 kg/m   Physical Exam  Constitutional: She is oriented to person, place, and time. She appears well-developed and well-nourished.  HENT:  Head: Normocephalic and atraumatic.  Cardiovascular: Normal rate and regular rhythm.  No murmur heard. Pulmonary/Chest: Effort normal and breath sounds normal. No respiratory distress.  Abdominal: Soft. There is no tenderness. There is no rebound and no guarding.  Musculoskeletal: She exhibits no edema or tenderness.  Neurological: She is alert and oriented to person, place, and time.  Five out of five strength in left upper extremity. Five out of five strength in left lower extremity. 4/5 strength in right lower extremity, RUE (family available for comparison). No facial asymmetry.  Skin: Skin is warm and dry.  Psychiatric: She has a normal mood and affect. Her behavior is normal.  Nursing note and vitals reviewed.    ED Treatments / Results  Labs (all labs ordered are listed, but only abnormal results are displayed) Labs Reviewed  COMPREHENSIVE METABOLIC PANEL - Abnormal; Notable for the following components:      Result Value   Sodium 132 (*)    CO2 20 (*)    Creatinine,  Ser 1.58 (*)    Albumin 2.7 (*)    GFR calc non Af Amer 29 (*)    GFR calc Af Amer 34 (*)    All other components within normal limits  CBC WITH DIFFERENTIAL/PLATELET - Abnormal; Notable for the following components:   WBC 13.2 (*)    Hemoglobin 11.1 (*)    MCH 24.4 (*)    MCHC 29.8 (*)    RDW 16.6 (*)    Platelets 561 (*)    Neutro Abs 8.9 (*)    All other components within normal limits  URINALYSIS, ROUTINE W REFLEX MICROSCOPIC - Abnormal; Notable for the following components:   APPearance CLOUDY (*)    Leukocytes, UA LARGE (*)    WBC, UA >50 (*)    Bacteria, UA MANY (*)    All other components within normal limits  URINE CULTURE  I-STAT CG4 LACTIC ACID, ED    EKG EKG Interpretation  Date/Time:  Tuesday June 29 2018 17:25:34 EDT Ventricular Rate:  65 PR Interval:  164 QRS Duration: 82 QT Interval:  442 QTC Calculation: 459 R Axis:   -88 Text Interpretation:  Normal sinus rhythm Low voltage QRS Left anterior fascicular block Cannot rule out Anteroseptal infarct , age undetermined Abnormal ECG No significant change since last tracing Confirmed by Quintella Reichert [phone]) on 06/29/2018 9:42:18 PM   Radiology No results found.  Procedures Procedures (including critical care time)  Medications Ordered in ED Medications  sodium chloride 0.9 % bolus 500 mL (has no administration in time range)  iohexol (OMNIPAQUE) 300 MG/ML solution 30 mL (30 mLs Oral Contrast Given 06/29/18 2256)     Initial Impression / Assessment and Plan / ED Course  I have reviewed the triage vital signs and the nursing notes.  Pertinent labs & imaging results that were available during my care of the patient were reviewed by me and considered in my medical decision making (see chart for details).     Patient here for evaluation of progressive decreased appetite, increased lethargy  as well as vomiting in the mornings. She is chronically ill appearing on examination and in no acute distress.  She does have right-sided weakness the family status due to prior stroke. Creatinine is slightly elevated compared to prior two years ago. UA is concerning for UTI, she is currently on antibiotics. Plan to obtain imaging of her brain to evaluate for subacute stroke, CT abdomen to rule out obstruction. Patient care transferred pending imaging.  Final Clinical Impressions(s) / ED Diagnoses   Final diagnoses:  None    ED Discharge Orders    None       Quintella Reichert, MD 06/30/18 [phone]

## 2018-06-30 ENCOUNTER — Encounter (HOSPITAL_COMMUNITY): Payer: Self-pay | Admitting: Internal Medicine

## 2018-06-30 ENCOUNTER — Emergency Department (HOSPITAL_COMMUNITY): Payer: Medicare Other

## 2018-06-30 ENCOUNTER — Other Ambulatory Visit: Payer: Self-pay

## 2018-06-30 DIAGNOSIS — N390 Urinary tract infection, site not specified: Secondary | ICD-10-CM | POA: Diagnosis present

## 2018-06-30 DIAGNOSIS — K8020 Calculus of gallbladder without cholecystitis without obstruction: Secondary | ICD-10-CM | POA: Diagnosis not present

## 2018-06-30 DIAGNOSIS — K529 Noninfective gastroenteritis and colitis, unspecified: Secondary | ICD-10-CM | POA: Diagnosis not present

## 2018-06-30 DIAGNOSIS — R112 Nausea with vomiting, unspecified: Secondary | ICD-10-CM | POA: Diagnosis not present

## 2018-06-30 DIAGNOSIS — Z515 Encounter for palliative care: Secondary | ICD-10-CM

## 2018-06-30 DIAGNOSIS — Z7189 Other specified counseling: Secondary | ICD-10-CM

## 2018-06-30 DIAGNOSIS — R4182 Altered mental status, unspecified: Secondary | ICD-10-CM | POA: Diagnosis not present

## 2018-06-30 HISTORY — DX: Noninfective gastroenteritis and colitis, unspecified: K52.9

## 2018-06-30 LAB — TYPE AND SCREEN
ABO/RH(D): O POS
Antibody Screen: NEGATIVE

## 2018-06-30 LAB — MAGNESIUM
Magnesium: 0.6 mg/dL — CL (ref 1.7–2.4)
Magnesium: 1.1 mg/dL — ABNORMAL LOW (ref 1.7–2.4)

## 2018-06-30 LAB — CBC
HCT: 22.1 % — ABNORMAL LOW (ref 36.0–46.0)
HCT: 31.7 % — ABNORMAL LOW (ref 36.0–46.0)
HEMATOCRIT: 31 % — AB (ref 36.0–46.0)
HEMOGLOBIN: 9.7 g/dL — AB (ref 12.0–15.0)
Hemoglobin: 6.7 g/dL — CL (ref 12.0–15.0)
Hemoglobin: 9.7 g/dL — ABNORMAL LOW (ref 12.0–15.0)
MCH: 24.9 pg — ABNORMAL LOW (ref 26.0–34.0)
MCH: 24.9 pg — AB (ref 26.0–34.0)
MCH: 25.3 pg — AB (ref 26.0–34.0)
MCHC: 30.3 g/dL (ref 30.0–36.0)
MCHC: 30.6 g/dL (ref 30.0–36.0)
MCHC: 31.3 g/dL (ref 30.0–36.0)
MCV: 82.2 fL (ref 80.0–100.0)
MCV: 81.3 fL (ref 80.0–100.0)
MCV: 80.7 fL (ref 80.0–100.0)
NRBC: 0 % (ref 0.0–0.2)
NRBC: 0.1 % (ref 0.0–0.2)
PLATELETS: 302 10*3/uL (ref 150–400)
Platelets: 435 10*3/uL — ABNORMAL HIGH (ref 150–400)
Platelets: 411 10*3/uL — ABNORMAL HIGH (ref 150–400)
RBC: 2.69 MIL/uL — ABNORMAL LOW (ref 3.87–5.11)
RBC: 3.9 MIL/uL (ref 3.87–5.11)
RBC: 3.84 MIL/uL — AB (ref 3.87–5.11)
RDW: 16.6 % — AB (ref 11.5–15.5)
RDW: 16.5 % — ABNORMAL HIGH (ref 11.5–15.5)
RDW: 16.3 % — ABNORMAL HIGH (ref 11.5–15.5)
WBC: 9.5 10*3/uL (ref 4.0–10.5)
WBC: 14.2 10*3/uL — ABNORMAL HIGH (ref 4.0–10.5)
WBC: 15 10*3/uL — ABNORMAL HIGH (ref 4.0–10.5)
nRBC: 0 % (ref 0.0–0.2)

## 2018-06-30 LAB — RETICULOCYTES
Immature Retic Fract: 26 % — ABNORMAL HIGH (ref 2.3–15.9)
RBC.: 3.84 MIL/uL — ABNORMAL LOW (ref 3.87–5.11)
Retic Count, Absolute: 43.4 10*3/uL (ref 19.0–186.0)
Retic Ct Pct: 1.1 % (ref 0.4–3.1)

## 2018-06-30 LAB — COMPREHENSIVE METABOLIC PANEL
ALT: 6 U/L (ref 0–44)
AST: 14 U/L — AB (ref 15–41)
Albumin: 2.3 g/dL — ABNORMAL LOW (ref 3.5–5.0)
Alkaline Phosphatase: 56 U/L (ref 38–126)
Anion gap: 10 (ref 5–15)
BUN: 21 mg/dL (ref 8–23)
CO2: 17 mmol/L — ABNORMAL LOW (ref 22–32)
CREATININE: 1.43 mg/dL — AB (ref 0.44–1.00)
Calcium: 7.8 mg/dL — ABNORMAL LOW (ref 8.9–10.3)
Chloride: 103 mmol/L (ref 98–111)
GFR calc Af Amer: 38 mL/min — ABNORMAL LOW (ref >60)
GFR calc non Af Amer: 33 mL/min — ABNORMAL LOW (ref >60)
GLUCOSE: 186 mg/dL — AB (ref 70–99)
POTASSIUM: 3.9 mmol/L (ref 3.5–5.1)
SODIUM: 130 mmol/L — AB (ref 135–145)
Total Bilirubin: 0.7 mg/dL (ref 0.3–1.2)
Total Protein: 6.2 g/dL — ABNORMAL LOW (ref 6.5–8.1)

## 2018-06-30 LAB — IRON AND TIBC
IRON: 22 ug/dL — AB (ref 28–170)
SATURATION RATIOS: 10 % — AB (ref 10.4–31.8)
TIBC: 231 ug/dL — AB (ref 250–450)
UIBC: 209 ug/dL

## 2018-06-30 LAB — SEDIMENTATION RATE: SED RATE: 57 mm/h — AB (ref 0–22)

## 2018-06-30 LAB — GLUCOSE, CAPILLARY
GLUCOSE-CAPILLARY: 65 mg/dL — AB (ref 70–99)
GLUCOSE-CAPILLARY: 70 mg/dL (ref 70–99)
GLUCOSE-CAPILLARY: 123 mg/dL — AB (ref 70–99)
Glucose-Capillary: 37 mg/dL — CL (ref 70–99)
Glucose-Capillary: 97 mg/dL (ref 70–99)
Glucose-Capillary: 126 mg/dL — ABNORMAL HIGH (ref 70–99)
Glucose-Capillary: 161 mg/dL — ABNORMAL HIGH (ref 70–99)

## 2018-06-30 LAB — TROPONIN I: Troponin I: <0.03 ng/mL (ref <0.03)

## 2018-06-30 LAB — TSH: TSH: 2.046 u[IU]/mL (ref 0.350–4.500)

## 2018-06-30 LAB — C DIFFICILE QUICK SCREEN W PCR REFLEX
C DIFFICILE (CDIFF) INTERP: NOT DETECTED
C DIFFICLE (CDIFF) ANTIGEN: NEGATIVE
C Diff toxin: NEGATIVE

## 2018-06-30 LAB — MRSA PCR SCREENING: MRSA BY PCR: POSITIVE — AB

## 2018-06-30 LAB — FERRITIN: Ferritin: 72 ng/mL (ref 11–307)

## 2018-06-30 LAB — VITAMIN B12: VITAMIN B 12: 346 pg/mL (ref 180–914)

## 2018-06-30 LAB — CK: CK TOTAL: 84 U/L (ref 38–234)

## 2018-06-30 LAB — FOLATE: Folate: 44.6 ng/mL (ref >5.9)

## 2018-06-30 MED ORDER — COSYNTROPIN 0.25 MG IJ SOLR
0.2500 mg | Freq: Once | INTRAMUSCULAR | Status: AC
  Administered 2018-07-01: 0.25 mg via INTRAVENOUS
  Filled 2018-06-30: qty 0.25

## 2018-06-30 MED ORDER — MIRTAZAPINE 7.5 MG PO TABS
3.7500 mg | ORAL_TABLET | Freq: Every day | ORAL | Status: DC
  Administered 2018-06-30 – 2018-07-04 (×5): 3.75 mg via ORAL
  Filled 2018-06-30 (×6): qty 1

## 2018-06-30 MED ORDER — ONDANSETRON HCL 4 MG/2ML IJ SOLN: 4.0000 mg | Freq: Four times a day (QID) | INTRAMUSCULAR | Status: DC | PRN

## 2018-06-30 MED ORDER — GABAPENTIN 100 MG PO CAPS
200.0000 mg | ORAL_CAPSULE | Freq: Two times a day (BID) | ORAL | Status: DC
  Administered 2018-06-30 – 2018-07-01 (×3): 200 mg via ORAL
  Filled 2018-06-30 (×3): qty 2

## 2018-06-30 MED ORDER — CARVEDILOL 6.25 MG PO TABS
6.2500 mg | ORAL_TABLET | Freq: Two times a day (BID) | ORAL | Status: DC
  Administered 2018-06-30 – 2018-07-05 (×10): 6.25 mg via ORAL
  Filled 2018-06-30 (×12): qty 1

## 2018-06-30 MED ORDER — MUPIROCIN 2 % EX OINT
1.0000 "application " | TOPICAL_OINTMENT | Freq: Two times a day (BID) | CUTANEOUS | Status: AC
  Administered 2018-06-30 – 2018-07-04 (×9): 1 via NASAL
  Filled 2018-06-30 (×3): qty 22

## 2018-06-30 MED ORDER — ACETAMINOPHEN 650 MG RE SUPP: 650.0000 mg | Freq: Four times a day (QID) | RECTAL | Status: DC | PRN

## 2018-06-30 MED ORDER — INSULIN ASPART 100 UNIT/ML ~~LOC~~ SOLN: 3.0000 [IU] | Freq: Three times a day (TID) | SUBCUTANEOUS | Status: DC

## 2018-06-30 MED ORDER — CHLORHEXIDINE GLUCONATE CLOTH 2 % EX PADS
6.0000 | MEDICATED_PAD | Freq: Every day | CUTANEOUS | Status: AC
  Administered 2018-07-01 – 2018-07-04 (×4): 6 via TOPICAL

## 2018-06-30 MED ORDER — INSULIN ASPART 100 UNIT/ML ~~LOC~~ SOLN
0.0000 [IU] | Freq: Three times a day (TID) | SUBCUTANEOUS | Status: DC
  Administered 2018-07-01: 9 [IU] via SUBCUTANEOUS

## 2018-06-30 MED ORDER — VITAMIN D 1000 UNITS PO TABS
1000.0000 [IU] | ORAL_TABLET | Freq: Every day | ORAL | Status: DC
  Administered 2018-06-30 – 2018-07-05 (×6): 1000 [IU] via ORAL
  Filled 2018-06-30 (×6): qty 1

## 2018-06-30 MED ORDER — INSULIN ASPART 100 UNIT/ML ~~LOC~~ SOLN
0.0000 [IU] | Freq: Three times a day (TID) | SUBCUTANEOUS | Status: DC
  Administered 2018-06-30: 2 [IU] via SUBCUTANEOUS

## 2018-06-30 MED ORDER — ONDANSETRON HCL 4 MG/2ML IJ SOLN
4.0000 mg | Freq: Once | INTRAMUSCULAR | Status: AC
  Administered 2018-06-30: 4 mg via INTRAVENOUS
  Filled 2018-06-30: qty 2

## 2018-06-30 MED ORDER — PIPERACILLIN-TAZOBACTAM 3.375 G IVPB 30 MIN
3.3750 g | Freq: Once | INTRAVENOUS | Status: AC
  Administered 2018-06-30: 3.375 g via INTRAVENOUS
  Filled 2018-06-30: qty 50

## 2018-06-30 MED ORDER — SERTRALINE HCL 50 MG PO TABS
150.0000 mg | ORAL_TABLET | Freq: Every day | ORAL | Status: DC
  Administered 2018-06-30 – 2018-07-03 (×4): 150 mg via ORAL
  Filled 2018-06-30 (×5): qty 1

## 2018-06-30 MED ORDER — SODIUM CHLORIDE 0.9 % IV SOLN
INTRAVENOUS | Status: DC
  Administered 2018-06-30: 06:00:00 via INTRAVENOUS

## 2018-06-30 MED ORDER — SODIUM CHLORIDE 0.9 % IV SOLN
INTRAVENOUS | Status: DC | PRN
  Administered 2018-06-30 – 2018-07-02 (×2): 250 mL via INTRAVENOUS

## 2018-06-30 MED ORDER — ACETAMINOPHEN 325 MG PO TABS: 650.0000 mg | ORAL_TABLET | Freq: Four times a day (QID) | ORAL | Status: DC | PRN

## 2018-06-30 MED ORDER — DONEPEZIL HCL 10 MG PO TABS
10.0000 mg | ORAL_TABLET | Freq: Every day | ORAL | Status: DC
  Administered 2018-06-30 – 2018-07-04 (×5): 10 mg via ORAL
  Filled 2018-06-30 (×5): qty 1

## 2018-06-30 MED ORDER — LORATADINE 10 MG PO TABS
10.0000 mg | ORAL_TABLET | Freq: Every day | ORAL | Status: DC
  Administered 2018-06-30 – 2018-07-05 (×6): 10 mg via ORAL
  Filled 2018-06-30 (×6): qty 1

## 2018-06-30 MED ORDER — LISINOPRIL 5 MG PO TABS: 5.0000 mg | ORAL_TABLET | Freq: Every day | ORAL | Status: DC

## 2018-06-30 MED ORDER — FOLIC ACID 1 MG PO TABS
1.0000 mg | ORAL_TABLET | Freq: Every day | ORAL | Status: DC
  Administered 2018-06-30 – 2018-07-05 (×6): 1 mg via ORAL
  Filled 2018-06-30 (×6): qty 1

## 2018-06-30 MED ORDER — SODIUM CHLORIDE 0.9 % IV BOLUS
1000.0000 mL | Freq: Once | INTRAVENOUS | Status: AC
  Administered 2018-06-30: 1000 mL via INTRAVENOUS

## 2018-06-30 MED ORDER — ASPIRIN 81 MG PO CHEW
81.0000 mg | CHEWABLE_TABLET | Freq: Every day | ORAL | Status: DC
  Administered 2018-06-30 – 2018-07-05 (×6): 81 mg via ORAL
  Filled 2018-06-30 (×6): qty 1

## 2018-06-30 MED ORDER — ENOXAPARIN SODIUM 30 MG/0.3ML ~~LOC~~ SOLN
30.0000 mg | SUBCUTANEOUS | Status: DC
  Administered 2018-06-30 – 2018-07-01 (×2): 30 mg via SUBCUTANEOUS
  Filled 2018-06-30 (×2): qty 0.3

## 2018-06-30 MED ORDER — DEXTROSE-NACL 5-0.9 % IV SOLN
INTRAVENOUS | Status: DC
  Administered 2018-06-30 (×2): via INTRAVENOUS

## 2018-06-30 MED ORDER — MAGNESIUM SULFATE 2 GM/50ML IV SOLN
2.0000 g | Freq: Once | INTRAVENOUS | Status: AC
  Administered 2018-06-30: 2 g via INTRAVENOUS
  Filled 2018-06-30: qty 50

## 2018-06-30 MED ORDER — DEXTROSE 50 % IV SOLN
INTRAVENOUS | Status: AC
  Administered 2018-06-30: 25 mL
  Filled 2018-06-30: qty 50

## 2018-06-30 MED ORDER — ROPINIROLE HCL 0.5 MG PO TABS
0.5000 mg | ORAL_TABLET | Freq: Every day | ORAL | Status: DC
  Administered 2018-06-30: 0.5 mg via ORAL
  Filled 2018-06-30: qty 1

## 2018-06-30 MED ORDER — SODIUM CHLORIDE 0.9 % IV SOLN: INTRAVENOUS | Status: DC

## 2018-06-30 MED ORDER — PIPERACILLIN-TAZOBACTAM 3.375 G IVPB
3.3750 g | Freq: Three times a day (TID) | INTRAVENOUS | Status: DC
  Administered 2018-06-30 – 2018-07-02 (×7): 3.375 g via INTRAVENOUS
  Filled 2018-06-30 (×6): qty 50

## 2018-06-30 MED ORDER — ONDANSETRON HCL 4 MG PO TABS: 4.0000 mg | ORAL_TABLET | Freq: Four times a day (QID) | ORAL | Status: DC | PRN

## 2018-06-30 NOTE — Progress Notes (Signed)
Pharmacy Antibiotic Note  Tressy Kunzman is a 84 y.o. female admitted on 06/29/2018 with enteritis.  Pharmacy has been consulted for Zosyn dosing.  Plan: Zosyn 3.375g IV q8h (4-hour infusion).  Height: 5\' 3"  (160 cm) Weight: 137 lb (62.1 kg) IBW/kg (Calculated) : 52.4  Temp (24hrs), Avg:97.7 F (36.5 C), Min:97.5 F (36.4 C), Max:97.9 F (36.6 C)  Recent Labs  Lab 06/29/18 1747 06/29/18 1751  WBC  --  13.2*  CREATININE  --  1.58*  LATICACIDVEN 1.17  --     Estimated Creatinine Clearance: 22.3 mL/min (A) (by C-G formula based on SCr of 1.58 mg/dL (H)).    No Known Allergies   Thank you for allowing pharmacy to be a part of this patient's care.  Wynona Neat, PharmD, BCPS  06/30/2018 4:52 AM

## 2018-06-30 NOTE — H&P (Addendum)
History and Physical    Cathy Taylor UUV:[id number] DOB: Jul 15, 1933 DOA: 06/29/2018  PCP: Maryella Shivers, MD  Patient coming from: Skilled nursing facility.  History obtained from patient's daughter and son-in-law.  Chief Complaint: Weakness and nausea vomiting.  HPI: Cathy Taylor is a 84 y.o. female with history of ischemic cardiomyopathy last EF measured in August 2019 was 60 to 65% with grade 1 diastolic dysfunction, diabetes mellitus type 2, history of polymyalgia rheumatica presently not on medication, hyperlipidemia and dementia has not been eating well for last 1 month.  Has been a gradual decline with poor oral intake.  Last week patient was diagnosed with urinary tract infection alert was placed on Cipro but patient was not able to take due to poor oral intake and also started developing nausea vomiting.  Has not complained of any abdominal pain no chest pain or shortness of breath no no fever or chills.  Patient's family also noticed that patient became more lethargic and confused was concern for stroke.  Per the family patient is bedbound due to CVA with right-sided weakness.  ED Course: In the ER CT head did not show anything acute CT abdomen pelvis done shows possible enteritis.  While in the ER patient had large loose stool.  Labs reveal acute renal failure.  UA is compatible with UTI and was placed on Zosyn for enteritis and UTI.  Was given fluid bolus in the ER and admitted for further hydration and management of poor oral intake and UTI and enteritis.  Review of Systems: As per HPI, rest all negative.   Past Medical History:  Diagnosis Date  . Abdominal hernia   . Anxiety   . Arthritis   . Breast cancer ([place]) 1973   right  . 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 Bronchiectasis 11/12  . Bronchitis       . Chronic back pain greater than 3 months duration   . Depression   . GERD (gastroesophageal reflux disease)   . Heart murmur   . Hypercholesteremia   . Hypertension    . Left ventricular dysfunction 04/2015   EF 30% by TEE, + AK mid-apical anteroseptal and anterior walls plus apex, neg bubble study, grade III plaque asc Ao, suspect ICM  . Osteoarthritis of left shoulder 01/06/2012  . PMR (polymyalgia rheumatica) (HCC)   . Stress fracture of left femur with nonunion 07/08/2011  . Stroke ([place])   . Type II diabetes mellitus ([place])   . Umbilical hernia 4/74/25   unrepaired    Past Surgical History:  Procedure Laterality Date  . BREAST BIOPSY  1981   left  . CATARACT EXTRACTION W/ INTRAOCULAR LENS IMPLANT  07/2011   left  . EP IMPLANTABLE DEVICE N/A 05/08/2015   Procedure: Loop Recorder Insertion;  Surgeon: Will Meredith Leeds, MD;  Location: [facility];  Service: Cardiovascular;  Laterality: N/A;  . FEMUR SURGERY  07/07/2011   left rod placed  . MASTECTOMY  1973   right  . SHOULDER ARTHROSCOPY  06/2000   left  . TEE WITHOUT CARDIOVERSION N/A 05/07/2015   Procedure: TRANSESOPHAGEAL ECHOCARDIOGRAM (TEE);  Surgeon: Larey Dresser, MD;  EF 30%, +WMA, no thrombus/PFO, suspect ICM    . TOTAL SHOULDER ARTHROPLASTY  01/06/12   left  . TOTAL SHOULDER ARTHROPLASTY  01/06/2012   Procedure: TOTAL SHOULDER ARTHROPLASTY;  Surgeon: Johnny Bridge, MD;  Location: [facility];  Service: Orthopedics;  Laterality: Left;  . TUBAL LIGATION  reports that she has never smoked. She quit smokeless tobacco use about 69 years ago.  Her smokeless tobacco use included snuff. She reports that she does not drink alcohol or use drugs.  No Known Allergies  Family History  Problem Relation Age of Onset  . Heart attack Father   . Pulmonary fibrosis Sister   . Stroke Son   . Stroke Maternal Aunt     Prior to Admission medications   Medication Sig Start Date End Date Taking? Authorizing Provider  acetaminophen (TYLENOL) 325 MG tablet Take 650 mg by mouth every 6 (six) hours as needed (for pain).    Yes [provider]  aspirin 81 MG tablet Take 1 tablet (81  mg total) by mouth daily. 04/07/16  Yes Belva Crome, MD  carvedilol (COREG) 6.25 MG tablet Take 1 tablet (6.25 mg total) by mouth 2 (two) times daily with a meal. 05/09/15  Yes Vann, Jessica U, DO  Cholecalciferol (VITAMIN D-3) 1000 units CAPS Take 1,000 Units by mouth daily.   Yes [provider]  donepezil (ARICEPT) 10 MG tablet Take 10 mg by mouth at bedtime.    Yes [provider]  folic acid (FOLVITE) 1 MG tablet Take 1 mg by mouth daily.   Yes [provider]  gabapentin (NEURONTIN) 100 MG capsule Take 200 mg by mouth 2 (two) times daily.  10/12/17  Yes [provider]  lisinopril (PRINIVIL,ZESTRIL) 5 MG tablet Take 1 tablet (5 mg total) by mouth daily. 05/23/15  Yes Barrett, Evelene Croon, PA-C  loratadine (CLARITIN) 10 MG tablet Take 10 mg by mouth daily.   Yes [provider]  metFORMIN (GLUCOPHAGE) 500 MG tablet Take 500 mg by mouth 2 (two) times daily. 03/09/17  Yes [provider]  mirtazapine (REMERON) 15 MG tablet Take 3.75 mg by mouth at bedtime.  03/15/17  Yes [provider]  NON FORMULARY Take 120 mLs by mouth See admin instructions. MedPass: Drink 120 ml's by mouth once a day   Yes [provider]  ondansetron (ZOFRAN) 4 MG tablet Take 4 mg by mouth every 8 (eight) hours as needed for nausea or vomiting.   Yes [provider]  rOPINIRole (REQUIP) 0.5 MG tablet Take 0.5 mg by mouth at bedtime.   Yes [provider]  sertraline (ZOLOFT) 50 MG tablet Take 150 mg by mouth at bedtime.   Yes [provider]  atorvastatin (LIPITOR) 80 MG tablet Take 1 tablet (80 mg total) by mouth daily at 6 PM. Patient not taking: Reported on 06/29/2018 05/09/15   Geradine Girt, DO  DULoxetine (CYMBALTA) 60 MG capsule Take 1 capsule (60 mg total) by mouth daily. Patient not taking: Reported on 06/29/2018 07/02/15   Garvin Fila, MD    Physical Exam: Vitals:   06/29/18 2200 06/29/18 2215 06/29/18 2230  06/29/18 2315  BP: (!) 120/54 119/73 (!) 103/55 127/83  Pulse: 64 64 64 (!) 57  Resp: 14 15 15 16   Temp:      TempSrc:      SpO2: 100% 100% 100% 100%  Weight:      Height:          Constitutional: Moderately built and nourished. Vitals:   06/29/18 2200 06/29/18 2215 06/29/18 2230 06/29/18 2315  BP: (!) 120/54 119/73 (!) 103/55 127/83  Pulse: 64 64 64 (!) 57  Resp: 14 15 15 16   Temp:      TempSrc:      SpO2: 100% 100%  100% 100%  Weight:      Height:       Eyes: Anicteric no pallor. ENMT: No discharge from the ears eyes nose or mouth. Neck: No mass felt.  No neck rigidity. Respiratory: No rhonchi or crepitations. Cardiovascular: S1-S2 heard no murmurs appreciated. Abdomen: Soft nontender bowel sounds present. Musculoskeletal: No edema.  No joint effusion. Skin: No rash. Neurologic: Alert awake oriented to name and place.  Has right-sided weakness from previous stroke. Psychiatric: Mildly confused.   Labs on Admission: I have personally reviewed following labs and imaging studies  CBC: Recent Labs  Lab 06/29/18 1751  WBC 13.2*  NEUTROABS 8.9*  HGB 11.1*  HCT 37.2  MCV 81.8  PLT 062*   Basic Metabolic Panel: Recent Labs  Lab 06/29/18 1751  NA 132*  K 4.8  CL 100  CO2 20*  GLUCOSE 81  BUN 23  CREATININE 1.58*  CALCIUM 9.0   GFR: Estimated Creatinine Clearance: 22.3 mL/min (A) (by C-G formula based on SCr of 1.58 mg/dL (H)). Liver Function Tests: Recent Labs  Lab 06/29/18 1751  AST 16  ALT 7  ALKPHOS 71  BILITOT 0.5  PROT 7.4  ALBUMIN 2.7*   No results for input(s): LIPASE, AMYLASE in the last 168 hours. No results for input(s): AMMONIA in the last 168 hours. Coagulation Profile: No results for input(s): INR, PROTIME in the last 168 hours. Cardiac Enzymes: No results for input(s): CKTOTAL, CKMB, CKMBINDEX, TROPONINI in the last 168 hours. BNP (last 3 results) No results for input(s): PROBNP in the last 8760 hours. HbA1C: No results for  input(s): HGBA1C in the last 72 hours. CBG: No results for input(s): GLUCAP in the last 168 hours. Lipid Profile: No results for input(s): CHOL, HDL, LDLCALC, TRIG, CHOLHDL, LDLDIRECT in the last 72 hours. Thyroid Function Tests: No results for input(s): TSH, T4TOTAL, FREET4, T3FREE, THYROIDAB in the last 72 hours. Anemia Panel: No results for input(s): VITAMINB12, FOLATE, FERRITIN, TIBC, IRON, RETICCTPCT in the last 72 hours. Urine analysis:    Component Value Date/Time   COLORURINE YELLOW 06/29/2018 1911   APPEARANCEUR CLOUDY (A) 06/29/2018 1911   LABSPEC 1.012 06/29/2018 1911   PHURINE 5.0 06/29/2018 1911   GLUCOSEU NEGATIVE 06/29/2018 1911   HGBUR NEGATIVE 06/29/2018 1911   BILIRUBINUR NEGATIVE 06/29/2018 1911   KETONESUR NEGATIVE 06/29/2018 1911   PROTEINUR NEGATIVE 06/29/2018 1911   UROBILINOGEN 1.0 05/04/2015 1535   NITRITE NEGATIVE 06/29/2018 1911   LEUKOCYTESUR LARGE (A) 06/29/2018 1911   Sepsis Labs: @LABRCNTIP (procalcitonin:4,lacticidven:4) )No results found for this or any previous visit (from the past 240 hour(s)).   Radiological Exams on Admission: Ct Abdomen Pelvis Wo Contrast  Result Date: 06/30/2018 CLINICAL DATA:  Lethargy, weakness, not eating or drinking, nausea and vomiting, weight loss for 3 weeks. Being treated for urinary tract infection. EXAM: CT ABDOMEN AND PELVIS WITHOUT CONTRAST TECHNIQUE: Multidetector CT imaging of the abdomen and pelvis was performed following the standard protocol without IV contrast. COMPARISON:  None. FINDINGS: Lower chest: Mild fibrosis in the lung bases. Small esophageal hiatal hernia. Coronary artery calcifications. Hepatobiliary: Stones and sludge in the gallbladder. No gallbladder wall thickening or infiltration. No bile duct dilatation. No focal liver lesions. Pancreas: Unremarkable. No pancreatic ductal dilatation or surrounding inflammatory changes. Spleen: Normal in size without focal abnormality. Adrenals/Urinary Tract:  Adrenal glands are unremarkable. Kidneys are normal, without renal calculi, focal lesion, or hydronephrosis. Bladder is unremarkable. Stomach/Bowel: Stomach, small bowel, and colon are not abnormally distended. Contrast material flows through to the  colon without evidence of obstruction. There is evidence of some bowel wall and fold thickening in the distal ileum which could indicate enteritis, either infectious or inflammatory. Focal thickening of the cecal wall is demonstrated without contrast opacification in otherwise opacified cecum. A cecal mass is suspected. Vascular/Lymphatic: Extensive vascular calcifications. No aortic aneurysm. No significant lymphadenopathy. Prominent calcifications in the abdominal aorta at the hiatus above the level of the celiac axis likely indicates significant focal stenosis. Reproductive: Uterus and bilateral adnexa are unremarkable. Other: No free air or free fluid in the abdomen. Abdominal wall musculature appears intact. Musculoskeletal: Degenerative changes in the lumbar spine. No destructive bone lesions. IMPRESSION: 1. Bowel wall thickening in the terminal ileum suggesting enteritis which could be infectious or inflammatory. No obstruction. 2. Focal thickening of the cecal wall suspicious for a cecal mass. Consider colonoscopy in follow-up. 3. Cholelithiasis without evidence of cholecystitis. 4. Extensive vascular calcifications. Probable calcific stenosis of the proximal abdominal aorta. Electronically Signed   By: Lucienne Capers M.D.   On: 06/30/2018 01:33   Ct Head Wo Contrast  Result Date: 06/30/2018 CLINICAL DATA:  Altered mental status. Decreased appetite. Weakness and weight loss. EXAM: CT HEAD WITHOUT CONTRAST TECHNIQUE: Contiguous axial images were obtained from the base of the skull through the vertex without intravenous contrast. COMPARISON:  Head CT 09/03/2015 FINDINGS: Brain: There is no mass, hemorrhage or extra-axial collection. There is generalized  atrophy without lobar predilection. Old left basal ganglia and right cerebellar infarcts. There is hypoattenuation of the periventricular white matter, most commonly indicating chronic ischemic microangiopathy. Vascular: Atherosclerotic calcification of the internal carotid arteries at the skull base. No abnormal hyperdensity of the major intracranial arteries or dural venous sinuses. Skull: The visualized skull base, calvarium and extracranial soft tissues are normal. Sinuses/Orbits: Partial opacification of the left mastoid air cells. No paranasal sinus fluid levels or mucosal thickening. The orbits are normal. IMPRESSION: 1. No acute abnormality. 2. Old right cerebellar infarct and findings of severe chronic ischemic microangiopathy, which have progressed since 09/03/2015. 3. Generalized atrophy. Electronically Signed   By: Ulyses Jarred M.D.   On: 06/30/2018 01:26    EKG: Independently reviewed.  Normal sinus rhythm.  Assessment/Plan Principal Problem:   Nausea & vomiting Active Problems:   Hypertension   Diabetes mellitus (HCC)   PMR (polymyalgia rheumatica) (HCC)   Memory loss   CVA (cerebral vascular accident) ([place])   Cardiomyopathy ([place])   Status post placement of implantable loop recorder   Acute lower UTI   Enteritis    1. Nausea vomiting diarrhea with poor oral intake and failure to thrive with renal failure -patient's UA is compatible with UTI and CAT scan shows enteritis for which patient has been placed on Zosyn and will continue with gentle hydration we will get swallow evaluation.  Since patient had one large bowel movement which was diarrhea we will check stool studies. 2. Acute renal failure likely from poor oral intake.  Will hold lisinopril.  Continue with hydration. 3. UTI see #1. 4. Enteritis see #1. 5. History of polymyalgia rheumatica presently not on any medications we will check sed rate and CK levels. 6. Hypertension on Coreg which we will continue for now but will  hold lisinopril due to low normal blood pressure and acute renal failure.  May have to hold Coreg if patient blood pressure still is in the low normal. 7. History of diabetes mellitus type 2 -hold metformin and keep patient on sliding scale coverage. 8. Dementia on Aricept. 9.  History of cardiomyopathy last EF measured was 60 to 35% on August 2019.  Presently appears compensated and dehydrated.  Receiving fluids. 10. History of depression on Cymbalta and mirtazapine. 11. History of neuropathy on gabapentin which may has to be held if creatinine worsens.   DVT prophylaxis: Lovenox. Code Status: DNR. Family Communication: Patient's daughter and son-in-law. Disposition Plan: Skilled nursing facility. Consults called: Speech therapist evaluation and palliative care. Admission status: Observation.   Rise Patience MD [facility] Pager [phone].  If 7PM-7AM, please contact night-coverage [email or web address] Password [email or web address]  06/30/2018, 2:49 AM

## 2018-06-30 NOTE — Progress Notes (Signed)
  Speech Language Pathology Treatment:    Patient Details Name: Cathy Taylor MRN: [id number] DOB: 05-16-1933 Today's Date: 06/30/2018 Time: 2992-4268 SLP Time Calculation (min) (ACUTE ONLY): 19 min  Assessment / Plan / Recommendation Clinical Impression  Although pt was difficult to arouse, once awakened she was pleasant and fully participated in bedside evaluation. Daughter reported no history of pneumonia or dysphagia. Oral motor examination revealed slightly delayed/slow facial movements, however pt presented with facial symmetry, lingual and labial ROM WFL. Following oral care, pt accepted thin, puree, and regular texure POs with one incidence of wet vocal quality and immediate throat clear following deglutition of thin (first presentation). Although prolonged mastication noted with regular, pt's swallow fxn proved to be [facility] and no further s/s aspiration were observed throughout evaluation. Pt educated to assume upright position while eating, minimize environmental distractions, and take small bites and sips as an added precaution. Given insignificant history and mild aspiration risk when pt is fully alert, recommend regular diet, thin liquids, straws allowed, and intermittent supervision to ensure pt maintains alertness during all PO intake. No further ST in recommended at this time.    HPI HPI: Cathy Taylor is a 84 y.o. female with hx of ischemic cardiomyopath, CVA, GERD, bronchitis, diabetes mellitus type 2, hyperlipidemia, heart murmur, left ventricular dysfunction, dementia, and per MD note, has exhibited gradual decline in PO intake over last month. She [facility] ED 10/8 with lethargy, weakness, emesis, and has refused PO intake since admission to hospital. Head CT revealed no acute abnormality; generalized atrophy; old right cerebellar infarct and findings of severe chronic ischemic microangiopathy, which have progressed since 09/03/2015.      SLP Plan          Recommendations   Medication Administration: Whole meds with liquid Compensations: Minimize environmental distractions;Small sips/bites                Oral Care Recommendations: Oral care BID Follow up Recommendations: None SLP Visit Diagnosis: Dysphagia, unspecified (R13.10)       Jettie Booze, Student SLP                 Jettie Booze 06/30/2018, 10:24 AM

## 2018-06-30 NOTE — Progress Notes (Signed)
CBG on admission 0402 37, pt is asymptomatic. Refused to take anything PO. Given 55ml of D50, CBG went up to 97. Notified Tylene Fantasia.

## 2018-06-30 NOTE — Progress Notes (Signed)
Have to redraw sample for this AM lab works. Will ff/up result.

## 2018-06-30 NOTE — Consult Note (Signed)
Consultation Note Date: 06/30/2018   Patient Name: Cathy Taylor  DOB: [birth year]  MRN: [id number]  Age / Sex: 84 y.o., female  PCP: Maryella Shivers, MD Referring Physician: Kinnie Feil, MD  Reason for Consultation: Establishing goals of care  HPI/Patient Profile: 84 y.o. female  with past medical history of CVA (2016), DM2, mild dementia, ischemic cardiomyopathy with EF 60-65%, hx polymyalgia rheumatica (not on medication) admitted on 06/29/2018 with UTI, nausea, vomiting. Labs reveal acute kidney injury. CT scan abdomen and pelvis shows possible enteritis, possible cecal mass. Has been evaluated by SLP with no significant risk for aspiration. She was anemic on admission and required blood transfusion. She had asymptomatic hypoglycemic event. This is patient's first admission this year. Palliative medicine consulted for [place].  Clinical Assessment and Goals of Care: Met with patient and her daughter, [name] tells me there are other family members who would need to be present for full Palliative Consult.  [name] was not aware of Palliative consult being placed, and was unaware of reason for consult. Introduced Palliative medicine specialized medical care for people living with serious illness. It focuses on providing relief from the symptoms and stress of a serious illness. The goal is to improve quality of life for both the patient and the family. [name] stated she was on break from lunch and did not have much time.  [name] and patient state their [place] is for patient to receive antibiotics and return to SNF.  Patient has DNR status and has advanced directives on chart indicating she would not want life prolonging measures in the event that she had an incurable illness. [name] lives at a SNF in [place]. She is nonambulatory for over a year- uses a wheelchair. Dependent for ADL's. Tells me she can't really recall  enjoying much. Her granddaughter works at the SNF where she resides and that is why they have here there. Her husband has advanced Parkinson's and also lives there. She would like to return there at discharge. Discussed patient's poor appetite. Patient and patient's daughter feel this is likely related to her "stomach infection" and are hopeful for appetite to return after infection is treated. [name] states she hasn't eaten much since her CVA in 2016.  Patient was awake, alert, oriented to person and place. Very pleasant. Denied pain, or nausea. Stated she hopes to return to nursing facility after she completes her hospitalization.  I offered [name] my contact information and encouraged her to contact me with a time when her sisters were available for further consult.   Primary Decision Maker HCPOA- [name], [name], [name]    SUMMARY OF RECOMMENDATIONS -DNR  -Continue full scope care -Contact info given to [facility] with offer to meet with her and her sisters for full consult -GOC are to stabilize and D/C to previous residence, they are hopeful patient's appetite will improve with treatment of enteritis -Recommend referral to outpatient Palliative at discharge -PMT will shadow and intervene if further discussion regarding medical treatments are necessary  Code Status/Advance Care Planning:  DNR  Additional Recommendations (Limitations, Scope, Preferences):  Full Scope Treatment  Prognosis:    Unable to determine  Discharge Planning: [place] for rehab with Palliative care service follow-up  Primary Diagnoses: Present on Admission: . Nausea & vomiting . Status post placement of implantable loop recorder . PMR (polymyalgia rheumatica) (HCC) . Hypertension . Memory loss . CVA (cerebral vascular accident) ([place]) . Acute lower UTI . Enteritis   I have reviewed the medical record, interviewed the patient and family, and examined the patient. The following  aspects are pertinent.  Past Medical History:  Diagnosis Date  . Abdominal hernia   . Anxiety   . Arthritis   . Breast cancer ([place]) 1973   right  . 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 Bronchiectasis 11/12  . Bronchitis       . Chronic back pain greater than 3 months duration   . Depression   . GERD (gastroesophageal reflux disease)   . Heart murmur   . Hypercholesteremia   . Hypertension   . Left ventricular dysfunction 04/2015   EF 30% by TEE, + AK mid-apical anteroseptal and anterior walls plus apex, neg bubble study, grade III plaque asc Ao, suspect ICM  . Osteoarthritis of left shoulder 01/06/2012  . PMR (polymyalgia rheumatica) (HCC)   . Stress fracture of left femur with nonunion 07/08/2011  . Stroke ([place])   . Type II diabetes mellitus ([place])   . Umbilical hernia 2/68/34   unrepaired   Social History   Socioeconomic History  . Marital status: Married    Spouse name: Not on file  . Number of children: Not on file  . Years of education: Not on file  . Highest education level: Not on file  Occupational History  . Not on file  Social Needs  . Financial resource strain: Not on file  . Food insecurity:    Worry: Not on file    Inability: Not on file  . Transportation needs:    Medical: Not on file    Non-medical: Not on file  Tobacco Use  . Smoking status: Never Smoker  . Smokeless tobacco: Former Systems developer    Types: Snuff  . Tobacco comment: "just tried smoking; didn't smoke to amount to nothing"  Substance and Sexual Activity  . Alcohol use: No  . Drug use: No  . Sexual activity: Not Currently  Lifestyle  . Physical activity:    Days per week: Not on file    Minutes per session: Not on file  . Stress: Not on file  Relationships  . Social connections:    Talks on phone: Not on file    Gets together: Not on file    Attends religious service: Not on file    Active member of club or organization: Not on file    Attends meetings of clubs or organizations: Not  on file    Relationship status: Not on file  Other Topics Concern  . Not on file  Social History Narrative   Currently lives with her husband who has Parkinson's Disease and Dementia. Their children live nearby. They have home health caregivers who assist them throughout the week as well.   Family History  Problem Relation Age of Onset  . Heart attack Father   . Pulmonary fibrosis Sister   . Stroke Son   . Stroke Maternal Aunt    Scheduled Meds: . aspirin  81 mg Oral Daily  .  carvedilol  6.25 mg Oral BID WC  . Chlorhexidine Gluconate Cloth  6 each Topical Q0600  . cholecalciferol  1,000 Units Oral Daily  . [START ON 07/01/2018] cosyntropin  0.25 mg Intravenous Once  . donepezil  10 mg Oral QHS  . enoxaparin (LOVENOX) injection  30 mg Subcutaneous Q24H  . folic acid  1 mg Oral Daily  . gabapentin  200 mg Oral BID  . insulin aspart  0-9 Units Subcutaneous TID WC  . insulin aspart  0-9 Units Subcutaneous TID WC  . loratadine  10 mg Oral Daily  . mirtazapine  3.75 mg Oral QHS  . mupirocin ointment  1 application Nasal BID  . rOPINIRole  0.5 mg Oral QHS  . sertraline  150 mg Oral QHS   Continuous Infusions: . dextrose 5 % and 0.9% NaCl 100 mL/hr at 06/30/18 1118  . magnesium sulfate 1 - 4 g bolus IVPB    . piperacillin-tazobactam (ZOSYN)  IV 3.375 g (06/30/18 1101)   PRN Meds:.acetaminophen **OR** acetaminophen, ondansetron **OR** ondansetron (ZOFRAN) IV Medications Prior to Admission:  Prior to Admission medications   Medication Sig Start Date End Date Taking? Authorizing Provider  acetaminophen (TYLENOL) 325 MG tablet Take 650 mg by mouth every 6 (six) hours as needed (for pain).    Yes [provider]  aspirin 81 MG tablet Take 1 tablet (81 mg total) by mouth daily. 04/07/16  Yes Belva Crome, MD  carvedilol (COREG) 6.25 MG tablet Take 1 tablet (6.25 mg total) by mouth 2 (two) times daily with a meal. 05/09/15  Yes Vann, Jessica U, DO  Cholecalciferol (VITAMIN D-3)  1000 units CAPS Take 1,000 Units by mouth daily.   Yes [provider]  donepezil (ARICEPT) 10 MG tablet Take 10 mg by mouth at bedtime.    Yes [provider]  folic acid (FOLVITE) 1 MG tablet Take 1 mg by mouth daily.   Yes [provider]  gabapentin (NEURONTIN) 100 MG capsule Take 200 mg by mouth 2 (two) times daily.  10/12/17  Yes [provider]  lisinopril (PRINIVIL,ZESTRIL) 5 MG tablet Take 1 tablet (5 mg total) by mouth daily. 05/23/15  Yes Barrett, Evelene Croon, PA-C  loratadine (CLARITIN) 10 MG tablet Take 10 mg by mouth daily.   Yes [provider]  metFORMIN (GLUCOPHAGE) 500 MG tablet Take 500 mg by mouth 2 (two) times daily. 03/09/17  Yes [provider]  mirtazapine (REMERON) 15 MG tablet Take 3.75 mg by mouth at bedtime.  03/15/17  Yes [provider]  NON FORMULARY Take 120 mLs by mouth See admin instructions. MedPass: Drink 120 ml's by mouth once a day   Yes [provider]  ondansetron (ZOFRAN) 4 MG tablet Take 4 mg by mouth every 8 (eight) hours as needed for nausea or vomiting.   Yes [provider]  rOPINIRole (REQUIP) 0.5 MG tablet Take 0.5 mg by mouth at bedtime.   Yes [provider]  sertraline (ZOLOFT) 50 MG tablet Take 150 mg by mouth at bedtime.   Yes [provider]  atorvastatin (LIPITOR) 80 MG tablet Take 1 tablet (80 mg total) by mouth daily at 6 PM. Patient not taking: Reported on 06/29/2018 05/09/15   Geradine Girt, DO  DULoxetine (CYMBALTA) 60 MG capsule Take 1 capsule (60 mg total) by mouth daily. Patient not taking: Reported on 06/29/2018 07/02/15   Garvin Fila, MD   No Known Allergies Review of Systems  Physical Exam  Constitutional: She appears well-developed and well-nourished.  Cardiovascular: Normal rate and regular rhythm.  Pulmonary/Chest: Effort normal.  Skin: Skin is warm and dry. There is pallor.  Nursing note and vitals reviewed.   Vital Signs: BP  94/72 (BP Location: Right Arm)   Pulse (!) 57   Temp (!) 97.5 F (36.4 C) (Oral)   Resp 18   Ht '5\' 3"'  (1.6 m)   Wt 64.2 kg   SpO2 98%   BMI 25.07 kg/m  Pain Scale: 0-10   Pain Score: 0-No pain   SpO2: SpO2: 98 % O2 Device:SpO2: 98 % O2 Flow Rate: .   IO: Intake/output summary:   Intake/Output Summary (Last 24 hours) at 06/30/2018 1231 Last data filed at 06/30/2018 0500 Gross per 24 hour  Intake 1493.77 ml  Output -  Net 1493.77 ml    LBM: Last BM Date: 06/30/18 Baseline Weight: Weight: 62.1 kg Most recent weight: Weight: 64.2 kg     Palliative Assessment/Data: PPS: 20%     Thank you for this consult. Palliative medicine will continue to follow and assist as needed.   Time In: 1155 Time Out: 1305 Time Total: 70 minutes Greater than 50%  of this time was spent counseling and coordinating care related to the above assessment and plan.  Signed by: Mariana Kaufman, AGNP-C Palliative Medicine    Please contact Palliative Medicine Team phone at [phone] for questions and concerns.  For individual provider: See Shea Evans

## 2018-06-30 NOTE — Progress Notes (Signed)
   06/30/18 1500  Clinical Encounter Type  Visited With Patient  Visit Type Initial  Referral From Social work   Introduced self to pt, pt was polite and did not seem inclined to converse today.  Let her know chaplains are available and she can ask if she desires a chaplain to return.  Myra Gianotti resident, [phone]

## 2018-06-30 NOTE — Care Management Note (Signed)
Case Management Note  Patient Details  Name: Cathy Taylor MRN: [id number] Date of Birth: September 17, 1933  Subjective/Objective:              Obs N/V      Action/Plan:  Anticipate return to [facility] at [place].  Expected Discharge Date:  07/03/18               Expected Discharge Plan:  Skilled Nursing Facility  In-House Referral:  Clinical Social Work  Discharge planning Services  CM Consult  Post Acute Care Choice:    Choice offered to:     DME Arranged:    DME Agency:     HH Arranged:    [place] Agency:     Status of Service:  Completed, signed off  If discussed at [employer], dates discussed:    Additional Comments:  Carles Collet, RN 06/30/2018, 2:39 PM

## 2018-06-30 NOTE — ED Provider Notes (Signed)
I assumed care of this patient from Dr. Ayesha Rumpf at [phone].  Please see their note for further details of Hx, PE.  Briefly patient is a 84 y.o. female who presented with lethargy and emesis.  Currently being treated for urinary tract infection with Cipro.  Lives in a skilled facility.  Work-up notable for leukocytosis and persistent urinary tract infection.  Urine culture sent.  Current plan is to follow-up on CT head to rule out additional strokes and abdomen to rule out small bowel obstruction.  CT head revealed old CVAs, ICH or evidence of recent strokes.  CT abdomen negative for small bowel obstruction but did reveal evidence of enteritis.  And possible cecal mass.  Given the fact the patient still has urinary tract infection despite antibiotics, in addition to evidence of enteritis, will admit patient for IV antibiotics.  Discussed case with Dr. Hal Hope who will admit the patient for further management.     Fatima Blank, MD 06/30/18 [phone]

## 2018-06-30 NOTE — Progress Notes (Signed)
Patient is seen and examined. Please see today's H&P for the details. 84 y.o. female with history of ischemic cardiomyopathy last EF measured in August 2019 was 60 to 65% with grade 1 diastolic dysfunction, diabetes mellitus type 2, history of polymyalgia rheumatica presently not on medication, hyperlipidemia and dementia has not been eating well for last 1 month, admitted with enteritis, electrolytes abnormalities, acute renal insufficiency, failure to thrive. Cont iv fluid hydration, replace electrolytes, iv antibiotic treatment for urinary infection, enteritis, pend labs, cultures, r/o c diff. Obtain stim tests AM. D/c scheduled mealtime insulin due to hypoglycemia. Monitor on ISS D/w patient, her family, RN Kinnie Feil

## 2018-06-30 NOTE — NC FL2 (Signed)
[place] MEDICAID FL2 LEVEL OF CARE SCREENING TOOL     IDENTIFICATION  Patient Name: Cathy Taylor Birthdate: [birth year] Sex: female Admission Date (Current Location): 06/29/2018  [place] and [place] Number:  Cathy Taylor [id number] [place] and Address:  [facility] [place] [address] [place], [place] [address]      Provider Number: [id number]  Attending Physician Name and Address:  Kinnie Feil, MD  Relative Name and Phone Number:  [name]; daughter; [phone]    Current Level of Care: Hospital Recommended Level of Care: [place] Prior Approval Number:    Date Approved/Denied:   PASRR Number: [id number]  Discharge Plan: SNF    Current Diagnoses: Patient Active Problem List   Diagnosis Date Noted  . Nausea & vomiting 06/30/2018  . Acute lower UTI 06/30/2018  . Enteritis 06/30/2018  . Advanced care planning/counseling discussion   . Goals of care, counseling/discussion   . Palliative care by specialist   . History of stroke 10/21/2017  . Hyperlipidemia 10/21/2017  . Status post placement of implantable loop recorder 04/08/2017  . Debility   . Acute encephalopathy   . Depression 09/03/2015  . Right leg weakness 09/03/2015  . Aphasia 09/03/2015  . Nausea 09/03/2015  . Hypokalemia 09/03/2015  . Stroke (cerebrum) ([place]) 09/03/2015  . Slurred speech   . Elevated troponin 05/06/2015  . Cardiomyopathy ([place]) 05/06/2015  . Altered mental status 05/04/2015  . Vertigo 05/04/2015  . Acute ischemic stroke ([place]) 05/04/2015  . Encephalopathy acute   . CVA (cerebral vascular accident) ([place])   . Memory loss 06/01/2014  . Numbness 06/01/2014  . Osteoarthritis of left shoulder 01/06/2012  . Bronchiectasis ([place]) 10/10/2011  . Hyponatremia 09/12/2011  . Dyspnea 09/10/2011  . Hypertension 09/10/2011  . Diabetes mellitus ([place]) 09/10/2011  . PMR (polymyalgia rheumatica) ([place]) 09/10/2011  . Breast cancer ([place]) 09/10/2011     Orientation RESPIRATION BLADDER Height & Weight     Self, Time, Situation, Place  Normal Continent, External catheter Weight: 141 lb 8.6 oz (64.2 kg) Height:  5\' 3"  (160 cm)  BEHAVIORAL SYMPTOMS/MOOD NEUROLOGICAL BOWEL NUTRITION STATUS      Continent Diet(see discharge summary)  AMBULATORY STATUS COMMUNICATION OF NEEDS Skin   Extensive Assist Verbally Other (Comment)(skin tear on anus with barrier cream)                       Personal Care Assistance Level of Assistance  Bathing, Feeding, Dressing Bathing Assistance: Maximum assistance Feeding assistance: Limited assistance Dressing Assistance: Maximum assistance     Functional Limitations Info  Speech, Hearing, Sight Sight Info: Adequate Hearing Info: Adequate Speech Info: Adequate    SPECIAL CARE FACTORS FREQUENCY                       Contractures Contractures Info: Not present    Additional Factors Info  Code Status, Allergies, Psychotropic, Insulin Sliding Scale Code Status Info: DNR Allergies Info: No Known Allergies Psychotropic Info: donepezil (ARICEPT) tablet 10 mg daily at bedtime; mirtazapine (REMERON) tablet 3.75 mg daily at bedtime; sertraline (ZOLOFT) tablet 150 mg daily at bedtime Insulin Sliding Scale Info: insulin aspart (novoLOG) injection 0-9 Units 3x daily with meals       Current Medications (06/30/2018):  This is the current hospital active medication list Current Facility-Administered Medications  Medication Dose Route Frequency Provider Last Rate Last Dose  . acetaminophen (TYLENOL) tablet 650 mg  650 mg Oral Q6H PRN Rise Patience,  MD       Or  . acetaminophen (TYLENOL) suppository 650 mg  650 mg Rectal Q6H PRN Rise Patience, MD      . aspirin chewable tablet 81 mg  81 mg Oral Daily Rise Patience, MD   81 mg at 06/30/18 1057  . carvedilol (COREG) tablet 6.25 mg  6.25 mg Oral BID WC Rise Patience, MD   6.25 mg at 06/30/18 1057  . Chlorhexidine Gluconate  Cloth 2 % PADS 6 each  6 each Topical Q0600 Buriev, Arie Sabina, MD      . cholecalciferol (VITAMIN D) tablet 1,000 Units  1,000 Units Oral Daily Rise Patience, MD   1,000 Units at 06/30/18 1057  . [START ON 07/01/2018] cosyntropin (CORTROSYN) injection 0.25 mg  0.25 mg Intravenous Once Buriev, Ulugbek N, MD      . dextrose 5 %-0.9 % sodium chloride infusion   Intravenous Continuous Kinnie Feil, MD 100 mL/hr at 06/30/18 1118    . donepezil (ARICEPT) tablet 10 mg  10 mg Oral QHS Rise Patience, MD      . enoxaparin (LOVENOX) injection 30 mg  30 mg Subcutaneous Q24H Rise Patience, MD      . folic acid (FOLVITE) tablet 1 mg  1 mg Oral Daily Rise Patience, MD   1 mg at 06/30/18 1057  . gabapentin (NEURONTIN) capsule 200 mg  200 mg Oral BID Rise Patience, MD   200 mg at 06/30/18 1057  . insulin aspart (novoLOG) injection 0-9 Units  0-9 Units Subcutaneous TID WC Rise Patience, MD      . insulin aspart (novoLOG) injection 0-9 Units  0-9 Units Subcutaneous TID WC Rise Patience, MD      . loratadine (CLARITIN) tablet 10 mg  10 mg Oral Daily Rise Patience, MD   10 mg at 06/30/18 1057  . magnesium sulfate IVPB 2 g 50 mL  2 g Intravenous Once Buriev, Arie Sabina, MD      . mirtazapine (REMERON) tablet 3.75 mg  3.75 mg Oral QHS Rise Patience, MD      . mupirocin ointment (BACTROBAN) 2 % 1 application  1 application Nasal BID Kinnie Feil, MD      . ondansetron (ZOFRAN) tablet 4 mg  4 mg Oral Q6H PRN Rise Patience, MD       Or  . ondansetron [facility]) injection 4 mg  4 mg Intravenous Q6H PRN Rise Patience, MD      . piperacillin-tazobactam (ZOSYN) IVPB 3.375 g  3.375 g Intravenous Q8H Bryk, Veronda P, RPH 12.5 mL/hr at 06/30/18 1101 3.375 g at 06/30/18 1101  . rOPINIRole (REQUIP) tablet 0.5 mg  0.5 mg Oral QHS Rise Patience, MD      . sertraline (ZOLOFT) tablet 150 mg  150 mg Oral QHS Rise Patience, MD          Discharge Medications: Please see discharge summary for a list of discharge medications.  Relevant Imaging Results:  Relevant Lab Results:   Additional Information SS#[id number] [place], [place]

## 2018-07-01 DIAGNOSIS — E872 Acidosis: Secondary | ICD-10-CM

## 2018-07-01 DIAGNOSIS — B9620 Unspecified Escherichia coli [E. coli] as the cause of diseases classified elsewhere: Secondary | ICD-10-CM | POA: Diagnosis present

## 2018-07-01 DIAGNOSIS — I7389 Other specified peripheral vascular diseases: Secondary | ICD-10-CM | POA: Diagnosis present

## 2018-07-01 DIAGNOSIS — R112 Nausea with vomiting, unspecified: Secondary | ICD-10-CM | POA: Diagnosis not present

## 2018-07-01 DIAGNOSIS — M353 Polymyalgia rheumatica: Secondary | ICD-10-CM | POA: Diagnosis present

## 2018-07-01 DIAGNOSIS — M6281 Muscle weakness (generalized): Secondary | ICD-10-CM | POA: Diagnosis not present

## 2018-07-01 DIAGNOSIS — E1122 Type 2 diabetes mellitus with diabetic chronic kidney disease: Secondary | ICD-10-CM | POA: Diagnosis not present

## 2018-07-01 DIAGNOSIS — F05 Delirium due to known physiological condition: Secondary | ICD-10-CM | POA: Diagnosis not present

## 2018-07-01 DIAGNOSIS — M79673 Pain in unspecified foot: Secondary | ICD-10-CM | POA: Diagnosis not present

## 2018-07-01 DIAGNOSIS — K529 Noninfective gastroenteritis and colitis, unspecified: Secondary | ICD-10-CM | POA: Diagnosis present

## 2018-07-01 DIAGNOSIS — R4701 Aphasia: Secondary | ICD-10-CM | POA: Diagnosis not present

## 2018-07-01 DIAGNOSIS — Z1623 Resistance to quinolones and fluoroquinolones: Secondary | ICD-10-CM | POA: Diagnosis present

## 2018-07-01 DIAGNOSIS — D649 Anemia, unspecified: Secondary | ICD-10-CM | POA: Diagnosis present

## 2018-07-01 DIAGNOSIS — E7800 Pure hypercholesterolemia, unspecified: Secondary | ICD-10-CM | POA: Diagnosis present

## 2018-07-01 DIAGNOSIS — N390 Urinary tract infection, site not specified: Secondary | ICD-10-CM | POA: Diagnosis present

## 2018-07-01 DIAGNOSIS — R918 Other nonspecific abnormal finding of lung field: Secondary | ICD-10-CM | POA: Diagnosis not present

## 2018-07-01 DIAGNOSIS — E11649 Type 2 diabetes mellitus with hypoglycemia without coma: Secondary | ICD-10-CM | POA: Diagnosis present

## 2018-07-01 DIAGNOSIS — F0151 Vascular dementia with behavioral disturbance: Secondary | ICD-10-CM | POA: Diagnosis present

## 2018-07-01 DIAGNOSIS — R2681 Unsteadiness on feet: Secondary | ICD-10-CM | POA: Diagnosis not present

## 2018-07-01 DIAGNOSIS — I69951 Hemiplegia and hemiparesis following unspecified cerebrovascular disease affecting right dominant side: Secondary | ICD-10-CM | POA: Diagnosis not present

## 2018-07-01 DIAGNOSIS — E876 Hypokalemia: Secondary | ICD-10-CM | POA: Diagnosis present

## 2018-07-01 DIAGNOSIS — R2689 Other abnormalities of gait and mobility: Secondary | ICD-10-CM | POA: Diagnosis not present

## 2018-07-01 DIAGNOSIS — Z741 Need for assistance with personal care: Secondary | ICD-10-CM | POA: Diagnosis not present

## 2018-07-01 DIAGNOSIS — N179 Acute kidney failure, unspecified: Secondary | ICD-10-CM | POA: Diagnosis present

## 2018-07-01 DIAGNOSIS — K559 Vascular disorder of intestine, unspecified: Secondary | ICD-10-CM | POA: Diagnosis not present

## 2018-07-01 DIAGNOSIS — L89620 Pressure ulcer of left heel, unstageable: Secondary | ICD-10-CM | POA: Diagnosis not present

## 2018-07-01 DIAGNOSIS — I69354 Hemiplegia and hemiparesis following cerebral infarction affecting left non-dominant side: Secondary | ICD-10-CM | POA: Diagnosis not present

## 2018-07-01 DIAGNOSIS — I10 Essential (primary) hypertension: Secondary | ICD-10-CM | POA: Diagnosis present

## 2018-07-01 DIAGNOSIS — E8770 Fluid overload, unspecified: Secondary | ICD-10-CM | POA: Diagnosis present

## 2018-07-01 DIAGNOSIS — F0391 Unspecified dementia with behavioral disturbance: Secondary | ICD-10-CM | POA: Diagnosis not present

## 2018-07-01 DIAGNOSIS — E663 Overweight: Secondary | ICD-10-CM | POA: Diagnosis present

## 2018-07-01 DIAGNOSIS — L89610 Pressure ulcer of right heel, unstageable: Secondary | ICD-10-CM | POA: Diagnosis not present

## 2018-07-01 DIAGNOSIS — I255 Ischemic cardiomyopathy: Secondary | ICD-10-CM | POA: Diagnosis present

## 2018-07-01 DIAGNOSIS — G8191 Hemiplegia, unspecified affecting right dominant side: Secondary | ICD-10-CM | POA: Diagnosis not present

## 2018-07-01 DIAGNOSIS — R278 Other lack of coordination: Secondary | ICD-10-CM | POA: Diagnosis not present

## 2018-07-01 DIAGNOSIS — E874 Mixed disorder of acid-base balance: Secondary | ICD-10-CM | POA: Diagnosis present

## 2018-07-01 DIAGNOSIS — L89890 Pressure ulcer of other site, unstageable: Secondary | ICD-10-CM | POA: Diagnosis not present

## 2018-07-01 DIAGNOSIS — G9341 Metabolic encephalopathy: Secondary | ICD-10-CM | POA: Diagnosis present

## 2018-07-01 DIAGNOSIS — F339 Major depressive disorder, recurrent, unspecified: Secondary | ICD-10-CM | POA: Diagnosis not present

## 2018-07-01 DIAGNOSIS — Z66 Do not resuscitate: Secondary | ICD-10-CM | POA: Diagnosis present

## 2018-07-01 DIAGNOSIS — J9601 Acute respiratory failure with hypoxia: Secondary | ICD-10-CM | POA: Diagnosis present

## 2018-07-01 DIAGNOSIS — Z6825 Body mass index (BMI) 25.0-25.9, adult: Secondary | ICD-10-CM | POA: Diagnosis not present

## 2018-07-01 LAB — COMPREHENSIVE METABOLIC PANEL
ALK PHOS: 45 U/L (ref 38–126)
ALT: 7 U/L (ref 0–44)
ANION GAP: 5 (ref 5–15)
AST: 12 U/L — ABNORMAL LOW (ref 15–41)
Albumin: 1.8 g/dL — ABNORMAL LOW (ref 3.5–5.0)
BILIRUBIN TOTAL: 0.3 mg/dL (ref 0.3–1.2)
BUN: 10 mg/dL (ref 8–23)
CALCIUM: 6.9 mg/dL — AB (ref 8.9–10.3)
CO2: 16 mmol/L — ABNORMAL LOW (ref 22–32)
Chloride: 119 mmol/L — ABNORMAL HIGH (ref 98–111)
Creatinine, Ser: 1.18 mg/dL — ABNORMAL HIGH (ref 0.44–1.00)
GFR calc non Af Amer: 41 mL/min — ABNORMAL LOW (ref >60)
GFR, EST AFRICAN AMERICAN: 48 mL/min — AB (ref >60)
Glucose, Bld: 665 mg/dL (ref 70–99)
POTASSIUM: 3.4 mmol/L — AB (ref 3.5–5.1)
SODIUM: 140 mmol/L (ref 135–145)
Total Protein: 4.7 g/dL — ABNORMAL LOW (ref 6.5–8.1)

## 2018-07-01 LAB — GLUCOSE, CAPILLARY
GLUCOSE-CAPILLARY: 102 mg/dL — AB (ref 70–99)
GLUCOSE-CAPILLARY: 191 mg/dL — AB (ref 70–99)
Glucose-Capillary: 210 mg/dL — ABNORMAL HIGH (ref 70–99)
Glucose-Capillary: 42 mg/dL — CL (ref 70–99)
Glucose-Capillary: 97 mg/dL (ref 70–99)

## 2018-07-01 LAB — ACTH STIMULATION, 3 TIME POINTS
Cortisol, 30 Min: 20.6 ug/dL
Cortisol, 60 Min: 34.1 ug/dL
Cortisol, Base: 17.2 ug/dL

## 2018-07-01 LAB — CBC
HCT: 30 % — ABNORMAL LOW (ref 36.0–46.0)
HEMOGLOBIN: 8.9 g/dL — AB (ref 12.0–15.0)
MCH: 24.3 pg — AB (ref 26.0–34.0)
MCHC: 29.7 g/dL — ABNORMAL LOW (ref 30.0–36.0)
MCV: 81.7 fL (ref 80.0–100.0)
PLATELETS: 398 10*3/uL (ref 150–400)
RBC: 3.67 MIL/uL — AB (ref 3.87–5.11)
RDW: 16.7 % — ABNORMAL HIGH (ref 11.5–15.5)
WBC: 12.9 10*3/uL — AB (ref 4.0–10.5)
nRBC: 0.3 % — ABNORMAL HIGH (ref 0.0–0.2)

## 2018-07-01 LAB — BLOOD GAS, ARTERIAL
Acid-base deficit: 7.8 mmol/L — ABNORMAL HIGH (ref 0.0–2.0)
BICARBONATE: 19.2 mmol/L — AB (ref 20.0–28.0)
Drawn by: 448981
O2 CONTENT: 3 L/min
O2 SAT: 98.1 %
PATIENT TEMPERATURE: 98.2
PCO2 ART: 52.4 mmHg — AB (ref 32.0–48.0)
PO2 ART: 147 mmHg — AB (ref 83.0–108.0)
pH, Arterial: 7.187 — CL (ref 7.350–7.450)

## 2018-07-01 LAB — MAGNESIUM: Magnesium: 1.4 mg/dL — ABNORMAL LOW (ref 1.7–2.4)

## 2018-07-01 LAB — LACTIC ACID, PLASMA: LACTIC ACID, VENOUS: 1.5 mmol/L (ref 0.5–1.9)

## 2018-07-01 MED ORDER — WHITE PETROLATUM EX OINT
TOPICAL_OINTMENT | CUTANEOUS | Status: AC
  Administered 2018-07-01: 0.2
  Filled 2018-07-01: qty 28.35

## 2018-07-01 MED ORDER — MAGNESIUM SULFATE 2 GM/50ML IV SOLN
2.0000 g | Freq: Once | INTRAVENOUS | Status: AC
  Administered 2018-07-01: 2 g via INTRAVENOUS
  Filled 2018-07-01: qty 50

## 2018-07-01 MED ORDER — DEXTROSE 50 % IV SOLN
50.0000 mL | Freq: Once | INTRAVENOUS | Status: AC
  Administered 2018-07-01: 50 mL via INTRAVENOUS

## 2018-07-01 MED ORDER — SODIUM CHLORIDE 0.9 % IV SOLN
INTRAVENOUS | Status: DC
  Administered 2018-07-01: 1 mL via INTRAVENOUS

## 2018-07-01 MED ORDER — SODIUM CHLORIDE 4 MEQ/ML IV SOLN
INTRAVENOUS | Status: DC
  Administered 2018-07-01: 21:00:00 via INTRAVENOUS
  Filled 2018-07-01: qty 1000

## 2018-07-01 MED ORDER — DEXTROSE 50 % IV SOLN
INTRAVENOUS | Status: AC
  Administered 2018-07-01: 50 mL
  Filled 2018-07-01: qty 50

## 2018-07-01 MED ORDER — INSULIN ASPART 100 UNIT/ML ~~LOC~~ SOLN
8.0000 [IU] | Freq: Once | SUBCUTANEOUS | Status: AC
  Administered 2018-07-01: 8 [IU] via SUBCUTANEOUS

## 2018-07-01 MED ORDER — STERILE WATER FOR INJECTION IV SOLN
INTRAVENOUS | Status: DC
  Administered 2018-07-01: 18:00:00 via INTRAVENOUS
  Filled 2018-07-01 (×3): qty 9.71

## 2018-07-01 MED ORDER — SODIUM CHLORIDE 4 MEQ/ML IV SOLN
INTRAVENOUS | Status: DC
  Administered 2018-07-01 – 2018-07-02 (×2): via INTRAVENOUS
  Filled 2018-07-01 (×2): qty 950

## 2018-07-01 MED ORDER — SODIUM BICARBONATE 8.4 % IV SOLN: INTRAVENOUS | Status: DC

## 2018-07-01 NOTE — Progress Notes (Signed)
Pt cbg was 42 at 1739, arousable, given d50-50 per protocol and repeat cbg 97, paged Dr. [place] Pink waiting for orders.

## 2018-07-01 NOTE — Progress Notes (Addendum)
PROGRESS NOTE  Cathy Taylor FIE:[id number] DOB: [birth year] DOA: 06/29/2018 PCP: Maryella Shivers, MD  HPI/Recap of past 53 hours: 84 year old female comes from a skilled nursing facility with past history of mild dementia status post CVA, polymyalgia rheumatica, diastolic dysfunction and brought in for worsening confusion over the past month.  Patient quite somnolent and found to have large UTI as well as signs consistent with ischemic versus infectious colitis plus acute kidney injury.  Started on IV Zosyn and IV fluids.  Overnight no issues.  Patient remains quite somnolent, only awakens briefly and then goes back to sleep.  Lab work today notes improvement in renal function, white blood cell count and electrolyte abnormalities.    Assessment/Plan: Principal Problem:   Nausea & vomiting Active Problems:   Hypertension   Diabetes mellitus ([place]): Came in with hypoglycemia, likely due to infection and poor p.o. intake.  Initially on dextrose drip.  Received steroid stim test this morning which led to CBG of greater than 600.  Stopped dextrose and IV fluids given NovoLog coverage only which then led to repeat episode of hypoglycemia.  For now, continue to monitor.  Start p.o. when she is more awake   Still with some persistent hypoglycemia, likely in regards to her infection.  Have added D5 to her bicarb drip.   PMR (polymyalgia rheumatica) (HCC)   Memory loss   CVA (cerebral vascular accident) [facility]): Left-sided hemiplegia, wheelchair-bound   Cardiomyopathy [facility]): Monitoring input and output.   Status post placement of implantable loop recorder   Acute lower UTI: Awaiting urine cultures.  Continue IV Zosyn Colitis: Ischemic versus infectious.  Continue IV Zosyn. Senile dementia, mild with acute behavioral disturbance/acute metabolic encephalopathy: Secondary to infection and metabolic acidosis.  Should improve somewhat as we treat underlying issues.  Try to minimize sleep during the day and  make sure that she is not agitated and awake during the night.  Have stopped sedating medications including Remeron and Neurontin for now Overweight: Patient meets criteria with BMI greater than 25 Metabolic/respiratory acidosis: Given patient's somnolence, ABG checked.  Found to have pH of 7.18 with PCO2 of 52 and PO2 of 147 with bicarb of 19.  Signs consistent with a mild respiratory acidosis and non-anion gap metabolic acidosis.  Suspect the latter may be from diarrhea brought on by colitis.  Have added bicarb drip    Code Status: DNR  Family Communication: Daughter and son-in-law at the bedside  Disposition Plan: Continue supplemental care.  Return back to skilled nursing once more awake, infection treated   Consultants:  None  Procedures:  None  Antimicrobials:  IV Zosyn 10/9-present  DVT prophylaxis: Lovenox   Objective: Vitals:   07/01/18 0637 07/01/18 1358  BP: (!) 106/51 (!) 90/44  Pulse: 71 (!) 51  Resp: 16 14  Temp: 98.7 F (37.1 C) 98.2 F (36.8 C)  SpO2: 96% 90%    Intake/Output Summary (Last 24 hours) at 07/01/2018 1820 Last data filed at 07/01/2018 0700 Gross per 24 hour  Intake 2128.23 ml  Output 1400 ml  Net 728.23 ml   Filed Weights   06/29/18 1737 06/30/18 0500  Weight: 62.1 kg 64.2 kg   Body mass index is 25.07 kg/m.  Exam:   General: Somnolent, no acute distress  HEENT: Normocephalic and atraumatic, mucous memories are slightly dry  Neck: Supple, no JVD  Cardiovascular: Regular rate and rhythm, S1-S2  Respiratory: Clear to auscultation bilaterally  Abdomen: Soft, nondistended, hypoactive bowel sounds  Musculoskeletal: No clubbing or cyanosis,  1+ pitting edema  Skin: No skin breaks, tears or lesions  Psychiatry: Somnolent  Neuro: Right-sided hemiplegia, difficult to assess given somnolence   Data Reviewed: CBC: Recent Labs  Lab 06/29/18 1751 06/30/18 0401 06/30/18 0533 06/30/18 0730 07/01/18 1117  WBC 13.2*  9.5 14.2* 15.0* 12.9*  NEUTROABS 8.9*  --   --   --   --   HGB 11.1* 6.7* 9.7* 9.7* 8.9*  HCT 37.2 22.1* 31.7* 31.0* 30.0*  MCV 81.8 82.2 81.3 80.7 81.7  PLT 561* 302 435* 411* 850   Basic Metabolic Panel: Recent Labs  Lab 06/29/18 1751 06/30/18 0401 06/30/18 0533 06/30/18 0715 07/01/18 0633  NA 132*  --  130*  --  140  K 4.8  --  3.9  --  3.4*  CL 100  --  103  --  119*  CO2 20*  --  17*  --  16*  GLUCOSE 81  --  186*  --  665*  BUN 23  --  21  --  10  CREATININE 1.58*  --  1.43*  --  1.18*  CALCIUM 9.0  --  7.8*  --  6.9*  MG  --  0.6* REORDERING, ADD ON SAMPLE WAS QUESTIONABLE RESULTS FROM EARLIER 1.1* 1.4*   GFR: Estimated Creatinine Clearance: 32.6 mL/min (A) (by C-G formula based on SCr of 1.18 mg/dL (H)). Liver Function Tests: Recent Labs  Lab 06/29/18 1751 06/30/18 0533 07/01/18 0633  AST 16 14* 12*  ALT 7 6 7   ALKPHOS 71 56 45  BILITOT 0.5 0.7 0.3  PROT 7.4 6.2* 4.7*  ALBUMIN 2.7* 2.3* 1.8*   No results for input(s): LIPASE, AMYLASE in the last 168 hours. No results for input(s): AMMONIA in the last 168 hours. Coagulation Profile: No results for input(s): INR, PROTIME in the last 168 hours. Cardiac Enzymes: Recent Labs  Lab 06/30/18 0730  CKTOTAL 38  TROPONINI <0.03   BNP (last 3 results) No results for input(s): PROBNP in the last 8760 hours. HbA1C: No results for input(s): HGBA1C in the last 72 hours. CBG: Recent Labs  Lab 07/01/18 1047 07/01/18 1349 07/01/18 1401 07/01/18 1739 07/01/18 1806  GLUCAP 102* 16* 210* 42* 97   Lipid Profile: No results for input(s): CHOL, HDL, LDLCALC, TRIG, CHOLHDL, LDLDIRECT in the last 72 hours. Thyroid Function Tests: Recent Labs    06/30/18 0401  TSH 2.046   Anemia Panel: Recent Labs    06/30/18 0730  VITAMINB12 346  FOLATE 44.6  FERRITIN 72  TIBC 231*  IRON 22*  RETICCTPCT 1.1   Urine analysis:    Component Value Date/Time   COLORURINE YELLOW 06/29/2018 1911   APPEARANCEUR CLOUDY (A)  06/29/2018 1911   LABSPEC 1.012 06/29/2018 1911   PHURINE 5.0 06/29/2018 1911   GLUCOSEU NEGATIVE 06/29/2018 1911   HGBUR NEGATIVE 06/29/2018 1911   BILIRUBINUR NEGATIVE 06/29/2018 1911   KETONESUR NEGATIVE 06/29/2018 1911   PROTEINUR NEGATIVE 06/29/2018 1911   UROBILINOGEN 1.0 05/04/2015 1535   NITRITE NEGATIVE 06/29/2018 1911   LEUKOCYTESUR LARGE (A) 06/29/2018 1911   Sepsis Labs: @LABRCNTIP (procalcitonin:4,lacticidven:4)  ) Recent Results (from the past 240 hour(s))  Urine culture     Status: Abnormal (Preliminary result)   Collection Time: 06/29/18  6:05 PM  Result Value Ref Range Status   Specimen Description URINE, CLEAN CATCH  Final   Special Requests   Final    NONE Performed at [facility], [place] [address] [place], [place] [address]    Culture >=  100,000 COLONIES/mL ESCHERICHIA COLI (A)  Final   Report Status PENDING  Incomplete  MRSA PCR Screening     Status: Abnormal   Collection Time: 06/30/18  4:47 AM  Result Value Ref Range Status   MRSA by PCR POSITIVE (A) NEGATIVE Final    Comment:        The GeneXpert MRSA Assay (FDA approved for NASAL specimens only), is one component of a comprehensive MRSA colonization surveillance program. It is not intended to diagnose MRSA infection nor to guide or monitor treatment for MRSA infections. RESULT CALLED TO, READ BACK BY AND VERIFIED WITH: Alanson Puls RN 9:35 06/30/18 ([email or web address]) Performed at [facility], [place] [address] [place], [place] [address]   C difficile quick scan w PCR reflex     Status: None   Collection Time: 06/30/18  9:59 AM  Result Value Ref Range Status   C Diff antigen NEGATIVE NEGATIVE Final   C Diff toxin NEGATIVE NEGATIVE Final   C Diff interpretation No C. difficile detected.  Final    Comment: Performed at [facility], [place] [address] [place], [place] [address]      Studies: No results found.  Scheduled Meds: . aspirin  81 mg Oral Daily  . carvedilol  6.25 mg Oral BID WC    . Chlorhexidine Gluconate Cloth  6 each Topical Q0600  . cholecalciferol  1,000 Units Oral Daily  . donepezil  10 mg Oral QHS  . enoxaparin (LOVENOX) injection  30 mg Subcutaneous Q24H  . folic acid  1 mg Oral Daily  . gabapentin  200 mg Oral BID  . insulin aspart  0-9 Units Subcutaneous TID WC  . loratadine  10 mg Oral Daily  . mirtazapine  3.75 mg Oral QHS  . mupirocin ointment  1 application Nasal BID  . rOPINIRole  0.5 mg Oral QHS  . sertraline  150 mg Oral QHS  . white petrolatum        Continuous Infusions: . sodium chloride Stopped (07/01/18 0004)  . piperacillin-tazobactam (ZOSYN)  IV 3.375 g (07/01/18 1624)  .  sodium bicarbonate infusion 1/4 NS 1000 mL 75 mL/hr at 07/01/18 1746     LOS: 0 days     Annita Brod, MD [facility]  To reach me or the doctor on call, go to: [email or web address] Password [email or web address]  07/01/2018, 6:20 PM

## 2018-07-01 NOTE — Progress Notes (Signed)
CRITICAL VALUE ALERT  Critical Value:  cbg 16 Date & Time Notied:  07/01/2018 1350 Provider Notified:Krishnan Orders Received/Actions taken: given 50-50 per protocol latest cbg 210

## 2018-07-01 NOTE — Progress Notes (Signed)
CRITICAL VALUE ALERT  Critical Value:  cbg 665  Date & Time Notied: 07/01/2018  Provider Notified:KaKrakandy Orders Received/Actions taken:waiting for orders.

## 2018-07-01 NOTE — Progress Notes (Signed)
Pt ABG result ph 7.18, co2 52.4, po2 147, hco3 19.2, currently 02 [place] at 3 liters/min, paged Dr. Maryland Pink waiting for new order.

## 2018-07-02 ENCOUNTER — Encounter (HOSPITAL_COMMUNITY): Payer: Self-pay | Admitting: Internal Medicine

## 2018-07-02 DIAGNOSIS — F01518 Vascular dementia, unspecified severity, with other behavioral disturbance: Secondary | ICD-10-CM | POA: Diagnosis present

## 2018-07-02 DIAGNOSIS — E872 Acidosis, unspecified: Secondary | ICD-10-CM | POA: Diagnosis present

## 2018-07-02 DIAGNOSIS — F0151 Vascular dementia with behavioral disturbance: Secondary | ICD-10-CM

## 2018-07-02 DIAGNOSIS — K529 Noninfective gastroenteritis and colitis, unspecified: Secondary | ICD-10-CM | POA: Diagnosis present

## 2018-07-02 DIAGNOSIS — G8191 Hemiplegia, unspecified affecting right dominant side: Secondary | ICD-10-CM

## 2018-07-02 LAB — URINE CULTURE: Culture: >=100000 — AB

## 2018-07-02 LAB — GLUCOSE, CAPILLARY
GLUCOSE-CAPILLARY: 16 mg/dL — AB (ref 70–99)
GLUCOSE-CAPILLARY: 105 mg/dL — AB (ref 70–99)
GLUCOSE-CAPILLARY: 106 mg/dL — AB (ref 70–99)
GLUCOSE-CAPILLARY: 108 mg/dL — AB (ref 70–99)
Glucose-Capillary: 115 mg/dL — ABNORMAL HIGH (ref 70–99)
Glucose-Capillary: 122 mg/dL — ABNORMAL HIGH (ref 70–99)
Glucose-Capillary: 96 mg/dL (ref 70–99)

## 2018-07-02 LAB — BLOOD GAS, ARTERIAL
Acid-base deficit: 5.3 mmol/L — ABNORMAL HIGH (ref 0.0–2.0)
BICARBONATE: 20.1 mmol/L (ref 20.0–28.0)
Drawn by: 34719
O2 Content: 3 L/min
O2 Saturation: 98.4 %
PATIENT TEMPERATURE: 97.8
PCO2 ART: 41.8 mmHg (ref 32.0–48.0)
PO2 ART: 126 mmHg — AB (ref 83.0–108.0)
pH, Arterial: 7.301 — ABNORMAL LOW (ref 7.350–7.450)

## 2018-07-02 LAB — BASIC METABOLIC PANEL
ANION GAP: 7 (ref 5–15)
BUN: 8 mg/dL (ref 8–23)
CALCIUM: 8 mg/dL — AB (ref 8.9–10.3)
CO2: 20 mmol/L — AB (ref 22–32)
Chloride: 110 mmol/L (ref 98–111)
Creatinine, Ser: 1.17 mg/dL — ABNORMAL HIGH (ref 0.44–1.00)
GFR calc non Af Amer: 42 mL/min — ABNORMAL LOW (ref >60)
GFR, EST AFRICAN AMERICAN: 49 mL/min — AB (ref >60)
Glucose, Bld: 124 mg/dL — ABNORMAL HIGH (ref 70–99)
Potassium: 3.4 mmol/L — ABNORMAL LOW (ref 3.5–5.1)
Sodium: 137 mmol/L (ref 135–145)

## 2018-07-02 LAB — CBC
HEMATOCRIT: 29.7 % — AB (ref 36.0–46.0)
Hemoglobin: 8.9 g/dL — ABNORMAL LOW (ref 12.0–15.0)
MCH: 24.6 pg — AB (ref 26.0–34.0)
MCHC: 30 g/dL (ref 30.0–36.0)
MCV: 82 fL (ref 80.0–100.0)
Platelets: 379 10*3/uL (ref 150–400)
RBC: 3.62 MIL/uL — ABNORMAL LOW (ref 3.87–5.11)
RDW: 16.9 % — AB (ref 11.5–15.5)
WBC: 11.6 10*3/uL — ABNORMAL HIGH (ref 4.0–10.5)
nRBC: 0.3 % — ABNORMAL HIGH (ref 0.0–0.2)

## 2018-07-02 MED ORDER — ENOXAPARIN SODIUM 40 MG/0.4ML ~~LOC~~ SOLN
40.0000 mg | SUBCUTANEOUS | Status: DC
  Administered 2018-07-02 – 2018-07-04 (×3): 40 mg via SUBCUTANEOUS
  Filled 2018-07-02 (×3): qty 0.4

## 2018-07-02 MED ORDER — CEFDINIR 300 MG PO CAPS
300.0000 mg | ORAL_CAPSULE | Freq: Two times a day (BID) | ORAL | Status: AC
  Administered 2018-07-02 – 2018-07-04 (×5): 300 mg via ORAL
  Filled 2018-07-02 (×5): qty 1

## 2018-07-02 MED ORDER — CEFDINIR 300 MG PO CAPS: 600.0000 mg | ORAL_CAPSULE | Freq: Every day | ORAL | Status: DC

## 2018-07-02 MED ORDER — METRONIDAZOLE 500 MG PO TABS
500.0000 mg | ORAL_TABLET | Freq: Three times a day (TID) | ORAL | Status: AC
  Administered 2018-07-02 – 2018-07-04 (×7): 500 mg via ORAL
  Filled 2018-07-02 (×7): qty 1

## 2018-07-02 NOTE — Progress Notes (Signed)
Pt more alert and responsive she eating her breakfast now.

## 2018-07-02 NOTE — Progress Notes (Signed)
Pt was OOB to chair and now back to bed.

## 2018-07-02 NOTE — Social Work (Signed)
Updated facility with likely discharge tomorrow.  Discharge summary will need to be faxed to [facility] at [phone] and RN to call report to [phone] for 300 hall Rn.  DNR on chart to be signed. For weekend discharge please call [phone].  Westley Hummer, MSW, [place] Work [phone]

## 2018-07-02 NOTE — Progress Notes (Signed)
PROGRESS NOTE  Cathy Taylor HDQ:[id number] DOB: 11/08/1932 DOA: 06/29/2018 PCP: Maryella Shivers, MD  HPI/Recap of past 25 hours: 84 year old female comes from a skilled nursing facility with past history of mild dementia status post CVA, polymyalgia rheumatica, diastolic dysfunction and brought in for worsening confusion over the past month.  Patient quite somnolent and found to have large UTI as well as signs consistent with ischemic versus infectious colitis plus acute kidney injury.  Started on IV Zosyn and IV fluids.  Through 10/10, patient remains somnolent although lab work noted improvement in renal function white blood cell count, electrolyte abnormalities.  ABG checked and patient found to have respiratory and metabolic acidosis.  Started on bicarb drip.  Overnight, no issues.  Today, patient is much more awake and alert and appropriate.  White blood cell count and acidosis much improved.  Patient able to start taking p.o.  Urine culture is back for Cipro resistant E. coli.  Zosyn changed to p.o. Flagyl and cephalosporin.  Patient herself has no complaints  Assessment/Plan: Principal Problem:   Nausea & vomiting: Likely in the setting of colitis Active Problems:   Hypertension   Diabetes mellitus ([place]): Came in with hypoglycemia, likely due to infection and poor p.o. intake.  Initially on dextrose drip.  Received steroid stim test this morning which led to CBG of greater than 600.  Stopped dextrose and IV fluids given NovoLog coverage only which then led to repeat episode of hypoglycemia.  Had to add dextrose to patient's bicarb drip.  Now that she is awake and able to take p.o. and infection better treated, will discontinue dextrose and IV fluids and monitor CBGs every 4 hours .   PMR (polymyalgia rheumatica) (HCC)   Memory loss   CVA (cerebral vascular accident) [facility]): Left-sided hemiplegia, wheelchair-bound   Cardiomyopathy [facility]): Monitoring input and output.   Status post  placement of implantable loop recorder   Acute lower UTI: Urine cultures positive for Cipro resistant E. coli.  Change to p.o. cephalosporin Colitis: Ischemic versus infectious.  Zosyn changed to p.o. Flagyl Senile dementia, mild with acute behavioral disturbance/acute metabolic encephalopathy: Secondary to infection and metabolic acidosis.:  Much improved with treatment of infection and acidosis.  Have also stopped her sedating medications  Overweight: Patient meets criteria with BMI greater than 25 Metabolic/respiratory acidosis: Given patient's somnolence, ABG checked.  Found to have pH of 7.18 with PCO2 of 52 and PO2 of 147 with bicarb of 19.  Signs consistent with a mild respiratory acidosis and non-anion gap metabolic acidosis.  Suspect the latter may be from diarrhea brought on by colitis.  Responded well to bicarb drip.  Have discontinued.    Code Status: DNR  Family Communication: Left message for family  Disposition Plan: Continue supplemental care.  Return back to skilled nursing possibly tomorrow if able to maintain blood sugars without dextrose drip   Consultants:  None  Procedures:  None  Antimicrobials:  IV Zosyn 10/9- 10/11  Omnicef 10/11-present  Flagyl 10/11-present  DVT prophylaxis: Lovenox   Objective: Vitals:   07/02/18 0426 07/02/18 1352  BP: (!) 115/48 122/63  Pulse: (!) 53 (!) 56  Resp:  18  Temp: 97.8 F (36.6 C) 98.7 F (37.1 C)  SpO2: 91% 90%    Intake/Output Summary (Last 24 hours) at 07/02/2018 1421 Last data filed at 07/02/2018 0900 Gross per 24 hour  Intake 2853.26 ml  Output 1000 ml  Net 1853.26 ml   Filed Weights   06/29/18 1737 06/30/18 0500 07/02/18  0500  Weight: 62.1 kg 64.2 kg 66 kg   Body mass index is 25.77 kg/m.  Exam:   General: Awake, oriented x2, no acute distress  HEENT: Normocephalic and atraumatic, mucous memories are slightly dry  Neck: Supple, no JVD  Cardiovascular: Regular rate and rhythm,  S1-S2  Respiratory: Clear to auscultation bilaterally  Abdomen: Soft, nondistended, nontender, hypoactive bowel sounds  Musculoskeletal: No clubbing or cyanosis, 1+ pitting edema  Skin: No skin breaks, tears or lesions  Psychiatry: Appropriate with some mild underlying dementia.  No evidence of acute psychoses  Neuro: Right-sided hemiplegia, lower extremity worse than upper extremity   Data Reviewed: CBC: Recent Labs  Lab 06/29/18 1751 06/30/18 0401 06/30/18 0533 06/30/18 0730 07/01/18 1117 07/02/18 0946  WBC 13.2* 9.5 14.2* 15.0* 12.9* 11.6*  NEUTROABS 8.9*  --   --   --   --   --   HGB 11.1* 6.7* 9.7* 9.7* 8.9* 8.9*  HCT 37.2 22.1* 31.7* 31.0* 30.0* 29.7*  MCV 81.8 82.2 81.3 80.7 81.7 82.0  PLT 561* 302 435* 411* 398 209   Basic Metabolic Panel: Recent Labs  Lab 06/29/18 1751 06/30/18 0401 06/30/18 0533 06/30/18 0715 07/01/18 0633 07/02/18 0946  NA 132*  --  130*  --  140 137  K 4.8  --  3.9  --  3.4* 3.4*  CL 100  --  103  --  119* 110  CO2 20*  --  17*  --  16* 20*  GLUCOSE 81  --  186*  --  665* 124*  BUN 23  --  21  --  10 8  CREATININE 1.58*  --  1.43*  --  1.18* 1.17*  CALCIUM 9.0  --  7.8*  --  6.9* 8.0*  MG  --  0.6* REORDERING, ADD ON SAMPLE WAS QUESTIONABLE RESULTS FROM EARLIER 1.1* 1.4*  --    GFR: Estimated Creatinine Clearance: 33.2 mL/min (A) (by C-G formula based on SCr of 1.17 mg/dL (H)). Liver Function Tests: Recent Labs  Lab 06/29/18 1751 06/30/18 0533 07/01/18 0633  AST 16 14* 12*  ALT 7 6 7   ALKPHOS 71 56 45  BILITOT 0.5 0.7 0.3  PROT 7.4 6.2* 4.7*  ALBUMIN 2.7* 2.3* 1.8*   No results for input(s): LIPASE, AMYLASE in the last 168 hours. No results for input(s): AMMONIA in the last 168 hours. Coagulation Profile: No results for input(s): INR, PROTIME in the last 168 hours. Cardiac Enzymes: Recent Labs  Lab 06/30/18 0730  CKTOTAL 18  TROPONINI <0.03   BNP (last 3 results) No results for input(s): PROBNP in the last 8760  hours. HbA1C: No results for input(s): HGBA1C in the last 72 hours. CBG: Recent Labs  Lab 07/01/18 2009 07/02/18 0017 07/02/18 0347 07/02/18 0807 07/02/18 1255  GLUCAP 191* 115* 105* 96 122*   Lipid Profile: No results for input(s): CHOL, HDL, LDLCALC, TRIG, CHOLHDL, LDLDIRECT in the last 72 hours. Thyroid Function Tests: Recent Labs    06/30/18 0401  TSH 2.046   Anemia Panel: Recent Labs    06/30/18 0730  VITAMINB12 346  FOLATE 44.6  FERRITIN 72  TIBC 231*  IRON 22*  RETICCTPCT 1.1   Urine analysis:    Component Value Date/Time   COLORURINE YELLOW 06/29/2018 1911   APPEARANCEUR CLOUDY (A) 06/29/2018 1911   LABSPEC 1.012 06/29/2018 1911   PHURINE 5.0 06/29/2018 1911   GLUCOSEU NEGATIVE 06/29/2018 1911   HGBUR NEGATIVE 06/29/2018 1911   BILIRUBINUR NEGATIVE 06/29/2018 1911  KETONESUR NEGATIVE 06/29/2018 1911   PROTEINUR NEGATIVE 06/29/2018 1911   UROBILINOGEN 1.0 05/04/2015 1535   NITRITE NEGATIVE 06/29/2018 1911   LEUKOCYTESUR LARGE (A) 06/29/2018 1911   Sepsis Labs: @LABRCNTIP (procalcitonin:4,lacticidven:4)  ) Recent Results (from the past 240 hour(s))  Urine culture     Status: Abnormal   Collection Time: 06/29/18  6:05 PM  Result Value Ref Range Status   Specimen Description URINE, CLEAN CATCH  Final   Special Requests   Final    NONE Performed at [facility], [place] [address], [place], [place] [address]    Culture >=100,000 COLONIES/mL ESCHERICHIA COLI (A)  Final   Report Status 07/02/2018 FINAL  Final   Organism ID, Bacteria ESCHERICHIA COLI (A)  Final      Susceptibility   Escherichia coli - MIC*    AMPICILLIN >=32 RESISTANT Resistant     CEFAZOLIN 8 SENSITIVE Sensitive     CEFTRIAXONE <=1 SENSITIVE Sensitive     CIPROFLOXACIN >=4 RESISTANT Resistant     GENTAMICIN <=1 SENSITIVE Sensitive     IMIPENEM <=0.25 SENSITIVE Sensitive     NITROFURANTOIN <=16 SENSITIVE Sensitive     TRIMETH/SULFA <=20 SENSITIVE Sensitive      AMPICILLIN/SULBACTAM >=32 RESISTANT Resistant     PIP/TAZO 16 SENSITIVE Sensitive     Extended ESBL NEGATIVE Sensitive     * >=100,000 COLONIES/mL ESCHERICHIA COLI  MRSA PCR Screening     Status: Abnormal   Collection Time: 06/30/18  4:47 AM  Result Value Ref Range Status   MRSA by PCR POSITIVE (A) NEGATIVE Final    Comment:        The GeneXpert MRSA Assay (FDA approved for NASAL specimens only), is one component of a comprehensive MRSA colonization surveillance program. It is not intended to diagnose MRSA infection nor to guide or monitor treatment for MRSA infections. RESULT CALLED TO, READ BACK BY AND VERIFIED WITH: Alanson Puls RN 9:35 06/30/18 ([email or web address]) Performed at [facility], [place] [address] [place], [place] [address]   C difficile quick scan w PCR reflex     Status: None   Collection Time: 06/30/18  9:59 AM  Result Value Ref Range Status   C Diff antigen NEGATIVE NEGATIVE Final   C Diff toxin NEGATIVE NEGATIVE Final   C Diff interpretation No C. difficile detected.  Final    Comment: Performed at [facility], [place] [address] [place],  [address]      Studies: No results found.  Scheduled Meds: . aspirin  81 mg Oral Daily  . carvedilol  6.25 mg Oral BID WC  . cefdinir  300 mg Oral Q12H  . Chlorhexidine Gluconate Cloth  6 each Topical Q0600  . cholecalciferol  1,000 Units Oral Daily  . donepezil  10 mg Oral QHS  . enoxaparin (LOVENOX) injection  40 mg Subcutaneous Q24H  . folic acid  1 mg Oral Daily  . loratadine  10 mg Oral Daily  . metroNIDAZOLE  500 mg Oral Q8H  . mirtazapine  3.75 mg Oral QHS  . mupirocin ointment  1 application Nasal BID  . sertraline  150 mg Oral QHS    Continuous Infusions: . sodium chloride Stopped (07/01/18 0004)     LOS: 1 day     Annita Brod, MD [facility]  To reach me or the doctor on call, go to: [email or web address] Password [email or web address]  07/02/2018, 2:21 PM

## 2018-07-02 NOTE — Progress Notes (Deleted)
RT offered CPAP to pt and pt stated he did not want to wear it tonight. RT told pt to call if he changed his mind. RT will continue to monitor.

## 2018-07-03 ENCOUNTER — Inpatient Hospital Stay (HOSPITAL_COMMUNITY): Payer: Medicare Other

## 2018-07-03 DIAGNOSIS — J9601 Acute respiratory failure with hypoxia: Secondary | ICD-10-CM

## 2018-07-03 LAB — BLOOD GAS, ARTERIAL
Acid-base deficit: 2.1 mmol/L — ABNORMAL HIGH (ref 0.0–2.0)
Bicarbonate: 22 mmol/L (ref 20.0–28.0)
DRAWN BY: 47407
O2 Content: 4 L/min
O2 SAT: 99.1 %
PCO2 ART: 36.8 mmHg (ref 32.0–48.0)
PH ART: 7.395 (ref 7.350–7.450)
Patient temperature: 98.6
pO2, Arterial: 164 mmHg — ABNORMAL HIGH (ref 83.0–108.0)

## 2018-07-03 LAB — BASIC METABOLIC PANEL
Anion gap: 9 (ref 5–15)
BUN: 7 mg/dL — ABNORMAL LOW (ref 8–23)
CALCIUM: 8.3 mg/dL — AB (ref 8.9–10.3)
CO2: 21 mmol/L — AB (ref 22–32)
CREATININE: 1.2 mg/dL — AB (ref 0.44–1.00)
Chloride: 109 mmol/L (ref 98–111)
GFR calc non Af Amer: 41 mL/min — ABNORMAL LOW (ref >60)
GFR, EST AFRICAN AMERICAN: 47 mL/min — AB (ref >60)
Glucose, Bld: 83 mg/dL (ref 70–99)
Potassium: 3.1 mmol/L — ABNORMAL LOW (ref 3.5–5.1)
SODIUM: 139 mmol/L (ref 135–145)

## 2018-07-03 LAB — GLUCOSE, CAPILLARY
GLUCOSE-CAPILLARY: 92 mg/dL (ref 70–99)
GLUCOSE-CAPILLARY: 103 mg/dL — AB (ref 70–99)
Glucose-Capillary: 72 mg/dL (ref 70–99)
Glucose-Capillary: 77 mg/dL (ref 70–99)
Glucose-Capillary: 86 mg/dL (ref 70–99)
Glucose-Capillary: 96 mg/dL (ref 70–99)
Glucose-Capillary: 96 mg/dL (ref 70–99)

## 2018-07-03 LAB — CBC
HCT: 29.4 % — ABNORMAL LOW (ref 36.0–46.0)
Hemoglobin: 9.3 g/dL — ABNORMAL LOW (ref 12.0–15.0)
MCH: 25.3 pg — ABNORMAL LOW (ref 26.0–34.0)
MCHC: 31.6 g/dL (ref 30.0–36.0)
MCV: 79.9 fL — ABNORMAL LOW (ref 80.0–100.0)
Platelets: 392 10*3/uL (ref 150–400)
RBC: 3.68 MIL/uL — ABNORMAL LOW (ref 3.87–5.11)
RDW: 16.7 % — AB (ref 11.5–15.5)
WBC: 11.7 10*3/uL — AB (ref 4.0–10.5)
nRBC: 0.2 % (ref 0.0–0.2)

## 2018-07-03 LAB — MAGNESIUM: MAGNESIUM: 1.7 mg/dL (ref 1.7–2.4)

## 2018-07-03 LAB — BRAIN NATRIURETIC PEPTIDE: B Natriuretic Peptide: 733.7 pg/mL — ABNORMAL HIGH (ref 0.0–100.0)

## 2018-07-03 MED ORDER — FUROSEMIDE 10 MG/ML IJ SOLN
20.0000 mg | Freq: Two times a day (BID) | INTRAMUSCULAR | Status: DC
  Administered 2018-07-03 – 2018-07-05 (×4): 20 mg via INTRAVENOUS
  Filled 2018-07-03 (×5): qty 2

## 2018-07-03 MED ORDER — MAGNESIUM SULFATE 2 GM/50ML IV SOLN
2.0000 g | Freq: Once | INTRAVENOUS | Status: AC
  Administered 2018-07-03: 2 g via INTRAVENOUS
  Filled 2018-07-03: qty 50

## 2018-07-03 MED ORDER — SODIUM BICARBONATE 8.4 % IV SOLN
50.0000 meq | Freq: Once | INTRAVENOUS | Status: AC
  Administered 2018-07-03: 50 meq via INTRAVENOUS
  Filled 2018-07-03: qty 50

## 2018-07-03 MED ORDER — POTASSIUM CHLORIDE CRYS ER 20 MEQ PO TBCR
40.0000 meq | EXTENDED_RELEASE_TABLET | Freq: Two times a day (BID) | ORAL | Status: AC
  Administered 2018-07-03 (×2): 40 meq via ORAL
  Filled 2018-07-03 (×2): qty 2

## 2018-07-03 NOTE — Progress Notes (Signed)
Went in to assess pt. Blood pressure and pulse prior to administering beta blocker, observed pt. O2 sats were 78. Notified preceptor Drue Dun, called Rapid Ronda Fairly, and paged MD Maryland Pink for orders (see chart). Pt. Placed on 4L of O2 via nasal cannula O2 sats are currently 100%. Will continue to monitor.

## 2018-07-03 NOTE — Significant Event (Signed)
Rapid Response Event Note RN called for O2 sats 78% Overview:  RN noted pt's sats to be 78% while obtaining VS. On NRB on my arrival    Initial Focused Assessment: On arrival pt lying supine in bed, hand were cool to touch, not a good wave form on monitor. Pt alert and oriented to self and place, which is her norm per RN. Pt in no respiratory distress. Dr. Maryland Pink notified, new orders given and completed  Interventions: CXR [place] 4L  Stopped fluids Magnesium gtt Bicarb IVP  Plan of Care (if not transferred): Continue to monitor  Event Summary:   at      at          [facility], Sela Hua

## 2018-07-03 NOTE — Progress Notes (Signed)
PROGRESS NOTE  Sherise Geerdes ATF:[id number] DOB: September 25, 1932 DOA: 06/29/2018 PCP: Maryella Shivers, MD  HPI/Recap of past 70 hours: 84 year old female comes from a skilled nursing facility with past history of mild dementia status post CVA, polymyalgia rheumatica, diastolic dysfunction and brought in for worsening confusion over the past month.  Patient quite somnolent and found to have large UTI as well as signs consistent with ischemic versus infectious colitis plus acute kidney injury.  Started on IV Zosyn and IV fluids.  Through 10/10, patient remains somnolent although lab work noted improvement in renal function white blood cell count, electrolyte abnormalities.  ABG checked and patient found to have respiratory and metabolic acidosis.  Started on bicarb drip.  By 10/11, patient was much more awake and alert.  Antibiotics changed to p.o. Cipro and Flagyl.  Acidosis and white blood cell count improved as well.  Bicarbonate drip stopped.    Overnight, CBG went to as low as 72 so started on D5 normal saline at 75 cc an hour.  This morning, noted to be hypoxic, initially requiring Venturi mask, but then transitioned over to 4 L.  BNP this morning slightly elevated at 722.  ABG and chest x-ray notes signs consistent with volume overload.  Assessment/Plan: Principal Problem:   Nausea & vomiting: Likely in the setting of colitis.  Improved, although I suspect she has some mild underlying nausea and poor p.o. intake from the p.o. antibiotics.  Tomorrow will be 5 days of antibiotics.  Antibiotics tomorrow after first dose and see if her appetite improves. Active Problems:   Hypertension   Diabetes mellitus ([place]): Came in with hypoglycemia, likely due to infection and poor p.o. intake.  Initially on dextrose drip.  Received steroid stim test this morning which led to CBG of greater than 600.  Stopped dextrose and IV fluids given NovoLog coverage only which then led to repeat episode of hypoglycemia.  Had  to add dextrose to patient's bicarb drip.  Now that she is awake and able to take p.o. and infection better treated, we discontinued dextrose and IV fluids and monitor CBGs every 4 hours.  Mild hypoglycemia overnight, so dextrose drip restarted.  Have stopped this  Volume overload: Stopped fluids, started gentle diuretics. Marland Kitchen   PMR (polymyalgia rheumatica) (HCC)   Memory loss   CVA (cerebral vascular accident) [facility] Left-sided hemiplegia, wheelchair-bound   Cardiomyopathy [facility]): Monitoring input and output.   Status post placement of implantable loop recorder   Acute lower UTI: Urine cultures positive for Cipro resistant E. coli.  Change to p.o. cephalosporin Colitis: Ischemic versus infectious.  Zosyn changed to p.o. Flagyl Senile dementia, mild with acute behavioral disturbance/acute metabolic encephalopathy: Secondary to infection and metabolic acidosis.:  Some improvement with treatment of infection and acidosis, although more flattened affect today..  Have also stopped her sedating medications  Overweight: Patient meets criteria with BMI greater than 25 Metabolic/respiratory acidosis: Given patient's somnolence, ABG checked.  Found to have pH of 7.18 with PCO2 of 52 and PO2 of 147 with bicarb of 19.  Signs consistent with a mild respiratory acidosis and non-anion gap metabolic acidosis.  Suspect the latter may be from diarrhea brought on by colitis.  Responded well to bicarb drip.  Have discontinued.  Hypomagnesemia/hypokalemia: Replacing  Acute respiratory failure with hypoxia: Secondary to volume overload.  Diuresing  Code Status: DNR  Family Communication: Multiple family members at the bedside  Disposition Plan: Continue supplemental care.  Return back to skilled nursing once able to maintain better  p.o. intake, CBGs without dextrose drip and off of oxygen once better diuresed.  Potentially as early as 10/14   Consultants:  None  Procedures:  None  Antimicrobials:  IV  Zosyn 10/9- 10/11  Omnicef 10/11-present  Flagyl 10/11-present  DVT prophylaxis: Lovenox   Objective: Vitals:   07/03/18 1406 07/03/18 1735  BP: (!) 150/70 (!) 149/65  Pulse: (!) 57 63  Resp: 14   Temp: 98.4 F (36.9 C)   SpO2: (!) 79%     Intake/Output Summary (Last 24 hours) at 07/03/2018 1750 Last data filed at 07/03/2018 1430 Gross per 24 hour  Intake 753.8 ml  Output 1100 ml  Net -346.2 ml   Filed Weights   06/30/18 0500 07/02/18 0500 07/03/18 0500  Weight: 64.2 kg 66 kg 64.4 kg   Body mass index is 25.15 kg/m.  Exam:   General: Awake, oriented x2, flattened affect  HEENT: Normocephalic and atraumatic, mucous memories are slightly dry  Neck: Supple, no JVD  Cardiovascular: Regular rate and rhythm, S1-S2  Respiratory: Clear to auscultation bilaterally  Abdomen: Soft, nondistended, nontender, hypoactive bowel sounds  Musculoskeletal: No clubbing or cyanosis, 1+ pitting edema  Skin: No skin breaks, tears or lesions  Psychiatry: Appropriate with some mild underlying dementia.  No evidence of acute psychoses  Neuro: Right-sided hemiplegia, lower extremity worse than upper extremity   Data Reviewed: CBC: Recent Labs  Lab 06/29/18 1751  06/30/18 0533 06/30/18 0730 07/01/18 1117 07/02/18 0946 07/03/18 0447  WBC 13.2*   < > 14.2* 15.0* 12.9* 11.6* 11.7*  NEUTROABS 8.9*  --   --   --   --   --   --   HGB 11.1*   < > 9.7* 9.7* 8.9* 8.9* 9.3*  HCT 37.2   < > 31.7* 31.0* 30.0* 29.7* 29.4*  MCV 81.8   < > 81.3 80.7 81.7 82.0 79.9*  PLT 561*   < > 435* 411* 398 379 392   < > = values in this interval not displayed.   Basic Metabolic Panel: Recent Labs  Lab 06/29/18 1751 06/30/18 0401 06/30/18 0533 06/30/18 0715 07/01/18 6599 07/02/18 0946 07/03/18 0447  NA 132*  --  130*  --  140 137 139  K 4.8  --  3.9  --  3.4* 3.4* 3.1*  CL 100  --  103  --  119* 110 109  CO2 20*  --  17*  --  16* 20* 21*  GLUCOSE 81  --  186*  --  665* 124* 83  BUN  23  --  21  --  10 8 7*  CREATININE 1.58*  --  1.43*  --  1.18* 1.17* 1.20*  CALCIUM 9.0  --  7.8*  --  6.9* 8.0* 8.3*  MG  --  0.6* REORDERING, ADD ON SAMPLE WAS QUESTIONABLE RESULTS FROM EARLIER 1.1* 1.4*  --  1.7   GFR: Estimated Creatinine Clearance: 32.1 mL/min (A) (by C-G formula based on SCr of 1.2 mg/dL (H)). Liver Function Tests: Recent Labs  Lab 06/29/18 1751 06/30/18 0533 07/01/18 0633  AST 16 14* 12*  ALT 7 6 7   ALKPHOS 71 56 45  BILITOT 0.5 0.7 0.3  PROT 7.4 6.2* 4.7*  ALBUMIN 2.7* 2.3* 1.8*   No results for input(s): LIPASE, AMYLASE in the last 168 hours. No results for input(s): AMMONIA in the last 168 hours. Coagulation Profile: No results for input(s): INR, PROTIME in the last 168 hours. Cardiac Enzymes: Recent Labs  Lab 06/30/18 0730  CKTOTAL 84  TROPONINI <0.03   BNP (last 3 results) No results for input(s): PROBNP in the last 8760 hours. HbA1C: No results for input(s): HGBA1C in the last 72 hours. CBG: Recent Labs  Lab 07/03/18 0523 07/03/18 0820 07/03/18 1012 07/03/18 1231 07/03/18 1606  GLUCAP 72 96 86 92 103*   Lipid Profile: No results for input(s): CHOL, HDL, LDLCALC, TRIG, CHOLHDL, LDLDIRECT in the last 72 hours. Thyroid Function Tests: No results for input(s): TSH, T4TOTAL, FREET4, T3FREE, THYROIDAB in the last 72 hours. Anemia Panel: No results for input(s): VITAMINB12, FOLATE, FERRITIN, TIBC, IRON, RETICCTPCT in the last 72 hours. Urine analysis:    Component Value Date/Time   COLORURINE YELLOW 06/29/2018 1911   APPEARANCEUR CLOUDY (A) 06/29/2018 1911   LABSPEC 1.012 06/29/2018 1911   PHURINE 5.0 06/29/2018 1911   GLUCOSEU NEGATIVE 06/29/2018 1911   HGBUR NEGATIVE 06/29/2018 1911   BILIRUBINUR NEGATIVE 06/29/2018 1911   KETONESUR NEGATIVE 06/29/2018 1911   PROTEINUR NEGATIVE 06/29/2018 1911   UROBILINOGEN 1.0 05/04/2015 1535   NITRITE NEGATIVE 06/29/2018 1911   LEUKOCYTESUR LARGE (A) 06/29/2018 1911   Sepsis  Labs: @LABRCNTIP (procalcitonin:4,lacticidven:4)  ) Recent Results (from the past 240 hour(s))  Urine culture     Status: Abnormal   Collection Time: 06/29/18  6:05 PM  Result Value Ref Range Status   Specimen Description URINE, CLEAN CATCH  Final   Special Requests   Final    NONE Performed at [facility], [place] [address] [place], [place] [address]    Culture >=100,000 COLONIES/mL ESCHERICHIA COLI (A)  Final   Report Status 07/02/2018 FINAL  Final   Organism ID, Bacteria ESCHERICHIA COLI (A)  Final      Susceptibility   Escherichia coli - MIC*    AMPICILLIN >=32 RESISTANT Resistant     CEFAZOLIN 8 SENSITIVE Sensitive     CEFTRIAXONE <=1 SENSITIVE Sensitive     CIPROFLOXACIN >=4 RESISTANT Resistant     GENTAMICIN <=1 SENSITIVE Sensitive     IMIPENEM <=0.25 SENSITIVE Sensitive     NITROFURANTOIN <=16 SENSITIVE Sensitive     TRIMETH/SULFA <=20 SENSITIVE Sensitive     AMPICILLIN/SULBACTAM >=32 RESISTANT Resistant     PIP/TAZO 16 SENSITIVE Sensitive     Extended ESBL NEGATIVE Sensitive     * >=100,000 COLONIES/mL ESCHERICHIA COLI  MRSA PCR Screening     Status: Abnormal   Collection Time: 06/30/18  4:47 AM  Result Value Ref Range Status   MRSA by PCR POSITIVE (A) NEGATIVE Final    Comment:        The GeneXpert MRSA Assay (FDA approved for NASAL specimens only), is one component of a comprehensive MRSA colonization surveillance program. It is not intended to diagnose MRSA infection nor to guide or monitor treatment for MRSA infections. RESULT CALLED TO, READ BACK BY AND VERIFIED WITH: Alanson Puls RN 9:35 06/30/18 ([email or web address]) Performed at [facility], [place] [address], [place], [place] [address]   C difficile quick scan w PCR reflex     Status: None   Collection Time: 06/30/18  9:59 AM  Result Value Ref Range Status   C Diff antigen NEGATIVE NEGATIVE Final   C Diff toxin NEGATIVE NEGATIVE Final   C Diff interpretation No C. difficile detected.  Final    Comment:  Performed at [facility], [place] [address] [place],  [address]      Studies: Dg Chest Port 1 View  Result Date: 07/03/2018 CLINICAL DATA:  Hypoxia. History  of hypertension, diabetes, bronchiectasis, stroke. EXAM: PORTABLE CHEST 1 VIEW COMPARISON:  Chest x-ray dated 02/08/2016. FINDINGS: Mild cardiomegaly is stable. Atherosclerotic changes again noted at the aortic arch. Loop recorder device is again appreciated over the LEFT heart shadow. Increased opacity at the LEFT lung base, compatible with consolidation and probable small pleural effusion. RIGHT lung is clear. No pneumothorax seen. No acute or suspicious osseous finding. Advanced degenerative change again noted at the RIGHT shoulder. Status post LEFT shoulder arthroplasty. IMPRESSION: 1. New opacity at the LEFT lung base, pneumonia versus atelectasis, and probable small LEFT pleural effusion. 2. Stable mild cardiomegaly. 3.  Aortic Atherosclerosis (ICD10-I70.0). Electronically Signed   By: Franki Cabot M.D.   On: 07/03/2018 10:54    Scheduled Meds: . aspirin  81 mg Oral Daily  . carvedilol  6.25 mg Oral BID WC  . cefdinir  300 mg Oral Q12H  . Chlorhexidine Gluconate Cloth  6 each Topical Q0600  . cholecalciferol  1,000 Units Oral Daily  . donepezil  10 mg Oral QHS  . enoxaparin (LOVENOX) injection  40 mg Subcutaneous Q24H  . folic acid  1 mg Oral Daily  . furosemide  20 mg Intravenous Q12H  . loratadine  10 mg Oral Daily  . metroNIDAZOLE  500 mg Oral Q8H  . mirtazapine  3.75 mg Oral QHS  . mupirocin ointment  1 application Nasal BID  . sertraline  150 mg Oral QHS    Continuous Infusions: . sodium chloride 250 mL (07/02/18 2340)     LOS: 2 days     Annita Brod, MD [facility]  To reach me or the doctor on call, go to: [email or web address] Password [email or web address]  07/03/2018, 5:50 PM

## 2018-07-04 LAB — CBC
HEMATOCRIT: 29.5 % — AB (ref 36.0–46.0)
Hemoglobin: 9.4 g/dL — ABNORMAL LOW (ref 12.0–15.0)
MCH: 25.2 pg — ABNORMAL LOW (ref 26.0–34.0)
MCHC: 31.9 g/dL (ref 30.0–36.0)
MCV: 79.1 fL — AB (ref 80.0–100.0)
NRBC: 0 % (ref 0.0–0.2)
PLATELETS: 401 10*3/uL — AB (ref 150–400)
RBC: 3.73 MIL/uL — ABNORMAL LOW (ref 3.87–5.11)
RDW: 16.3 % — ABNORMAL HIGH (ref 11.5–15.5)
WBC: 12.7 10*3/uL — ABNORMAL HIGH (ref 4.0–10.5)

## 2018-07-04 LAB — GLUCOSE, CAPILLARY
GLUCOSE-CAPILLARY: 110 mg/dL — AB (ref 70–99)
GLUCOSE-CAPILLARY: 99 mg/dL (ref 70–99)
Glucose-Capillary: 101 mg/dL — ABNORMAL HIGH (ref 70–99)
Glucose-Capillary: 117 mg/dL — ABNORMAL HIGH (ref 70–99)
Glucose-Capillary: 121 mg/dL — ABNORMAL HIGH (ref 70–99)
Glucose-Capillary: 139 mg/dL — ABNORMAL HIGH (ref 70–99)
Glucose-Capillary: 83 mg/dL (ref 70–99)

## 2018-07-04 LAB — BASIC METABOLIC PANEL
Anion gap: 10 (ref 5–15)
BUN: 8 mg/dL (ref 8–23)
CALCIUM: 8.5 mg/dL — AB (ref 8.9–10.3)
CHLORIDE: 107 mmol/L (ref 98–111)
CO2: 22 mmol/L (ref 22–32)
CREATININE: 1.07 mg/dL — AB (ref 0.44–1.00)
GFR calc non Af Amer: 47 mL/min — ABNORMAL LOW (ref >60)
GFR, EST AFRICAN AMERICAN: 54 mL/min — AB (ref >60)
Glucose, Bld: 100 mg/dL — ABNORMAL HIGH (ref 70–99)
Potassium: 4.1 mmol/L (ref 3.5–5.1)
Sodium: 139 mmol/L (ref 135–145)

## 2018-07-04 LAB — MAGNESIUM: Magnesium: 1.6 mg/dL — ABNORMAL LOW (ref 1.7–2.4)

## 2018-07-04 MED ORDER — MAGNESIUM OXIDE 400 (241.3 MG) MG PO TABS
400.0000 mg | ORAL_TABLET | Freq: Every day | ORAL | Status: DC
  Administered 2018-07-04: 400 mg via ORAL
  Filled 2018-07-04 (×2): qty 1

## 2018-07-04 NOTE — Plan of Care (Signed)
  Problem: Education: Goal: Knowledge of General Education information will improve Description Including pain rating scale, medication(s)/side effects and non-pharmacologic comfort measures Outcome: Progressing   Problem: Health Behavior/Discharge Planning: Goal: Ability to manage health-related needs will improve Outcome: Progressing   Problem: Clinical Measurements: Goal: Ability to maintain clinical measurements within normal limits will improve Outcome: Progressing Goal: Will remain free from infection Outcome: Progressing Goal: Diagnostic test results will improve Outcome: Progressing Goal: Respiratory complications will improve Outcome: Progressing Goal: Cardiovascular complication will be avoided Outcome: Progressing   Problem: Activity: Goal: Risk for activity intolerance will decrease Outcome: Progressing   Problem: Nutrition: Goal: Adequate nutrition will be maintained Outcome: Progressing   Problem: Coping: Goal: Level of anxiety will decrease Outcome: Progressing   Problem: Elimination: Goal: Will not experience complications related to urinary retention Outcome: Progressing   Problem: Pain Managment: Goal: General experience of comfort will improve Outcome: Progressing   Problem: Safety: Goal: Ability to remain free from injury will improve Outcome: Progressing   Problem: Skin Integrity: Goal: Risk for impaired skin integrity will decrease Outcome: Progressing  [name], RN 07/04/2018

## 2018-07-04 NOTE — Progress Notes (Signed)
PROGRESS NOTE  Cathy Taylor OEV:[id number] DOB: 1933/06/25 DOA: 06/29/2018 PCP: Maryella Shivers, MD  HPI/Recap of past 28 hours: 84 year old female comes from a skilled nursing facility with past history of mild dementia status post CVA, polymyalgia rheumatica, diastolic dysfunction and brought in for worsening confusion over the past month.  Patient quite somnolent and found to have large UTI as well as signs consistent with ischemic versus infectious colitis plus acute kidney injury.  Started on IV Zosyn and IV fluids.  Through 10/10, patient remains somnolent although lab work noted improvement in renal function white blood cell count, electrolyte abnormalities.  ABG checked and patient found to have respiratory and metabolic acidosis.  Started on bicarb drip.  By 10/11, patient was much more awake and alert.  Antibiotics changed to p.o. Cipro and Flagyl.  Acidosis and white blood cell count improved as well.  Bicarbonate drip stopped.  On 10/12, noted to be hypoxic, felt to be secondary to volume overload.  Started on IV Lasix.  No events overnight.  Patient maintaining CBGs.  White blood cell count mildly elevated, but renal function better.  No fever.  Assessment/Plan: Principal Problem:   Nausea & vomiting: Likely in the setting of colitis.  Improved, although I suspect she has some mild underlying nausea and poor p.o. intake from the p.o. antibiotics.  Has completed 5 days of antibiotics.  Stopped by this afternoon Active Problems:   Hypertension   Diabetes mellitus ([place]): Came in with hypoglycemia, likely due to infection and poor p.o. intake.  Initially on dextrose drip.  Received steroid stim test this morning which led to CBG of greater than 600.  Stopped dextrose and IV fluids given NovoLog coverage only which then led to repeat episode of hypoglycemia.  Had to add dextrose to patient's bicarb drip.  Now that she is awake and able to take p.o. and infection better treated, we  discontinued dextrose and IV fluids and monitor CBGs every 4 hours.  CBGs stable Volume overload: Stopped fluids, started gentle diuretics. Marland Kitchen   PMR (polymyalgia rheumatica) (HCC)   Memory loss   CVA (cerebral vascular accident) [facility]): Left-sided hemiplegia, wheelchair-bound   Cardiomyopathy [facility]): Monitoring input and output.   Status post placement of implantable loop recorder   Acute lower UTI: Urine cultures positive for Cipro resistant E. coli.  Changed to p.o. Omnicef.  Completed 5 days of antibiotics Colitis: Ischemic versus infectious.  Zosyn changed to p.o. Flagyl, completed 5 days of antibiotics Senile dementia, mild with acute behavioral disturbance/acute metabolic encephalopathy: Secondary to infection and metabolic acidosis.:  Some improvement with treatment of infection and acidosis, although more flattened affect today..  Have also stopped her sedating medications  Overweight: Patient meets criteria with BMI greater than 25 Metabolic/respiratory acidosis: Given patient's somnolence, ABG checked.  Found to have pH of 7.18 with PCO2 of 52 and PO2 of 147 with bicarb of 19.  Signs consistent with a mild respiratory acidosis and non-anion gap metabolic acidosis.  Suspect the latter may be from diarrhea brought on by colitis.  Responded well to bicarb drip.  Have discontinued.  Hypomagnesemia/hypokalemia: Replacing  Acute respiratory failure with hypoxia: Secondary to volume overload.  Diuresing  Code Status: DNR  Family Communication: Spoke with daughter and son-in-law at the bedside  Disposition Plan: Continue supplemental care.  Return back to skilled nursing once able to maintain better p.o. intake, CBGs without dextrose drip and off of oxygen once better diuresed.  Potentially as early as 10/14   Consultants:  None  Procedures:  None  Antimicrobials:  IV Zosyn 10/9- 10/11  Omnicef 10/11- 10/14  Flagyl 10/11- 10/14  DVT  prophylaxis: Lovenox   Objective: Vitals:   07/04/18 1100 07/04/18 1200  BP: (!) 99/56 (!) 111/56  Pulse: 65 (!) 59  Resp: 14   Temp: 98.6 F (37 C)   SpO2: 95%     Intake/Output Summary (Last 24 hours) at 07/04/2018 1548 Last data filed at 07/03/2018 2133 Gross per 24 hour  Intake 170 ml  Output 600 ml  Net -430 ml   Filed Weights   06/30/18 0500 07/02/18 0500 07/03/18 0500  Weight: 64.2 kg 66 kg 64.4 kg   Body mass index is 25.15 kg/m.  Exam:   General: Awake, oriented x2, flattened affect  HEENT: Normocephalic and atraumatic, mucous memories are moist  Neck: Supple, no JVD  Cardiovascular: Regular rate and rhythm, S1-S2  Respiratory: Clear to auscultation bilaterally  Abdomen: Soft, nondistended, nontender, hypoactive bowel sounds  Musculoskeletal: No clubbing or cyanosis, 1+ pitting edema  Skin: No skin breaks, tears or lesions  Psychiatry: Appropriate with some mild underlying dementia.  No evidence of acute psychoses  Neuro: Right-sided hemiplegia, lower extremity worse than upper extremity   Data Reviewed: CBC: Recent Labs  Lab 06/29/18 1751  06/30/18 0730 07/01/18 1117 07/02/18 0946 07/03/18 0447 07/04/18 0912  WBC 13.2*   < > 15.0* 12.9* 11.6* 11.7* 12.7*  NEUTROABS 8.9*  --   --   --   --   --   --   HGB 11.1*   < > 9.7* 8.9* 8.9* 9.3* 9.4*  HCT 37.2   < > 31.0* 30.0* 29.7* 29.4* 29.5*  MCV 81.8   < > 80.7 81.7 82.0 79.9* 79.1*  PLT 561*   < > 411* 398 379 392 401*   < > = values in this interval not displayed.   Basic Metabolic Panel: Recent Labs  Lab 06/30/18 0533 06/30/18 0715 07/01/18 7782 07/02/18 0946 07/03/18 0447 07/04/18 0912  NA 130*  --  140 137 139 139  K 3.9  --  3.4* 3.4* 3.1* 4.1  CL 103  --  119* 110 109 107  CO2 17*  --  16* 20* 21* 22  GLUCOSE 186*  --  665* 124* 83 100*  BUN 21  --  10 8 7* 8  CREATININE 1.43*  --  1.18* 1.17* 1.20* 1.07*  CALCIUM 7.8*  --  6.9* 8.0* 8.3* 8.5*  MG REORDERING, ADD ON  SAMPLE WAS QUESTIONABLE RESULTS FROM EARLIER 1.1* 1.4*  --  1.7 1.6*   GFR: Estimated Creatinine Clearance: 36 mL/min (A) (by C-G formula based on SCr of 1.07 mg/dL (H)). Liver Function Tests: Recent Labs  Lab 06/29/18 1751 06/30/18 0533 07/01/18 0633  AST 16 14* 12*  ALT 7 6 7   ALKPHOS 71 56 45  BILITOT 0.5 0.7 0.3  PROT 7.4 6.2* 4.7*  ALBUMIN 2.7* 2.3* 1.8*   No results for input(s): LIPASE, AMYLASE in the last 168 hours. No results for input(s): AMMONIA in the last 168 hours. Coagulation Profile: No results for input(s): INR, PROTIME in the last 168 hours. Cardiac Enzymes: Recent Labs  Lab 06/30/18 0730  CKTOTAL 68  TROPONINI <0.03   BNP (last 3 results) No results for input(s): PROBNP in the last 8760 hours. HbA1C: No results for input(s): HGBA1C in the last 72 hours. CBG: Recent Labs  Lab 07/04/18 0014 07/04/18 0449 07/04/18 0516 07/04/18 0754 07/04/18 1231  GLUCAP 101* 99  110* 117* 139*   Lipid Profile: No results for input(s): CHOL, HDL, LDLCALC, TRIG, CHOLHDL, LDLDIRECT in the last 72 hours. Thyroid Function Tests: No results for input(s): TSH, T4TOTAL, FREET4, T3FREE, THYROIDAB in the last 72 hours. Anemia Panel: No results for input(s): VITAMINB12, FOLATE, FERRITIN, TIBC, IRON, RETICCTPCT in the last 72 hours. Urine analysis:    Component Value Date/Time   COLORURINE YELLOW 06/29/2018 1911   APPEARANCEUR CLOUDY (A) 06/29/2018 1911   LABSPEC 1.012 06/29/2018 1911   PHURINE 5.0 06/29/2018 1911   GLUCOSEU NEGATIVE 06/29/2018 1911   HGBUR NEGATIVE 06/29/2018 1911   BILIRUBINUR NEGATIVE 06/29/2018 1911   KETONESUR NEGATIVE 06/29/2018 1911   PROTEINUR NEGATIVE 06/29/2018 1911   UROBILINOGEN 1.0 05/04/2015 1535   NITRITE NEGATIVE 06/29/2018 1911   LEUKOCYTESUR LARGE (A) 06/29/2018 1911   Sepsis Labs: @LABRCNTIP (procalcitonin:4,lacticidven:4)  ) Recent Results (from the past 240 hour(s))  Urine culture     Status: Abnormal   Collection Time:  06/29/18  6:05 PM  Result Value Ref Range Status   Specimen Description URINE, CLEAN CATCH  Final   Special Requests   Final    NONE Performed at [facility], [place] [address] [place], [place] [address]    Culture >=100,000 COLONIES/mL ESCHERICHIA COLI (A)  Final   Report Status 07/02/2018 FINAL  Final   Organism ID, Bacteria ESCHERICHIA COLI (A)  Final      Susceptibility   Escherichia coli - MIC*    AMPICILLIN >=32 RESISTANT Resistant     CEFAZOLIN 8 SENSITIVE Sensitive     CEFTRIAXONE <=1 SENSITIVE Sensitive     CIPROFLOXACIN >=4 RESISTANT Resistant     GENTAMICIN <=1 SENSITIVE Sensitive     IMIPENEM <=0.25 SENSITIVE Sensitive     NITROFURANTOIN <=16 SENSITIVE Sensitive     TRIMETH/SULFA <=20 SENSITIVE Sensitive     AMPICILLIN/SULBACTAM >=32 RESISTANT Resistant     PIP/TAZO 16 SENSITIVE Sensitive     Extended ESBL NEGATIVE Sensitive     * >=100,000 COLONIES/mL ESCHERICHIA COLI  MRSA PCR Screening     Status: Abnormal   Collection Time: 06/30/18  4:47 AM  Result Value Ref Range Status   MRSA by PCR POSITIVE (A) NEGATIVE Final    Comment:        The GeneXpert MRSA Assay (FDA approved for NASAL specimens only), is one component of a comprehensive MRSA colonization surveillance program. It is not intended to diagnose MRSA infection nor to guide or monitor treatment for MRSA infections. RESULT CALLED TO, READ BACK BY AND VERIFIED WITH: Alanson Puls RN 9:35 06/30/18 ([email or web address]) Performed at [facility], [place] [address] [place], [place] [address]   C difficile quick scan w PCR reflex     Status: None   Collection Time: 06/30/18  9:59 AM  Result Value Ref Range Status   C Diff antigen NEGATIVE NEGATIVE Final   C Diff toxin NEGATIVE NEGATIVE Final   C Diff interpretation No C. difficile detected.  Final    Comment: Performed at [facility], [place] [address], [place], [place] [address]      Studies: No results found.  Scheduled Meds: . aspirin  81 mg Oral  Daily  . carvedilol  6.25 mg Oral BID WC  . cefdinir  300 mg Oral Q12H  . Chlorhexidine Gluconate Cloth  6 each Topical Q0600  . cholecalciferol  1,000 Units Oral Daily  . donepezil  10 mg Oral QHS  . enoxaparin (LOVENOX) injection  40 mg Subcutaneous  L97I  . folic acid  1 mg Oral Daily  . furosemide  20 mg Intravenous Q12H  . loratadine  10 mg Oral Daily  . magnesium oxide  400 mg Oral Daily  . mirtazapine  3.75 mg Oral QHS  . mupirocin ointment  1 application Nasal BID  . sertraline  150 mg Oral QHS    Continuous Infusions: . sodium chloride 250 mL (07/02/18 2340)     LOS: 3 days     Annita Brod, MD [facility]  To reach me or the doctor on call, go to: [email or web address] Password [email or web address]  07/04/2018, 3:48 PM

## 2018-07-05 DIAGNOSIS — L89890 Pressure ulcer of other site, unstageable: Secondary | ICD-10-CM | POA: Diagnosis not present

## 2018-07-05 DIAGNOSIS — R4701 Aphasia: Secondary | ICD-10-CM | POA: Diagnosis not present

## 2018-07-05 DIAGNOSIS — R278 Other lack of coordination: Secondary | ICD-10-CM | POA: Diagnosis not present

## 2018-07-05 DIAGNOSIS — K559 Vascular disorder of intestine, unspecified: Secondary | ICD-10-CM | POA: Diagnosis not present

## 2018-07-05 DIAGNOSIS — M6281 Muscle weakness (generalized): Secondary | ICD-10-CM | POA: Diagnosis not present

## 2018-07-05 DIAGNOSIS — F0151 Vascular dementia with behavioral disturbance: Secondary | ICD-10-CM | POA: Diagnosis not present

## 2018-07-05 DIAGNOSIS — E1122 Type 2 diabetes mellitus with diabetic chronic kidney disease: Secondary | ICD-10-CM | POA: Diagnosis not present

## 2018-07-05 DIAGNOSIS — F0391 Unspecified dementia with behavioral disturbance: Secondary | ICD-10-CM | POA: Diagnosis not present

## 2018-07-05 DIAGNOSIS — I69951 Hemiplegia and hemiparesis following unspecified cerebrovascular disease affecting right dominant side: Secondary | ICD-10-CM | POA: Diagnosis not present

## 2018-07-05 DIAGNOSIS — L89610 Pressure ulcer of right heel, unstageable: Secondary | ICD-10-CM | POA: Diagnosis not present

## 2018-07-05 DIAGNOSIS — E872 Acidosis: Secondary | ICD-10-CM | POA: Diagnosis not present

## 2018-07-05 DIAGNOSIS — R2681 Unsteadiness on feet: Secondary | ICD-10-CM | POA: Diagnosis not present

## 2018-07-05 DIAGNOSIS — Z741 Need for assistance with personal care: Secondary | ICD-10-CM | POA: Diagnosis not present

## 2018-07-05 DIAGNOSIS — B351 Tinea unguium: Secondary | ICD-10-CM | POA: Diagnosis not present

## 2018-07-05 DIAGNOSIS — M79675 Pain in left toe(s): Secondary | ICD-10-CM | POA: Diagnosis not present

## 2018-07-05 DIAGNOSIS — R112 Nausea with vomiting, unspecified: Secondary | ICD-10-CM | POA: Diagnosis not present

## 2018-07-05 DIAGNOSIS — F331 Major depressive disorder, recurrent, moderate: Secondary | ICD-10-CM | POA: Diagnosis not present

## 2018-07-05 DIAGNOSIS — E1159 Type 2 diabetes mellitus with other circulatory complications: Secondary | ICD-10-CM | POA: Diagnosis not present

## 2018-07-05 DIAGNOSIS — R2689 Other abnormalities of gait and mobility: Secondary | ICD-10-CM | POA: Diagnosis not present

## 2018-07-05 DIAGNOSIS — I10 Essential (primary) hypertension: Secondary | ICD-10-CM | POA: Diagnosis not present

## 2018-07-05 DIAGNOSIS — L89620 Pressure ulcer of left heel, unstageable: Secondary | ICD-10-CM | POA: Diagnosis not present

## 2018-07-05 DIAGNOSIS — N390 Urinary tract infection, site not specified: Secondary | ICD-10-CM | POA: Diagnosis not present

## 2018-07-05 DIAGNOSIS — N183 Chronic kidney disease, stage 3 (moderate): Secondary | ICD-10-CM | POA: Diagnosis not present

## 2018-07-05 DIAGNOSIS — F339 Major depressive disorder, recurrent, unspecified: Secondary | ICD-10-CM | POA: Diagnosis not present

## 2018-07-05 DIAGNOSIS — M79674 Pain in right toe(s): Secondary | ICD-10-CM | POA: Diagnosis not present

## 2018-07-05 DIAGNOSIS — I63139 Cerebral infarction due to embolism of unspecified carotid artery: Secondary | ICD-10-CM | POA: Diagnosis not present

## 2018-07-05 LAB — CBC
HEMATOCRIT: 31.4 % — AB (ref 36.0–46.0)
HEMOGLOBIN: 10.1 g/dL — AB (ref 12.0–15.0)
MCH: 25.3 pg — AB (ref 26.0–34.0)
MCHC: 32.2 g/dL (ref 30.0–36.0)
MCV: 78.5 fL — ABNORMAL LOW (ref 80.0–100.0)
Platelets: 414 10*3/uL — ABNORMAL HIGH (ref 150–400)
RBC: 4 MIL/uL (ref 3.87–5.11)
RDW: 15.9 % — ABNORMAL HIGH (ref 11.5–15.5)
WBC: 13 10*3/uL — ABNORMAL HIGH (ref 4.0–10.5)
nRBC: 0 % (ref 0.0–0.2)

## 2018-07-05 LAB — BASIC METABOLIC PANEL
Anion gap: 8 (ref 5–15)
BUN: 9 mg/dL (ref 8–23)
CHLORIDE: 101 mmol/L (ref 98–111)
CO2: 31 mmol/L (ref 22–32)
Calcium: 8.7 mg/dL — ABNORMAL LOW (ref 8.9–10.3)
Creatinine, Ser: 1.21 mg/dL — ABNORMAL HIGH (ref 0.44–1.00)
GFR calc Af Amer: 47 mL/min — ABNORMAL LOW (ref >60)
GFR, EST NON AFRICAN AMERICAN: 40 mL/min — AB (ref >60)
GLUCOSE: 108 mg/dL — AB (ref 70–99)
POTASSIUM: 3.5 mmol/L (ref 3.5–5.1)
Sodium: 140 mmol/L (ref 135–145)

## 2018-07-05 LAB — GLUCOSE, CAPILLARY
GLUCOSE-CAPILLARY: 100 mg/dL — AB (ref 70–99)
Glucose-Capillary: 102 mg/dL — ABNORMAL HIGH (ref 70–99)
Glucose-Capillary: 113 mg/dL — ABNORMAL HIGH (ref 70–99)
Glucose-Capillary: 95 mg/dL (ref 70–99)

## 2018-07-05 LAB — LACTIC ACID, PLASMA: Lactic Acid, Venous: 1 mmol/L (ref 0.5–1.9)

## 2018-07-05 MED ORDER — LEVOFLOXACIN 500 MG PO TABS: 500.0000 mg | ORAL_TABLET | Freq: Every day | ORAL | 0 refills | Status: DC

## 2018-07-05 NOTE — Clinical Social Work Placement (Signed)
   CLINICAL SOCIAL WORK PLACEMENT  NOTE [facility] RN to call report to [phone]   Date:  07/05/2018  Patient Details  Name: Cathy Taylor MRN: [id number] Date of Birth: September 09, 1933  Clinical Social Work is seeking post-discharge placement for this patient at the [place] level of care (*CSW will initial, date and re-position this form in  chart as items are completed):  No   Patient/family provided with [place] Work Department's list of facilities offering this level of care within the geographic area requested by the patient (or if unable, by the patient's family).  Yes   Patient/family informed of their freedom to choose among providers that offer the needed level of care, that participate in Medicare, Medicaid or managed care program needed by the patient, have an available bed and are willing to accept the patient.  Yes   Patient/family informed of [facility]'s ownership interest in [facility] and [facility] as well as of the fact that they are under no obligation to receive care at these facilities.  PASRR submitted to EDS on       PASRR number received on       Existing PASRR number confirmed on 06/30/18     FL2 transmitted to all facilities in geographic area requested by pt/family on 06/30/18     FL2 transmitted to all facilities within larger geographic area on       Patient informed that his/her managed care company has contracts with or will negotiate with certain facilities, including the following:        Yes   Patient/family informed of bed offers received.  Patient chooses bed at [facility]     Physician recommends and patient chooses bed at      Patient to be transferred to [facility] on 07/05/18.  Patient to be transferred to facility by [facility]     Patient family notified on 07/05/18 of transfer.  Name of family member notified:  pt daughter [name] via telephone      PHYSICIAN Please prepare priority discharge summary, including medications, Please prepare prescriptions, Please sign DNR     Additional Comment:    _______________________________________________ Alexander Mt, LCSWA 07/05/2018, 3:46 PM

## 2018-07-05 NOTE — Plan of Care (Signed)

## 2018-07-05 NOTE — Progress Notes (Signed)
[name] to be D/C'd to [facility] per MD order.Report given to  [facility] (Nurse) and all questions fully answered.  VSS, Skin clean, dry and intact without evidence of skin break down, no evidence of skin tears noted.  IV catheter discontinued intact. Site without signs and symptoms of complications. Dressing and pressure applied.  An After Visit Summary was printed and given to [facility] with patient discharge orders.  Patient left via stretcher with [facility], no distress noted.

## 2018-07-05 NOTE — Progress Notes (Addendum)
Rechecked pt. blood pressure from previous reading. Pt. bp is now 129/73. No fluid bolus given at this time.  [name], RN 07/05/2018

## 2018-07-05 NOTE — Social Work (Signed)
Clinical Social Worker facilitated patient discharge including contacting patient family and facility to confirm patient discharge plans.  Clinical information faxed to facility and family agreeable with plan.  CSW arranged ambulance transport via [facility] to [place] RN to call [phone] with report  prior to discharge.  Clinical Social Worker will sign off for now as social work intervention is no longer needed. Please consult [place] again if new need arises.  Alexander Mt, [place] Social Worker

## 2018-07-05 NOTE — Social Work (Addendum)
CSW acknowledging discharge summary- per bedside RN pt daughter still asking to see MD. Will wait for confirmation that pt daughters aware of discharge before calling [facility]- all information complete and [facility] aware of pt return.  3:40pm- Spoke with pt daughter [name] via telephone. She is aware of discharge and that pt will be transported via [facility].  Westley Hummer, MSW, [place] Work [phone]

## 2018-07-05 NOTE — Progress Notes (Signed)
Pt. Blood pressure 87/47. Dr. Hal Hope notified. Orders were to recheck pt. BP and give fluid bolus if no improvement.  [name], RN 07/05/2018

## 2018-07-05 NOTE — Discharge Summary (Addendum)
Discharge Summary  Cathy Taylor ZMO:[id number] DOB: 02/23/1933  PCP: Maryella Shivers, MD  Admit date: 06/29/2018 Discharge date: 07/05/2018  Time spent: 35 minutes  Recommendations for Outpatient Follow-up:  1. Medication change: Requip discontinued 2. New medication: Levaquin 500 p.o. daily x3 days 3. Note: If patient has increased somnolence, discontinue Neurontin 4. Patient should have a basic metabolic panel and CBC rechecked on Thursday, 10/17.  Discharge Diagnoses:  Active Hospital Problems   Diagnosis Date Noted  . Nausea & vomiting 06/30/2018  . Metabolic acidosis 03/54/6568  . Colitis 07/02/2018  . Vascular dementia with behavior disturbance ([place]) 07/02/2018  . Nausea and vomiting 07/01/2018  . Acute lower UTI 06/30/2018  . Advanced care planning/counseling discussion   . Goals of care, counseling/discussion   . Palliative care by specialist   . Cardiomyopathy ([place]) 05/06/2015  . CVA (cerebral vascular accident) ([place])   . Memory loss 06/01/2014  . PMR (polymyalgia rheumatica) ([place]) 09/10/2011  . Hypertension 09/10/2011  . Diabetes mellitus ([place]) 09/10/2011    Resolved Hospital Problems   Diagnosis Date Noted Date Resolved  . Enteritis 06/30/2018 07/02/2018    Discharge Condition: Improved, being discharged back to skilled nursing  Diet recommendation: Low-sodium  Vitals:   07/05/18 0444 07/05/18 0528  BP: (!) 87/47 129/73  Pulse: 72   Resp: 20   Temp: 99 F (37.2 C)   SpO2: 98%     History of present illness:  84 year old female comes from a skilled nursing facility on 10/8 with past history of mild dementia status post CVA, polymyalgia rheumatica, diastolic dysfunction and brought in for worsening confusion over the past month.  Patient quite somnolent and found to have large UTI as well as signs consistent with ischemic versus infectious colitis plus acute kidney injury.  Started on IV Zosyn and IV fluids.    Hospital Course:    Assessment/Plan: Principal Problem:   Nausea & vomiting: Likely in the setting of colitis and/or UTI.  Improved, although I suspect she has some mild underlying nausea and poor p.o. intake from the p.o. antibiotics.  Has completed 5 days of antibiotics.     Her p.o. has improved somewhat.   Hypertension   Diabetes mellitus ([place]): Came in with hypoglycemia, likely due to infection and poor p.o. intake.  Initially on dextrose drip.  Received steroid stim test this morning which led to CBG of greater than 600.  Stopped dextrose and IV fluids given NovoLog coverage only which then led to repeat episode of hypoglycemia.  Had to add dextrose to patient's bicarb drip.  Now that she is awake and able to take p.o. and infection better treated, we discontinued dextrose and IV fluids and monitor CBGs every 4 hours.     CBGs for the past 48 hours have remained stable on their own  .   PMR (polymyalgia rheumatica) (HCC)   Memory loss   CVA (cerebral vascular accident) [facility]): Left-sided hemiplegia, wheelchair-bound   Cardiomyopathy [facility]): Monitoring input and output.   Status post placement of implantable loop recorder    Acute lower UTI: Urine cultures positive for Cipro resistant E. coli.  Changed to p.o. Omnicef.  Completed 5 days of antibiotics Colitis: Ischemic versus infectious.  Zosyn changed to p.o. Flagyl, completed 5 days of antibiotics.  Noted white count still elevated.  Have added 3 more days of p.o. Levaquin Senile dementia, mild with acute behavioral disturbance/acute metabolic encephalopathy: Secondary to infection and metabolic acidosis.:  Some improvement with treatment of infection and acidosis, although more  flattened affect today.  I also stopped her sedating medications: Neurontin and Requip.  Patient complains of some foot pain by day of discharge, so we will restart her Neurontin.  If she is more somnolent at the nursing facility, would recommend discontinuation of her  Neurontin  Overweight: Patient meets criteria with BMI greater than 25  Metabolic/respiratory acidosis: Given patient's somnolence, ABG checked.  Found to have pH of 7.18 with PCO2 of 52 and PO2 of 147 with bicarb of 19.  Signs consistent with a mild respiratory acidosis and non-anion gap metabolic acidosis.  Suspect the latter may be from diarrhea brought on by colitis.  Responded well to bicarb drip.     Drip discontinued by 10/12.  Hypomagnesemia/hypokalemia: Replaced as needed.  Started on magnesium oxide.  Acute respiratory failure with hypoxia: Secondary to volume overload.     Received Lasix and diuresed over a liter and a half of fluids.by day of discharge, creatinine slightly trending upward.  Lasix discontinued.  Able to be weaned off of oxygen.  Consultants:  None  Procedures:  None  Discharge Exam: BP 129/73 (BP Location: Left Arm)   Pulse 72   Temp 99 F (37.2 C) (Oral)   Resp 20   Ht 5\' 3"  (1.6 m)   Wt 63.6 kg   SpO2 98%   BMI 24.84 kg/m    General: Alert and oriented x2, no acute distress Cardiovascular: Regular rate and rhythm, S1-S2 Respiratory: Clear to auscultation bilaterally  Discharge Instructions You were cared for by a hospitalist during your hospital stay. If you have any questions about your discharge medications or the care you received while you were in the hospital after you are discharged, you can call the unit and asked to speak with the hospitalist on call if the hospitalist that took care of you is not available. Once you are discharged, your primary care physician will handle any further medical issues. Please note that NO REFILLS for any discharge medications will be authorized once you are discharged, as it is imperative that you return to your primary care physician (or establish a relationship with a primary care physician if you do not have one) for your aftercare needs so that they can reassess your need for medications and monitor your  lab values.  Discharge Instructions    Diet - low sodium heart healthy   Complete by:  As directed    Increase activity slowly   Complete by:  As directed      Allergies as of 07/05/2018   No Known Allergies     Medication List    STOP taking these medications   rOPINIRole 0.5 MG tablet Commonly known as:  REQUIP     TAKE these medications   acetaminophen 325 MG tablet Commonly known as:  TYLENOL Take 650 mg by mouth every 6 (six) hours as needed (for pain).   aspirin 81 MG tablet Take 1 tablet (81 mg total) by mouth daily.   carvedilol 6.25 MG tablet Commonly known as:  COREG Take 1 tablet (6.25 mg total) by mouth 2 (two) times daily with a meal.   donepezil 10 MG tablet Commonly known as:  ARICEPT Take 10 mg by mouth at bedtime.   folic acid 1 MG tablet Commonly known as:  FOLVITE Take 1 mg by mouth daily.   gabapentin 100 MG capsule Commonly known as:  NEURONTIN Take 200 mg by mouth 2 (two) times daily.   levofloxacin 500 MG tablet Commonly known as:  LEVAQUIN Take 1 tablet (500 mg total) by mouth daily.   lisinopril 5 MG tablet Commonly known as:  PRINIVIL,ZESTRIL Take 1 tablet (5 mg total) by mouth daily.   loratadine 10 MG tablet Commonly known as:  CLARITIN Take 10 mg by mouth daily.   metFORMIN 500 MG tablet Commonly known as:  GLUCOPHAGE Take 500 mg by mouth 2 (two) times daily.   mirtazapine 15 MG tablet Commonly known as:  REMERON Take 3.75 mg by mouth at bedtime.   NON FORMULARY Take 120 mLs by mouth See admin instructions. MedPass: Drink 120 ml's by mouth once a day   ondansetron 4 MG tablet Commonly known as:  ZOFRAN Take 4 mg by mouth every 8 (eight) hours as needed for nausea or vomiting.   sertraline 50 MG tablet Commonly known as:  ZOLOFT Take 150 mg by mouth at bedtime.   Vitamin D-3 1000 units Caps Take 1,000 Units by mouth daily.       No Known Allergies Contact information for after-discharge care    Destination     [facility] Preferred SNF .   Service:  Skilled Nursing Contact information: [address] [facility] [place] [phone]               The results of significant diagnostics from this hospitalization (including imaging, microbiology, ancillary and laboratory) are listed below for reference.    Significant Diagnostic Studies: Ct Abdomen Pelvis Wo Contrast  Result Date: 06/30/2018 CLINICAL DATA:  Lethargy, weakness, not eating or drinking, nausea and vomiting, weight loss for 3 weeks. Being treated for urinary tract infection. EXAM: CT ABDOMEN AND PELVIS WITHOUT CONTRAST TECHNIQUE: Multidetector CT imaging of the abdomen and pelvis was performed following the standard protocol without IV contrast. COMPARISON:  None. FINDINGS: Lower chest: Mild fibrosis in the lung bases. Small esophageal hiatal hernia. Coronary artery calcifications. Hepatobiliary: Stones and sludge in the gallbladder. No gallbladder wall thickening or infiltration. No bile duct dilatation. No focal liver lesions. Pancreas: Unremarkable. No pancreatic ductal dilatation or surrounding inflammatory changes. Spleen: Normal in size without focal abnormality. Adrenals/Urinary Tract: Adrenal glands are unremarkable. Kidneys are normal, without renal calculi, focal lesion, or hydronephrosis. Bladder is unremarkable. Stomach/Bowel: Stomach, small bowel, and colon are not abnormally distended. Contrast material flows through to the colon without evidence of obstruction. There is evidence of some bowel wall and fold thickening in the distal ileum which could indicate enteritis, either infectious or inflammatory. Focal thickening of the cecal wall is demonstrated without contrast opacification in otherwise opacified cecum. A cecal mass is suspected. Vascular/Lymphatic: Extensive vascular calcifications. No aortic aneurysm. No significant lymphadenopathy. Prominent calcifications in the abdominal aorta at  the hiatus above the level of the celiac axis likely indicates significant focal stenosis. Reproductive: Uterus and bilateral adnexa are unremarkable. Other: No free air or free fluid in the abdomen. Abdominal wall musculature appears intact. Musculoskeletal: Degenerative changes in the lumbar spine. No destructive bone lesions. IMPRESSION: 1. Bowel wall thickening in the terminal ileum suggesting enteritis which could be infectious or inflammatory. No obstruction. 2. Focal thickening of the cecal wall suspicious for a cecal mass. Consider colonoscopy in follow-up. 3. Cholelithiasis without evidence of cholecystitis. 4. Extensive vascular calcifications. Probable calcific stenosis of the proximal abdominal aorta. Electronically Signed   By: Lucienne Capers M.D.   On: 06/30/2018 01:33   Ct Head Wo Contrast  Result Date: 06/30/2018 CLINICAL DATA:  Altered mental status. Decreased appetite. Weakness and weight loss. EXAM: CT HEAD WITHOUT  CONTRAST TECHNIQUE: Contiguous axial images were obtained from the base of the skull through the vertex without intravenous contrast. COMPARISON:  Head CT 09/03/2015 FINDINGS: Brain: There is no mass, hemorrhage or extra-axial collection. There is generalized atrophy without lobar predilection. Old left basal ganglia and right cerebellar infarcts. There is hypoattenuation of the periventricular white matter, most commonly indicating chronic ischemic microangiopathy. Vascular: Atherosclerotic calcification of the internal carotid arteries at the skull base. No abnormal hyperdensity of the major intracranial arteries or dural venous sinuses. Skull: The visualized skull base, calvarium and extracranial soft tissues are normal. Sinuses/Orbits: Partial opacification of the left mastoid air cells. No paranasal sinus fluid levels or mucosal thickening. The orbits are normal. IMPRESSION: 1. No acute abnormality. 2. Old right cerebellar infarct and findings of severe chronic ischemic  microangiopathy, which have progressed since 09/03/2015. 3. Generalized atrophy. Electronically Signed   By: Ulyses Jarred M.D.   On: 06/30/2018 01:26   Dg Chest Port 1 View  Result Date: 07/03/2018 CLINICAL DATA:  Hypoxia. History of hypertension, diabetes, bronchiectasis, stroke. EXAM: PORTABLE CHEST 1 VIEW COMPARISON:  Chest x-ray dated 02/08/2016. FINDINGS: Mild cardiomegaly is stable. Atherosclerotic changes again noted at the aortic arch. Loop recorder device is again appreciated over the LEFT heart shadow. Increased opacity at the LEFT lung base, compatible with consolidation and probable small pleural effusion. RIGHT lung is clear. No pneumothorax seen. No acute or suspicious osseous finding. Advanced degenerative change again noted at the RIGHT shoulder. Status post LEFT shoulder arthroplasty. IMPRESSION: 1. New opacity at the LEFT lung base, pneumonia versus atelectasis, and probable small LEFT pleural effusion. 2. Stable mild cardiomegaly. 3.  Aortic Atherosclerosis (ICD10-I70.0). Electronically Signed   By: Franki Cabot M.D.   On: 07/03/2018 10:54    Microbiology: Recent Results (from the past 240 hour(s))  Urine culture     Status: Abnormal   Collection Time: 06/29/18  6:05 PM  Result Value Ref Range Status   Specimen Description URINE, CLEAN CATCH  Final   Special Requests   Final    NONE Performed at [facility], [place] [address] [place], [place] [address]    Culture >=100,000 COLONIES/mL ESCHERICHIA COLI (A)  Final   Report Status 07/02/2018 FINAL  Final   Organism ID, Bacteria ESCHERICHIA COLI (A)  Final      Susceptibility   Escherichia coli - MIC*    AMPICILLIN >=32 RESISTANT Resistant     CEFAZOLIN 8 SENSITIVE Sensitive     CEFTRIAXONE <=1 SENSITIVE Sensitive     CIPROFLOXACIN >=4 RESISTANT Resistant     GENTAMICIN <=1 SENSITIVE Sensitive     IMIPENEM <=0.25 SENSITIVE Sensitive     NITROFURANTOIN <=16 SENSITIVE Sensitive     TRIMETH/SULFA <=20 SENSITIVE  Sensitive     AMPICILLIN/SULBACTAM >=32 RESISTANT Resistant     PIP/TAZO 16 SENSITIVE Sensitive     Extended ESBL NEGATIVE Sensitive     * >=100,000 COLONIES/mL ESCHERICHIA COLI  MRSA PCR Screening     Status: Abnormal   Collection Time: 06/30/18  4:47 AM  Result Value Ref Range Status   MRSA by PCR POSITIVE (A) NEGATIVE Final    Comment:        The GeneXpert MRSA Assay (FDA approved for NASAL specimens only), is one component of a comprehensive MRSA colonization surveillance program. It is not intended to diagnose MRSA infection nor to guide or monitor treatment for MRSA infections. RESULT CALLED TO, READ BACK BY AND VERIFIED WITH: Alanson Puls RN 9:35 06/30/18 ([email or web address])  Performed at [facility], [place] [address] [place], [place] [address]   C difficile quick scan w PCR reflex     Status: None   Collection Time: 06/30/18  9:59 AM  Result Value Ref Range Status   C Diff antigen NEGATIVE NEGATIVE Final   C Diff toxin NEGATIVE NEGATIVE Final   C Diff interpretation No C. difficile detected.  Final    Comment: Performed at [facility], [place] [address] [place], [place] [address]     Labs: Basic Metabolic Panel: Recent Labs  Lab 06/30/18 0533 06/30/18 0715 07/01/18 5573 07/02/18 0946 07/03/18 0447 07/04/18 0912 07/05/18 0552  NA 130*  --  140 137 139 139 140  K 3.9  --  3.4* 3.4* 3.1* 4.1 3.5  CL 103  --  119* 110 109 107 101  CO2 17*  --  16* 20* 21* 22 31  GLUCOSE 186*  --  665* 124* 83 100* 108*  BUN 21  --  10 8 7* 8 9  CREATININE 1.43*  --  1.18* 1.17* 1.20* 1.07* 1.21*  CALCIUM 7.8*  --  6.9* 8.0* 8.3* 8.5* 8.7*  MG REORDERING, ADD ON SAMPLE WAS QUESTIONABLE RESULTS FROM EARLIER 1.1* 1.4*  --  1.7 1.6*  --    Liver Function Tests: Recent Labs  Lab 06/29/18 1751 06/30/18 0533 07/01/18 0633  AST 16 14* 12*  ALT 7 6 7   ALKPHOS 71 56 45  BILITOT 0.5 0.7 0.3  PROT 7.4 6.2* 4.7*  ALBUMIN 2.7* 2.3* 1.8*   No results for input(s): LIPASE, AMYLASE in  the last 168 hours. No results for input(s): AMMONIA in the last 168 hours. CBC: Recent Labs  Lab 06/29/18 1751  07/01/18 1117 07/02/18 0946 07/03/18 0447 07/04/18 0912 07/05/18 0552  WBC 13.2*   < > 12.9* 11.6* 11.7* 12.7* 13.0*  NEUTROABS 8.9*  --   --   --   --   --   --   HGB 11.1*   < > 8.9* 8.9* 9.3* 9.4* 10.1*  HCT 37.2   < > 30.0* 29.7* 29.4* 29.5* 31.4*  MCV 81.8   < > 81.7 82.0 79.9* 79.1* 78.5*  PLT 561*   < > 398 379 392 401* 414*   < > = values in this interval not displayed.   Cardiac Enzymes: Recent Labs  Lab 06/30/18 0730  CKTOTAL 60  TROPONINI <0.03   BNP: BNP (last 3 results) Recent Labs    07/03/18 0447  BNP 733.7*    ProBNP (last 3 results) No results for input(s): PROBNP in the last 8760 hours.  CBG: Recent Labs  Lab 07/04/18 2023 07/05/18 0021 07/05/18 0425 07/05/18 0740 07/05/18 1248  GLUCAP 83 113* 95 102* 100*       Signed:  Annita Brod, MD [facility] 07/05/2018, 2:06 PM

## 2018-07-09 DIAGNOSIS — I10 Essential (primary) hypertension: Secondary | ICD-10-CM | POA: Diagnosis not present

## 2018-07-09 DIAGNOSIS — E1159 Type 2 diabetes mellitus with other circulatory complications: Secondary | ICD-10-CM | POA: Diagnosis not present

## 2018-07-09 DIAGNOSIS — E1122 Type 2 diabetes mellitus with diabetic chronic kidney disease: Secondary | ICD-10-CM | POA: Diagnosis not present

## 2018-07-09 DIAGNOSIS — N183 Chronic kidney disease, stage 3 (moderate): Secondary | ICD-10-CM | POA: Diagnosis not present

## 2018-07-16 ENCOUNTER — Ambulatory Visit (INDEPENDENT_AMBULATORY_CARE_PROVIDER_SITE_OTHER): Payer: Medicare Other | Admitting: *Deleted

## 2018-07-16 DIAGNOSIS — I429 Cardiomyopathy, unspecified: Secondary | ICD-10-CM

## 2018-07-16 DIAGNOSIS — I63139 Cerebral infarction due to embolism of unspecified carotid artery: Secondary | ICD-10-CM

## 2018-07-16 NOTE — Progress Notes (Signed)
[facility] Summary Report / Loop Recorder 

## 2018-07-30 LAB — CUP PACEART REMOTE DEVICE CHECK
Date Time Interrogation Session: 20191025104106
MDC IDC PG IMPLANT DT: 20160816

## 2018-08-17 DIAGNOSIS — F331 Major depressive disorder, recurrent, moderate: Secondary | ICD-10-CM | POA: Diagnosis not present

## 2018-08-17 DIAGNOSIS — R112 Nausea with vomiting, unspecified: Secondary | ICD-10-CM | POA: Diagnosis not present

## 2018-08-17 DIAGNOSIS — N183 Chronic kidney disease, stage 3 (moderate): Secondary | ICD-10-CM | POA: Diagnosis not present

## 2018-08-17 DIAGNOSIS — E1122 Type 2 diabetes mellitus with diabetic chronic kidney disease: Secondary | ICD-10-CM | POA: Diagnosis not present

## 2018-08-18 ENCOUNTER — Ambulatory Visit (INDEPENDENT_AMBULATORY_CARE_PROVIDER_SITE_OTHER): Payer: Medicare Other

## 2018-08-18 DIAGNOSIS — I429 Cardiomyopathy, unspecified: Secondary | ICD-10-CM

## 2018-08-18 DIAGNOSIS — I63139 Cerebral infarction due to embolism of unspecified carotid artery: Secondary | ICD-10-CM

## 2018-08-18 NOTE — Progress Notes (Signed)
[facility] Summary Report / Loop Recorder 

## 2018-08-24 ENCOUNTER — Observation Stay (HOSPITAL_COMMUNITY): Payer: Medicare Other

## 2018-08-24 ENCOUNTER — Emergency Department (HOSPITAL_COMMUNITY): Payer: Medicare Other

## 2018-08-24 ENCOUNTER — Inpatient Hospital Stay (HOSPITAL_COMMUNITY)
Admission: EM | Admit: 2018-08-24 | Discharge: 2018-09-07 | DRG: 377 | Disposition: A | Discharge status: Skilled Nursing Facility | Payer: Medicare Other | Attending: Internal Medicine | Admitting: Internal Medicine

## 2018-08-24 DIAGNOSIS — E785 Hyperlipidemia, unspecified: Secondary | ICD-10-CM | POA: Diagnosis present

## 2018-08-24 DIAGNOSIS — K8020 Calculus of gallbladder without cholecystitis without obstruction: Secondary | ICD-10-CM | POA: Diagnosis not present

## 2018-08-24 DIAGNOSIS — E1122 Type 2 diabetes mellitus with diabetic chronic kidney disease: Secondary | ICD-10-CM | POA: Diagnosis present

## 2018-08-24 DIAGNOSIS — F329 Major depressive disorder, single episode, unspecified: Secondary | ICD-10-CM | POA: Diagnosis present

## 2018-08-24 DIAGNOSIS — D649 Anemia, unspecified: Secondary | ICD-10-CM | POA: Diagnosis present

## 2018-08-24 DIAGNOSIS — G8929 Other chronic pain: Secondary | ICD-10-CM | POA: Diagnosis present

## 2018-08-24 DIAGNOSIS — D631 Anemia in chronic kidney disease: Secondary | ICD-10-CM | POA: Diagnosis present

## 2018-08-24 DIAGNOSIS — D72829 Elevated white blood cell count, unspecified: Secondary | ICD-10-CM | POA: Diagnosis not present

## 2018-08-24 DIAGNOSIS — K2980 Duodenitis without bleeding: Secondary | ICD-10-CM | POA: Diagnosis present

## 2018-08-24 DIAGNOSIS — R627 Adult failure to thrive: Secondary | ICD-10-CM | POA: Diagnosis not present

## 2018-08-24 DIAGNOSIS — K2210 Ulcer of esophagus without bleeding: Secondary | ICD-10-CM | POA: Diagnosis present

## 2018-08-24 DIAGNOSIS — I10 Essential (primary) hypertension: Secondary | ICD-10-CM | POA: Diagnosis present

## 2018-08-24 DIAGNOSIS — R0902 Hypoxemia: Secondary | ICD-10-CM

## 2018-08-24 DIAGNOSIS — K8689 Other specified diseases of pancreas: Secondary | ICD-10-CM | POA: Diagnosis present

## 2018-08-24 DIAGNOSIS — Z87891 Personal history of nicotine dependence: Secondary | ICD-10-CM

## 2018-08-24 DIAGNOSIS — D500 Iron deficiency anemia secondary to blood loss (chronic): Secondary | ICD-10-CM | POA: Diagnosis present

## 2018-08-24 DIAGNOSIS — R0602 Shortness of breath: Secondary | ICD-10-CM

## 2018-08-24 DIAGNOSIS — F419 Anxiety disorder, unspecified: Secondary | ICD-10-CM | POA: Diagnosis present

## 2018-08-24 DIAGNOSIS — Z7982 Long term (current) use of aspirin: Secondary | ICD-10-CM

## 2018-08-24 DIAGNOSIS — R112 Nausea with vomiting, unspecified: Secondary | ICD-10-CM

## 2018-08-24 DIAGNOSIS — E162 Hypoglycemia, unspecified: Secondary | ICD-10-CM | POA: Diagnosis present

## 2018-08-24 DIAGNOSIS — N179 Acute kidney failure, unspecified: Secondary | ICD-10-CM | POA: Diagnosis present

## 2018-08-24 DIAGNOSIS — L8990 Pressure ulcer of unspecified site, unspecified stage: Secondary | ICD-10-CM

## 2018-08-24 DIAGNOSIS — F0390 Unspecified dementia without behavioral disturbance: Secondary | ICD-10-CM | POA: Diagnosis not present

## 2018-08-24 DIAGNOSIS — I130 Hypertensive heart and chronic kidney disease with heart failure and stage 1 through stage 4 chronic kidney disease, or unspecified chronic kidney disease: Secondary | ICD-10-CM | POA: Diagnosis present

## 2018-08-24 DIAGNOSIS — R6251 Failure to thrive (child): Secondary | ICD-10-CM

## 2018-08-24 DIAGNOSIS — Z961 Presence of intraocular lens: Secondary | ICD-10-CM | POA: Diagnosis present

## 2018-08-24 DIAGNOSIS — E119 Type 2 diabetes mellitus without complications: Secondary | ICD-10-CM

## 2018-08-24 DIAGNOSIS — Z66 Do not resuscitate: Secondary | ICD-10-CM | POA: Diagnosis present

## 2018-08-24 DIAGNOSIS — R17 Unspecified jaundice: Secondary | ICD-10-CM | POA: Diagnosis present

## 2018-08-24 DIAGNOSIS — Z823 Family history of stroke: Secondary | ICD-10-CM

## 2018-08-24 DIAGNOSIS — E876 Hypokalemia: Secondary | ICD-10-CM | POA: Diagnosis present

## 2018-08-24 DIAGNOSIS — Z79899 Other long term (current) drug therapy: Secondary | ICD-10-CM

## 2018-08-24 DIAGNOSIS — Z9010 Acquired absence of unspecified breast and nipple: Secondary | ICD-10-CM

## 2018-08-24 DIAGNOSIS — M353 Polymyalgia rheumatica: Secondary | ICD-10-CM | POA: Diagnosis present

## 2018-08-24 DIAGNOSIS — L89610 Pressure ulcer of right heel, unstageable: Secondary | ICD-10-CM | POA: Diagnosis present

## 2018-08-24 DIAGNOSIS — Z96612 Presence of left artificial shoulder joint: Secondary | ICD-10-CM | POA: Diagnosis present

## 2018-08-24 DIAGNOSIS — K219 Gastro-esophageal reflux disease without esophagitis: Secondary | ICD-10-CM | POA: Diagnosis present

## 2018-08-24 DIAGNOSIS — F0151 Vascular dementia with behavioral disturbance: Secondary | ICD-10-CM | POA: Diagnosis present

## 2018-08-24 DIAGNOSIS — L89620 Pressure ulcer of left heel, unstageable: Secondary | ICD-10-CM | POA: Diagnosis present

## 2018-08-24 DIAGNOSIS — Z8673 Personal history of transient ischemic attack (TIA), and cerebral infarction without residual deficits: Secondary | ICD-10-CM

## 2018-08-24 DIAGNOSIS — I5033 Acute on chronic diastolic (congestive) heart failure: Secondary | ICD-10-CM | POA: Diagnosis present

## 2018-08-24 DIAGNOSIS — E872 Acidosis: Secondary | ICD-10-CM | POA: Diagnosis present

## 2018-08-24 DIAGNOSIS — Z8249 Family history of ischemic heart disease and other diseases of the circulatory system: Secondary | ICD-10-CM

## 2018-08-24 DIAGNOSIS — N183 Chronic kidney disease, stage 3 unspecified: Secondary | ICD-10-CM | POA: Diagnosis present

## 2018-08-24 DIAGNOSIS — K254 Chronic or unspecified gastric ulcer with hemorrhage: Secondary | ICD-10-CM | POA: Diagnosis not present

## 2018-08-24 DIAGNOSIS — E11649 Type 2 diabetes mellitus with hypoglycemia without coma: Secondary | ICD-10-CM | POA: Diagnosis present

## 2018-08-24 DIAGNOSIS — J9601 Acute respiratory failure with hypoxia: Secondary | ICD-10-CM | POA: Diagnosis not present

## 2018-08-24 DIAGNOSIS — R5381 Other malaise: Secondary | ICD-10-CM | POA: Diagnosis present

## 2018-08-24 DIAGNOSIS — E43 Unspecified severe protein-calorie malnutrition: Secondary | ICD-10-CM | POA: Diagnosis present

## 2018-08-24 DIAGNOSIS — K449 Diaphragmatic hernia without obstruction or gangrene: Secondary | ICD-10-CM | POA: Diagnosis present

## 2018-08-24 DIAGNOSIS — Z853 Personal history of malignant neoplasm of breast: Secondary | ICD-10-CM

## 2018-08-24 DIAGNOSIS — I428 Other cardiomyopathies: Secondary | ICD-10-CM | POA: Diagnosis present

## 2018-08-24 DIAGNOSIS — Z9842 Cataract extraction status, left eye: Secondary | ICD-10-CM

## 2018-08-24 DIAGNOSIS — Z6822 Body mass index (BMI) 22.0-22.9, adult: Secondary | ICD-10-CM

## 2018-08-24 LAB — COMPREHENSIVE METABOLIC PANEL
ALK PHOS: 50 U/L (ref 38–126)
ALT: 10 U/L (ref 0–44)
AST: 24 U/L (ref 15–41)
Albumin: 2.5 g/dL — ABNORMAL LOW (ref 3.5–5.0)
Anion gap: 16 — ABNORMAL HIGH (ref 5–15)
BILIRUBIN TOTAL: 1.3 mg/dL — AB (ref 0.3–1.2)
BUN: 13 mg/dL (ref 8–23)
CALCIUM: 9.3 mg/dL (ref 8.9–10.3)
CHLORIDE: 103 mmol/L (ref 98–111)
CO2: 20 mmol/L — ABNORMAL LOW (ref 22–32)
Creatinine, Ser: 1.4 mg/dL — ABNORMAL HIGH (ref 0.44–1.00)
GFR, EST AFRICAN AMERICAN: 40 mL/min — AB (ref >60)
GFR, EST NON AFRICAN AMERICAN: 35 mL/min — AB (ref >60)
Glucose, Bld: 102 mg/dL — ABNORMAL HIGH (ref 70–99)
Potassium: 3.8 mmol/L (ref 3.5–5.1)
Sodium: 139 mmol/L (ref 135–145)
Total Protein: 7.5 g/dL (ref 6.5–8.1)

## 2018-08-24 LAB — CBC WITH DIFFERENTIAL/PLATELET
ABS IMMATURE GRANULOCYTES: 0.07 10*3/uL (ref 0.00–0.07)
Basophils Absolute: 0.1 10*3/uL (ref 0.0–0.1)
Basophils Relative: 1 %
EOS ABS: 0 10*3/uL (ref 0.0–0.5)
Eosinophils Relative: 0 %
HCT: 36.6 % (ref 36.0–46.0)
HEMOGLOBIN: 11.1 g/dL — AB (ref 12.0–15.0)
IMMATURE GRANULOCYTES: 1 %
LYMPHS ABS: 4.7 10*3/uL — AB (ref 0.7–4.0)
LYMPHS PCT: 36 %
MCH: 24.7 pg — AB (ref 26.0–34.0)
MCHC: 30.3 g/dL (ref 30.0–36.0)
MCV: 81.3 fL (ref 80.0–100.0)
MONOS PCT: 9 %
Monocytes Absolute: 1.1 10*3/uL — ABNORMAL HIGH (ref 0.1–1.0)
NEUTROS PCT: 53 %
Neutro Abs: 7 10*3/uL (ref 1.7–7.7)
Platelets: 407 10*3/uL — ABNORMAL HIGH (ref 150–400)
RBC: 4.5 MIL/uL (ref 3.87–5.11)
RDW: 22.8 % — ABNORMAL HIGH (ref 11.5–15.5)
SMEAR REVIEW: ADEQUATE
WBC: 13 10*3/uL — ABNORMAL HIGH (ref 4.0–10.5)
nRBC: 1.5 % — ABNORMAL HIGH (ref 0.0–0.2)

## 2018-08-24 LAB — I-STAT TROPONIN, ED: TROPONIN I, POC: 0.03 ng/mL (ref 0.00–0.08)

## 2018-08-24 LAB — PROTIME-INR
INR: 1.22
Prothrombin Time: 15.3 seconds — ABNORMAL HIGH (ref 11.4–15.2)

## 2018-08-24 LAB — I-STAT CG4 LACTIC ACID, ED
Lactic Acid, Venous: 4.37 mmol/L (ref 0.5–1.9)
Lactic Acid, Venous: 3.88 mmol/L (ref 0.5–1.9)

## 2018-08-24 LAB — LACTIC ACID, PLASMA: Lactic Acid, Venous: 1.1 mmol/L (ref 0.5–1.9)

## 2018-08-24 LAB — POC OCCULT BLOOD, ED: FECAL OCCULT BLD: NEGATIVE

## 2018-08-24 LAB — LIPASE, BLOOD: LIPASE: 48 U/L (ref 11–51)

## 2018-08-24 MED ORDER — FENOFIBRATE 160 MG PO TABS
160.0000 mg | ORAL_TABLET | Freq: Every day | ORAL | Status: DC
  Administered 2018-08-24: 160 mg via ORAL
  Filled 2018-08-24 (×3): qty 1

## 2018-08-24 MED ORDER — ASPIRIN EC 81 MG PO TBEC
81.0000 mg | DELAYED_RELEASE_TABLET | Freq: Every day | ORAL | Status: DC
  Administered 2018-08-24: 81 mg via ORAL
  Filled 2018-08-24 (×2): qty 1

## 2018-08-24 MED ORDER — SODIUM CHLORIDE 0.9 % IV SOLN: 1000.0000 mL | INTRAVENOUS | Status: DC

## 2018-08-24 MED ORDER — LORATADINE 10 MG PO TABS
10.0000 mg | ORAL_TABLET | Freq: Every day | ORAL | Status: DC
  Administered 2018-08-24: 10 mg via ORAL
  Filled 2018-08-24 (×2): qty 1

## 2018-08-24 MED ORDER — LACTATED RINGERS IV BOLUS
500.0000 mL | Freq: Once | INTRAVENOUS | Status: AC
  Administered 2018-08-24: 500 mL via INTRAVENOUS

## 2018-08-24 MED ORDER — VITAMIN C 500 MG PO TABS
500.0000 mg | ORAL_TABLET | Freq: Every day | ORAL | Status: DC
  Administered 2018-08-24: 500 mg via ORAL
  Filled 2018-08-24 (×2): qty 1

## 2018-08-24 MED ORDER — ESCITALOPRAM OXALATE 10 MG PO TABS
10.0000 mg | ORAL_TABLET | Freq: Every day | ORAL | Status: DC
  Administered 2018-08-24 – 2018-08-31 (×7): 10 mg via ORAL
  Filled 2018-08-24 (×8): qty 1

## 2018-08-24 MED ORDER — LACTATED RINGERS IV SOLN
INTRAVENOUS | Status: DC
  Administered 2018-08-24: 1000 mL via INTRAVENOUS

## 2018-08-24 MED ORDER — VITAMIN D-3 25 MCG (1000 UT) PO CAPS: 1000.0000 [IU] | ORAL_CAPSULE | Freq: Every day | ORAL | Status: DC

## 2018-08-24 MED ORDER — ZINC SULFATE 220 (50 ZN) MG PO CAPS
220.0000 mg | ORAL_CAPSULE | Freq: Every day | ORAL | Status: DC
  Administered 2018-08-24: 220 mg via ORAL
  Filled 2018-08-24 (×3): qty 1

## 2018-08-24 MED ORDER — SODIUM CHLORIDE 0.9 % IV BOLUS (SEPSIS)
500.0000 mL | Freq: Once | INTRAVENOUS | Status: AC
  Administered 2018-08-24: 500 mL via INTRAVENOUS

## 2018-08-24 MED ORDER — PROMETHAZINE HCL 25 MG/ML IJ SOLN
6.2500 mg | Freq: Four times a day (QID) | INTRAMUSCULAR | Status: DC | PRN
  Administered 2018-08-24: 6.25 mg via INTRAVENOUS
  Filled 2018-08-24: qty 1

## 2018-08-24 MED ORDER — IOHEXOL 300 MG/ML  SOLN
100.0000 mL | Freq: Once | INTRAMUSCULAR | Status: AC | PRN
  Administered 2018-08-24: 100 mL via INTRAVENOUS

## 2018-08-24 MED ORDER — GABAPENTIN 100 MG PO CAPS
200.0000 mg | ORAL_CAPSULE | Freq: Three times a day (TID) | ORAL | Status: DC
  Administered 2018-08-24: 200 mg via ORAL
  Filled 2018-08-24: qty 2

## 2018-08-24 MED ORDER — MIRTAZAPINE 7.5 MG PO TABS
3.7500 mg | ORAL_TABLET | Freq: Every day | ORAL | Status: DC
  Administered 2018-08-24 – 2018-08-30 (×6): 3.75 mg via ORAL
  Filled 2018-08-24 (×7): qty 1

## 2018-08-24 MED ORDER — VITAMIN D 25 MCG (1000 UNIT) PO TABS
1000.0000 [IU] | ORAL_TABLET | Freq: Every day | ORAL | Status: DC
  Administered 2018-08-24: 1000 [IU] via ORAL
  Filled 2018-08-24 (×2): qty 1

## 2018-08-24 MED ORDER — ACETAMINOPHEN 325 MG PO TABS
650.0000 mg | ORAL_TABLET | Freq: Four times a day (QID) | ORAL | Status: DC | PRN
  Administered 2018-08-27 – 2018-08-31 (×4): 650 mg via ORAL
  Filled 2018-08-24 (×5): qty 2

## 2018-08-24 MED ORDER — ACETAMINOPHEN 650 MG RE SUPP: 650.0000 mg | Freq: Four times a day (QID) | RECTAL | Status: DC | PRN

## 2018-08-24 MED ORDER — DONEPEZIL HCL 10 MG PO TABS
10.0000 mg | ORAL_TABLET | Freq: Every day | ORAL | Status: DC
  Administered 2018-08-24 – 2018-08-30 (×6): 10 mg via ORAL
  Filled 2018-08-24: qty 1
  Filled 2018-08-24: qty 2
  Filled 2018-08-24 (×6): qty 1

## 2018-08-24 MED ORDER — FOLIC ACID 1 MG PO TABS
1.0000 mg | ORAL_TABLET | Freq: Every day | ORAL | Status: DC
  Administered 2018-08-24: 1 mg via ORAL
  Filled 2018-08-24 (×2): qty 1

## 2018-08-24 MED ORDER — ENOXAPARIN SODIUM 30 MG/0.3ML ~~LOC~~ SOLN
30.0000 mg | SUBCUTANEOUS | Status: DC
  Administered 2018-08-24 – 2018-08-26 (×2): 30 mg via SUBCUTANEOUS
  Filled 2018-08-24 (×2): qty 0.3

## 2018-08-24 NOTE — ED Notes (Signed)
RN attempted several times for Lactic acid draw; RN call Phlembotomy for lab draw-Monique,RN

## 2018-08-24 NOTE — ED Notes (Signed)
Patient transported to CT 

## 2018-08-24 NOTE — ED Notes (Signed)
Patient transported to X-ray 

## 2018-08-24 NOTE — ED Provider Notes (Signed)
[facility] EMERGENCY DEPARTMENT Provider Note   CSN: [id number] Arrival date & time: 08/24/18  1453     History   Chief Complaint Chief Complaint  Patient presents with  . Weight Loss  . Emesis    HPI Cathy Taylor is a 84 y.o. female.  HPI Patient reports she has loss of appetite and is not eating.  She denies that she is having any pain.  There is report of unspecified amount of weight loss over the past month.  Patient was seen by Dr. Paulita Fujita today and had concern for possible obstruction notably because of October CT scan showing a focal thickening in the cecal wall suspicious for cecal mass.  Patient was referred to the emergency department.  I have read Dr. Erlinda Hong note in the patient's file.  Recommendation is for admission to hospitalist service and consultation to gastroenterology for further diagnostic evaluation.  Family members expressed much concern after Dr. Erlinda Hong evaluation.  They are concerned for something that they report was never mentioned to them or was missed during her evaluation and hospitalization for colitis in October.  Family members reports that the patient lives in the nursing home and they do not have extensive additional information for her history other than that she has not seemed to be eating well for a few months.  They reports that she has been nonambulatory since her stroke. Past Medical History:  Diagnosis Date  . Abdominal hernia   . Anxiety   . Arthritis   . Breast cancer ([place]) 1973   right  . 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 Bronchiectasis 11/12  . Bronchitis       . Chronic back pain greater than 3 months duration   . Depression   . Enteritis 06/30/2018  . GERD (gastroesophageal reflux disease)   . Heart murmur   . Hypercholesteremia   . Hypertension   . Left ventricular dysfunction 04/2015   EF 30% by TEE, + AK mid-apical anteroseptal and anterior walls plus apex, neg bubble study, grade III plaque asc Ao,  suspect ICM  . Osteoarthritis of left shoulder 01/06/2012  . PMR (polymyalgia rheumatica) (HCC)   . Stress fracture of left femur with nonunion 07/08/2011  . Stroke ([place])   . Type II diabetes mellitus ([place])   . Umbilical hernia 4/00/86   unrepaired    Patient Active Problem List   Diagnosis Date Noted  . Failure to thrive in adult 08/24/2018  . Vascular dementia with behavior disturbance ([place]) 07/02/2018  . Advanced care planning/counseling discussion   . Goals of care, counseling/discussion   . Palliative care by specialist   . History of stroke 10/21/2017  . Hyperlipidemia 10/21/2017  . Status post placement of implantable loop recorder 04/08/2017  . Debility   . Acute encephalopathy   . Depression 09/03/2015  . Right leg weakness 09/03/2015  . Aphasia 09/03/2015  . Nausea 09/03/2015  . Hypokalemia 09/03/2015  . Stroke (cerebrum) ([place]) 09/03/2015  . Slurred speech   . Elevated troponin 05/06/2015  . Cardiomyopathy ([place]) 05/06/2015  . Altered mental status 05/04/2015  . Vertigo 05/04/2015  . Acute ischemic stroke ([place]) 05/04/2015  . Encephalopathy acute   . CVA (cerebral vascular accident) ([place])   . Memory loss 06/01/2014  . Numbness 06/01/2014  . Osteoarthritis of left shoulder 01/06/2012  . Bronchiectasis ([place]) 10/10/2011  . Hyponatremia 09/12/2011  . Dyspnea 09/10/2011  . Hypertension 09/10/2011  . Diabetes mellitus ([place]) 09/10/2011  . PMR (  polymyalgia rheumatica) ([place]) 09/10/2011  . Breast cancer ([place]) 09/10/2011    Past Surgical History:  Procedure Laterality Date  . BREAST BIOPSY  1981   left  . CATARACT EXTRACTION W/ INTRAOCULAR LENS IMPLANT  07/2011   left  . EP IMPLANTABLE DEVICE N/A 05/08/2015   Procedure: Loop Recorder Insertion;  Surgeon: Will Meredith Leeds, MD;  Location: [facility];  Service: Cardiovascular;  Laterality: N/A;  . FEMUR SURGERY  07/07/2011   left rod placed  . MASTECTOMY  1973   right  . SHOULDER ARTHROSCOPY  06/2000    left  . TEE WITHOUT CARDIOVERSION N/A 05/07/2015   Procedure: TRANSESOPHAGEAL ECHOCARDIOGRAM (TEE);  Surgeon: Larey Dresser, MD;  EF 30%, +WMA, no thrombus/PFO, suspect ICM    . TOTAL SHOULDER ARTHROPLASTY  01/06/12   left  . TOTAL SHOULDER ARTHROPLASTY  01/06/2012   Procedure: TOTAL SHOULDER ARTHROPLASTY;  Surgeon: Johnny Bridge, MD;  Location: [facility];  Service: Orthopedics;  Laterality: Left;  . TUBAL LIGATION       OB History   None      Home Medications    Prior to Admission medications   Medication Sig Start Date End Date Taking? Authorizing Provider  acetaminophen (TYLENOL) 325 MG tablet Take 650 mg by mouth every 6 (six) hours as needed (for pain).    Yes [provider]  aspirin 81 MG tablet Take 1 tablet (81 mg total) by mouth daily. 04/07/16  Yes Belva Crome, MD  Cholecalciferol (VITAMIN D-3) 1000 units CAPS Take 1,000 Units by mouth daily.   Yes [provider]  donepezil (ARICEPT) 10 MG tablet Take 10 mg by mouth at bedtime.    Yes [provider]  escitalopram (LEXAPRO) 10 MG tablet Take 10 mg by mouth daily.   Yes [provider]  fenofibrate (TRICOR) 145 MG tablet Take 145 mg by mouth daily. 07/14/18  Yes [provider]  folic acid (FOLVITE) 1 MG tablet Take 1 mg by mouth daily.   Yes [provider]  gabapentin (NEURONTIN) 100 MG capsule Take 200 mg by mouth 3 (three) times daily.  10/12/17  Yes [provider]  lisinopril (PRINIVIL,ZESTRIL) 5 MG tablet Take 1 tablet (5 mg total) by mouth daily. 05/23/15  Yes Barrett, Evelene Croon, PA-C  loratadine (CLARITIN) 10 MG tablet Take 10 mg by mouth daily.   Yes [provider]  mirtazapine (REMERON) 7.5 MG tablet Take 3.75 mg by mouth at bedtime.  03/15/17  Yes [provider]  ondansetron (ZOFRAN) 4 MG tablet Take 4 mg by mouth every 8 (eight) hours as needed for nausea or vomiting.   Yes [provider]  vitamin C (ASCORBIC ACID) 500 MG  tablet Take 500 mg by mouth daily.   Yes [provider]  zinc sulfate 220 (50 Zn) MG capsule Take 220 mg by mouth daily.   Yes [provider]  carvedilol (COREG) 6.25 MG tablet Take 1 tablet (6.25 mg total) by mouth 2 (two) times daily with a meal. Patient not taking: Reported on 08/24/2018 05/09/15   Geradine Girt, DO  levofloxacin (LEVAQUIN) 500 MG tablet Take 1 tablet (500 mg total) by mouth daily. Patient not taking: Reported on 08/24/2018 07/05/18   Annita Brod, MD    Family History Family History  Problem Relation Age of Onset  . Heart attack Father   . Pulmonary fibrosis Sister   . Stroke Son   . Stroke Maternal Aunt  Social History Social History   Tobacco Use  . Smoking status: Never Smoker  . Smokeless tobacco: Former Systems developer    Types: Snuff  . Tobacco comment: "just tried smoking; didn't smoke to amount to nothing"  Substance Use Topics  . Alcohol use: No  . Drug use: No     Allergies   Patient has no known allergies.   Review of Systems Review of Systems Cannot obtain full review of systems level 5 caveat dementia.  Physical Exam Updated Vital Signs BP (!) 111/57   Pulse 63   Resp (!) 8   SpO2 94%   Physical Exam  Constitutional:  Patient is alert and pleasant.  She shows no acute distress.  She answers simple questions.  HENT:  Head: Normocephalic and atraumatic.  Mouth/Throat: Oropharynx is clear and moist.  Eyes: EOM are normal.  Cardiovascular: Normal rate, regular rhythm, normal heart sounds and intact distal pulses.  Pulmonary/Chest: Effort normal and breath sounds normal.  Abdominal: Soft. Bowel sounds are normal. She exhibits no distension. There is no tenderness. There is no guarding.  Genitourinary:  Genitourinary Comments: Rectal exam: Soft brown stool in the vault.  No visible blood.  No mass and no significant pain with rectal exam.  Musculoskeletal:  Patient has mild edema at the ankles.  She does not have  significant pitting edema.  She has chronic pressure wounds of the heels.  The left heel has a ecchymotic wound of the near entirety of the point of the heel.  This is dry.  There is not erythema extending over more of the foot or ankle.  Patient has more limited pressure sore on the right foot.  Patient's back examined.  She does not have sacral break down.  There is mild erythema of the sacrum but not significant.  Neurological:  Patient's baseline neurologic function is limited by her's previous old stroke.  She is alert and answers simple questions.  She tries to help in some movements but has limited personal movement due to body habitus and general weakness.  She however does not have significant flexion contractures.  Skin: Skin is warm and dry.  Psychiatric: She has a normal mood and affect.     ED Treatments / Results  Labs (all labs ordered are listed, but only abnormal results are displayed) Labs Reviewed  COMPREHENSIVE METABOLIC PANEL - Abnormal; Notable for the following components:      Result Value   CO2 20 (*)    Glucose, Bld 102 (*)    Creatinine, Ser 1.40 (*)    Albumin 2.5 (*)    Total Bilirubin 1.3 (*)    GFR calc non Af Amer 35 (*)    GFR calc Af Amer 40 (*)    Anion gap 16 (*)    All other components within normal limits  CBC WITH DIFFERENTIAL/PLATELET - Abnormal; Notable for the following components:   WBC 13.0 (*)    Hemoglobin 11.1 (*)    MCH 24.7 (*)    RDW 22.8 (*)    Platelets 407 (*)    nRBC 1.5 (*)    Lymphs Abs 4.7 (*)    Monocytes Absolute 1.1 (*)    All other components within normal limits  PROTIME-INR - Abnormal; Notable for the following components:   Prothrombin Time 15.3 (*)    All other components within normal limits  I-STAT CG4 LACTIC ACID, ED - Abnormal; Notable for the following components:   Lactic Acid, Venous 4.37 (*)  All other components within normal limits  I-STAT CG4 LACTIC ACID, ED - Abnormal; Notable for the following  components:   Lactic Acid, Venous 3.88 (*)    All other components within normal limits  LIPASE, BLOOD  URINALYSIS, ROUTINE W REFLEX MICROSCOPIC  I-STAT TROPONIN, ED  POC OCCULT BLOOD, ED    EKG EKG Interpretation  Date/Time:  Tuesday August 24 2018 16:06:09 EST Ventricular Rate:  68 PR Interval:    QRS Duration: 90 QT Interval:  475 QTC Calculation: 506 R Axis:   -78 Text Interpretation:  Sinus rhythm Consider right ventricular hypertrophy Inferior infarct, old Lateral leads are also involved Prolonged QT interval no sig change from previous Confirmed by Charlesetta Shanks [phone]) on 08/24/2018 4:39:58 PM   Radiology No results found.  Procedures Procedures (including critical care time)  Medications Ordered in ED Medications  sodium chloride 0.9 % bolus 500 mL (0 mLs Intravenous Stopped 08/24/18 1840)    Followed by  0.9 %  sodium chloride infusion (has no administration in time range)  iohexol (OMNIPAQUE) 300 MG/ML solution 100 mL (100 mLs Intravenous Contrast Given 08/24/18 1832)     Initial Impression / Assessment and Plan / ED Course  I have reviewed the triage vital signs and the nursing notes.  Pertinent labs & imaging results that were available during my care of the patient were reviewed by me and considered in my medical decision making (see chart for details).  Clinical Course as of Aug 24 1905  Tue Aug 24, 2018  1815 Consult to GI and unassigned medical admit ordered.   [MP]  6213 At initial evaluation and history taking of the patient, I invested a significant amount of time explaining to the patient and family members present, the anticipated course of evaluation.  They were educated on my interpretation of the note submitted by Dr. Paulita Fujita today.  Attempts were made to explain the October CT findings of a possible mass in the intestinal wall with recommended subsequent evaluation and Dr. Erlinda Hong notes indicating concern for possible evolution and obstruction  with his subsequent recommendation for emergency department presentation and admission with consult to GI.  They were educated that patient would not be getting any endoscopies tonight as these procedures require preparation AND often were scheduled on outpatient basis after admission.  They were counseled on the plan for obtaining diagnostic lab work and CT of the abdomen while in the emergency department.  They were advised that my initial impression was the patient's condition is stable with normal vital signs and nonsurgical abdomen.  They were counseled that I would plan to call for admission as per today's GI note by Dr. Paulita Fujita.  They were counseled that this was the extent of anticipated emergency department work-up and involvement unless other emergent findings were identified in the course of the patient's work-up.  Patient's nurse reports that at this time family members are asking many questions regarding if Dr. Paulita Fujita has been called  and what is happening.  Patients LOS at this time is 3:27 minutes.  CT scan is still pending.  However, as I do not anticipate emergent surgical findings, will call [facility] and unassigned medical service for admission.   [MP]  0865 The   [MP]  1848 Consult: Reviewed with Dr. Michail Sermon on-call for [facility].  He advises patient will be seen in the morning for consult.  Dr. Paulita Fujita did not check her out as the patient to be seen or followed for the day.  At this time, with nonacute findings consult will be done tomorrow.   [MP]  5056 Consult: Reviewed with [facility] hospitalist for admission.   [MP]    Clinical Course User Index [MP] Charlesetta Shanks, MD      Final Clinical Impressions(s) / ED Diagnoses   Final diagnoses:  Failure to thrive (0-17)  Non-intractable vomiting with nausea, unspecified vomiting type    ED Discharge Orders    None       Charlesetta Shanks, MD 08/27/18 [phone]

## 2018-08-24 NOTE — ED Triage Notes (Signed)
Pt arrives from nursing home with daughter with a c/o of emesis and weight loss over the last 1 month. Pt denies any pain but appears pale and weak.  Pt admitted in oct for colitis.

## 2018-08-25 ENCOUNTER — Encounter (HOSPITAL_COMMUNITY): Payer: Self-pay

## 2018-08-25 ENCOUNTER — Other Ambulatory Visit: Payer: Self-pay

## 2018-08-25 DIAGNOSIS — K2980 Duodenitis without bleeding: Secondary | ICD-10-CM | POA: Diagnosis not present

## 2018-08-25 DIAGNOSIS — D72829 Elevated white blood cell count, unspecified: Secondary | ICD-10-CM | POA: Diagnosis present

## 2018-08-25 DIAGNOSIS — F329 Major depressive disorder, single episode, unspecified: Secondary | ICD-10-CM | POA: Diagnosis not present

## 2018-08-25 DIAGNOSIS — K2210 Ulcer of esophagus without bleeding: Secondary | ICD-10-CM | POA: Diagnosis not present

## 2018-08-25 DIAGNOSIS — N179 Acute kidney failure, unspecified: Secondary | ICD-10-CM | POA: Diagnosis present

## 2018-08-25 DIAGNOSIS — J9811 Atelectasis: Secondary | ICD-10-CM | POA: Diagnosis not present

## 2018-08-25 DIAGNOSIS — D500 Iron deficiency anemia secondary to blood loss (chronic): Secondary | ICD-10-CM | POA: Diagnosis present

## 2018-08-25 DIAGNOSIS — R17 Unspecified jaundice: Secondary | ICD-10-CM | POA: Diagnosis present

## 2018-08-25 DIAGNOSIS — R112 Nausea with vomiting, unspecified: Secondary | ICD-10-CM | POA: Diagnosis present

## 2018-08-25 DIAGNOSIS — R627 Adult failure to thrive: Secondary | ICD-10-CM | POA: Diagnosis not present

## 2018-08-25 DIAGNOSIS — D649 Anemia, unspecified: Secondary | ICD-10-CM | POA: Diagnosis present

## 2018-08-25 DIAGNOSIS — L89620 Pressure ulcer of left heel, unstageable: Secondary | ICD-10-CM | POA: Diagnosis present

## 2018-08-25 DIAGNOSIS — Z66 Do not resuscitate: Secondary | ICD-10-CM | POA: Diagnosis present

## 2018-08-25 DIAGNOSIS — D509 Iron deficiency anemia, unspecified: Secondary | ICD-10-CM | POA: Diagnosis not present

## 2018-08-25 DIAGNOSIS — E43 Unspecified severe protein-calorie malnutrition: Secondary | ICD-10-CM | POA: Diagnosis present

## 2018-08-25 DIAGNOSIS — E11649 Type 2 diabetes mellitus with hypoglycemia without coma: Secondary | ICD-10-CM | POA: Diagnosis present

## 2018-08-25 DIAGNOSIS — F0151 Vascular dementia with behavioral disturbance: Secondary | ICD-10-CM | POA: Diagnosis present

## 2018-08-25 DIAGNOSIS — R5381 Other malaise: Secondary | ICD-10-CM | POA: Diagnosis present

## 2018-08-25 DIAGNOSIS — I130 Hypertensive heart and chronic kidney disease with heart failure and stage 1 through stage 4 chronic kidney disease, or unspecified chronic kidney disease: Secondary | ICD-10-CM | POA: Diagnosis present

## 2018-08-25 DIAGNOSIS — E162 Hypoglycemia, unspecified: Secondary | ICD-10-CM | POA: Diagnosis present

## 2018-08-25 DIAGNOSIS — I428 Other cardiomyopathies: Secondary | ICD-10-CM | POA: Diagnosis present

## 2018-08-25 DIAGNOSIS — G8929 Other chronic pain: Secondary | ICD-10-CM | POA: Diagnosis present

## 2018-08-25 DIAGNOSIS — J9601 Acute respiratory failure with hypoxia: Secondary | ICD-10-CM | POA: Diagnosis not present

## 2018-08-25 DIAGNOSIS — F419 Anxiety disorder, unspecified: Secondary | ICD-10-CM | POA: Diagnosis not present

## 2018-08-25 DIAGNOSIS — F0390 Unspecified dementia without behavioral disturbance: Secondary | ICD-10-CM | POA: Diagnosis present

## 2018-08-25 DIAGNOSIS — K2901 Acute gastritis with bleeding: Secondary | ICD-10-CM | POA: Diagnosis not present

## 2018-08-25 DIAGNOSIS — M353 Polymyalgia rheumatica: Secondary | ICD-10-CM | POA: Diagnosis present

## 2018-08-25 DIAGNOSIS — K254 Chronic or unspecified gastric ulcer with hemorrhage: Secondary | ICD-10-CM | POA: Diagnosis present

## 2018-08-25 DIAGNOSIS — K259 Gastric ulcer, unspecified as acute or chronic, without hemorrhage or perforation: Secondary | ICD-10-CM | POA: Diagnosis not present

## 2018-08-25 DIAGNOSIS — J90 Pleural effusion, not elsewhere classified: Secondary | ICD-10-CM | POA: Diagnosis not present

## 2018-08-25 DIAGNOSIS — E785 Hyperlipidemia, unspecified: Secondary | ICD-10-CM | POA: Diagnosis present

## 2018-08-25 DIAGNOSIS — G301 Alzheimer's disease with late onset: Secondary | ICD-10-CM | POA: Diagnosis not present

## 2018-08-25 DIAGNOSIS — E872 Acidosis: Secondary | ICD-10-CM | POA: Diagnosis present

## 2018-08-25 DIAGNOSIS — K3189 Other diseases of stomach and duodenum: Secondary | ICD-10-CM | POA: Diagnosis not present

## 2018-08-25 DIAGNOSIS — R0602 Shortness of breath: Secondary | ICD-10-CM | POA: Diagnosis not present

## 2018-08-25 DIAGNOSIS — I5033 Acute on chronic diastolic (congestive) heart failure: Secondary | ICD-10-CM | POA: Diagnosis present

## 2018-08-25 DIAGNOSIS — N183 Chronic kidney disease, stage 3 unspecified: Secondary | ICD-10-CM | POA: Diagnosis present

## 2018-08-25 DIAGNOSIS — F0280 Dementia in other diseases classified elsewhere without behavioral disturbance: Secondary | ICD-10-CM | POA: Diagnosis not present

## 2018-08-25 DIAGNOSIS — K449 Diaphragmatic hernia without obstruction or gangrene: Secondary | ICD-10-CM | POA: Diagnosis not present

## 2018-08-25 DIAGNOSIS — K219 Gastro-esophageal reflux disease without esophagitis: Secondary | ICD-10-CM | POA: Diagnosis present

## 2018-08-25 DIAGNOSIS — E1122 Type 2 diabetes mellitus with diabetic chronic kidney disease: Secondary | ICD-10-CM | POA: Diagnosis present

## 2018-08-25 LAB — CBG MONITORING, ED
GLUCOSE-CAPILLARY: 21 mg/dL — AB (ref 70–99)
Glucose-Capillary: 113 mg/dL — ABNORMAL HIGH (ref 70–99)
Glucose-Capillary: 118 mg/dL — ABNORMAL HIGH (ref 70–99)
Glucose-Capillary: 118 mg/dL — ABNORMAL HIGH (ref 70–99)
Glucose-Capillary: 57 mg/dL — ABNORMAL LOW (ref 70–99)
Glucose-Capillary: 94 mg/dL (ref 70–99)
Glucose-Capillary: 99 mg/dL (ref 70–99)

## 2018-08-25 LAB — CBC
HCT: 24.8 % — ABNORMAL LOW (ref 36.0–46.0)
HCT: 26.9 % — ABNORMAL LOW (ref 36.0–46.0)
Hemoglobin: 7.9 g/dL — ABNORMAL LOW (ref 12.0–15.0)
Hemoglobin: 8.8 g/dL — ABNORMAL LOW (ref 12.0–15.0)
MCH: 25.6 pg — ABNORMAL LOW (ref 26.0–34.0)
MCH: 25.7 pg — AB (ref 26.0–34.0)
MCHC: 31.9 g/dL (ref 30.0–36.0)
MCHC: 32.7 g/dL (ref 30.0–36.0)
MCV: 80.3 fL (ref 80.0–100.0)
MCV: 78.7 fL — ABNORMAL LOW (ref 80.0–100.0)
Platelets: 345 10*3/uL (ref 150–400)
Platelets: 363 10*3/uL (ref 150–400)
RBC: 3.09 MIL/uL — ABNORMAL LOW (ref 3.87–5.11)
RBC: 3.42 MIL/uL — ABNORMAL LOW (ref 3.87–5.11)
RDW: 22.5 % — AB (ref 11.5–15.5)
RDW: 22 % — ABNORMAL HIGH (ref 11.5–15.5)
WBC: 8.7 10*3/uL (ref 4.0–10.5)
WBC: 9.9 10*3/uL (ref 4.0–10.5)
nRBC: 1.3 % — ABNORMAL HIGH (ref 0.0–0.2)
nRBC: 1 % — ABNORMAL HIGH (ref 0.0–0.2)

## 2018-08-25 LAB — POTASSIUM: Potassium: 3.2 mmol/L — ABNORMAL LOW (ref 3.5–5.1)

## 2018-08-25 LAB — BASIC METABOLIC PANEL
Anion gap: 10 (ref 5–15)
Anion gap: 8 (ref 5–15)
BUN: 7 mg/dL — ABNORMAL LOW (ref 8–23)
BUN: 9 mg/dL (ref 8–23)
CO2: 21 mmol/L — AB (ref 22–32)
CO2: 24 mmol/L (ref 22–32)
CREATININE: 1.08 mg/dL — AB (ref 0.44–1.00)
Calcium: 8.3 mg/dL — ABNORMAL LOW (ref 8.9–10.3)
Calcium: 8.7 mg/dL — ABNORMAL LOW (ref 8.9–10.3)
Chloride: 108 mmol/L (ref 98–111)
Chloride: 110 mmol/L (ref 98–111)
Creatinine, Ser: 1.05 mg/dL — ABNORMAL HIGH (ref 0.44–1.00)
GFR calc Af Amer: 57 mL/min — ABNORMAL LOW (ref >60)
GFR calc Af Amer: 55 mL/min — ABNORMAL LOW (ref >60)
GFR calc non Af Amer: 49 mL/min — ABNORMAL LOW (ref >60)
GFR calc non Af Amer: 47 mL/min — ABNORMAL LOW (ref >60)
GLUCOSE: 701 mg/dL — AB (ref 70–99)
GLUCOSE: 147 mg/dL — AB (ref 70–99)
Potassium: 3.2 mmol/L — ABNORMAL LOW (ref 3.5–5.1)
Potassium: 3 mmol/L — ABNORMAL LOW (ref 3.5–5.1)
Sodium: 139 mmol/L (ref 135–145)
Sodium: 142 mmol/L (ref 135–145)

## 2018-08-25 LAB — GLUCOSE, CAPILLARY
Glucose-Capillary: 84 mg/dL (ref 70–99)
Glucose-Capillary: 90 mg/dL (ref 70–99)

## 2018-08-25 LAB — URINALYSIS, ROUTINE W REFLEX MICROSCOPIC
BILIRUBIN URINE: NEGATIVE
GLUCOSE, UA: 50 mg/dL — AB
Ketones, ur: 5 mg/dL — AB
NITRITE: NEGATIVE
PH: 6 (ref 5.0–8.0)
Protein, ur: NEGATIVE mg/dL

## 2018-08-25 LAB — PROCALCITONIN: Procalcitonin: <0.1 ng/mL

## 2018-08-25 MED ORDER — SODIUM CHLORIDE 0.45 % IV SOLN
INTRAVENOUS | Status: DC
  Administered 2018-08-25: 18:00:00 via INTRAVENOUS

## 2018-08-25 MED ORDER — GABAPENTIN 100 MG PO CAPS
100.0000 mg | ORAL_CAPSULE | Freq: Three times a day (TID) | ORAL | Status: DC
  Administered 2018-08-25 – 2018-08-29 (×12): 100 mg via ORAL
  Filled 2018-08-25 (×15): qty 1

## 2018-08-25 MED ORDER — DEXTROSE 50 % IV SOLN
INTRAVENOUS | Status: AC
  Administered 2018-08-26: 25 g via INTRAVENOUS
  Filled 2018-08-25: qty 50

## 2018-08-25 MED ORDER — DEXTROSE 50 % IV SOLN
25.0000 g | INTRAVENOUS | Status: AC
  Administered 2018-08-26: 25 g via INTRAVENOUS

## 2018-08-25 MED ORDER — DEXTROSE 50 % IV SOLN
INTRAVENOUS | Status: AC
  Filled 2018-08-25: qty 50

## 2018-08-25 MED ORDER — DEXTROSE 50 % IV SOLN
1.0000 | Freq: Once | INTRAVENOUS | Status: AC
  Administered 2018-08-25: 50 mL via INTRAVENOUS

## 2018-08-25 MED ORDER — POTASSIUM CHLORIDE CRYS ER 20 MEQ PO TBCR
40.0000 meq | EXTENDED_RELEASE_TABLET | Freq: Once | ORAL | Status: AC
  Administered 2018-08-25: 40 meq via ORAL
  Filled 2018-08-25: qty 2

## 2018-08-25 MED ORDER — HYDRALAZINE HCL 20 MG/ML IJ SOLN: 5.0000 mg | INTRAMUSCULAR | Status: DC | PRN

## 2018-08-25 MED ORDER — ONDANSETRON HCL 4 MG/2ML IJ SOLN
4.0000 mg | Freq: Three times a day (TID) | INTRAMUSCULAR | Status: DC | PRN
  Administered 2018-08-31: 4 mg via INTRAVENOUS
  Filled 2018-08-25: qty 2

## 2018-08-25 MED ORDER — PROMETHAZINE HCL 25 MG/ML IJ SOLN
12.5000 mg | Freq: Four times a day (QID) | INTRAMUSCULAR | Status: DC | PRN
  Administered 2018-08-25: 12.5 mg via INTRAVENOUS
  Filled 2018-08-25: qty 1

## 2018-08-25 MED ORDER — DEXTROSE IN LACTATED RINGERS 5 % IV SOLN
INTRAVENOUS | Status: AC
  Administered 2018-08-25 (×2): via INTRAVENOUS

## 2018-08-25 MED ORDER — DEXTROSE IN LACTATED RINGERS 5 % IV SOLN
INTRAVENOUS | Status: DC
  Administered 2018-08-25: 14:00:00 via INTRAVENOUS

## 2018-08-25 MED ORDER — MAGNESIUM SULFATE 2 GM/50ML IV SOLN
2.0000 g | Freq: Once | INTRAVENOUS | Status: AC
  Administered 2018-08-25: 2 g via INTRAVENOUS
  Filled 2018-08-25: qty 50

## 2018-08-25 NOTE — ED Notes (Signed)
Please call Jenny Reichmann daughter with update [phone]

## 2018-08-25 NOTE — ED Notes (Signed)
Report attempted 

## 2018-08-25 NOTE — Consult Note (Signed)
Referring Provider: Dr. Eliseo Squires Primary Care Physician:  Maryella Shivers, MD Primary Gastroenterologist:  Dr. Paulita Fujita  Reason for Consultation:  Vomiting; Failure to Thrive  HPI: Cathy Taylor is a 84 y.o. female with multiple medical problems who has been having progressive vomiting after eating small bites of food for the last several months with unintentional weight loss of 20 pounds during that time. Unable to get a reliable history from the patient due to dementia. Daughter at bedside provides the history and denies nausea, abdominal pain, bowel habit changes, or rectal bleeding. Noncontrast CT in October showed bowel wall thickening in the terminal ileum and focal cecal wall thickening concerning for a cecal mass but contrasted CT this admit was negative for acute findings. Daughter thinks she had a colonoscopy about 8 years ago but record not found. Patient is lying in the bed slow to arouse and daughter attributes her somnolence to the Phenergan she is on. Hgb 11.1 (10.1 on 07/05/18).  Past Medical History:  Diagnosis Date  . Abdominal hernia   . Anxiety   . Arthritis   . Breast cancer ([place]) 1973   right  . 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 Bronchiectasis 11/12  . Bronchitis       . Chronic back pain greater than 3 months duration   . Depression   . Enteritis 06/30/2018  . GERD (gastroesophageal reflux disease)   . Heart murmur   . Hypercholesteremia   . Hypertension   . Left ventricular dysfunction 04/2015   EF 30% by TEE, + AK mid-apical anteroseptal and anterior walls plus apex, neg bubble study, grade III plaque asc Ao, suspect ICM  . Osteoarthritis of left shoulder 01/06/2012  . PMR (polymyalgia rheumatica) ([facility])   . Stress fracture of left femur with nonunion 07/08/2011  . Stroke ([place])   . Type II diabetes mellitus ([place])   . Umbilical hernia 5/63/87   unrepaired    Past Surgical History:  Procedure Laterality Date  . BREAST BIOPSY  1981   left  . CATARACT EXTRACTION  W/ INTRAOCULAR LENS IMPLANT  07/2011   left  . EP IMPLANTABLE DEVICE N/A 05/08/2015   Procedure: Loop Recorder Insertion;  Surgeon: Will Meredith Leeds, MD;  Location: [facility];  Service: Cardiovascular;  Laterality: N/A;  . FEMUR SURGERY  07/07/2011   left rod placed  . MASTECTOMY  1973   right  . SHOULDER ARTHROSCOPY  06/2000   left  . TEE WITHOUT CARDIOVERSION N/A 05/07/2015   Procedure: TRANSESOPHAGEAL ECHOCARDIOGRAM (TEE);  Surgeon: Larey Dresser, MD;  EF 30%, +WMA, no thrombus/PFO, suspect ICM    . TOTAL SHOULDER ARTHROPLASTY  01/06/12   left  . TOTAL SHOULDER ARTHROPLASTY  01/06/2012   Procedure: TOTAL SHOULDER ARTHROPLASTY;  Surgeon: Johnny Bridge, MD;  Location: [facility];  Service: Orthopedics;  Laterality: Left;  . TUBAL LIGATION      Prior to Admission medications   Medication Sig Start Date End Date Taking? Authorizing Provider  acetaminophen (TYLENOL) 325 MG tablet Take 650 mg by mouth every 6 (six) hours as needed (for pain).    Yes [provider]  aspirin 81 MG tablet Take 1 tablet (81 mg total) by mouth daily. 04/07/16  Yes Belva Crome, MD  Cholecalciferol (VITAMIN D-3) 1000 units CAPS Take 1,000 Units by mouth daily.   Yes [provider]  donepezil (ARICEPT) 10 MG tablet Take 10 mg by mouth at bedtime.    Yes [provider]  escitalopram (LEXAPRO) 10 MG tablet Take 10 mg by mouth daily.   Yes [provider]  fenofibrate (TRICOR) 145 MG tablet Take 145 mg by mouth daily. 07/14/18  Yes [provider]  folic acid (FOLVITE) 1 MG tablet Take 1 mg by mouth daily.   Yes [provider]  gabapentin (NEURONTIN) 100 MG capsule Take 200 mg by mouth 3 (three) times daily.  10/12/17  Yes [provider]  lisinopril (PRINIVIL,ZESTRIL) 5 MG tablet Take 1 tablet (5 mg total) by mouth daily. 05/23/15  Yes Barrett, Evelene Croon, PA-C  loratadine (CLARITIN) 10 MG tablet Take 10 mg by mouth daily.   Yes [provider]  mirtazapine (REMERON) 7.5 MG tablet Take 3.75 mg by mouth at bedtime.  03/15/17  Yes [provider]  ondansetron (ZOFRAN) 4 MG tablet Take 4 mg by mouth every 8 (eight) hours as needed for nausea or vomiting.   Yes [provider]  vitamin C (ASCORBIC ACID) 500 MG tablet Take 500 mg by mouth daily.   Yes [provider]  zinc sulfate 220 (50 Zn) MG capsule Take 220 mg by mouth daily.   Yes [provider]  carvedilol (COREG) 6.25 MG tablet Take 1 tablet (6.25 mg total) by mouth 2 (two) times daily with a meal. Patient not taking: Reported on 08/24/2018 05/09/15   Geradine Girt, DO  levofloxacin (LEVAQUIN) 500 MG tablet Take 1 tablet (500 mg total) by mouth daily. Patient not taking: Reported on 08/24/2018 07/05/18   Annita Brod, MD    Scheduled Meds: . aspirin EC  81 mg Oral Daily  . cholecalciferol  1,000 Units Oral Daily  . donepezil  10 mg Oral QHS  . enoxaparin (LOVENOX) injection  30 mg Subcutaneous Q24H  . escitalopram  10 mg Oral Daily  . fenofibrate  160 mg Oral Daily  . folic acid  1 mg Oral Daily  . gabapentin  200 mg Oral TID  . loratadine  10 mg Oral Daily  . mirtazapine  3.75 mg Oral QHS  . vitamin C  500 mg Oral Daily  . zinc sulfate  220 mg Oral Daily   Continuous Infusions: . dextrose 5% lactated ringers 100 mL/hr at 08/25/18 0857   PRN Meds:.acetaminophen **OR** acetaminophen, hydrALAZINE, promethazine  Allergies as of 08/24/2018  . (No Known Allergies)    Family History  Problem Relation Age of Onset  . Heart attack Father   . Pulmonary fibrosis Sister   . Stroke Son   . Stroke Maternal Aunt     Social History   Socioeconomic History  . Marital status: Married    Spouse name: Not on file  . Number of children: Not on file  . Years of education: Not on file  . Highest education level: Not on file  Occupational History  . Not on file  Social Needs  . Financial resource strain: Not on file   . Food insecurity:    Worry: Not on file    Inability: Not on file  . Transportation needs:    Medical: Not on file    Non-medical: Not on file  Tobacco Use  . Smoking status: Never Smoker  . Smokeless tobacco: Former Systems developer    Types: Snuff  . Tobacco comment: "just tried smoking; didn't smoke to amount to nothing"  Substance and Sexual Activity  . Alcohol use: No  . Drug use: No  . Sexual activity: Not Currently  Lifestyle  .  Physical activity:    Days per week: Not on file    Minutes per session: Not on file  . Stress: Not on file  Relationships  . Social connections:    Talks on phone: Not on file    Gets together: Not on file    Attends religious service: Not on file    Active member of club or organization: Not on file    Attends meetings of clubs or organizations: Not on file    Relationship status: Not on file  . Intimate partner violence:    Fear of current or ex partner: Not on file    Emotionally abused: Not on file    Physically abused: Not on file    Forced sexual activity: Not on file  Other Topics Concern  . Not on file  Social History Narrative   Currently lives with her husband who has Parkinson's Disease and Dementia. Their children live nearby. They have home health caregivers who assist them throughout the week as well.    Review of Systems: All negative except as stated above in HPI.  Physical Exam: Vital signs: Vitals:   08/24/18 2324 08/25/18 0543  BP: (!) 157/64 (!) 122/44  Pulse: 100 70  Resp: 18   Temp: 98.6 F (37 C)   SpO2: 93% 100%     General:  Somnolent, elderly, frail, no acute distress Head: normocephalic, atraumatic Eyes: anicteric sclera ENT: oropharynx clear Neck: supple, nontender Lungs:  Clear throughout to auscultation.   No wheezes, crackles, or rhonchi. No acute distress. Heart:  Regular rate and rhythm; no murmurs, clicks, rubs,  or gallops. Abdomen: diffuse tenderness with minimal guarding, soft, nondistended, +BS   Rectal:  Deferred Ext: no edema  GI:  Lab Results: Recent Labs    08/24/18 1630  WBC 13.0*  HGB 11.1*  HCT 36.6  PLT 407*   BMET Recent Labs    08/24/18 1630  NA 139  K 3.8  CL 103  CO2 20*  GLUCOSE 102*  BUN 13  CREATININE 1.40*  CALCIUM 9.3   LFT Recent Labs    08/24/18 1630  PROT 7.5  ALBUMIN 2.5*  AST 24  ALT 10  ALKPHOS 50  BILITOT 1.3*   PT/INR Recent Labs    08/24/18 1805  LABPROT 15.3*  INR 1.22     Studies/Results: Dg Chest 2 View  Result Date: 08/24/2018 CLINICAL DATA:  Leukocytosis EXAM: CHEST - 2 VIEW COMPARISON:  07/03/2018 FINDINGS: Heart is upper limits normal in size. No confluent airspace opacities, effusions or edema. No acute bony abnormality. Advanced degenerative changes in the right shoulder. Prior left shoulder replacement. Loop recorder device in the left anterior chest wall. IMPRESSION: No active cardiopulmonary disease. Electronically Signed   By: Rolm Baptise M.D.   On: 08/24/2018 21:44   Ct Abdomen Pelvis W Contrast  Result Date: 08/24/2018 CLINICAL DATA:  Nausea, vomiting EXAM: CT ABDOMEN AND PELVIS WITH CONTRAST TECHNIQUE: Multidetector CT imaging of the abdomen and pelvis was performed using the standard protocol following bolus administration of intravenous contrast. CONTRAST:  192mL OMNIPAQUE IOHEXOL 300 MG/ML  SOLN COMPARISON:  06/30/2018 FINDINGS: Lower chest: Lung bases are clear. No effusions. Heart is normal size. Hepatobiliary: Small layering stone within the gallbladder fundus. No focal hepatic abnormality or biliary ductal dilatation. Pancreas: Mildly prominent pancreatic duct, with a focal dilated area in the pancreatic body measuring up to 6 mm. Pancreatic atrophy. No focal abnormality. Spleen: No focal abnormality.  Normal size. Adrenals/Urinary Tract: No  adrenal abnormality. No focal renal abnormality. No stones or hydronephrosis. Urinary bladder is unremarkable. Stomach/Bowel: Stomach, large and small bowel grossly  unremarkable. Vascular/Lymphatic: Aortic atherosclerosis. No enlarged abdominal or pelvic lymph nodes. Reproductive: Uterus and adnexa unremarkable.  No mass. Other: No free fluid or free air. Musculoskeletal: Degenerative changes in the lumbar spine. Leftward scoliosis. No acute bony abnormality. IMPRESSION: Small layering dependent gallstone within the gallbladder. No acute findings in the abdomen or pelvis. Aortic atherosclerosis. Electronically Signed   By: Rolm Baptise M.D.   On: 08/24/2018 19:21    Impression/Plan: Progressive vomiting with minimal PO intake with weight loss and weakness and failure to thrive. EGD tomorrow to look for gastric outlet obstruction or other source of her recurrent vomiting. I do not think she could undergo a colonoscopy prep and with the negative contrasted CT scan would not recommend a colonoscopy due to her advanced age and comorbidities. NPO. EGD tomorrow. Risks/benefits of EGD discussed with daughter who agrees to proceed.    LOS: 0 days   [name]  08/25/2018, 9:27 AM  Questions please call [phone]

## 2018-08-25 NOTE — H&P (Signed)
History and Physical    Cathy Taylor NFA:[id number] DOB: 04-23-1933 DOA: 08/24/2018  PCP: Maryella Shivers, MD Patient coming from: Home  Chief Complaint: Nausea, vomiting, weight loss  HPI: Cathy Taylor is a 84 y.o. female with medical history significant of abdominal hernia, anxiety, depression, GERD, hypertension, hyperlipidemia, CHF, CKD 3, CVA, type 2 diabetes being sent to the hospital from her gastroenterologist office for evaluation of nausea, vomiting, and weight loss.  Patient resides in a nursing home.  History provided by patient and family at bedside.  Over the past few months, she has had progressive nausea and vomiting.  She has not been able to tolerate any solids or liquids and has lost a significant amount of weight over this timeframe.  She was admitted in October for similar symptoms and was told she had a bowel infection and was discharged in a few days.  However, her symptoms have persisted and worsened.  Patient denies having any abdominal pain or diarrhea.  Patient was seen by GI (Dr. Paulita Fujita) prior to admission today.  CT abdomen pelvis done in October which suggested terminal ileal inflammation but also suspected cecal mass.  He was concerned that the patient might be having progressive bowel obstruction from a cecal tumor and advised family to bring her into the ED.  Per chart review, patient was admitted from June 29, 2018 to July 05, 2018 for UTI, ischemic versus infectious colitis, and AKI.  CT done at that time showed bowel wall thickening in the terminal ileum suggesting enteritis (infectious versus inflammatory).  Also showed focal thickening of the cecal wall suspicious for a cecal mass.   ED Course: Afebrile, blood pressure soft on arrival.  Blood pressure improved after IV fluid resuscitation.  White count 13.0.  Lactate 4.37.  Bicarb is 20, anion gap 16.  Albumin 2.5.  T bili 1.3, remainder of LFTs normal.  Lipase normal.  FOBT negative.  INR 1.2.  Hemoglobin  11.1, stable.  Creatinine 1.4, was 1.2 a month ago.  I-STAT troponin negative and EKG not suggestive of ACS.  CT abdomen pelvis showing small layering stone within the gallbladder fundus, no focal hepatic abnormality or biliary ductal dilation.  Also showing mildly prominent pancreatic duct with a focal dilated area in the pancreatic body measuring up to 6 mm, pancreatic atrophy, no focal abnormality.  ED physician spoke to Dr. Michail Sermon, GI will see the patient in the morning.  Review of Systems: As per HPI otherwise 10 point review of systems negative.  Past Medical History:  Diagnosis Date  . Abdominal hernia   . Anxiety   . Arthritis   . Breast cancer ([place]) 1973   right  . 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 Bronchiectasis 11/12  . Bronchitis       . Chronic back pain greater than 3 months duration   . Depression   . Enteritis 06/30/2018  . GERD (gastroesophageal reflux disease)   . Heart murmur   . Hypercholesteremia   . Hypertension   . Left ventricular dysfunction 04/2015   EF 30% by TEE, + AK mid-apical anteroseptal and anterior walls plus apex, neg bubble study, grade III plaque asc Ao, suspect ICM  . Osteoarthritis of left shoulder 01/06/2012  . PMR (polymyalgia rheumatica) (HCC)   . Stress fracture of left femur with nonunion 07/08/2011  . Stroke ([place])   . Type II diabetes mellitus ([place])   . Umbilical hernia 4/69/62   unrepaired    Past Surgical History:  Procedure Laterality Date  . BREAST BIOPSY  1981   left  . CATARACT EXTRACTION W/ INTRAOCULAR LENS IMPLANT  07/2011   left  . EP IMPLANTABLE DEVICE N/A 05/08/2015   Procedure: Loop Recorder Insertion;  Surgeon: Will Meredith Leeds, MD;  Location: [facility];  Service: Cardiovascular;  Laterality: N/A;  . FEMUR SURGERY  07/07/2011   left rod placed  . MASTECTOMY  1973   right  . SHOULDER ARTHROSCOPY  06/2000   left  . TEE WITHOUT CARDIOVERSION N/A 05/07/2015   Procedure: TRANSESOPHAGEAL ECHOCARDIOGRAM  (TEE);  Surgeon: Larey Dresser, MD;  EF 30%, +WMA, no thrombus/PFO, suspect ICM    . TOTAL SHOULDER ARTHROPLASTY  01/06/12   left  . TOTAL SHOULDER ARTHROPLASTY  01/06/2012   Procedure: TOTAL SHOULDER ARTHROPLASTY;  Surgeon: Johnny Bridge, MD;  Location: [facility];  Service: Orthopedics;  Laterality: Left;  . TUBAL LIGATION       reports that she has never smoked. She quit smokeless tobacco use about 69 years ago.  Her smokeless tobacco use included snuff. She reports that she does not drink alcohol or use drugs.  No Known Allergies  Family History  Problem Relation Age of Onset  . Heart attack Father   . Pulmonary fibrosis Sister   . Stroke Son   . Stroke Maternal Aunt     Prior to Admission medications   Medication Sig Start Date End Date Taking? Authorizing Provider  acetaminophen (TYLENOL) 325 MG tablet Take 650 mg by mouth every 6 (six) hours as needed (for pain).    Yes [provider]  aspirin 81 MG tablet Take 1 tablet (81 mg total) by mouth daily. 04/07/16  Yes Belva Crome, MD  Cholecalciferol (VITAMIN D-3) 1000 units CAPS Take 1,000 Units by mouth daily.   Yes [provider]  donepezil (ARICEPT) 10 MG tablet Take 10 mg by mouth at bedtime.    Yes [provider]  escitalopram (LEXAPRO) 10 MG tablet Take 10 mg by mouth daily.   Yes [provider]  fenofibrate (TRICOR) 145 MG tablet Take 145 mg by mouth daily. 07/14/18  Yes [provider]  folic acid (FOLVITE) 1 MG tablet Take 1 mg by mouth daily.   Yes [provider]  gabapentin (NEURONTIN) 100 MG capsule Take 200 mg by mouth 3 (three) times daily.  10/12/17  Yes [provider]  lisinopril (PRINIVIL,ZESTRIL) 5 MG tablet Take 1 tablet (5 mg total) by mouth daily. 05/23/15  Yes Barrett, Evelene Croon, PA-C  loratadine (CLARITIN) 10 MG tablet Take 10 mg by mouth daily.   Yes [provider]  mirtazapine (REMERON) 7.5 MG tablet Take 3.75 mg by mouth at bedtime.   03/15/17  Yes [provider]  ondansetron (ZOFRAN) 4 MG tablet Take 4 mg by mouth every 8 (eight) hours as needed for nausea or vomiting.   Yes [provider]  vitamin C (ASCORBIC ACID) 500 MG tablet Take 500 mg by mouth daily.   Yes [provider]  zinc sulfate 220 (50 Zn) MG capsule Take 220 mg by mouth daily.   Yes [provider]  carvedilol (COREG) 6.25 MG tablet Take 1 tablet (6.25 mg total) by mouth 2 (two) times daily with a meal. Patient not taking: Reported on 08/24/2018 05/09/15   Geradine Girt, DO  levofloxacin (LEVAQUIN) 500 MG tablet Take 1 tablet (500 mg total) by mouth daily. Patient not taking: Reported on 08/24/2018 07/05/18   [place] Pink,  Trenton Gammon, MD    Physical Exam: Vitals:   08/24/18 1700 08/24/18 1815 08/24/18 1920 08/24/18 2324  BP: (!) 111/57  (!) 141/59 (!) 157/64  Pulse:  63 70 100  Resp: 19 (!) 8 18 18   Temp:   (!) 97.4 F (36.3 C) 98.6 F (37 C)  TempSrc:   Oral Oral  SpO2:  94% 94% 93%    Physical Exam  Constitutional: She is oriented to person, place, and time. No distress.  HENT:  Head: Normocephalic.  Mouth/Throat: Oropharynx is clear and moist.  Eyes: Right eye exhibits no discharge. Left eye exhibits no discharge.  Cardiovascular: Normal rate, regular rhythm and intact distal pulses.  Pulmonary/Chest: Effort normal and breath sounds normal. No respiratory distress. She has no wheezes.  Mild bibasilar rales  Abdominal: Soft. Bowel sounds are normal. She exhibits no distension. There is no tenderness. There is no guarding.  Musculoskeletal: She exhibits no edema.  Neurological: She is alert and oriented to person, place, and time.  Skin: Skin is warm and dry. She is not diaphoretic.  Bilateral heel pressure sores     Labs on Admission: I have personally reviewed following labs and imaging studies  CBC: Recent Labs  Lab 08/24/18 1630  WBC 13.0*  NEUTROABS 7.0  HGB 11.1*  HCT 36.6  MCV 81.3  PLT 407*    Basic Metabolic Panel: Recent Labs  Lab 08/24/18 1630  NA 139  K 3.8  CL 103  CO2 20*  GLUCOSE 102*  BUN 13  CREATININE 1.40*  CALCIUM 9.3   GFR: CrCl cannot be calculated (Unknown ideal weight.). Liver Function Tests: Recent Labs  Lab 08/24/18 1630  AST 24  ALT 10  ALKPHOS 50  BILITOT 1.3*  PROT 7.5  ALBUMIN 2.5*   Recent Labs  Lab 08/24/18 1630  LIPASE 48   No results for input(s): AMMONIA in the last 168 hours. Coagulation Profile: Recent Labs  Lab 08/24/18 1805  INR 1.22   Cardiac Enzymes: No results for input(s): CKTOTAL, CKMB, CKMBINDEX, TROPONINI in the last 168 hours. BNP (last 3 results) No results for input(s): PROBNP in the last 8760 hours. HbA1C: No results for input(s): HGBA1C in the last 72 hours. CBG: Recent Labs  Lab 08/25/18 0026 08/25/18 0028 08/25/18 0045  GLUCAP 21* 57* 94   Lipid Profile: No results for input(s): CHOL, HDL, LDLCALC, TRIG, CHOLHDL, LDLDIRECT in the last 72 hours. Thyroid Function Tests: No results for input(s): TSH, T4TOTAL, FREET4, T3FREE, THYROIDAB in the last 72 hours. Anemia Panel: No results for input(s): VITAMINB12, FOLATE, FERRITIN, TIBC, IRON, RETICCTPCT in the last 72 hours. Urine analysis:    Component Value Date/Time   COLORURINE YELLOW 06/29/2018 1911   APPEARANCEUR CLOUDY (A) 06/29/2018 1911   LABSPEC 1.012 06/29/2018 1911   PHURINE 5.0 06/29/2018 1911   GLUCOSEU NEGATIVE 06/29/2018 1911   HGBUR NEGATIVE 06/29/2018 1911   BILIRUBINUR NEGATIVE 06/29/2018 1911   KETONESUR NEGATIVE 06/29/2018 1911   PROTEINUR NEGATIVE 06/29/2018 1911   UROBILINOGEN 1.0 05/04/2015 1535   NITRITE NEGATIVE 06/29/2018 1911   LEUKOCYTESUR LARGE (A) 06/29/2018 1911    Radiological Exams on Admission: Dg Chest 2 View  Result Date: 08/24/2018 CLINICAL DATA:  Leukocytosis EXAM: CHEST - 2 VIEW COMPARISON:  07/03/2018 FINDINGS: Heart is upper limits normal in size. No confluent airspace opacities, effusions or edema.  No acute bony abnormality. Advanced degenerative changes in the right shoulder. Prior left shoulder replacement. Loop recorder device in the left anterior chest wall. IMPRESSION: No  active cardiopulmonary disease. Electronically Signed   By: Rolm Baptise M.D.   On: 08/24/2018 21:44   Ct Abdomen Pelvis W Contrast  Result Date: 08/24/2018 CLINICAL DATA:  Nausea, vomiting EXAM: CT ABDOMEN AND PELVIS WITH CONTRAST TECHNIQUE: Multidetector CT imaging of the abdomen and pelvis was performed using the standard protocol following bolus administration of intravenous contrast. CONTRAST:  170mL OMNIPAQUE IOHEXOL 300 MG/ML  SOLN COMPARISON:  06/30/2018 FINDINGS: Lower chest: Lung bases are clear. No effusions. Heart is normal size. Hepatobiliary: Small layering stone within the gallbladder fundus. No focal hepatic abnormality or biliary ductal dilatation. Pancreas: Mildly prominent pancreatic duct, with a focal dilated area in the pancreatic body measuring up to 6 mm. Pancreatic atrophy. No focal abnormality. Spleen: No focal abnormality.  Normal size. Adrenals/Urinary Tract: No adrenal abnormality. No focal renal abnormality. No stones or hydronephrosis. Urinary bladder is unremarkable. Stomach/Bowel: Stomach, large and small bowel grossly unremarkable. Vascular/Lymphatic: Aortic atherosclerosis. No enlarged abdominal or pelvic lymph nodes. Reproductive: Uterus and adnexa unremarkable.  No mass. Other: No free fluid or free air. Musculoskeletal: Degenerative changes in the lumbar spine. Leftward scoliosis. No acute bony abnormality. IMPRESSION: Small layering dependent gallstone within the gallbladder. No acute findings in the abdomen or pelvis. Aortic atherosclerosis. Electronically Signed   By: Rolm Baptise M.D.   On: 08/24/2018 19:21    EKG: Independently reviewed.  Sinus rhythm, nonspecific T wave changes, QTC 506.  Assessment/Plan Principal Problem:   Failure to thrive in adult Active Problems:   HTN  (hypertension)   Diabetes mellitus (HCC)   Anxiety and depression   History of stroke   HLD (hyperlipidemia)   Nausea & vomiting   Leukocytosis   Physical deconditioning   Chronic anemia   Hypoglycemia   CKD (chronic kidney disease) stage 3, GFR 30-59 ml/min (HCC)   Dementia (HCC)   Failure to thrive, nausea, vomiting Patient is presenting with progressively worse nausea and vomiting related to eating for the past few months.  She has not been able to tolerate p.o. intake and has been losing weight.  During admission in October, CT showed bowel wall thickening in the terminal ileum suggesting enteritis (infectious versus inflammatory).  Also showed focal thickening of the cecal wall suspicious for a cecal mass. Repeat CT on admission showing small layering stone within the gallbladder fundus, no focal hepatic abnormality or biliary ductal dilation.  Also showing mildly prominent pancreatic duct with a focal dilated area in the pancreatic body measuring up to 6 mm, pancreatic atrophy, no focal abnormality.  White count mildly elevated at 13.0.  Patient is afebrile.  Bicarb is 20, anion gap 16 on admission likely due to lactic acidosis.  Lactate 4.37 >> 1.1 with fluid resuscitation. Procalcitonin <0.10.  Mild leukocytosis likely reactive. Albumin 2.5.  T bili 1.3, remainder of LFTs normal.  Lipase normal.  FOBT negative. -IV fluid hydration -ED physician spoke to Dr. Michail Sermon, GI will see the patient in the morning. -Repeat CBC in a.m. -Nutrition consult -Phenergan PRN nausea  Mild leukocytosis Likely reactive.  Patient is afebrile.  Lactic acidosis resolved after IV fluid resuscitation.  Procalcitonin <0.10.  Patient noted to have mild bibasilar rales on exam.  However, chest x-ray showing no active cardiopulmonary disease. -UA pending  -Repeat CBC in a.m.  Physical deconditioning -PT evaluation  Chronic anemia -Hemoglobin 11.9, stable.  Hypoglycemia, history of type 2  diabetes Patient has a history of type 2 diabetes and is currently not on any home medications.  CBG as low  as 57 in the setting of decreased p.o. Intake.  Received an amp of D50. -D5-LR infusion  -Regular diet -Continue to monitor CBGs  CKD 3 Creatinine 1.4, was 1.2 a month ago. Mild elevation in creatinine likely due to dehydration from decreased p.o. Intake. -IV fluid hydration -Repeat BMP in a.m. -Hold home lisinopril  Bilateral heel pressure sores -Wound care  Anxiety, depression Continue home mirtazapine, Lexapro  History of CVA -Continue home aspirin  Dementia -Continue home Aricept  HTN -Hold home lisinopril  -Hydralazine prn   Hyperlipidemia -Continue home fenofibrate  DVT prophylaxis: Lovenox (renally dosed) Code Status: Patient wishes to be DNR. Family Communication: Daughters at bedside. Disposition Plan: Anticipate discharge to home in 1 to 2 days. Consults called: GI Admission status: Observation   Shela Leff MD [facility] Pager [phone]  If 7PM-7AM, please contact night-coverage [email or web address] Password [email or web address]  08/25/2018, 12:53 AM

## 2018-08-25 NOTE — ED Notes (Signed)
Pt has not been on tele in ED, message sent to admitting requesting order for transport off tele

## 2018-08-25 NOTE — ED Notes (Signed)
Gastroenterology bedside.

## 2018-08-25 NOTE — ED Notes (Signed)
Santiago Glad, NP at the bedside.

## 2018-08-25 NOTE — Evaluation (Signed)
Physical Therapy Evaluation Patient Details Name: Cathy Taylor MRN: [id number] DOB: [birth year] Today's Date: 08/25/2018   History of Present Illness  Pt is an 84 y/o female admitted secondary to failure to thrive and emesis. CT of abdomen revealed small layering dependent gallstone within gallbladder. Per notes, pt likely for EGD on 12/5. PMH includes dementia, HTN, DM, CVA, L TSA, CKD, and R breast cancer.   Clinical Impression  Pt admitted secondary to problem above with deficits below. Unsure of assist required at baseline as pt with dementia. Did report she used WC and required assist for transfers. Reports she had been ambulating as well, but unsure of accuracy. Required max A to perform bed mobility and sit<>Stand transfers. Per pt, she is from SNF. Will continue to follow acutely to maximize functional mobility independence and safety.     Follow Up Recommendations SNF;Supervision/Assistance - 24 hour    Equipment Recommendations  None recommended by PT    Recommendations for Other Services       Precautions / Restrictions Precautions Precautions: Fall Restrictions Weight Bearing Restrictions: No      Mobility  Bed Mobility Overal bed mobility: Needs Assistance Bed Mobility: Supine to Sit;Sit to Supine     Supine to sit: Max assist Sit to supine: Max assist   General bed mobility comments: Max A for assist with LEs and trunk elevation to come to sitting. Also required assist to scoot to EOB using bed pad. Required max A to return to supine as well.   Transfers Overall transfer level: Needs assistance Equipment used: 1 person hand held assist Transfers: Sit to/from Stand Sit to Stand: Max assist         General transfer comment: Max A for lift assist and steadying. PT stood in front of pt and had pt hold onto PT arms to stand.   Ambulation/Gait                Stairs            Wheelchair Mobility    Modified Rankin (Stroke Patients Only)        Balance Overall balance assessment: Needs assistance Sitting-balance support: No upper extremity supported;Feet supported Sitting balance-Leahy Scale: Poor Sitting balance - Comments: REliant on min to min guard A for sitting balance    Standing balance support: Bilateral upper extremity supported Standing balance-Leahy Scale: Poor Standing balance comment: Reliant on UE and external assist to maintain balance.                              Pertinent Vitals/Pain Pain Assessment: No/denies pain    Home Living Family/patient expects to be discharged to:: Skilled nursing facility                 Additional Comments: [facility] pt[facility]    Prior Function Level of Independence: Needs assistance   Gait / Transfers Assistance Needed: Per pt, uses WC for mobility, and reports she needs help getting into and out of WC. Reports she had been able to walk. Unsure of accuracy.   ADL's / Homemaking Assistance Needed: Assume staff assist with ADL tasks        Hand Dominance        Extremity/Trunk Assessment   Upper Extremity Assessment Upper Extremity Assessment: Generalized weakness    Lower Extremity Assessment Lower Extremity Assessment: RLE deficits/detail;LLE deficits/detail RLE Deficits / Details: Lacking ~10-20 degrees of knee extension, secondary to  hamstring tightness. Gross functional weakness noted.  LLE Deficits / Details: Lacking ~10-20 degrees of knee extension, secondary to hamstring tightness. Gross functional weakness noted.     Cervical / Trunk Assessment Cervical / Trunk Assessment: Kyphotic  Communication   Communication: No difficulties  Cognition Arousal/Alertness: Lethargic Behavior During Therapy: Flat affect Overall Cognitive Status: History of cognitive impairments - at baseline                                 General Comments: Pt sleepy initially, however, increased alertness when mobility initiated.  History of dementia at baseline.       General Comments General comments (skin integrity, edema, etc.): No family present to determine baseline.     Exercises     Assessment/Plan    PT Assessment Patient needs continued PT services  PT Problem List Decreased strength;Decreased balance;Decreased mobility;Decreased range of motion;Decreased knowledge of use of DME;Decreased cognition;Decreased safety awareness;Decreased knowledge of precautions       PT Treatment Interventions DME instruction;Gait training;Functional mobility training;Therapeutic activities;Therapeutic exercise;Balance training;Patient/family education;Wheelchair mobility training    PT Goals (Current goals can be found in the Care Plan section)  Acute Rehab PT Goals PT Goal Formulation: Patient unable to participate in goal setting Time For Goal Achievement: 09/08/18 Potential to Achieve Goals: Fair    Frequency Min 2X/week   Barriers to discharge        Co-evaluation               AM-PAC PT "6 Clicks" Mobility  Outcome Measure Help needed turning from your back to your side while in a flat bed without using bedrails?: A Lot Help needed moving from lying on your back to sitting on the side of a flat bed without using bedrails?: A Lot Help needed moving to and from a bed to a chair (including a wheelchair)?: Total Help needed standing up from a chair using your arms (e.g., wheelchair or bedside chair)?: A Lot Help needed to walk in hospital room?: Total Help needed climbing 3-5 steps with a railing? : Total 6 Click Score: 9    End of Session Equipment Utilized During Treatment: Gait belt Activity Tolerance: Patient tolerated treatment well Patient left: in bed;with call bell/phone within reach Nurse Communication: Mobility status PT Visit Diagnosis: Unsteadiness on feet (R26.81);Muscle weakness (generalized) (M62.81);Difficulty in walking, not elsewhere classified (R26.2)    Time: 4315-4008 PT  Time Calculation (min) (ACUTE ONLY): 14 min   Charges:   PT Evaluation $PT Eval Moderate Complexity: [place], PT, DPT  Acute Rehabilitation Services  Pager: [phone] Office: [phone]   Rudean Hitt 08/25/2018, 12:58 PM

## 2018-08-25 NOTE — ED Notes (Signed)
Pt's CBG was 21; stat protocol order placed and Md paged; new orders placed; pt remains A&Ox 4 at this time-Monique,RN

## 2018-08-25 NOTE — ED Notes (Signed)
Daughter @ bedside.

## 2018-08-25 NOTE — H&P (View-Only) (Signed)
Referring Provider: Dr. Eliseo Squires Primary Care Physician:  Maryella Shivers, MD Primary Gastroenterologist:  Dr. Paulita Fujita  Reason for Consultation:  Vomiting; Failure to Thrive  HPI: Cathy Taylor is a 84 y.o. female with multiple medical problems who has been having progressive vomiting after eating small bites of food for the last several months with unintentional weight loss of 20 pounds during that time. Unable to get a reliable history from the patient due to dementia. Daughter at bedside provides the history and denies nausea, abdominal pain, bowel habit changes, or rectal bleeding. Noncontrast CT in October showed bowel wall thickening in the terminal ileum and focal cecal wall thickening concerning for a cecal mass but contrasted CT this admit was negative for acute findings. Daughter thinks she had a colonoscopy about 8 years ago but record not found. Patient is lying in the bed slow to arouse and daughter attributes her somnolence to the Phenergan she is on. Hgb 11.1 (10.1 on 07/05/18).  Past Medical History:  Diagnosis Date  . Abdominal hernia   . Anxiety   . Arthritis   . Breast cancer ([place]) 1973   right  . 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 Bronchiectasis 11/12  . Bronchitis       . Chronic back pain greater than 3 months duration   . Depression   . Enteritis 06/30/2018  . GERD (gastroesophageal reflux disease)   . Heart murmur   . Hypercholesteremia   . Hypertension   . Left ventricular dysfunction 04/2015   EF 30% by TEE, + AK mid-apical anteroseptal and anterior walls plus apex, neg bubble study, grade III plaque asc Ao, suspect ICM  . Osteoarthritis of left shoulder 01/06/2012  . PMR (polymyalgia rheumatica) (HCC)   . Stress fracture of left femur with nonunion 07/08/2011  . Stroke ([place])   . Type II diabetes mellitus ([place])   . Umbilical hernia 2/70/62   unrepaired    Past Surgical History:  Procedure Laterality Date  . BREAST BIOPSY  1981   left  . CATARACT EXTRACTION  W/ INTRAOCULAR LENS IMPLANT  07/2011   left  . EP IMPLANTABLE DEVICE N/A 05/08/2015   Procedure: Loop Recorder Insertion;  Surgeon: Will Meredith Leeds, MD;  Location: [facility];  Service: Cardiovascular;  Laterality: N/A;  . FEMUR SURGERY  07/07/2011   left rod placed  . MASTECTOMY  1973   right  . SHOULDER ARTHROSCOPY  06/2000   left  . TEE WITHOUT CARDIOVERSION N/A 05/07/2015   Procedure: TRANSESOPHAGEAL ECHOCARDIOGRAM (TEE);  Surgeon: Larey Dresser, MD;  EF 30%, +WMA, no thrombus/PFO, suspect ICM    . TOTAL SHOULDER ARTHROPLASTY  01/06/12   left  . TOTAL SHOULDER ARTHROPLASTY  01/06/2012   Procedure: TOTAL SHOULDER ARTHROPLASTY;  Surgeon: Johnny Bridge, MD;  Location: [facility];  Service: Orthopedics;  Laterality: Left;  . TUBAL LIGATION      Prior to Admission medications   Medication Sig Start Date End Date Taking? Authorizing Provider  acetaminophen (TYLENOL) 325 MG tablet Take 650 mg by mouth every 6 (six) hours as needed (for pain).    Yes [provider]  aspirin 81 MG tablet Take 1 tablet (81 mg total) by mouth daily. 04/07/16  Yes Belva Crome, MD  Cholecalciferol (VITAMIN D-3) 1000 units CAPS Take 1,000 Units by mouth daily.   Yes [provider]  donepezil (ARICEPT) 10 MG tablet Take 10 mg by mouth at bedtime.    Yes [provider]  escitalopram (LEXAPRO) 10 MG tablet Take 10 mg by mouth daily.   Yes [provider]  fenofibrate (TRICOR) 145 MG tablet Take 145 mg by mouth daily. 07/14/18  Yes [provider]  folic acid (FOLVITE) 1 MG tablet Take 1 mg by mouth daily.   Yes [provider]  gabapentin (NEURONTIN) 100 MG capsule Take 200 mg by mouth 3 (three) times daily.  10/12/17  Yes [provider]  lisinopril (PRINIVIL,ZESTRIL) 5 MG tablet Take 1 tablet (5 mg total) by mouth daily. 05/23/15  Yes Barrett, Evelene Croon, PA-C  loratadine (CLARITIN) 10 MG tablet Take 10 mg by mouth daily.   Yes [provider]  mirtazapine (REMERON) 7.5 MG tablet Take 3.75 mg by mouth at bedtime.  03/15/17  Yes [provider]  ondansetron (ZOFRAN) 4 MG tablet Take 4 mg by mouth every 8 (eight) hours as needed for nausea or vomiting.   Yes [provider]  vitamin C (ASCORBIC ACID) 500 MG tablet Take 500 mg by mouth daily.   Yes [provider]  zinc sulfate 220 (50 Zn) MG capsule Take 220 mg by mouth daily.   Yes [provider]  carvedilol (COREG) 6.25 MG tablet Take 1 tablet (6.25 mg total) by mouth 2 (two) times daily with a meal. Patient not taking: Reported on 08/24/2018 05/09/15   Geradine Girt, DO  levofloxacin (LEVAQUIN) 500 MG tablet Take 1 tablet (500 mg total) by mouth daily. Patient not taking: Reported on 08/24/2018 07/05/18   Annita Brod, MD    Scheduled Meds: . aspirin EC  81 mg Oral Daily  . cholecalciferol  1,000 Units Oral Daily  . donepezil  10 mg Oral QHS  . enoxaparin (LOVENOX) injection  30 mg Subcutaneous Q24H  . escitalopram  10 mg Oral Daily  . fenofibrate  160 mg Oral Daily  . folic acid  1 mg Oral Daily  . gabapentin  200 mg Oral TID  . loratadine  10 mg Oral Daily  . mirtazapine  3.75 mg Oral QHS  . vitamin C  500 mg Oral Daily  . zinc sulfate  220 mg Oral Daily   Continuous Infusions: . dextrose 5% lactated ringers 100 mL/hr at 08/25/18 0857   PRN Meds:.acetaminophen **OR** acetaminophen, hydrALAZINE, promethazine  Allergies as of 08/24/2018  . (No Known Allergies)    Family History  Problem Relation Age of Onset  . Heart attack Father   . Pulmonary fibrosis Sister   . Stroke Son   . Stroke Maternal Aunt     Social History   Socioeconomic History  . Marital status: Married    Spouse name: Not on file  . Number of children: Not on file  . Years of education: Not on file  . Highest education level: Not on file  Occupational History  . Not on file  Social Needs  . Financial resource strain: Not on file   . Food insecurity:    Worry: Not on file    Inability: Not on file  . Transportation needs:    Medical: Not on file    Non-medical: Not on file  Tobacco Use  . Smoking status: Never Smoker  . Smokeless tobacco: Former Systems developer    Types: Snuff  . Tobacco comment: "just tried smoking; didn't smoke to amount to nothing"  Substance and Sexual Activity  . Alcohol use: No  . Drug use: No  . Sexual activity: Not Currently  Lifestyle  .  Physical activity:    Days per week: Not on file    Minutes per session: Not on file  . Stress: Not on file  Relationships  . Social connections:    Talks on phone: Not on file    Gets together: Not on file    Attends religious service: Not on file    Active member of club or organization: Not on file    Attends meetings of clubs or organizations: Not on file    Relationship status: Not on file  . Intimate partner violence:    Fear of current or ex partner: Not on file    Emotionally abused: Not on file    Physically abused: Not on file    Forced sexual activity: Not on file  Other Topics Concern  . Not on file  Social History Narrative   Currently lives with her husband who has Parkinson's Disease and Dementia. Their children live nearby. They have home health caregivers who assist them throughout the week as well.    Review of Systems: All negative except as stated above in HPI.  Physical Exam: Vital signs: Vitals:   08/24/18 2324 08/25/18 0543  BP: (!) 157/64 (!) 122/44  Pulse: 100 70  Resp: 18   Temp: 98.6 F (37 C)   SpO2: 93% 100%     General:  Somnolent, elderly, frail, no acute distress Head: normocephalic, atraumatic Eyes: anicteric sclera ENT: oropharynx clear Neck: supple, nontender Lungs:  Clear throughout to auscultation.   No wheezes, crackles, or rhonchi. No acute distress. Heart:  Regular rate and rhythm; no murmurs, clicks, rubs,  or gallops. Abdomen: diffuse tenderness with minimal guarding, soft, nondistended, +BS   Rectal:  Deferred Ext: no edema  GI:  Lab Results: Recent Labs    08/24/18 1630  WBC 13.0*  HGB 11.1*  HCT 36.6  PLT 407*   BMET Recent Labs    08/24/18 1630  NA 139  K 3.8  CL 103  CO2 20*  GLUCOSE 102*  BUN 13  CREATININE 1.40*  CALCIUM 9.3   LFT Recent Labs    08/24/18 1630  PROT 7.5  ALBUMIN 2.5*  AST 24  ALT 10  ALKPHOS 50  BILITOT 1.3*   PT/INR Recent Labs    08/24/18 1805  LABPROT 15.3*  INR 1.22     Studies/Results: Dg Chest 2 View  Result Date: 08/24/2018 CLINICAL DATA:  Leukocytosis EXAM: CHEST - 2 VIEW COMPARISON:  07/03/2018 FINDINGS: Heart is upper limits normal in size. No confluent airspace opacities, effusions or edema. No acute bony abnormality. Advanced degenerative changes in the right shoulder. Prior left shoulder replacement. Loop recorder device in the left anterior chest wall. IMPRESSION: No active cardiopulmonary disease. Electronically Signed   By: Rolm Baptise M.D.   On: 08/24/2018 21:44   Ct Abdomen Pelvis W Contrast  Result Date: 08/24/2018 CLINICAL DATA:  Nausea, vomiting EXAM: CT ABDOMEN AND PELVIS WITH CONTRAST TECHNIQUE: Multidetector CT imaging of the abdomen and pelvis was performed using the standard protocol following bolus administration of intravenous contrast. CONTRAST:  183mL OMNIPAQUE IOHEXOL 300 MG/ML  SOLN COMPARISON:  06/30/2018 FINDINGS: Lower chest: Lung bases are clear. No effusions. Heart is normal size. Hepatobiliary: Small layering stone within the gallbladder fundus. No focal hepatic abnormality or biliary ductal dilatation. Pancreas: Mildly prominent pancreatic duct, with a focal dilated area in the pancreatic body measuring up to 6 mm. Pancreatic atrophy. No focal abnormality. Spleen: No focal abnormality.  Normal size. Adrenals/Urinary Tract: No  adrenal abnormality. No focal renal abnormality. No stones or hydronephrosis. Urinary bladder is unremarkable. Stomach/Bowel: Stomach, large and small bowel grossly  unremarkable. Vascular/Lymphatic: Aortic atherosclerosis. No enlarged abdominal or pelvic lymph nodes. Reproductive: Uterus and adnexa unremarkable.  No mass. Other: No free fluid or free air. Musculoskeletal: Degenerative changes in the lumbar spine. Leftward scoliosis. No acute bony abnormality. IMPRESSION: Small layering dependent gallstone within the gallbladder. No acute findings in the abdomen or pelvis. Aortic atherosclerosis. Electronically Signed   By: Rolm Baptise M.D.   On: 08/24/2018 19:21    Impression/Plan: Progressive vomiting with minimal PO intake with weight loss and weakness and failure to thrive. EGD tomorrow to look for gastric outlet obstruction or other source of her recurrent vomiting. I do not think she could undergo a colonoscopy prep and with the negative contrasted CT scan would not recommend a colonoscopy due to her advanced age and comorbidities. NPO. EGD tomorrow. Risks/benefits of EGD discussed with daughter who agrees to proceed.    LOS: 0 days   [name]  08/25/2018, 9:27 AM  Questions please call [phone]

## 2018-08-25 NOTE — ED Notes (Signed)
Dr. Lucianne Lei hospitalist bedside.

## 2018-08-25 NOTE — ED Notes (Signed)
Dr. Eliseo Squires advised okay to place order to transport off monitor.

## 2018-08-25 NOTE — NC FL2 (Signed)
[place] MEDICAID FL2 LEVEL OF CARE SCREENING TOOL     IDENTIFICATION  Patient Name: Cathy Taylor Birthdate: 1932-11-27 Sex: female Admission Date (Current Location): 08/24/2018  [facility] and [place] Number:  Herbalist and Address:  [facility], [place] [address] [place], [place] [address]      Provider Number: [id number]  Attending Physician Name and Address:  Geradine Girt, DO  Relative Name and Phone Number:       Current Level of Care: Hospital Recommended Level of Care: [place] Prior Approval Number:    Date Approved/Denied:   PASRR Number:    Discharge Plan: SNF    Current Diagnoses: Patient Active Problem List   Diagnosis Date Noted  . Leukocytosis 08/25/2018  . Physical deconditioning 08/25/2018  . Chronic anemia 08/25/2018  . Hypoglycemia 08/25/2018  . CKD (chronic kidney disease) stage 3, GFR 30-59 ml/min (HCC) 08/25/2018  . Dementia ([place]) 08/25/2018  . Total bilirubin, elevated 08/25/2018  . Hypomagnesemia 08/25/2018  . Failure to thrive in adult 08/24/2018  . Vascular dementia with behavior disturbance ([place]) 07/02/2018  . Nausea & vomiting 06/30/2018  . Advanced care planning/counseling discussion   . Goals of care, counseling/discussion   . Palliative care by specialist   . History of stroke 10/21/2017  . HLD (hyperlipidemia) 10/21/2017  . Status post placement of implantable loop recorder 04/08/2017  . Debility   . Acute encephalopathy   . Anxiety and depression 09/03/2015  . Right leg weakness 09/03/2015  . Aphasia 09/03/2015  . Nausea 09/03/2015  . Hypokalemia 09/03/2015  . Stroke (cerebrum) ([place]) 09/03/2015  . Slurred speech   . Elevated troponin 05/06/2015  . Cardiomyopathy ([place]) 05/06/2015  . Altered mental status 05/04/2015  . Vertigo 05/04/2015  . Acute ischemic stroke ([place]) 05/04/2015  . Encephalopathy acute   . CVA (cerebral vascular accident) ([place])   . Memory loss 06/01/2014   . Numbness 06/01/2014  . Osteoarthritis of left shoulder 01/06/2012  . Bronchiectasis ([place]) 10/10/2011  . Hyponatremia 09/12/2011  . Dyspnea 09/10/2011  . HTN (hypertension) 09/10/2011  . Diabetes mellitus ([place]) 09/10/2011  . PMR (polymyalgia rheumatica) ([place]) 09/10/2011  . Breast cancer ([place]) 09/10/2011    Orientation RESPIRATION BLADDER Height & Weight     Self, Time, Situation, Place  Normal Continent Weight:   Height:     BEHAVIORAL SYMPTOMS/MOOD NEUROLOGICAL BOWEL NUTRITION STATUS      Incontinent Diet(NPO at this time, please see d/c summary at d/c.)  AMBULATORY STATUS COMMUNICATION OF NEEDS Skin   Extensive Assist Verbally Normal                       Personal Care Assistance Level of Assistance  Dressing, Bathing, Feeding Bathing Assistance: Maximum assistance Feeding assistance: Limited assistance Dressing Assistance: Maximum assistance     Functional Limitations Info  Sight, Hearing, Speech Sight Info: Adequate Hearing Info: Adequate Speech Info: Adequate    SPECIAL CARE FACTORS FREQUENCY  PT (By licensed PT), OT (By licensed OT)     PT Frequency: 2x OT Frequency: 2x            Contractures Contractures Info: Not present    Additional Factors Info  Code Status, Allergies Code Status Info: DNR Allergies Info: No known allergies           Current Medications (08/25/2018):  This is the current hospital active medication list Current Facility-Administered Medications  Medication Dose Route Frequency Provider Last Rate Last  Dose  . acetaminophen (TYLENOL) tablet 650 mg  650 mg Oral Q6H PRN Shela Leff, MD       Or  . acetaminophen (TYLENOL) suppository 650 mg  650 mg Rectal Q6H PRN Shela Leff, MD      . aspirin EC tablet 81 mg  81 mg Oral Daily Shela Leff, MD   81 mg at 08/24/18 2200  . cholecalciferol (VITAMIN D3) tablet 1,000 Units  1,000 Units Oral Daily Shela Leff, MD   1,000 Units at 08/24/18 2145  .  donepezil (ARICEPT) tablet 10 mg  10 mg Oral QHS Shela Leff, MD   10 mg at 08/24/18 2143  . enoxaparin (LOVENOX) injection 30 mg  30 mg Subcutaneous Q24H Shela Leff, MD   30 mg at 08/24/18 2140  . escitalopram (LEXAPRO) tablet 10 mg  10 mg Oral Daily Shela Leff, MD   10 mg at 08/24/18 2144  . fenofibrate tablet 160 mg  160 mg Oral Daily Shela Leff, MD   160 mg at 08/24/18 2142  . folic acid (FOLVITE) tablet 1 mg  1 mg Oral Daily Shela Leff, MD   1 mg at 08/24/18 2145  . gabapentin (NEURONTIN) capsule 200 mg  200 mg Oral TID Shela Leff, MD   200 mg at 08/24/18 2144  . hydrALAZINE (APRESOLINE) injection 5 mg  5 mg Intravenous Q4H PRN Shela Leff, MD      . loratadine (CLARITIN) tablet 10 mg  10 mg Oral Daily Shela Leff, MD   10 mg at 08/24/18 2145  . magnesium sulfate IVPB 2 g 50 mL  2 g Intravenous Once Black, Karen M, NP      . mirtazapine (REMERON) tablet 3.75 mg  3.75 mg Oral QHS Shela Leff, MD   3.75 mg at 08/24/18 2144  . ondansetron (ZOFRAN) injection 4 mg  4 mg Intravenous Q8H PRN Wilford Corner, MD      . potassium chloride SA (K-DUR,KLOR-CON) CR tablet 40 mEq  40 mEq Oral Once Black, Karen M, NP      . vitamin C (ASCORBIC ACID) tablet 500 mg  500 mg Oral Daily Shela Leff, MD   500 mg at 08/24/18 2145  . zinc sulfate capsule 220 mg  220 mg Oral Daily Shela Leff, MD   220 mg at 08/24/18 2142     Discharge Medications: Please see discharge summary for a list of discharge medications.  Relevant Imaging Results:  Relevant Lab Results:   Additional Information SS#[phone]  Contact precaution, MRSA  [name] A Tyronica Truxillo, LCSW

## 2018-08-25 NOTE — Progress Notes (Signed)
H&P after midnight. In brief  84 yo hx abdominal hernia, htn, chf, ckd 3, cva DM recent UTI and ischemic versus infectious colitis and aki presents with persistent worsening n/v and weight loss. CT in October suggested terminal ileal inflammation but some concern for cecal mass. CT this admission negative for acute findings. Evaluated by GI who recommend EGD tomorrow to look for gastric outlet obstruction or other source of her recurrent vomiting.   Of note she has required phenergan to manage vomiting and she is quite lethargic at time of exam.  Work up reveals hypokalemia, hypomagnesemia, elevated total bili and hypoglycemia  PE Gen; lying in bed eyes closed arouses to verbal stimuli Resp: sligtly shallow BS with fair air movement no crackles CV: rrr no mgr no LE edema Abd: non-distended +BS but sluggish no guarding or rebounding   Will replete electrolytes, discontinue phenergan. Monitor.     Santiago Glad m. Black, NP

## 2018-08-26 ENCOUNTER — Inpatient Hospital Stay (HOSPITAL_COMMUNITY): Payer: Medicare Other | Admitting: Anesthesiology

## 2018-08-26 ENCOUNTER — Encounter (HOSPITAL_COMMUNITY)
Admission: EM | Disposition: A | Discharge status: Skilled Nursing Facility | Payer: Self-pay | Source: Home / Self Care | Attending: Internal Medicine

## 2018-08-26 ENCOUNTER — Encounter (HOSPITAL_COMMUNITY): Payer: Self-pay | Admitting: *Deleted

## 2018-08-26 DIAGNOSIS — E43 Unspecified severe protein-calorie malnutrition: Secondary | ICD-10-CM

## 2018-08-26 HISTORY — PX: BIOPSY: SHX5522

## 2018-08-26 HISTORY — PX: ESOPHAGOGASTRODUODENOSCOPY (EGD) WITH PROPOFOL: SHX5813

## 2018-08-26 LAB — GLUCOSE, CAPILLARY
GLUCOSE-CAPILLARY: 83 mg/dL (ref 70–99)
Glucose-Capillary: 100 mg/dL — ABNORMAL HIGH (ref 70–99)
Glucose-Capillary: 101 mg/dL — ABNORMAL HIGH (ref 70–99)
Glucose-Capillary: 111 mg/dL — ABNORMAL HIGH (ref 70–99)
Glucose-Capillary: 115 mg/dL — ABNORMAL HIGH (ref 70–99)
Glucose-Capillary: 118 mg/dL — ABNORMAL HIGH (ref 70–99)
Glucose-Capillary: 21 mg/dL — CL (ref 70–99)
Glucose-Capillary: 33 mg/dL — CL (ref 70–99)
Glucose-Capillary: 90 mg/dL (ref 70–99)

## 2018-08-26 LAB — CBC
HCT: 26.4 % — ABNORMAL LOW (ref 36.0–46.0)
HCT: 26.5 % — ABNORMAL LOW (ref 36.0–46.0)
Hemoglobin: 8.8 g/dL — ABNORMAL LOW (ref 12.0–15.0)
Hemoglobin: 8.5 g/dL — ABNORMAL LOW (ref 12.0–15.0)
MCH: 25.8 pg — ABNORMAL LOW (ref 26.0–34.0)
MCH: 25.1 pg — ABNORMAL LOW (ref 26.0–34.0)
MCHC: 33.3 g/dL (ref 30.0–36.0)
MCHC: 32.1 g/dL (ref 30.0–36.0)
MCV: 77.4 fL — ABNORMAL LOW (ref 80.0–100.0)
MCV: 78.4 fL — ABNORMAL LOW (ref 80.0–100.0)
Platelets: 379 10*3/uL (ref 150–400)
Platelets: 291 10*3/uL (ref 150–400)
RBC: 3.41 MIL/uL — ABNORMAL LOW (ref 3.87–5.11)
RBC: 3.38 MIL/uL — ABNORMAL LOW (ref 3.87–5.11)
RDW: 22.1 % — ABNORMAL HIGH (ref 11.5–15.5)
RDW: 22.2 % — ABNORMAL HIGH (ref 11.5–15.5)
WBC: 8.6 10*3/uL (ref 4.0–10.5)
WBC: 8.2 10*3/uL (ref 4.0–10.5)
nRBC: 1.5 % — ABNORMAL HIGH (ref 0.0–0.2)
nRBC: 1.6 % — ABNORMAL HIGH (ref 0.0–0.2)

## 2018-08-26 LAB — BASIC METABOLIC PANEL
ANION GAP: 10 (ref 5–15)
BUN: 8 mg/dL (ref 8–23)
CO2: 22 mmol/L (ref 22–32)
Calcium: 8.6 mg/dL — ABNORMAL LOW (ref 8.9–10.3)
Chloride: 110 mmol/L (ref 98–111)
Creatinine, Ser: 0.94 mg/dL (ref 0.44–1.00)
GFR calc Af Amer: >60 mL/min (ref >60)
GFR calc non Af Amer: 56 mL/min — ABNORMAL LOW (ref >60)
GLUCOSE: 100 mg/dL — AB (ref 70–99)
Potassium: 3.1 mmol/L — ABNORMAL LOW (ref 3.5–5.1)
Sodium: 142 mmol/L (ref 135–145)

## 2018-08-26 SURGERY — ESOPHAGOGASTRODUODENOSCOPY (EGD) WITH PROPOFOL
Anesthesia: General

## 2018-08-26 MED ORDER — POVIDONE-IODINE 10 % EX SOLN
Freq: Two times a day (BID) | CUTANEOUS | Status: DC
  Administered 2018-08-26 – 2018-08-31 (×10): via TOPICAL
  Administered 2018-08-31 – 2018-09-01 (×3): 1 via TOPICAL
  Administered 2018-09-02: 22:00:00 via TOPICAL
  Administered 2018-09-02: 1 via TOPICAL
  Administered 2018-09-03: 23:00:00 via TOPICAL
  Administered 2018-09-03: 1 via TOPICAL
  Administered 2018-09-04 – 2018-09-07 (×7): via TOPICAL
  Filled 2018-08-26: qty 118

## 2018-08-26 MED ORDER — PANTOPRAZOLE SODIUM 40 MG IV SOLR
40.0000 mg | Freq: Two times a day (BID) | INTRAVENOUS | Status: DC
  Administered 2018-08-29: 40 mg via INTRAVENOUS
  Filled 2018-08-26: qty 40

## 2018-08-26 MED ORDER — DIPHENHYDRAMINE HCL 25 MG PO CAPS
25.0000 mg | ORAL_CAPSULE | Freq: Four times a day (QID) | ORAL | Status: DC | PRN
  Administered 2018-08-26: 25 mg via ORAL
  Filled 2018-08-26: qty 1

## 2018-08-26 MED ORDER — PROPOFOL 10 MG/ML IV BOLUS
INTRAVENOUS | Status: DC | PRN
  Administered 2018-08-26: 50 mg via INTRAVENOUS

## 2018-08-26 MED ORDER — KCL IN DEXTROSE-NACL 20-5-0.45 MEQ/L-%-% IV SOLN
INTRAVENOUS | Status: DC
  Administered 2018-08-26 – 2018-08-29 (×6): via INTRAVENOUS
  Filled 2018-08-26 (×7): qty 1000

## 2018-08-26 MED ORDER — SODIUM CHLORIDE 0.9 % IV SOLN
8.0000 mg/h | INTRAVENOUS | Status: DC
  Administered 2018-08-26 – 2018-08-29 (×6): 8 mg/h via INTRAVENOUS
  Filled 2018-08-26 (×8): qty 80

## 2018-08-26 MED ORDER — LIDOCAINE 2% (20 MG/ML) 5 ML SYRINGE
INTRAMUSCULAR | Status: DC | PRN
  Administered 2018-08-26: 60 mg via INTRAVENOUS

## 2018-08-26 MED ORDER — COLLAGENASE 250 UNIT/GM EX OINT
TOPICAL_OINTMENT | Freq: Every day | CUTANEOUS | Status: DC
  Administered 2018-08-26 – 2018-08-31 (×6): via TOPICAL
  Administered 2018-09-01 – 2018-09-02 (×2): 1 via TOPICAL
  Administered 2018-09-03 – 2018-09-07 (×5): via TOPICAL
  Filled 2018-08-26: qty 30

## 2018-08-26 MED ORDER — ONDANSETRON HCL 4 MG/2ML IJ SOLN
INTRAMUSCULAR | Status: DC | PRN
  Administered 2018-08-26: 4 mg via INTRAVENOUS

## 2018-08-26 MED ORDER — GLUCOSE 40 % PO GEL: 1.0000 | Freq: Once | ORAL | Status: DC | PRN

## 2018-08-26 MED ORDER — SUCCINYLCHOLINE CHLORIDE 20 MG/ML IJ SOLN
INTRAMUSCULAR | Status: DC | PRN
  Administered 2018-08-26: 80 mg via INTRAVENOUS

## 2018-08-26 MED ORDER — SODIUM CHLORIDE 0.9 % IV SOLN
80.0000 mg | Freq: Once | INTRAVENOUS | Status: AC
  Administered 2018-08-26: 80 mg via INTRAVENOUS
  Filled 2018-08-26: qty 80

## 2018-08-26 MED ORDER — LACTATED RINGERS IV SOLN
INTRAVENOUS | Status: DC | PRN
  Administered 2018-08-26: 08:00:00 via INTRAVENOUS

## 2018-08-26 MED ORDER — SODIUM CHLORIDE 0.9 % IV SOLN: INTRAVENOUS | Status: DC

## 2018-08-26 SURGICAL SUPPLY — 15 items

## 2018-08-26 NOTE — Progress Notes (Addendum)
08/26/2018 Patient have not voided. Bladder scan perform she had 510 cc in bladder. Dr Posey Pronto was made aware and Rn was given one time order for in and out cath. In and out cath perform she had 400 cc out. Rico Sheehan RN

## 2018-08-26 NOTE — Progress Notes (Signed)
Pt BS was 33 at 2330. Pt was lethargic and difficult to wake. Charge nurse and myself placed POCT order and 50 mg D5 IV push was ordered. D5 was given over 3 minutes at 2358. CBG was rechecked at 1220 result was 115. Pt was arousable and talking a little and color was better.

## 2018-08-26 NOTE — Interval H&P Note (Signed)
History and Physical Interval Note:  08/26/2018 8:37 AM  Cathy Taylor  has presented today for surgery, with the diagnosis of vomiting  The various methods of treatment have been discussed with the patient and family. After consideration of risks, benefits and other options for treatment, the patient has consented to  Procedure(s): ESOPHAGOGASTRODUODENOSCOPY (EGD) WITH PROPOFOL (N/A) as a surgical intervention .  The patient's history has been reviewed, patient examined, no change in status, stable for surgery.  I have reviewed the patient's chart and labs.  Questions were answered to the patient's satisfaction.     [name]

## 2018-08-26 NOTE — Progress Notes (Signed)
12/5 2019 family called patient left and right arm have some redness.  Both arm were warm. DR Posey Pronto was made aware and order order was given for benadryl and warm heat to site. [facility] RN.

## 2018-08-26 NOTE — Anesthesia Postprocedure Evaluation (Signed)
Anesthesia Post Note  Patient: Cathy Taylor  Procedure(s) Performed: ESOPHAGOGASTRODUODENOSCOPY (EGD) WITH PROPOFOL (N/A ) BIOPSY     Patient location during evaluation: PACU Anesthesia Type: General Level of consciousness: awake and alert Pain management: pain level controlled Vital Signs Assessment: post-procedure vital signs reviewed and stable Respiratory status: spontaneous breathing, nonlabored ventilation, respiratory function stable and patient connected to nasal cannula oxygen Cardiovascular status: blood pressure returned to baseline and stable Postop Assessment: no apparent nausea or vomiting Anesthetic complications: no    Last Vitals:  Vitals:   08/26/18 0930 08/26/18 0931  BP: (!) 166/54 (!) 166/54  Pulse: 65 65  Resp: 14 14  Temp:  36.7 C  SpO2: 100% 100%    Last Pain:  Vitals:   08/26/18 0938  TempSrc:   PainSc: (P) 0-No pain                 Darryel Diodato S

## 2018-08-26 NOTE — Transfer of Care (Signed)
Immediate Anesthesia Transfer of Care Note  Patient: Cathy Taylor  Procedure(s) Performed: ESOPHAGOGASTRODUODENOSCOPY (EGD) WITH PROPOFOL (N/A ) BIOPSY  Patient Location: Endoscopy Unit  Anesthesia Type:General  Level of Consciousness: awake, alert  and oriented  Airway & Oxygen Therapy: Patient Spontanous Breathing and Patient connected to nasal cannula oxygen  Post-op Assessment: Report given to RN and Patient moving all extremities X 4  Post vital signs: Reviewed and stable  Last Vitals:  Vitals Value Taken Time  BP 166/54 08/26/2018  9:31 AM  Temp 36.7 C 08/26/2018  9:31 AM  Pulse 68 08/26/2018  9:32 AM  Resp 12 08/26/2018  9:32 AM  SpO2 100 % 08/26/2018  9:32 AM  Vitals shown include unvalidated device data.  Last Pain:  Vitals:   08/26/18 0931  TempSrc: Oral  PainSc: 0-No pain         Complications: No apparent anesthesia complications

## 2018-08-26 NOTE — Progress Notes (Signed)
[facility] Progress Note  Patient: Cathy Taylor BOF:[id number]   PCP: Maryella Shivers, MD DOB: 09-23-1932   DOA: 08/24/2018   DOS: 08/26/2018   Date of Service: the patient was seen and examined on 08/26/2018  Brief hospital course: Pt. with PMH of abdominal hernia, anxiety, depression, GERD, hypertension, hyperlipidemia, CHF, CKD 3, CVA, type 2 diabetes ; admitted on 08/24/2018, presented with complaint of nausea vomiting and weight loss, was found to have gastritis and duodenitis. Currently further plan is further supportive measures.  Subjective: No acute complaint no nausea no vomiting no fever no chills.  Assessment and Plan: Failure to thrive, nausea, vomiting Patient is presenting with progressively worse nausea and vomiting related to eating for the past few months.  She has not been able to tolerate p.o. intake and has been losing weight.  During admission in October, CT showed bowel wall thickening in the terminal ileum suggesting enteritis (infectious versus inflammatory).  Also showed focal thickening of the cecal wall suspicious for a cecal mass. Repeat CT on admission showing small layering stone within the gallbladder fundus, no focal hepatic abnormality or biliary ductal dilation.  Also showing mildly prominent pancreatic duct with a focal dilated area in the pancreatic body measuring up to 6 mm, pancreatic atrophy, no focal abnormality.  White count mildly elevated at 13.0.  Patient is afebrile.  Bicarb is 20, anion gap 16 on admission likely due to lactic acidosis.  Lactate 4.37 >> 1.1 with fluid resuscitation. Procalcitonin <0.10.  Mild leukocytosis likely reactive. Albumin 2.5.  T bili 1.3, remainder of LFTs normal.  Lipase normal.  FOBT negative. Continue IV fluids. Underwent EGD which showed severe gastritis and duodenitis with nonbleeding ulcers. On Protonix drip. Monitor H&H daily.  Mild leukocytosis Likely reactive.  Patient is afebrile.  Lactic acidosis resolved  after IV fluid resuscitation.  Procalcitonin <0.10.  Patient noted to have mild bibasilar rales on exam.  However, chest x-ray showing no active cardiopulmonary disease. -UA pending  -Repeat CBC in a.m.  Physical deconditioning -PT evaluation  Chronic anemia -Hemoglobin 11.9, stable.  Hypoglycemia, history of type 2 diabetes Patient has a history of type 2 diabetes and is currently not on any home medications.  CBG as low as 57 in the setting of decreased p.o. Intake.  Received an amp of D50. -D5-LR infusion  -Regular diet -Continue to monitor CBGs  CKD 3 Creatinine 1.4, was 1.2 a month ago. Mild elevation in creatinine likely due to dehydration from decreased p.o. Intake. -IV fluid hydration -Repeat BMP in a.m. -Hold home lisinopril  Bilateral heel pressure sores -Wound care  Anxiety, depression Continue home mirtazapine, Lexapro  History of CVA -Continue home aspirin  Dementia -Continue home Aricept  HTN -Hold home lisinopril  -Hydralazine prn   Hyperlipidemia -Continue home fenofibrate  Malnutrition Type: Nutrition Problem: Severe Malnutrition Etiology: acute illness(severe duodenitis)  Diet: clear liquid DVT Prophylaxis: mechanical compression device  Advance goals of care discussion: DNR DNI  Family Communication: family was present at bedside, at the time of interview. The pt provided permission to discuss medical plan with the family. Opportunity was given to ask question and all questions were answered satisfactorily.   Disposition:  Discharge to home.  Consultants: gastroenterology  Procedures: EGD  Scheduled Meds: . collagenase   Topical Daily  . donepezil  10 mg Oral QHS  . escitalopram  10 mg Oral Daily  . gabapentin  100 mg Oral TID  . mirtazapine  3.75 mg Oral QHS  . [START ON 08/29/2018]  pantoprazole  40 mg Intravenous Q12H  . povidone-iodine   Topical BID   Continuous Infusions: . dextrose 5 % and 0.45 % NaCl with KCl 20  mEq/L 75 mL/hr at 08/26/18 1305  . pantoprozole (PROTONIX) infusion 8 mg/hr (08/26/18 1401)   PRN Meds: acetaminophen **OR** acetaminophen, dextrose, diphenhydrAMINE, hydrALAZINE, ondansetron (ZOFRAN) IV Antibiotics: Anti-infectives (From admission, onward)   None       Objective: Physical Exam: Vitals:   08/26/18 0931 08/26/18 0940 08/26/18 0942 08/26/18 1813  BP: (!) 166/54 (!) 159/57 (!) 159/57 (!) 110/54  Pulse: 65 66 66 74  Resp: 14 11 11    Temp: 98 F (36.7 C)   (!) 97.2 F (36.2 C)  TempSrc: Oral     SpO2: 100% 100% 98% 100%  Weight:      Height:        Intake/Output Summary (Last 24 hours) at 08/26/2018 1853 Last data filed at 08/26/2018 0932 Gross per 24 hour  Intake 642.35 ml  Output 600 ml  Net 42.35 ml   Filed Weights   08/26/18 0727  Weight: 57.6 kg   General: Alert, Awake and Oriented to Time, Place and Person. Appear in mild distress, affect appropriate Eyes: PERRL, Conjunctiva normal ENT: Oral Mucosa clear moist. Neck: no JVD, no Abnormal Mass Or lumps Cardiovascular: S1 and S2 Present, no Murmur, Peripheral Pulses Present Respiratory: normal respiratory effort, Bilateral Air entry equal and Decreased, no use of accessory muscle, Clear to Auscultation, no Crackles, no wheezes Abdomen: Bowel Sound present, Soft and no tenderness, no hernia Skin: no redness, no Rash, no induration Extremities: no Pedal edema, no calf tenderness Neurologic: Grossly no focal neuro deficit. Bilaterally Equal motor strength  Data Reviewed: CBC: Recent Labs  Lab 08/24/18 1630 08/25/18 1238 08/25/18 1428 08/26/18 0352 08/26/18 1448  WBC 13.0* 8.7 9.9 8.6 8.2  NEUTROABS 7.0  --   --   --   --   HGB 11.1* 7.9* 8.8* 8.8* 8.5*  HCT 36.6 24.8* 26.9* 26.4* 26.5*  MCV 81.3 80.3 78.7* 77.4* 78.4*  PLT 407* 345 363 379 161   Basic Metabolic Panel: Recent Labs  Lab 08/24/18 1630 08/25/18 1238 08/25/18 1428 08/25/18 1939 08/26/18 0352  NA 139 139 142  --  142  K 3.8  3.2* 3.0* 3.2* 3.1*  CL 103 108 110  --  110  CO2 20* 21* 24  --  22  GLUCOSE 102* 701* 147*  --  100*  BUN 13 7* 9  --  8  CREATININE 1.40* 1.05* 1.08*  --  0.94  CALCIUM 9.3 8.3* 8.7*  --  8.6*    Liver Function Tests: Recent Labs  Lab 08/24/18 1630  AST 24  ALT 10  ALKPHOS 50  BILITOT 1.3*  PROT 7.5  ALBUMIN 2.5*   Recent Labs  Lab 08/24/18 1630  LIPASE 48   No results for input(s): AMMONIA in the last 168 hours. Coagulation Profile: Recent Labs  Lab 08/24/18 1805  INR 1.22   Cardiac Enzymes: No results for input(s): CKTOTAL, CKMB, CKMBINDEX, TROPONINI in the last 168 hours. BNP (last 3 results) No results for input(s): PROBNP in the last 8760 hours. CBG: Recent Labs  Lab 08/26/18 0025 08/26/18 0241 08/26/18 0359 08/26/18 1014 08/26/18 1812  GLUCAP 115* 111* 101* 83 118*   Studies: No results found.   Time spent: 35 minutes  Author: Berle Mull, MD [facility] Pager: [phone] 08/26/2018 6:53 PM  Between 7PM-7AM, please contact night-coverage at [email or web address], password [facility]

## 2018-08-26 NOTE — Consult Note (Signed)
[place] Nurse wound consult note Reason for Consult: right lateral foot ulcer, full thickness, Unstageable Wound type:Pressure Pressure Injury POA: yes, per patient's family Measurement:1cm x 0.5cm with yellow fibrinous slough obscuring wound bed Wound bed:as described above Drainage (amount, consistency, odor) small amount of yellow exudate on old dressing consistent with autolytically debriding slough Periwound:intact, dry Dressing procedure/placement/frequency: I will continue the POC implemented by the facility and use collagenase (Santyl) once daily. Nursing is provided with guidance via the Orders.   [place] nursing team will not follow, but will remain available to this patient, the nursing and medical teams.  Please re-consult if needed. Thanks, Maudie Flakes, MSN, RN, [place], Arther Abbott  Pager# [phone]

## 2018-08-26 NOTE — Op Note (Signed)
[facility] Patient Name: Cathy Taylor Procedure Date : 08/26/2018 MRN: [id number] Attending MD: [name] , MD Date of Birth: 11-Sep-1933 CSN: [id number] Age: 84 Admit Type: Inpatient Procedure:                Upper GI endoscopy Indications:              Iron deficiency anemia, Failure to thrive, Nausea                            with vomiting Providers:                [name], MD, Angus Seller, Cherylynn Ridges, Technician, Rhae Lerner, CRNA Referring MD:             hospital team Medicines:                General Anesthesia Complications:            No immediate complications. Estimated Blood Loss:     Estimated blood loss was minimal. Procedure:                Pre-Anesthesia Assessment:                           - Prior to the procedure, a History and Physical                            was performed, and patient medications and                            allergies were reviewed. The patient's tolerance of                            previous anesthesia was also reviewed. The risks                            and benefits of the procedure and the sedation                            options and risks were discussed with the patient.                            All questions were answered, and informed consent                            was obtained. Prior Anticoagulants: The patient has                            taken no previous anticoagulant or antiplatelet                            agents. ASA Grade Assessment: III - A patient with  severe systemic disease. After reviewing the risks                            and benefits, the patient was deemed in                            satisfactory condition to undergo the procedure.                           After obtaining informed consent, the endoscope was                            passed under direct vision. Throughout the   procedure, the patient's blood pressure, pulse, and                            oxygen saturations were monitored continuously. The                            [id number]-H190 ([id number] Olympus adult EGD was introduced                            through the mouth, and advanced to the second part                            of duodenum. The upper GI endoscopy was                            accomplished without difficulty. The patient                            tolerated the procedure well. Scope In: Scope Out: Findings:      One superficial esophageal ulcer with no bleeding and no stigmata of       recent bleeding was found at the gastroesophageal junction. The lesion       was 10 mm in largest dimension.      One superficial esophageal ulcer with no bleeding and no stigmata of       recent bleeding was found in the mid esophagus. The lesion was 14 mm in       largest dimension.      The Z-line was found 35 cm from the incisors.      Many oozing cratered gastric ulcers were found on the greater curvature       of the stomach. The largest lesion was 10 mm in largest dimension.      Patchy severe inflammation characterized by congestion (edema), erosions       and erythema was found on the greater curvature of the stomach and in       the gastric antrum. Biopsies were taken with a cold forceps for       histology. Estimated blood loss was minimal.      A medium-sized hiatal hernia was present.      Segmental severe mucosal changes characterized by congestion, erythema,       erosion and ulceration were found in the duodenal bulb. Biopsies were       taken with  a cold forceps for histology. Estimated blood loss was       minimal.      Normal mucosa was found in the second portion of the duodenum.      Red blood was found in the prepyloric region of the stomach.      Red blood was found in the duodenal bulb.      Diffuse atrophic mucosa was found in the gastric body. Impression:               -  Non-bleeding esophageal ulcer.                           - Non-bleeding esophageal ulcer.                           - Z-line, 35 cm from the incisors.                           - Oozing gastric ulcers.                           - Acute gastritis. Biopsied.                           - Medium-sized hiatal hernia.                           - Mucosal changes in the duodenum. Biopsied.                           - Normal mucosa was found in the second portion of                            the duodenum.                           - Red blood in the prepyloric region of the stomach.                           - Blood in the duodenal bulb.                           - Gastric mucosal atrophy. Recommendation:           - NPO.                           - Observe patient's clinical course.                           - Give Protonix (pantoprazole): initiate therapy                            with 80 mg IV bolus, then 8 mg/hr IV by continuous                            infusion.                           -  Await pathology results. Procedure Code(s):        --- Professional ---                           [phone], Esophagogastroduodenoscopy, flexible,                            transoral; with biopsy, single or multiple Diagnosis Code(s):        --- Professional ---                           D50.9, Iron deficiency anemia, unspecified                           R11.2, Nausea with vomiting, unspecified                           K22.10, Ulcer of esophagus without bleeding                           K25.4, Chronic or unspecified gastric ulcer with                            hemorrhage                           K92.2, Gastrointestinal hemorrhage, unspecified                           K29.00, Acute gastritis without bleeding                           K31.89, Other diseases of stomach and duodenum                           K44.9, Diaphragmatic hernia without obstruction or                            gangrene                            R62.7, Adult failure to thrive CPT copyright 2018 American Medical Association. All rights reserved. The codes documented in this report are preliminary and upon coder review may  be revised to meet current compliance requirements. [name], MD 08/26/2018 9:25:39 AM This report has been signed electronically. Number of Addenda: 0

## 2018-08-26 NOTE — Clinical Social Work Note (Signed)
Clinical Social Work Assessment  Patient Details  Name: Cathy Taylor MRN: [id number] Date of Birth: May 19, 1933  Date of referral:  08/26/18               Reason for consult:  Facility Placement                Permission sought to share information with:  Family Supports Permission granted to share information::  Yes, Release of Information Signed  Name::     Engineer, civil (consulting)::  [place]  Relationship::  Daughter  Contact Information:     Housing/Transportation Living arrangements for the past 2 months:  [place] of Information:  Adult Children Patient Interpreter Needed:  None Criminal Activity/Legal Involvement Pertinent to Current Situation/Hospitalization:  No - Comment as needed Significant Relationships:  Adult Children Lives with:  Self Do you feel safe going back to the place where you live?  Yes Need for family participation in patient care:  No (Coment)  Care giving concerns:  Pt is disoriented to situation. CSW spoke with pt's daughter at bedside in front of pt.   Social Worker assessment / plan:  CSW spoke with pt's daughter. Pt's daughter confirmed pt is from [place] and the plan would be for her to return at d/c. CSW to follow up with facility.  Employment status:  Retired Forensic scientist:  Medicare PT Recommendations:  [place] / Referral to community resources:  [place]  Patient/Family's Response to care:  Pt's daughter verbalized understanding of CSW role and expressed appreciation for support. Pt's daughter denies any concern regarding pt care at this time.   Patient/Family's Understanding of and Emotional Response to Diagnosis, Current Treatment, and Prognosis:  Pt's daughter understanding and realistic regarding pt's physical limitations. Pt's daughter understands the need for pt to d/c to SNF. Pt's daughter denies any concern regarding pt's treatment plan at this time.  CSW will continue to provide support and facilitate d/c needs.   Emotional Assessment Appearance:  Appears stated age Attitude/Demeanor/Rapport:  Unable to Assess Affect (typically observed):  Unable to Assess Orientation:  Oriented to Self, Oriented to Place, Oriented to  Time Alcohol / Substance use:  Not Applicable Psych involvement (Current and /or in the community):  No (Comment)  Discharge Needs  Concerns to be addressed:  Basic Needs, Care Coordination Readmission within the last 30 days:  No Current discharge risk:  Dependent with Mobility Barriers to Discharge:  Continued Medical Work up   [employer], LCSW 08/26/2018, 2:20 PM

## 2018-08-26 NOTE — Progress Notes (Addendum)
Initial Nutrition Assessment  DOCUMENTATION CODES:   Severe malnutrition in context of acute illness/injury  INTERVENTION:    Advance diet as medically appropriate, add interventions accordingly  NUTRITION DIAGNOSIS:   Severe Malnutrition related to acute illness(severe duodenitis) as evidenced by severe muscle depletion, mild to moderate fat depletion and 10% weight loss x 2 months  GOAL:   Patient will meet greater than or equal to 90% of their needs  MONITOR:   Diet advancement, PO intake, Labs, Weight trends, Skin, I & O's  REASON FOR ASSESSMENT:   Consult Assessment of nutrition requirement/status  ASSESSMENT:    84 yo Female with PMH of abdominal hernia, HTN, CKD, CVA, DM and recent UTI and ischemic versus infectious colitis; presents with persistent worsening N/V and weight loss.  RD briefly spoke with pt at bedside. Limited nutrition hx obtained. Pt is a poor historian. Pt did report a poor appetite and that she hasn't been eating well.  Per H&P pt has had nausea and vomiting x 1 month. Pt also did state she's lost weight but unable to identify quantity or time frame. Per readings below, pt has had a 10% weight loss x 2 months (severe).  S/p EGD >> severe duodenitis with esophageal ulcers. Labs & medications reviewed. K 3.1 (L). IV Protonix. CBG's [id number].  Pt meets criteria for severe malnutrition in acute illness/injury per Academy of Nutrition and Dietetics (AND) and American Society for Parenteral and Enteral Nutrition (ASPEN) guidelines.   NUTRITION - FOCUSED PHYSICAL EXAM:    Most Recent Value  Orbital Region  Mild depletion  Upper Arm Region  Moderate depletion  Thoracic and Lumbar Region  Moderate depletion  Buccal Region  Mild depletion  Temple Region  Mild depletion  Clavicle Bone Region  Moderate depletion  Clavicle and Acromion Bone Region  Moderate depletion  Scapular Bone Region  Moderate depletion  Dorsal Hand  Mild depletion   Patellar Region  Severe depletion  Anterior Thigh Region  Severe depletion  Posterior Calf Region  Severe depletion  Edema (RD Assessment)  None     Diet Order:   Diet Order            Diet NPO time specified Except for: [employer], Sips with Meds  Diet effective now             EDUCATION NEEDS:   Not appropriate for education at this time  Skin:  Skin Assessment: Skin Integrity Issues: Skin Integrity Issues:: Unstageable Unstageable: Right lateral foot   Last BM:  12/5   Wt Readings from Last 10 Encounters:  08/26/18 57.6 kg  07/05/18 63.6 kg  04/22/18 67.1 kg  04/21/18 66.3 kg  10/21/17 68.9 kg  04/20/17 65.3 kg  04/08/17 66.6 kg  10/16/16 60.9 kg  04/15/16 55.7 kg  04/07/16 55.5 kg   Height:   Ht Readings from Last 1 Encounters:  08/26/18 5\' 3"  (1.6 m)   Weight:   Wt Readings from Last 1 Encounters:  08/26/18 57.6 kg   BMI:  Body mass index is 22.5 kg/m.  Estimated Nutritional Needs:   Kcal:  1400-1600  Protein:  70-85 gm  Fluid:  >/= 1.5 L  Arthur Holms, RD, LDN Pager #: [phone] After-Hours Pager #: [phone]

## 2018-08-26 NOTE — Addendum Note (Signed)
Addendum  created 08/26/18 1011 by Kyung Rudd, CRNA   Intraprocedure Flowsheets edited

## 2018-08-26 NOTE — Consult Note (Signed)
[facility] Nurse wound consult note Reason for Consult:bilateral heels unstageable Wound type:pressure Pressure Injury POA: Yes (per pt) Measurement:Left heel 3cm x 6cm x unknown depth black calloused and eschar Right medial 1.5cm x 1.5cm x unknown depth black dried eschar  Wound bed:see above Drainage (amount, consistency, odor) none noted Periwound:intact Dressing procedure/placement/frequency: I have provided nurses with orders for Betadine BID to bilateral heel wounds, open to air in [employer] (patient is already wearing boots). Please use stabilizer to keep legs in correct anatomical position. We will not follow, but will remain available to this patient, to nursing, and the medical and/or surgical teams.  Please re-consult if we need to assist further.  Fara Olden, RN-C, WTA-C, [place] Wound Treatment Associate [facility] Associate

## 2018-08-26 NOTE — Brief Op Note (Signed)
Multiple gastric ulcers with oozing of blood seen that spontaneously stopped.. Large clean-based esophageal ulcers. Severe duodenitis. See endopro for details. Strict NPO. Protonix drip ordered. F/U on path.

## 2018-08-26 NOTE — Anesthesia Preprocedure Evaluation (Addendum)
Anesthesia Evaluation  Patient identified by MRN, date of birth, ID band Patient awake    Reviewed: Allergy & Precautions, NPO status , Patient's Chart, lab work & pertinent test results  Airway Mallampati: II  TM Distance: >3 FB Neck ROM: Full    Dental no notable dental hx. (+) Teeth Intact, Dental Advisory Given   Pulmonary neg pulmonary ROS,    Pulmonary exam normal breath sounds clear to auscultation       Cardiovascular hypertension, +CHF  Normal cardiovascular exam Rhythm:Regular Rate:Normal     Neuro/Psych CVA negative psych ROS   GI/Hepatic negative GI ROS, Neg liver ROS,   Endo/Other  diabetes  Renal/GU negative Renal ROS  negative genitourinary   Musculoskeletal negative musculoskeletal ROS (+)   Abdominal   Peds negative pediatric ROS (+)  Hematology  (+) anemia ,   Anesthesia Other Findings   Reproductive/Obstetrics negative OB ROS                            Anesthesia Physical Anesthesia Plan  ASA: III  Anesthesia Plan: MAC   Post-op Pain Management:    Induction: Intravenous  PONV Risk Score and Plan: 0  Airway Management Planned: Simple Face Mask  Additional Equipment:   Intra-op Plan:   Post-operative Plan:   Informed Consent: I have reviewed the patients History and Physical, chart, labs and discussed the procedure including the risks, benefits and alternatives for the proposed anesthesia with the patient or authorized representative who has indicated his/her understanding and acceptance.   Dental advisory given  Plan Discussed with: CRNA and Surgeon  Anesthesia Plan Comments:         Anesthesia Quick Evaluation

## 2018-08-26 NOTE — Anesthesia Procedure Notes (Signed)
Procedure Name: Intubation Date/Time: 08/26/2018 8:52 AM Performed by: Kyung Rudd, CRNA Pre-anesthesia Checklist: Patient identified, Emergency Drugs available, Suction available and Patient being monitored Patient Re-evaluated:Patient Re-evaluated prior to induction Oxygen Delivery Method: Circle system utilized Preoxygenation: Pre-oxygenation with 100% oxygen Induction Type: IV induction, Rapid sequence and Cricoid Pressure applied Laryngoscope Size: Mac and 3 Grade View: Grade I Tube type: Oral Tube size: 7.0 mm Number of attempts: 1 Airway Equipment and Method: Stylet Placement Confirmation: ETT inserted through vocal cords under direct vision,  positive ETCO2 and breath sounds checked- equal and bilateral Tube secured with: Tape Dental Injury: Teeth and Oropharynx as per pre-operative assessment

## 2018-08-27 ENCOUNTER — Inpatient Hospital Stay (HOSPITAL_COMMUNITY): Payer: Medicare Other

## 2018-08-27 LAB — COMPREHENSIVE METABOLIC PANEL
ALT: 9 U/L (ref 0–44)
ANION GAP: 7 (ref 5–15)
AST: 21 U/L (ref 15–41)
Albumin: 1.7 g/dL — ABNORMAL LOW (ref 3.5–5.0)
Alkaline Phosphatase: 36 U/L — ABNORMAL LOW (ref 38–126)
BILIRUBIN TOTAL: 0.6 mg/dL (ref 0.3–1.2)
BUN: 6 mg/dL — ABNORMAL LOW (ref 8–23)
CO2: 24 mmol/L (ref 22–32)
Calcium: 8.2 mg/dL — ABNORMAL LOW (ref 8.9–10.3)
Chloride: 111 mmol/L (ref 98–111)
Creatinine, Ser: 0.98 mg/dL (ref 0.44–1.00)
GFR calc Af Amer: >60 mL/min (ref >60)
GFR calc non Af Amer: 53 mL/min — ABNORMAL LOW (ref >60)
Glucose, Bld: 107 mg/dL — ABNORMAL HIGH (ref 70–99)
Potassium: 3.1 mmol/L — ABNORMAL LOW (ref 3.5–5.1)
Sodium: 142 mmol/L (ref 135–145)
TOTAL PROTEIN: 4.9 g/dL — AB (ref 6.5–8.1)

## 2018-08-27 LAB — GLUCOSE, CAPILLARY
Glucose-Capillary: 91 mg/dL (ref 70–99)
Glucose-Capillary: 92 mg/dL (ref 70–99)
Glucose-Capillary: 107 mg/dL — ABNORMAL HIGH (ref 70–99)
Glucose-Capillary: 113 mg/dL — ABNORMAL HIGH (ref 70–99)
Glucose-Capillary: 98 mg/dL (ref 70–99)
Glucose-Capillary: 107 mg/dL — ABNORMAL HIGH (ref 70–99)

## 2018-08-27 LAB — CBC WITH DIFFERENTIAL/PLATELET
Band Neutrophils: 0 %
Basophils Absolute: 0 10*3/uL (ref 0.0–0.1)
Basophils Relative: 0 %
Blasts: 0 %
Eosinophils Absolute: 0.1 10*3/uL (ref 0.0–0.5)
Eosinophils Relative: 2 %
HCT: 25.9 % — ABNORMAL LOW (ref 36.0–46.0)
Hemoglobin: 8 g/dL — ABNORMAL LOW (ref 12.0–15.0)
LYMPHS PCT: 42 %
Lymphs Abs: 3 10*3/uL (ref 0.7–4.0)
MCH: 24.6 pg — AB (ref 26.0–34.0)
MCHC: 30.9 g/dL (ref 30.0–36.0)
MCV: 79.7 fL — ABNORMAL LOW (ref 80.0–100.0)
Metamyelocytes Relative: 0 %
Monocytes Absolute: 0.8 10*3/uL (ref 0.1–1.0)
Monocytes Relative: 11 %
Myelocytes: 0 %
NEUTROS ABS: 3.2 10*3/uL (ref 1.7–7.7)
NRBC: 0 /100{WBCs}
Neutrophils Relative %: 45 %
OTHER: 0 %
Platelets: 334 10*3/uL (ref 150–400)
Promyelocytes Relative: 0 %
RBC: 3.25 MIL/uL — ABNORMAL LOW (ref 3.87–5.11)
RDW: 22.3 % — ABNORMAL HIGH (ref 11.5–15.5)
WBC: 7.1 10*3/uL (ref 4.0–10.5)
nRBC: 1.7 % — ABNORMAL HIGH (ref 0.0–0.2)

## 2018-08-27 LAB — MAGNESIUM: Magnesium: 1.6 mg/dL — ABNORMAL LOW (ref 1.7–2.4)

## 2018-08-27 LAB — MRSA PCR SCREENING: MRSA by PCR: NEGATIVE

## 2018-08-27 MED ORDER — POTASSIUM CHLORIDE CRYS ER 20 MEQ PO TBCR
40.0000 meq | EXTENDED_RELEASE_TABLET | Freq: Once | ORAL | Status: AC
  Administered 2018-08-27: 40 meq via ORAL
  Filled 2018-08-27: qty 2

## 2018-08-27 MED ORDER — MAGNESIUM SULFATE 2 GM/50ML IV SOLN
2.0000 g | Freq: Once | INTRAVENOUS | Status: AC
  Administered 2018-08-27: 2 g via INTRAVENOUS
  Filled 2018-08-27: qty 50

## 2018-08-27 NOTE — Progress Notes (Signed)
Physical Therapy Treatment Patient Details Name: Cathy Taylor MRN: [id number] DOB: April 02, 1933 Today's Date: 08/27/2018    History of Present Illness Pt is an 84 y/o female admitted secondary to failure to thrive and emesis. CT of abdomen revealed small layering dependent gallstone within gallbladder. Per notes, pt likely for EGD on 12/5. PMH includes dementia, HTN, DM, CVA, L TSA, CKD, and R breast cancer.     PT Comments    Pt making slow progress. Recommend return to SNF at dc.   Follow Up Recommendations  SNF;Supervision/Assistance - 24 hour     Equipment Recommendations  None recommended by PT    Recommendations for Other Services       Precautions / Restrictions Precautions Precautions: Fall Restrictions Weight Bearing Restrictions: No    Mobility  Bed Mobility Overal bed mobility: Needs Assistance Bed Mobility: Supine to Sit;Sit to Supine     Supine to sit: Mod assist Sit to supine: Max assist   General bed mobility comments: Assist to bring legs off of bed, elevate trunk into sitting, and bring hips to EOB. Assist to lower trunk into supine and bring legs back up into bed.   Transfers Overall transfer level: Needs assistance Equipment used: Ambulation equipment used Transfers: Sit to/from Stand Sit to Stand: Max assist;+2 physical assistance         General transfer comment: Assist to bring hips up. Used stedy to move from bed to bsc to bed.   Ambulation/Gait                 Stairs             Wheelchair Mobility    Modified Rankin (Stroke Patients Only)       Balance Overall balance assessment: Needs assistance Sitting-balance support: Feet supported;Bilateral upper extremity supported Sitting balance-Leahy Scale: Poor Sitting balance - Comments: UE assist and close supervision   Standing balance support: Bilateral upper extremity supported Standing balance-Leahy Scale: Zero Standing balance comment: mod assist and stedy for  static standing. Stood x 30 sec for [employer] Arousal/Alertness: Awake/alert Behavior During Therapy: Flat affect Overall Cognitive Status: History of cognitive impairments - at baseline                                        Exercises      General Comments        Pertinent Vitals/Pain Pain Assessment: No/denies pain    Home Living                      Prior Function            PT Goals (current goals can now be found in the care plan section) Progress towards PT goals: Progressing toward goals    Frequency    Min 2X/week      PT Plan Current plan remains appropriate    Co-evaluation              AM-PAC PT "6 Clicks" Mobility   Outcome Measure  Help needed turning from your back to your side while in a flat bed without using bedrails?: A Lot Help needed moving from lying on your back to sitting on the side of a flat bed  without using bedrails?: A Lot Help needed moving to and from a bed to a chair (including a wheelchair)?: Total Help needed standing up from a chair using your arms (e.g., wheelchair or bedside chair)?: Total Help needed to walk in hospital room?: Total Help needed climbing 3-5 steps with a railing? : Total 6 Click Score: 8    End of Session Equipment Utilized During Treatment: Gait belt Activity Tolerance: Patient tolerated treatment well Patient left: in bed;with call bell/phone within reach;with bed alarm set;with family/visitor present Nurse Communication: Mobility status PT Visit Diagnosis: Unsteadiness on feet (R26.81);Muscle weakness (generalized) (M62.81);Difficulty in walking, not elsewhere classified (R26.2)     Time: 0315-9458 PT Time Calculation (min) (ACUTE ONLY): 49 min  Charges:  $Therapeutic Activity: 38-52 mins                     [place] Pager [phone] Office [place] 08/27/2018, 5:07 PM

## 2018-08-27 NOTE — Progress Notes (Signed)
[facility] Progress Note  Patient: Cathy Taylor TKZ:[id number]   PCP: Maryella Shivers, MD DOB: Oct 25, 1932   DOA: 08/24/2018   DOS: 08/27/2018   Date of Service: the patient was seen and examined on 08/27/2018  Brief hospital course: Pt. with PMH of abdominal hernia, anxiety, depression, GERD, hypertension, hyperlipidemia, CHF, CKD 3, CVA, type 2 diabetes ; admitted on 08/24/2018, presented with complaint of nausea vomiting and weight loss, was found to have gastritis and duodenitis. Currently further plan is further supportive measures.  Subjective: No further vomiting but reports short of breath.  No nausea no abdominal pain.  No diarrhea reported.  Assessment and Plan: Failure to thrive, nausea, vomiting Acute gastritis and duodenitis. Nonbleeding gastric ulcers as well.  Patient is presenting with progressively worse nausea and vomiting related to eating for the past few months.  She has not been able to tolerate p.o. intake and has been losing weight.  During admission in October, CT showed bowel wall thickening in the terminal ileum suggesting enteritis (infectious versus inflammatory).  Also showed focal thickening of the cecal wall suspicious for a cecal mass. Repeat CT on admission showing small layering stone within the gallbladder fundus, no focal hepatic abnormality or biliary ductal dilation.  Also showing mildly prominent pancreatic duct with a focal dilated area in the pancreatic body measuring up to 6 mm, pancreatic atrophy, no focal abnormality.  White count mildly elevated at 13.0.  Patient is afebrile.  Bicarb is 20, anion gap 16 on admission likely due to lactic acidosis.  Lactate 4.37 >> 1.1 with fluid resuscitation. Procalcitonin <0.10.  Mild leukocytosis likely reactive. Albumin 2.5.  T bili 1.3, remainder of LFTs normal.  Lipase normal.  FOBT negative. Continue IV fluids. Underwent EGD which showed severe gastritis and duodenitis with nonbleeding ulcers. On Protonix  drip. Monitor H&H daily.  Mild leukocytosis Likely reactive.  Patient is afebrile.  Lactic acidosis resolved after IV fluid resuscitation.  Procalcitonin <0.10.  Patient noted to have mild bibasilar rales on exam.  However, chest x-ray showing no active cardiopulmonary disease. -UA pending  -Repeat CBC in a.m.  Physical deconditioning -PT evaluation  Chronic anemia -Hemoglobin 11.9, stable.  Hypoglycemia, history of type 2 diabetes Patient has a history of type 2 diabetes and is currently not on any home medications.  CBG as low as 57 in the setting of decreased p.o. Intake.  Received an amp of D50. -D5-LR infusion  -Regular diet -Continue to monitor CBGs  CKD 3 Creatinine 1.4, was 1.2 a month ago. Mild elevation in creatinine likely due to dehydration from decreased p.o. Intake. -IV fluid hydration -Repeat BMP in a.m. -Hold home lisinopril  Bilateral heel pressure sores -Wound care  Anxiety, depression Continue home mirtazapine, Lexapro  History of CVA -Continue home aspirin  Dementia -Continue home Aricept  HTN -Hold home lisinopril  -Hydralazine prn   Hyperlipidemia -Continue home fenofibrate  Malnutrition Type: Nutrition Problem: Severe Malnutrition Etiology: acute illness(severe duodenitis)  Diet: clear liquid DVT Prophylaxis: mechanical compression device  Advance goals of care discussion: DNR DNI  Family Communication: family was present at bedside, at the time of interview. The pt provided permission to discuss medical plan with the family. Opportunity was given to ask question and all questions were answered satisfactorily.   Disposition:  Discharge to home.  Consultants: gastroenterology  Procedures: EGD  Scheduled Meds: . collagenase   Topical Daily  . donepezil  10 mg Oral QHS  . escitalopram  10 mg Oral Daily  . gabapentin  100 mg Oral TID  . mirtazapine  3.75 mg Oral QHS  . [START ON 08/29/2018] pantoprazole  40 mg  Intravenous Q12H  . povidone-iodine   Topical BID   Continuous Infusions: . dextrose 5 % and 0.45 % NaCl with KCl 20 mEq/L 75 mL/hr at 08/27/18 1520  . pantoprozole (PROTONIX) infusion 8 mg/hr (08/27/18 1210)   PRN Meds: acetaminophen **OR** acetaminophen, diphenhydrAMINE, hydrALAZINE, ondansetron (ZOFRAN) IV Antibiotics: Anti-infectives (From admission, onward)   None       Objective: Physical Exam: Vitals:   08/26/18 1813 08/26/18 2249 08/27/18 0835 08/27/18 1736  BP: (!) 110/54 101/63 (!) 112/55 (!) 127/58  Pulse: 74 66 63 71  Resp:  16 11 16   Temp: (!) 97.2 F (36.2 C) 98 F (36.7 C) (!) 97.5 F (36.4 C) 97.9 F (36.6 C)  TempSrc:  Oral Oral   SpO2: 100% (!) 86% 100% 100%  Weight:      Height:        Intake/Output Summary (Last 24 hours) at 08/27/2018 1901 Last data filed at 08/26/2018 2300 Gross per 24 hour  Intake 966.64 ml  Output -  Net 966.64 ml   Filed Weights   08/26/18 0727  Weight: 57.6 kg   General: Alert, Awake and Oriented to Time, Place and Person. Appear in mild distress, affect appropriate Eyes: PERRL, Conjunctiva normal ENT: Oral Mucosa clear moist. Neck: no JVD, no Abnormal Mass Or lumps Cardiovascular: S1 and S2 Present, no Murmur, Peripheral Pulses Present Respiratory: normal respiratory effort, Bilateral Air entry equal and Decreased, no use of accessory muscle, Clear to Auscultation, no Crackles, no wheezes Abdomen: Bowel Sound present, Soft and no tenderness, no hernia Skin: no redness, no Rash, no induration Extremities: no Pedal edema, no calf tenderness Neurologic: Grossly no focal neuro deficit. Bilaterally Equal motor strength  Data Reviewed: CBC: Recent Labs  Lab 08/24/18 1630 08/25/18 1238 08/25/18 1428 08/26/18 0352 08/26/18 1448 08/27/18 0311  WBC 13.0* 8.7 9.9 8.6 8.2 7.1  NEUTROABS 7.0  --   --   --   --  3.2  HGB 11.1* 7.9* 8.8* 8.8* 8.5* 8.0*  HCT 36.6 24.8* 26.9* 26.4* 26.5* 25.9*  MCV 81.3 80.3 78.7* 77.4*  78.4* 79.7*  PLT 407* 345 363 379 291 071   Basic Metabolic Panel: Recent Labs  Lab 08/24/18 1630 08/25/18 1238 08/25/18 1428 08/25/18 1939 08/26/18 0352 08/27/18 0311  NA 139 139 142  --  142 142  K 3.8 3.2* 3.0* 3.2* 3.1* 3.1*  CL 103 108 110  --  110 111  CO2 20* 21* 24  --  22 24  GLUCOSE 102* 701* 147*  --  100* 107*  BUN 13 7* 9  --  8 6*  CREATININE 1.40* 1.05* 1.08*  --  0.94 0.98  CALCIUM 9.3 8.3* 8.7*  --  8.6* 8.2*  MG  --   --   --   --   --  1.6*    Liver Function Tests: Recent Labs  Lab 08/24/18 1630 08/27/18 0311  AST 24 21  ALT 10 9  ALKPHOS 50 36*  BILITOT 1.3* 0.6  PROT 7.5 4.9*  ALBUMIN 2.5* 1.7*   Recent Labs  Lab 08/24/18 1630  LIPASE 48   No results for input(s): AMMONIA in the last 168 hours. Coagulation Profile: Recent Labs  Lab 08/24/18 1805  INR 1.22   Cardiac Enzymes: No results for input(s): CKTOTAL, CKMB, CKMBINDEX, TROPONINI in the last 168 hours. BNP (last 3 results) No  results for input(s): PROBNP in the last 8760 hours. CBG: Recent Labs  Lab 08/27/18 0336 08/27/18 0657 08/27/18 0835 08/27/18 1023 08/27/18 1226  GLUCAP 92 107* 113* 98 107*   Studies: Dg Chest Port 1 View  Result Date: 08/27/2018 CLINICAL DATA:  Shortness of Breath EXAM: PORTABLE CHEST 1 VIEW COMPARISON:  08/24/2018 FINDINGS: Cardiac shadow is within normal limits. The lungs are well aerated bilaterally without focal infiltrate. Loop recorder is again noted and stable. Aortic calcifications are seen. No acute bony abnormality is noted. Postsurgical changes in left shoulder are seen. IMPRESSION: No acute abnormality noted. Electronically Signed   By: Inez Catalina M.D.   On: 08/27/2018 13:01   Time spent: 35 minutes  Author: Berle Mull, MD [facility] Pager: [phone] 08/27/2018 7:01 PM  Between 7PM-7AM, please contact night-coverage at [email or web address], password [facility]

## 2018-08-27 NOTE — Progress Notes (Signed)
[facility] Progress Note  Cathy Taylor 84 y.o. 1932/12/16   Subjective: Resting in bed. Denies abdominal pain. Nurse tech in room during my evaluation.  Objective: Vital signs: Vitals:   08/26/18 2249 08/27/18 0835  BP: 101/63 (!) 112/55  Pulse: 66 63  Resp: 16 11  Temp: 98 F (36.7 C) (!) 97.5 F (36.4 C)  SpO2: (!) 86% 100%    Physical Exam: Gen: lethargic, elderly, frail, no acute distress CV: RRR Chest: CTA B Abd: diffuse tenderness with guarding, soft, nondistended, +BS Ext: no edema  Lab Results: Recent Labs    08/26/18 0352 08/27/18 0311  NA 142 142  K 3.1* 3.1*  CL 110 111  CO2 22 24  GLUCOSE 100* 107*  BUN 8 6*  CREATININE 0.94 0.98  CALCIUM 8.6* 8.2*  MG  --  1.6*   Recent Labs    08/24/18 1630 08/27/18 0311  AST 24 21  ALT 10 9  ALKPHOS 50 36*  BILITOT 1.3* 0.6  PROT 7.5 4.9*  ALBUMIN 2.5* 1.7*   Recent Labs    08/24/18 1630  08/26/18 1448 08/27/18 0311  WBC 13.0*   < > 8.2 7.1  NEUTROABS 7.0  --   --  3.2  HGB 11.1*   < > 8.5* 8.0*  HCT 36.6   < > 26.5* 25.9*  MCV 81.3   < > 78.4* 79.7*  PLT 407*   < > 291 334   < > = values in this interval not displayed.      Assessment/Plan: Recurrent vomiting likely due to gastric and esophageal ulcers. Hgb 8.0. Continue Protonix drip. NPO but if stable then trial of clears tomorrow. [facility] to follow up tomorrow.   Cathy Taylor 08/27/2018, 8:45 AM  Questions please call [phone] ID: [name], female   DOB: 1933/02/14, 84 y.o.   MRN: [id number]

## 2018-08-27 NOTE — Care Management Important Message (Signed)
Important Message  Patient Details  Name: Cathy Taylor MRN: [id number] Date of Birth: Sep 19, 1933   Medicare Important Message Given:  Yes    Chistine Dematteo 08/27/2018, 4:07 PM

## 2018-08-28 DIAGNOSIS — L8990 Pressure ulcer of unspecified site, unspecified stage: Secondary | ICD-10-CM

## 2018-08-28 LAB — BASIC METABOLIC PANEL
Anion gap: 8 (ref 5–15)
BUN: <5 mg/dL — ABNORMAL LOW (ref 8–23)
CO2: 19 mmol/L — ABNORMAL LOW (ref 22–32)
Calcium: 8.6 mg/dL — ABNORMAL LOW (ref 8.9–10.3)
Chloride: 111 mmol/L (ref 98–111)
Creatinine, Ser: 0.82 mg/dL (ref 0.44–1.00)
GFR calc Af Amer: >60 mL/min (ref >60)
Glucose, Bld: 131 mg/dL — ABNORMAL HIGH (ref 70–99)
POTASSIUM: 4.5 mmol/L (ref 3.5–5.1)
Sodium: 138 mmol/L (ref 135–145)

## 2018-08-28 LAB — CBC
HCT: 29.9 % — ABNORMAL LOW (ref 36.0–46.0)
Hemoglobin: 9.4 g/dL — ABNORMAL LOW (ref 12.0–15.0)
MCH: 25.5 pg — ABNORMAL LOW (ref 26.0–34.0)
MCHC: 31.4 g/dL (ref 30.0–36.0)
MCV: 81.3 fL (ref 80.0–100.0)
Platelets: 344 10*3/uL (ref 150–400)
RBC: 3.68 MIL/uL — AB (ref 3.87–5.11)
RDW: 23.2 % — ABNORMAL HIGH (ref 11.5–15.5)
WBC: 7.7 10*3/uL (ref 4.0–10.5)
nRBC: 1.3 % — ABNORMAL HIGH (ref 0.0–0.2)

## 2018-08-28 NOTE — Progress Notes (Signed)
[facility] PROGRESS NOTE Subjective Patient feeling better no further gross bleeding  Objective: Vital signs in last 24 hours: Temp:  [97.5 F (36.4 C)-97.9 F (36.6 C)] 97.9 F (36.6 C) (12/07 0828) Pulse Rate:  [64-71] 65 (12/07 0828) Resp:  [11-16] 11 (12/07 0828) BP: (127-135)/(55-70) 135/55 (12/07 0828) SpO2:  [99 %-100 %] 99 % (12/07 0828) Last BM Date: 08/26/18  Intake/Output from previous day: 12/06 0701 - 12/07 0700 In: 2448.5 [I.V.:2448.5] Out: 1 [Urine:1] Intake/Output this shift: No intake/output data recorded.  PE: General--frail little elderly woman  Abdomen--nontender  Lab Results: Recent Labs    08/25/18 1428 08/26/18 0352 08/26/18 1448 08/27/18 0311 08/28/18 0838  WBC 9.9 8.6 8.2 7.1 7.7  HGB 8.8* 8.8* 8.5* 8.0* 9.4*  HCT 26.9* 26.4* 26.5* 25.9* 29.9*  PLT 363 379 291 334 344   BMET Recent Labs    08/25/18 1238 08/25/18 1428 08/25/18 1939 08/26/18 0352 08/27/18 0311 08/28/18 0838  NA 139 142  --  142 142 138  K 3.2* 3.0* 3.2* 3.1* 3.1* 4.5  CL 108 110  --  110 111 111  CO2 21* 24  --  22 24 19*  CREATININE 1.05* 1.08*  --  0.94 0.98 0.82   LFT Recent Labs    08/27/18 0311  PROT 4.9*  AST 21  ALT 9  ALKPHOS 36*  BILITOT 0.6   PT/INR No results for input(s): LABPROT, INR in the last 72 hours. PANCREAS No results for input(s): LIPASE in the last 72 hours.       Studies/Results: Dg Chest Port 1 View  Result Date: 08/27/2018 CLINICAL DATA:  Shortness of Breath EXAM: PORTABLE CHEST 1 VIEW COMPARISON:  08/24/2018 FINDINGS: Cardiac shadow is within normal limits. The lungs are well aerated bilaterally without focal infiltrate. Loop recorder is again noted and stable. Aortic calcifications are seen. No acute bony abnormality is noted. Postsurgical changes in left shoulder are seen. IMPRESSION: No acute abnormality noted. Electronically Signed   By: Inez Catalina M.D.   On: 08/27/2018 13:01    Medications: I have  reviewed the patient's current medications.  Assessment:   1.  Multiple gastric and esophageal ulcers seems to be doing reasonably well   Plan: I would go ahead and give her a trial of full liquids.  If she does well with that over the next day could advance to mechanically soft diet.  We will check her on Monday.   Nancy Fetter 08/28/2018, 11:12 AM  This note was created using voice recognition software. Minor errors may Have occurred unintentionally.  Pager: [phone] If no answer or after hours call [phone]

## 2018-08-28 NOTE — Progress Notes (Signed)
[facility] Progress Note  Patient: Cathy Taylor WGN:[id number]   PCP: Maryella Shivers, MD DOB: [birth year]   DOA: 08/24/2018   DOS: 08/28/2018   Date of Service: the patient was seen and examined on 08/28/2018  Brief hospital course: Pt. with PMH of abdominal hernia, anxiety, depression, GERD, hypertension, hyperlipidemia, CHF, CKD 3, CVA, type 2 diabetes ; admitted on 08/24/2018, presented with complaint of nausea vomiting and weight loss, was found to have gastritis and duodenitis. Currently further plan is further supportive measures.  Subjective: no nausea or vomiting no fever or chills.   Assessment and Plan: Failure to thrive, nausea, vomiting Acute gastritis and duodenitis. Nonbleeding gastric ulcers as well.  Patient is presenting with progressively worse nausea and vomiting related to eating for the past few months.  She has not been able to tolerate p.o. intake and has been losing weight.  During admission in October, CT showed bowel wall thickening in the terminal ileum suggesting enteritis (infectious versus inflammatory).  Also showed focal thickening of the cecal wall suspicious for a cecal mass. Repeat CT on admission showing small layering stone within the gallbladder fundus, no focal hepatic abnormality or biliary ductal dilation.  Also showing mildly prominent pancreatic duct with a focal dilated area in the pancreatic body measuring up to 6 mm, pancreatic atrophy, no focal abnormality.  White count mildly elevated at 13.0.  Patient is afebrile.  Bicarb is 20, anion gap 16 on admission likely due to lactic acidosis.  Lactate 4.37 >> 1.1 with fluid resuscitation. Procalcitonin <0.10.  Mild leukocytosis likely reactive. Albumin 2.5.  T bili 1.3, remainder of LFTs normal.  Lipase normal.  FOBT negative. Continue IV fluids. Underwent EGD which showed severe gastritis and duodenitis with nonbleeding ulcers. On Protonix drip. Change to q12. Also advance diet to full liquid Monitor  H&H daily.  Mild leukocytosis Likely reactive.  Patient is afebrile.  Lactic acidosis resolved after IV fluid resuscitation.  Procalcitonin <0.10.  Patient noted to have mild bibasilar rales on exam.  However, chest x-ray showing no active cardiopulmonary disease.  Physical deconditioning -PT evaluation  Chronic anemia -Hemoglobin 11.9, stable.  Hypoglycemia, history of type 2 diabetes Patient has a history of type 2 diabetes and is currently not on any home medications.  CBG as low as 57 in the setting of decreased p.o. Intake.  Received an amp of D50. -D5-LR infusion  -Regular diet -Continue to monitor CBGs  CKD 3 Creatinine 1.4, was 1.2 a month ago. Mild elevation in creatinine likely due to dehydration from decreased p.o. Intake. -IV fluid hydration -Repeat BMP in a.m. -Hold home lisinopril  Bilateral heel pressure sores -Wound care  Anxiety, depression Continue home mirtazapine, Lexapro  History of CVA -Continue home aspirin  Dementia -Continue home Aricept  HTN -Hold home lisinopril  -Hydralazine prn   Hyperlipidemia -Continue home fenofibrate  Malnutrition Type: Nutrition Problem: Severe Malnutrition Etiology: acute illness(severe duodenitis)  Diet: clear liquid DVT Prophylaxis: mechanical compression device  Advance goals of care discussion: DNR DNI  Family Communication: family was present at bedside, at the time of interview. The pt provided permission to discuss medical plan with the family. Opportunity was given to ask question and all questions were answered satisfactorily.   Disposition:  Discharge to home.  Consultants: gastroenterology  Procedures: EGD  Scheduled Meds: . collagenase   Topical Daily  . donepezil  10 mg Oral QHS  . escitalopram  10 mg Oral Daily  . gabapentin  100 mg Oral TID  . mirtazapine  3.75 mg Oral QHS  . [START ON 08/29/2018] pantoprazole  40 mg Intravenous Q12H  . povidone-iodine   Topical BID    Continuous Infusions: . dextrose 5 % and 0.45 % NaCl with KCl 20 mEq/L 75 mL/hr at 08/28/18 0443  . pantoprozole (PROTONIX) infusion 8 mg/hr (08/28/18 0805)   PRN Meds: acetaminophen **OR** acetaminophen, diphenhydrAMINE, hydrALAZINE, ondansetron (ZOFRAN) IV Antibiotics: Anti-infectives (From admission, onward)   None       Objective: Physical Exam: Vitals:   08/27/18 1736 08/27/18 2322 08/28/18 0828 08/28/18 1708  BP: (!) 127/58 135/70 (!) 135/55 (!) 141/70  Pulse: 71 64 65 72  Resp: 16 16 11 11   Temp: 97.9 F (36.6 C) (!) 97.5 F (36.4 C) 97.9 F (36.6 C) 97.9 F (36.6 C)  TempSrc:  Oral    SpO2: 100% 100% 99%   Weight:      Height:        Intake/Output Summary (Last 24 hours) at 08/28/2018 1728 Last data filed at 08/28/2018 0443 Gross per 24 hour  Intake 2448.5 ml  Output 1 ml  Net 2447.5 ml   Filed Weights   08/26/18 0727  Weight: 57.6 kg   General: Alert, Awake and Oriented to Time, Place and Person. Appear in mild distress, affect appropriate Eyes: PERRL, Conjunctiva normal ENT: Oral Mucosa clear moist. Neck: no JVD, no Abnormal Mass Or lumps Cardiovascular: S1 and S2 Present, no Murmur, Peripheral Pulses Present Respiratory: normal respiratory effort, Bilateral Air entry equal and Decreased, no use of accessory muscle, Clear to Auscultation, no Crackles, no wheezes Abdomen: Bowel Sound present, Soft and no tenderness, no hernia Skin: no redness, no Rash, no induration Extremities: no Pedal edema, no calf tenderness Neurologic: Grossly no focal neuro deficit. Bilaterally Equal motor strength  Data Reviewed: CBC: Recent Labs  Lab 08/24/18 1630  08/25/18 1428 08/26/18 0352 08/26/18 1448 08/27/18 0311 08/28/18 0838  WBC 13.0*   < > 9.9 8.6 8.2 7.1 7.7  NEUTROABS 7.0  --   --   --   --  3.2  --   HGB 11.1*   < > 8.8* 8.8* 8.5* 8.0* 9.4*  HCT 36.6   < > 26.9* 26.4* 26.5* 25.9* 29.9*  MCV 81.3   < > 78.7* 77.4* 78.4* 79.7* 81.3  PLT 407*   < > 363  379 291 334 344   < > = values in this interval not displayed.   Basic Metabolic Panel: Recent Labs  Lab 08/25/18 1238 08/25/18 1428 08/25/18 1939 08/26/18 0352 08/27/18 0311 08/28/18 0838  NA 139 142  --  142 142 138  K 3.2* 3.0* 3.2* 3.1* 3.1* 4.5  CL 108 110  --  110 111 111  CO2 21* 24  --  22 24 19*  GLUCOSE 701* 147*  --  100* 107* 131*  BUN 7* 9  --  8 6* <5*  CREATININE 1.05* 1.08*  --  0.94 0.98 0.82  CALCIUM 8.3* 8.7*  --  8.6* 8.2* 8.6*  MG  --   --   --   --  1.6*  --     Liver Function Tests: Recent Labs  Lab 08/24/18 1630 08/27/18 0311  AST 24 21  ALT 10 9  ALKPHOS 50 36*  BILITOT 1.3* 0.6  PROT 7.5 4.9*  ALBUMIN 2.5* 1.7*   Recent Labs  Lab 08/24/18 1630  LIPASE 48   No results for input(s): AMMONIA in the last 168 hours. Coagulation Profile: Recent Labs  Lab 08/24/18  1805  INR 1.22   Cardiac Enzymes: No results for input(s): CKTOTAL, CKMB, CKMBINDEX, TROPONINI in the last 168 hours. BNP (last 3 results) No results for input(s): PROBNP in the last 8760 hours. CBG: Recent Labs  Lab 08/27/18 0336 08/27/18 0657 08/27/18 0835 08/27/18 1023 08/27/18 1226  GLUCAP 92 107* 113* 98 107*   Studies: No results found. Time spent: 35 minutes  Author: Berle Mull, MD [facility] Pager: [phone] 08/28/2018 5:28 PM  Between 7PM-7AM, please contact night-coverage at [email or web address], password [facility]

## 2018-08-29 MED ORDER — METOCLOPRAMIDE HCL 5 MG/ML IJ SOLN
5.0000 mg | Freq: Three times a day (TID) | INTRAMUSCULAR | Status: DC
  Administered 2018-08-29 – 2018-09-01 (×8): 5 mg via INTRAVENOUS
  Filled 2018-08-29 (×8): qty 2

## 2018-08-29 MED ORDER — SODIUM CHLORIDE 0.9 % IV SOLN
8.0000 mg/h | Freq: Once | INTRAVENOUS | Status: AC
  Administered 2018-08-29: 8 mg/h via INTRAVENOUS
  Filled 2018-08-29: qty 80

## 2018-08-29 NOTE — Progress Notes (Signed)
[facility] Progress Note  Patient: Cathy Taylor QPY:[id number]   PCP: Maryella Shivers, MD DOB: 1933/01/29   DOA: 08/24/2018   DOS: 08/29/2018   Date of Service: the patient was seen and examined on 08/29/2018  Brief hospital course: Pt. with PMH of abdominal hernia, anxiety, depression, GERD, hypertension, hyperlipidemia, CHF, CKD 3, CVA, type 2 diabetes ; admitted on 08/24/2018, presented with complaint of nausea vomiting and weight loss, was found to have gastritis and duodenitis. Currently further plan is further supportive measures.  Subjective: She reports one episode of vomiting.  No fever no chills.  Assessment and Plan: Failure to thrive, nausea, vomiting Acute gastritis and duodenitis. Nonbleeding gastric ulcers as well.  Patient is presenting with progressively worse nausea and vomiting related to eating for the past few months.  She has not been able to tolerate p.o. intake and has been losing weight.  During admission in October, CT showed bowel wall thickening in the terminal ileum suggesting enteritis (infectious versus inflammatory).  Also showed focal thickening of the cecal wall suspicious for a cecal mass. Repeat CT on admission showing small layering stone within the gallbladder fundus, no focal hepatic abnormality or biliary ductal dilation.  Also showing mildly prominent pancreatic duct with a focal dilated area in the pancreatic body measuring up to 6 mm, pancreatic atrophy, no focal abnormality.  White count mildly elevated at 13.0.  Patient is afebrile.  Bicarb is 20, anion gap 16 on admission likely due to lactic acidosis.  Lactate 4.37 >> 1.1 with fluid resuscitation. Procalcitonin <0.10.  Mild leukocytosis likely reactive. Albumin 2.5.  T bili 1.3, remainder of LFTs normal.  Lipase normal.  FOBT negative. Continue IV fluids. Underwent EGD which showed severe gastritis and duodenitis with nonbleeding ulcers. On Protonix drip. Change to q12. Also advance diet to full  liquid Monitor H&H daily. Another episode of vomiting after initiation of oral diet. Will downgrade to clear liquid diet, add regular scheduled Reglan IV.  Monitor.  Mild leukocytosis Likely reactive.  Patient is afebrile.  Lactic acidosis resolved after IV fluid resuscitation.  Procalcitonin <0.10.  Patient noted to have mild bibasilar rales on exam.  However, chest x-ray showing no active cardiopulmonary disease.  Physical deconditioning -PT evaluation  Chronic anemia -Hemoglobin 11.9, stable.  Hypoglycemia, history of type 2 diabetes Patient has a history of type 2 diabetes and is currently not on any home medications.  CBG as low as 57 in the setting of decreased p.o. Intake.  Received an amp of D50. -D5-LR infusion  -Regular diet -Continue to monitor CBGs  CKD 3 Creatinine 1.4, was 1.2 a month ago. Mild elevation in creatinine likely due to dehydration from decreased p.o. Intake. -IV fluid hydration -Repeat BMP in a.m. -Hold home lisinopril  Bilateral heel pressure sores -Wound care  Anxiety, depression Continue home mirtazapine, Lexapro  History of CVA -Continue home aspirin  Dementia -Continue home Aricept  HTN -Hold home lisinopril  -Hydralazine prn   Hyperlipidemia -Continue home fenofibrate  Malnutrition Type: Nutrition Problem: Severe Malnutrition Etiology: acute illness(severe duodenitis)  Diet: clear liquid DVT Prophylaxis: mechanical compression device  Advance goals of care discussion: DNR DNI  Family Communication: family was present at bedside, at the time of interview. The pt provided permission to discuss medical plan with the family. Opportunity was given to ask question and all questions were answered satisfactorily.   Disposition:  Discharge to home.  Consultants: gastroenterology  Procedures: EGD  Scheduled Meds: . collagenase   Topical Daily  . donepezil  10 mg Oral QHS  . escitalopram  10 mg Oral Daily  . gabapentin   100 mg Oral TID  . mirtazapine  3.75 mg Oral QHS  . pantoprazole  40 mg Intravenous Q12H  . povidone-iodine   Topical BID   Continuous Infusions: . dextrose 5 % and 0.45 % NaCl with KCl 20 mEq/L 75 mL/hr at 08/29/18 0659   PRN Meds: acetaminophen **OR** acetaminophen, diphenhydrAMINE, hydrALAZINE, ondansetron (ZOFRAN) IV Antibiotics: Anti-infectives (From admission, onward)   None       Objective: Physical Exam: Vitals:   08/28/18 2340 08/29/18 0924 08/29/18 1649 08/29/18 1653  BP:  138/69 125/64 125/64  Pulse:   77 78  Resp:  18 18 16   Temp:  97.8 F (36.6 C) 98 F (36.7 C) 98 F (36.7 C)  TempSrc:  Oral Oral Oral  SpO2: 95% 99%  90%  Weight:      Height:        Intake/Output Summary (Last 24 hours) at 08/29/2018 1802 Last data filed at 08/29/2018 1030 Gross per 24 hour  Intake 2404.58 ml  Output 750 ml  Net 1654.58 ml   Filed Weights   08/26/18 0727  Weight: 57.6 kg   General: Alert, Awake and Oriented to Time, Place and Person. Appear in mild distress, affect appropriate Eyes: PERRL, Conjunctiva normal ENT: Oral Mucosa clear moist. Neck: no JVD, no Abnormal Mass Or lumps Cardiovascular: S1 and S2 Present, no Murmur, Peripheral Pulses Present Respiratory: normal respiratory effort, Bilateral Air entry equal and Decreased, no use of accessory muscle, Clear to Auscultation, no Crackles, no wheezes Abdomen: Bowel Sound present, Soft and no tenderness, no hernia Skin: no redness, no Rash, no induration Extremities: no Pedal edema, no calf tenderness Neurologic: Grossly no focal neuro deficit. Bilaterally Equal motor strength  Data Reviewed: CBC: Recent Labs  Lab 08/24/18 1630  08/25/18 1428 08/26/18 0352 08/26/18 1448 08/27/18 0311 08/28/18 0838  WBC 13.0*   < > 9.9 8.6 8.2 7.1 7.7  NEUTROABS 7.0  --   --   --   --  3.2  --   HGB 11.1*   < > 8.8* 8.8* 8.5* 8.0* 9.4*  HCT 36.6   < > 26.9* 26.4* 26.5* 25.9* 29.9*  MCV 81.3   < > 78.7* 77.4* 78.4* 79.7*  81.3  PLT 407*   < > 363 379 291 334 344   < > = values in this interval not displayed.   Basic Metabolic Panel: Recent Labs  Lab 08/25/18 1238 08/25/18 1428 08/25/18 1939 08/26/18 0352 08/27/18 0311 08/28/18 0838  NA 139 142  --  142 142 138  K 3.2* 3.0* 3.2* 3.1* 3.1* 4.5  CL 108 110  --  110 111 111  CO2 21* 24  --  22 24 19*  GLUCOSE 701* 147*  --  100* 107* 131*  BUN 7* 9  --  8 6* <5*  CREATININE 1.05* 1.08*  --  0.94 0.98 0.82  CALCIUM 8.3* 8.7*  --  8.6* 8.2* 8.6*  MG  --   --   --   --  1.6*  --     Liver Function Tests: Recent Labs  Lab 08/24/18 1630 08/27/18 0311  AST 24 21  ALT 10 9  ALKPHOS 50 36*  BILITOT 1.3* 0.6  PROT 7.5 4.9*  ALBUMIN 2.5* 1.7*   Recent Labs  Lab 08/24/18 1630  LIPASE 48   No results for input(s): AMMONIA in the last 168 hours. Coagulation Profile:  Recent Labs  Lab 08/24/18 1805  INR 1.22   Cardiac Enzymes: No results for input(s): CKTOTAL, CKMB, CKMBINDEX, TROPONINI in the last 168 hours. BNP (last 3 results) No results for input(s): PROBNP in the last 8760 hours. CBG: Recent Labs  Lab 08/27/18 0336 08/27/18 0657 08/27/18 0835 08/27/18 1023 08/27/18 1226  GLUCAP 92 107* 113* 98 107*   Studies: No results found. Time spent: 35 minutes  Author: Berle Mull, MD [facility] Pager: [phone] 08/29/2018 6:02 PM  Between 7PM-7AM, please contact night-coverage at [email or web address], password [facility]

## 2018-08-30 LAB — BASIC METABOLIC PANEL
Anion gap: 5 (ref 5–15)
CO2: 21 mmol/L — ABNORMAL LOW (ref 22–32)
Calcium: 8.4 mg/dL — ABNORMAL LOW (ref 8.9–10.3)
Chloride: 110 mmol/L (ref 98–111)
Creatinine, Ser: 0.95 mg/dL (ref 0.44–1.00)
GFR calc Af Amer: >60 mL/min (ref >60)
GFR calc non Af Amer: 55 mL/min — ABNORMAL LOW (ref >60)
Glucose, Bld: 118 mg/dL — ABNORMAL HIGH (ref 70–99)
POTASSIUM: 5.1 mmol/L (ref 3.5–5.1)
Sodium: 136 mmol/L (ref 135–145)

## 2018-08-30 LAB — CBC
HEMATOCRIT: 29.9 % — AB (ref 36.0–46.0)
Hemoglobin: 9.2 g/dL — ABNORMAL LOW (ref 12.0–15.0)
MCH: 24.5 pg — ABNORMAL LOW (ref 26.0–34.0)
MCHC: 30.8 g/dL (ref 30.0–36.0)
MCV: 79.7 fL — ABNORMAL LOW (ref 80.0–100.0)
Platelets: 314 10*3/uL (ref 150–400)
RBC: 3.75 MIL/uL — ABNORMAL LOW (ref 3.87–5.11)
RDW: 22.6 % — ABNORMAL HIGH (ref 11.5–15.5)
WBC: 8.6 10*3/uL (ref 4.0–10.5)
nRBC: 1 % — ABNORMAL HIGH (ref 0.0–0.2)

## 2018-08-30 LAB — MAGNESIUM: Magnesium: 1.4 mg/dL — ABNORMAL LOW (ref 1.7–2.4)

## 2018-08-30 MED ORDER — DEXTROSE-NACL 5-0.45 % IV SOLN
INTRAVENOUS | Status: DC
  Administered 2018-08-30 – 2018-09-01 (×4): via INTRAVENOUS

## 2018-08-30 MED ORDER — PANTOPRAZOLE SODIUM 40 MG PO TBEC
40.0000 mg | DELAYED_RELEASE_TABLET | Freq: Two times a day (BID) | ORAL | Status: DC
  Administered 2018-08-30 – 2018-09-07 (×16): 40 mg via ORAL
  Filled 2018-08-30 (×16): qty 1

## 2018-08-30 MED ORDER — MAGNESIUM SULFATE 2 GM/50ML IV SOLN
2.0000 g | Freq: Once | INTRAVENOUS | Status: AC
  Administered 2018-08-30: 2 g via INTRAVENOUS
  Filled 2018-08-30: qty 50

## 2018-08-30 NOTE — Progress Notes (Signed)
[facility] Gastroenterology Progress Note  Cathy Taylor 84 y.o. 1932-12-25  CC: Nausea, vomiting, esophageal and gastric ulcers.   Subjective: Patient seen and examined at bedside.  Family at bedside.  Looks like patient does not want to eat.  No bowel movement in the last few days.  Bringing up some phlegm but denies any vomiting.   ROS : neg for chest pain and sob.  Positive for decreased appetite.   Objective: Vital signs in last 24 hours: Vitals:   08/29/18 2220 08/30/18 0819  BP: 114/68 110/68  Pulse: 81 85  Resp: 18 18  Temp: 98.1 F (36.7 C) 98 F (36.7 C)  SpO2: (!) 61% 98%    Physical Exam:  General.  Alert/oriented x3.  Not in acute distress. Abdomen.  Soft, nontender, nondistended, bowel sounds present. LE : No significant edema Psych.  Somewhat depressed.  Lab Results: Recent Labs    08/28/18 0838 08/30/18 0219  NA 138 136  K 4.5 5.1  CL 111 110  CO2 19* 21*  GLUCOSE 131* 118*  BUN <5* <5*  CREATININE 0.82 0.95  CALCIUM 8.6* 8.4*  MG  --  1.4*   No results for input(s): AST, ALT, ALKPHOS, BILITOT, PROT, ALBUMIN in the last 72 hours. Recent Labs    08/28/18 0838 08/30/18 0219  WBC 7.7 8.6  HGB 9.4* 9.2*  HCT 29.9* 29.9*  MCV 81.3 79.7*  PLT 344 314   No results for input(s): LABPROT, INR in the last 72 hours.    Assessment/Plan: - Multiple esophageal and gastric ulcers. - nausea and vomiting.  Improving -Failure to thrive.  Recommendations ------------------------- -Advance diet to soft diet. -Long discussed with patient's daughter.  According to daughter, she is bringing up some phlegm after drinking juice but denied any vomiting.  No bowel movement in last few days.  Trial of MiraLAX. -Patient with decreased appetite and poor oral intake which could be psychological in nature. -Continue IV twice daily PPI. - D/W Dr. Posey Pronto   -GI will follow  Otis Brace MD, [place] 08/30/2018, 12:22 PM  Contact #  [phone]

## 2018-08-30 NOTE — Progress Notes (Signed)
[facility] Progress Note  Patient: Cathy Taylor HOZ:[id number]   PCP: Maryella Shivers, MD DOB: 25-Apr-1933   DOA: 08/24/2018   DOS: 08/30/2018   Date of Service: the patient was seen and examined on 08/30/2018  Brief hospital course: Pt. with PMH of abdominal hernia, anxiety, depression, GERD, hypertension, hyperlipidemia, CHF, CKD 3, CVA, type 2 diabetes ; admitted on 08/24/2018, presented with complaint of nausea vomiting and weight loss, was found to have gastritis and duodenitis. Currently further plan is further supportive measures.  Subjective: Fatigue and tired.  No vomiting.  No BM as well.  Breathing okay.  Assessment and Plan: Failure to thrive, nausea, vomiting Acute gastritis and duodenitis. Nonbleeding gastric ulcers as well.  Patient is presenting with progressively worse nausea and vomiting related to eating for the past few months.  losing weight.   anion gap 16 on admission likely due to lactic acidosis.  Lactate 4.37 >> 1.1 with fluid resuscitation.  Procalcitonin <0.10.   LFTs normal.  Lipase normal.  FOBT negative. Continue IV fluids. Underwent EGD which showed severe gastritis and duodenitis with nonbleeding ulcers. On Protonix drip after EGD. Now change to q12.  Also advance diet to full liquid Continue scheduled Reglan IV.  Monitor.  Mild leukocytosis Likely reactive.  Patient is afebrile.  Lactic acidosis resolved after IV fluid resuscitation.  Procalcitonin <0.10.  Patient noted to have mild bibasilar rales on exam.  However, chest x-ray showing no active cardiopulmonary disease.  Physical deconditioning -PT evaluation  Chronic anemia -Hemoglobin 11.9, stable.  Hypoglycemia, history of type 2 diabetes Patient has a history of type 2 diabetes and is currently not on any home medications.  CBG as low as 57 in the setting of decreased p.o. Intake.  Received an amp of D50. -D5-LR infusion  -Regular diet -Continue to monitor CBGs  CKD  3 Creatinine 1.4, was 1.2 a month ago. Mild elevation in creatinine likely due to dehydration from decreased p.o. Intake. -IV fluid hydration -Repeat BMP in a.m. -Hold home lisinopril  Bilateral heel pressure sores -Wound care  Anxiety, depression Continue home mirtazapine, Lexapro  History of CVA -Continue home aspirin  Dementia -Continue home Aricept  HTN -Hold home lisinopril  -Hydralazine prn   Hyperlipidemia -Continue home fenofibrate  Malnutrition Type: Nutrition Problem: Severe Malnutrition Etiology: acute illness(severe duodenitis)  Diet: clear liquid DVT Prophylaxis: mechanical compression device  Advance goals of care discussion: DNR DNI  Family Communication: family was present at bedside, at the time of interview. The pt provided permission to discuss medical plan with the family. Opportunity was given to ask question and all questions were answered satisfactorily.   Disposition:  Discharge to home.  Consultants: gastroenterology  Procedures: EGD  Scheduled Meds: . collagenase   Topical Daily  . donepezil  10 mg Oral QHS  . escitalopram  10 mg Oral Daily  . metoCLOPramide (REGLAN) injection  5 mg Intravenous Q8H  . mirtazapine  3.75 mg Oral QHS  . pantoprazole  40 mg Oral BID AC  . povidone-iodine   Topical BID   Continuous Infusions: . dextrose 5 % and 0.45% NaCl 50 mL/hr at 08/30/18 1051   PRN Meds: acetaminophen **OR** acetaminophen, hydrALAZINE, ondansetron (ZOFRAN) IV Antibiotics: Anti-infectives (From admission, onward)   None       Objective: Physical Exam: Vitals:   08/29/18 1649 08/29/18 1653 08/29/18 2220 08/30/18 0819  BP: 125/64 125/64 114/68 110/68  Pulse: 77 78 81 85  Resp: 18 16 18 18   Temp: 98 F (36.7 C)  98 F (36.7 C) 98.1 F (36.7 C) 98 F (36.7 C)  TempSrc: Oral Oral Oral Oral  SpO2:  90% (!) 61% 98%  Weight:      Height:        Intake/Output Summary (Last 24 hours) at 08/30/2018 1429 Last data filed  at 08/30/2018 0500 Gross per 24 hour  Intake 1215.9 ml  Output 701 ml  Net 514.9 ml   Filed Weights   08/26/18 0727  Weight: 57.6 kg   General: Alert, Awake and Oriented to Time, Place and Person. Appear in mild distress, affect appropriate Eyes: PERRL, Conjunctiva normal ENT: Oral Mucosa clear moist. Neck: no JVD, no Abnormal Mass Or lumps Cardiovascular: S1 and S2 Present, no Murmur, Peripheral Pulses Present Respiratory: normal respiratory effort, Bilateral Air entry equal and Decreased, no use of accessory muscle, Clear to Auscultation, no Crackles, no wheezes Abdomen: Bowel Sound present, Soft and no tenderness, no hernia Skin: no redness, no Rash, no induration Extremities: no Pedal edema, no calf tenderness Neurologic: Grossly no focal neuro deficit. Bilaterally Equal motor strength  Data Reviewed: CBC: Recent Labs  Lab 08/24/18 1630  08/26/18 0352 08/26/18 1448 08/27/18 0311 08/28/18 0838 08/30/18 0219  WBC 13.0*   < > 8.6 8.2 7.1 7.7 8.6  NEUTROABS 7.0  --   --   --  3.2  --   --   HGB 11.1*   < > 8.8* 8.5* 8.0* 9.4* 9.2*  HCT 36.6   < > 26.4* 26.5* 25.9* 29.9* 29.9*  MCV 81.3   < > 77.4* 78.4* 79.7* 81.3 79.7*  PLT 407*   < > 379 291 334 344 314   < > = values in this interval not displayed.   Basic Metabolic Panel: Recent Labs  Lab 08/25/18 1428 08/25/18 1939 08/26/18 0352 08/27/18 0311 08/28/18 0838 08/30/18 0219  NA 142  --  142 142 138 136  K 3.0* 3.2* 3.1* 3.1* 4.5 5.1  CL 110  --  110 111 111 110  CO2 24  --  22 24 19* 21*  GLUCOSE 147*  --  100* 107* 131* 118*  BUN 9  --  8 6* <5* <5*  CREATININE 1.08*  --  0.94 0.98 0.82 0.95  CALCIUM 8.7*  --  8.6* 8.2* 8.6* 8.4*  MG  --   --   --  1.6*  --  1.4*    Liver Function Tests: Recent Labs  Lab 08/24/18 1630 08/27/18 0311  AST 24 21  ALT 10 9  ALKPHOS 50 36*  BILITOT 1.3* 0.6  PROT 7.5 4.9*  ALBUMIN 2.5* 1.7*   Recent Labs  Lab 08/24/18 1630  LIPASE 48   No results for input(s):  AMMONIA in the last 168 hours. Coagulation Profile: Recent Labs  Lab 08/24/18 1805  INR 1.22   Cardiac Enzymes: No results for input(s): CKTOTAL, CKMB, CKMBINDEX, TROPONINI in the last 168 hours. BNP (last 3 results) No results for input(s): PROBNP in the last 8760 hours. CBG: Recent Labs  Lab 08/27/18 0336 08/27/18 0657 08/27/18 0835 08/27/18 1023 08/27/18 1226  GLUCAP 92 107* 113* 98 107*   Studies: No results found. Time spent: 35 minutes  Author: Berle Mull, MD [facility] Pager: [phone] 08/30/2018 2:29 PM  Between 7PM-7AM, please contact night-coverage at [email or web address], password [facility]

## 2018-08-31 LAB — BASIC METABOLIC PANEL
Anion gap: 6 (ref 5–15)
BUN: 5 mg/dL — ABNORMAL LOW (ref 8–23)
CHLORIDE: 110 mmol/L (ref 98–111)
CO2: 20 mmol/L — ABNORMAL LOW (ref 22–32)
Calcium: 8.5 mg/dL — ABNORMAL LOW (ref 8.9–10.3)
Creatinine, Ser: 0.92 mg/dL (ref 0.44–1.00)
GFR calc Af Amer: >60 mL/min (ref >60)
GFR calc non Af Amer: 58 mL/min — ABNORMAL LOW (ref >60)
Glucose, Bld: 92 mg/dL (ref 70–99)
POTASSIUM: 4.5 mmol/L (ref 3.5–5.1)
Sodium: 136 mmol/L (ref 135–145)

## 2018-08-31 LAB — CBC
HCT: 25.2 % — ABNORMAL LOW (ref 36.0–46.0)
HEMOGLOBIN: 8.3 g/dL — AB (ref 12.0–15.0)
MCH: 25.6 pg — ABNORMAL LOW (ref 26.0–34.0)
MCHC: 32.9 g/dL (ref 30.0–36.0)
MCV: 77.8 fL — ABNORMAL LOW (ref 80.0–100.0)
Platelets: 246 10*3/uL (ref 150–400)
RBC: 3.24 MIL/uL — AB (ref 3.87–5.11)
RDW: 21.9 % — ABNORMAL HIGH (ref 11.5–15.5)
WBC: 7 10*3/uL (ref 4.0–10.5)
nRBC: 0.9 % — ABNORMAL HIGH (ref 0.0–0.2)

## 2018-08-31 LAB — GLUCOSE, CAPILLARY: GLUCOSE-CAPILLARY: 91 mg/dL (ref 70–99)

## 2018-08-31 MED ORDER — HYDROCODONE-ACETAMINOPHEN 5-325 MG PO TABS
1.0000 | ORAL_TABLET | Freq: Four times a day (QID) | ORAL | Status: DC | PRN
  Administered 2018-08-31: 1 via ORAL
  Filled 2018-08-31: qty 1

## 2018-08-31 MED ORDER — HYDROCODONE-ACETAMINOPHEN 5-325 MG PO TABS
1.0000 | ORAL_TABLET | Freq: Two times a day (BID) | ORAL | Status: DC | PRN
  Administered 2018-08-31: 1 via ORAL
  Filled 2018-08-31: qty 1

## 2018-08-31 MED ORDER — ALBUMIN HUMAN 25 % IV SOLN
12.5000 g | Freq: Once | INTRAVENOUS | Status: AC
  Administered 2018-08-31: 12.5 g via INTRAVENOUS
  Filled 2018-08-31: qty 50

## 2018-08-31 NOTE — Progress Notes (Signed)
Physical Therapy Treatment Patient Details Name: Cathy Taylor MRN: [id number] DOB: Dec 28, 1932 Today's Date: 08/31/2018    History of Present Illness Pt is an 84 y/o female admitted secondary to failure to thrive and emesis. CT of abdomen revealed small layering dependent gallstone within gallbladder. Per notes, pt likely for EGD on 12/5. PMH includes dementia, HTN, DM, CVA, L TSA, CKD, and R breast cancer.     PT Comments    Patient seen for mobility progression. Continues to require considerable physical assist for all mobility and transfers. Use of STEDY for OOB transfers for patient/therapist safety. Will continue to follow.    Follow Up Recommendations  SNF;Supervision/Assistance - 24 hour     Equipment Recommendations  None recommended by PT    Recommendations for Other Services       Precautions / Restrictions Precautions Precautions: Fall Restrictions Weight Bearing Restrictions: No    Mobility  Bed Mobility Overal bed mobility: Needs Assistance Bed Mobility: Supine to Sit     Supine to sit: Mod assist;+2 for physical assistance     General bed mobility comments: Assist to bring legs off of bed, elevate trunk into sitting, and bring hips to EOB. Assist to lower trunk into supine; MIn/Mod for upright sitting balance  Transfers Overall transfer level: Needs assistance Equipment used: Ambulation equipment used Transfers: Sit to/from Stand Sit to Stand: Max assist;+2 physical assistance         General transfer comment: Assist to bring hips up. Used stedy for bed to recliner transfers  Ambulation/Gait                 Marine scientist [place] (Stroke Patients Only)       Balance Overall balance assessment: Needs assistance Sitting-balance support: Feet supported;Bilateral upper extremity supported Sitting balance-Leahy Scale: Poor Sitting balance - Comments: UE assist and Min/Mod due to L lean Postural  control: Left lateral lean;Posterior lean Standing balance support: Bilateral upper extremity supported Standing balance-Leahy Scale: Zero                              Cognition Arousal/Alertness: Awake/alert Behavior During Therapy: Flat affect Overall Cognitive Status: History of cognitive impairments - at baseline                                 General Comments: awake and conversative at time      Exercises      General Comments        Pertinent Vitals/Pain Pain Assessment: No/denies pain    Home Living                      Prior Function            PT Goals (current goals can now be found in the care plan section) Acute Rehab PT Goals PT Goal Formulation: Patient unable to participate in goal setting Time For Goal Achievement: 09/08/18 Potential to Achieve Goals: Fair Progress towards PT goals: Progressing toward goals    Frequency    Min 2X/week      PT Plan Current plan remains appropriate    Co-evaluation              AM-PAC PT "6 Clicks" Mobility   Outcome Measure  Help needed turning  from your back to your side while in a flat bed without using bedrails?: A Lot Help needed moving from lying on your back to sitting on the side of a flat bed without using bedrails?: A Lot Help needed moving to and from a bed to a chair (including a wheelchair)?: Total Help needed standing up from a chair using your arms (e.g., wheelchair or bedside chair)?: Total Help needed to walk in hospital room?: Total Help needed climbing 3-5 steps with a railing? : Total 6 Click Score: 8    End of Session Equipment Utilized During Treatment: Gait belt Activity Tolerance: Patient tolerated treatment well Patient left: with call bell/phone within reach;in chair;with chair alarm set Nurse Communication: Mobility status PT Visit Diagnosis: Unsteadiness on feet (R26.81);Muscle weakness (generalized) (M62.81);Difficulty in walking, not  elsewhere classified (R26.2)     Time: 3149-7026 PT Time Calculation (min) (ACUTE ONLY): 28 min  Charges:  $Therapeutic Activity: 23-37 mins                     Lanney Gins, PT, DPT Supplemental Physical Therapist 08/31/18 3:35 PM Pager: [phone] Office: [phone]

## 2018-08-31 NOTE — Progress Notes (Signed)
[facility] Gastroenterology Progress Note  Cathy Taylor 84 y.o. 04-Aug-1933  CC: Nausea, vomiting, esophageal and gastric ulcers.   Subjective: Patient seen and examined at bedside.  Patient not answering most of the question but discussed with the nursing staff.  Some improvement in oral intake noted.  Last bowel movement yesterday was nonbloody.  Denied any further nausea vomiting.   Objective: Vital signs in last 24 hours: Vitals:   08/31/18 0409 08/31/18 0830  BP:  109/71  Pulse:  97  Resp:  12  Temp:  98.8 F (37.1 C)  SpO2: 94% 98%    Physical Exam:  General.  Alert/oriented x2.  Not in acute distress. Abdomen.  Soft, nontender, nondistended, bowel sounds present. LE : No significant edema Psych.  Somewhat depressed.  Lab Results: Recent Labs    08/30/18 0219 08/31/18 0303  NA 136 136  K 5.1 4.5  CL 110 110  CO2 21* 20*  GLUCOSE 118* 92  BUN <5* 5*  CREATININE 0.95 0.92  CALCIUM 8.4* 8.5*  MG 1.4*  --    No results for input(s): AST, ALT, ALKPHOS, BILITOT, PROT, ALBUMIN in the last 72 hours. Recent Labs    08/30/18 0219 08/31/18 0303  WBC 8.6 7.0  HGB 9.2* 8.3*  HCT 29.9* 25.2*  MCV 79.7* 77.8*  PLT 314 246   No results for input(s): LABPROT, INR in the last 72 hours.    Assessment/Plan: - Multiple esophageal and gastric ulcers. - nausea and vomiting.  Improving -Failure to thrive. -Drop in hemoglobin.  Appears dilutional as all cell lines have decreased.  Recommendations ------------------------- -Continue soft diet.  Slowly advance to regular. -Continue twice daily PPI at least for next 6 to 8 weeks and then once a day PPI.  -GI will follow  Otis Brace MD, [place] 08/31/2018, 8:37 AM  Contact #  [phone]

## 2018-08-31 NOTE — Progress Notes (Signed)
Nutrition Follow Up  DOCUMENTATION CODES:   Severe malnutrition in context of acute illness/injury  INTERVENTION:    Magic cup TID with meals, each supplement provides 290 kcal and 9 grams of protein  NUTRITION DIAGNOSIS:   Severe Malnutrition related to acute illness(severe duodenitis) as evidenced by severe muscle depletion, mild to moderate fat depletion and 10% weight loss x 2 months, ongoing  GOAL:   Patient will meet greater than or equal to 90% of their needs, progressing   MONITOR:   PO intake, Supplement acceptance, Labs, Skin, Weight trends, I & O's  ASSESSMENT:    84 yo Female with PMH of abdominal hernia, HTN, CKD, CVA, DM and recent UTI and ischemic versus infectious colitis; presents with persistent worsening N/V and weight loss.  Pt minimally responsive upon RD visit. Advanced to Full Liquids 12/7 then Soft diet 12/8. Says she didn't eat lunch. Doesn't like Ensure supplements.  PO intake poor at 0-30% per flowsheet records. GI note reviewed. Pt having no further nausea & vomiting.  IV Reglan started 12/8. CBG 91.  Labs reviewed. CO2 20 (L). BUN 5 (L). Hg 8.3 (L).  Diet Order:   Diet Order            DIET SOFT Room service appropriate? Yes; Fluid consistency: Thin  Diet effective now             EDUCATION NEEDS:   Not appropriate for education at this time  Skin:  Skin Assessment: Skin Integrity Issues: Skin Integrity Issues:: Unstageable Unstageable: Right lateral foot   Last BM:  12/10   Wt Readings from Last 10 Encounters:  08/26/18 57.6 kg  07/05/18 63.6 kg  04/22/18 67.1 kg  04/21/18 66.3 kg  10/21/17 68.9 kg  04/20/17 65.3 kg  04/08/17 66.6 kg  10/16/16 60.9 kg  04/15/16 55.7 kg  04/07/16 55.5 kg   Height:   Ht Readings from Last 1 Encounters:  08/26/18 5\' 3"  (1.6 m)   Weight:   Wt Readings from Last 1 Encounters:  08/26/18 57.6 kg   BMI:  Body mass index is 22.5 kg/m.  Estimated Nutritional Needs:   Kcal:   1400-1600  Protein:  70-85 gm  Fluid:  >/= 1.5 L  Arthur Holms, RD, LDN Pager #: [phone] After-Hours Pager #: [phone]

## 2018-08-31 NOTE — Progress Notes (Signed)
[facility] Progress Note  Patient: Cathy Taylor ZOX:[id number]   PCP: Maryella Shivers, MD DOB: 1932-12-11   DOA: 08/24/2018   DOS: 08/31/2018   Date of Service: the patient was seen and examined on 08/31/2018  Brief hospital course: Pt. with PMH of abdominal hernia, anxiety, depression, GERD, hypertension, hyperlipidemia, CKD 3, CVA, type 2 diabetes ; admitted on 08/24/2018, presented with complaint of nausea vomiting and weight loss, was found to have gastritis and duodenitis.  GI was consulted and patient underwent EGD, 08/26/2018 which showed, multiple esophageal ulcer with acute gastritis.  Patient was placed on IV Protonix drip and now transitioned to oral.  Still unable to tolerate oral diet. Currently further plan is further supportive measures.  Subjective: While the patient denies any acute complaint remains sleepy and drowsy.  No nausea no vomiting no fever no chills reported in last 24 hours.  Had 2 bowel movements yesterday.  Assessment and Plan: Failure to thrive, nausea, vomiting Acute gastritis and duodenitis. Multiple bleeding and nonbleeding gastric ulcers as well.  Patient is presenting with progressively worse nausea and vomiting related to eating for the past few months.  losing weight.   anion gap 16 on admission likely due to lactic acidosis.  Lactate 4.37 >> 1.1 with fluid resuscitation.  Procalcitonin <0.10.   LFTs normal.  Lipase normal.  FOBT negative. Continue IV fluids. Underwent EGD which showed severe gastritis and duodenitis with nonbleeding ulcers. On Protonix drip after EGD. Now change to q12.  Also advance diet to soft diet Continue scheduled Reglan IV.  Monitor.  Mild leukocytosis Likely reactive.  Patient is afebrile.  Lactic acidosis resolved after IV fluid resuscitation.  Procalcitonin <0.10.  Patient noted to have mild bibasilar rales on exam.  However, chest x-ray showing no active cardiopulmonary disease.  Physical deconditioning -PT  evaluation  Anemia due to chronic blood loss No active bleeding here in the hospital. H&H remained stable.  Monitor.  Type 2 diabetes mellitus, uncontrolled with hypoglycemia, no complication Patient has a history of type 2 diabetes and is currently not on any home medications.   Due to persistent hypoglycemia the patient is on D5 half-normal saline.  CKD 3 Creatinine 1.4, was 1.2 a month ago. Mild elevation in creatinine likely due to dehydration from decreased p.o. Intake. -IV fluid hydration -Repeat BMP in a.m. -Hold home lisinopril  Bilateral heel pressure sores -Wound care  Anxiety, depression Holding home mirtazapine, Lexapro due to lethargy and sleepiness  History of CVA Patient is an 81 mg aspirin at home. Currently on hold secondary to gastritis. Need to resume it in 1 to 2 weeks on discharge  Dementia -Holding home Aricept  HTN -Hold home lisinopril  Blood pressure rather soft.  Will monitor.  Hyperlipidemia -Continue home fenofibrate  Malnutrition Type: Nutrition Problem: Severe Malnutrition Etiology: acute illness(severe duodenitis)  Diet: soft diet DVT Prophylaxis: mechanical compression device  Advance goals of care discussion: DNR DNI  Family Communication: family was present at bedside, at the time of interview. The pt provided permission to discuss medical plan with the family. Opportunity was given to ask question and all questions were answered satisfactorily.   Disposition:  Discharge to SNF once oral intake is adequate.  Earliest in 2 days  Consultants: gastroenterology  Procedures: EGD  Scheduled Meds: . collagenase   Topical Daily  . metoCLOPramide (REGLAN) injection  5 mg Intravenous Q8H  . pantoprazole  40 mg Oral BID AC  . povidone-iodine   Topical BID   Continuous Infusions: .  dextrose 5 % and 0.45% NaCl 50 mL/hr at 08/30/18 2118   PRN Meds: acetaminophen **OR** acetaminophen, HYDROcodone-acetaminophen, ondansetron  (ZOFRAN) IV Antibiotics: Anti-infectives (From admission, onward)   None       Objective: Physical Exam: Vitals:   08/30/18 1632 08/30/18 2353 08/31/18 0409 08/31/18 0830  BP: 118/80 (!) 93/58  109/71  Pulse: 73 75  97  Resp: 16 12  12   Temp: 98.1 F (36.7 C) 98.3 F (36.8 C)  98.8 F (37.1 C)  TempSrc: Oral Oral  Oral  SpO2: 100% (!) 83% 94% 98%  Weight:      Height:        Intake/Output Summary (Last 24 hours) at 08/31/2018 1557 Last data filed at 08/31/2018 1500 Gross per 24 hour  Intake 1188.61 ml  Output 1352 ml  Net -163.39 ml   Filed Weights   08/26/18 0727  Weight: 57.6 kg   General: Alert, Awake and Oriented to Time, Place and Person. Appear in mild distress, affect appropriate Eyes: PERRL, Conjunctiva normal ENT: Oral Mucosa clear moist. Neck: no JVD, no Abnormal Mass Or lumps Cardiovascular: S1 and S2 Present, no Murmur, Peripheral Pulses Present Respiratory: normal respiratory effort, Bilateral Air entry equal and Decreased, no use of accessory muscle, Clear to Auscultation, no Crackles, no wheezes Abdomen: Bowel Sound present, Soft and no tenderness, no hernia Skin: no redness, no Rash, no induration Extremities: no Pedal edema, no calf tenderness Neurologic: Grossly no focal neuro deficit. Bilaterally Equal motor strength  Data Reviewed: CBC: Recent Labs  Lab 08/24/18 1630  08/26/18 1448 08/27/18 0311 08/28/18 0838 08/30/18 0219 08/31/18 0303  WBC 13.0*   < > 8.2 7.1 7.7 8.6 7.0  NEUTROABS 7.0  --   --  3.2  --   --   --   HGB 11.1*   < > 8.5* 8.0* 9.4* 9.2* 8.3*  HCT 36.6   < > 26.5* 25.9* 29.9* 29.9* 25.2*  MCV 81.3   < > 78.4* 79.7* 81.3 79.7* 77.8*  PLT 407*   < > 291 334 344 314 246   < > = values in this interval not displayed.   Basic Metabolic Panel: Recent Labs  Lab 08/26/18 0352 08/27/18 0311 08/28/18 0838 08/30/18 0219 08/31/18 0303  NA 142 142 138 136 136  K 3.1* 3.1* 4.5 5.1 4.5  CL 110 111 111 110 110  CO2 22 24  19* 21* 20*  GLUCOSE 100* 107* 131* 118* 92  BUN 8 6* <5* <5* 5*  CREATININE 0.94 0.98 0.82 0.95 0.92  CALCIUM 8.6* 8.2* 8.6* 8.4* 8.5*  MG  --  1.6*  --  1.4*  --     Liver Function Tests: Recent Labs  Lab 08/24/18 1630 08/27/18 0311  AST 24 21  ALT 10 9  ALKPHOS 50 36*  BILITOT 1.3* 0.6  PROT 7.5 4.9*  ALBUMIN 2.5* 1.7*   Recent Labs  Lab 08/24/18 1630  LIPASE 48   No results for input(s): AMMONIA in the last 168 hours. Coagulation Profile: Recent Labs  Lab 08/24/18 1805  INR 1.22   Cardiac Enzymes: No results for input(s): CKTOTAL, CKMB, CKMBINDEX, TROPONINI in the last 168 hours. BNP (last 3 results) No results for input(s): PROBNP in the last 8760 hours. CBG: Recent Labs  Lab 08/27/18 0657 08/27/18 0835 08/27/18 1023 08/27/18 1226 08/31/18 0958  GLUCAP 107* 113* 98 107* 91   Studies: No results found. Time spent: 35 minutes  Author: Berle Mull, MD [facility] Pager: [phone]  08/31/2018 3:57 PM  Between 7PM-7AM, please contact night-coverage at [email or web address], password [facility]

## 2018-09-01 ENCOUNTER — Inpatient Hospital Stay (HOSPITAL_COMMUNITY): Payer: Medicare Other

## 2018-09-01 DIAGNOSIS — F419 Anxiety disorder, unspecified: Secondary | ICD-10-CM

## 2018-09-01 DIAGNOSIS — F329 Major depressive disorder, single episode, unspecified: Secondary | ICD-10-CM

## 2018-09-01 DIAGNOSIS — N183 Chronic kidney disease, stage 3 (moderate): Secondary | ICD-10-CM

## 2018-09-01 LAB — GLUCOSE, CAPILLARY
Glucose-Capillary: 56 mg/dL — ABNORMAL LOW (ref 70–99)
Glucose-Capillary: 65 mg/dL — ABNORMAL LOW (ref 70–99)
Glucose-Capillary: 56 mg/dL — ABNORMAL LOW (ref 70–99)
Glucose-Capillary: 108 mg/dL — ABNORMAL HIGH (ref 70–99)
Glucose-Capillary: 103 mg/dL — ABNORMAL HIGH (ref 70–99)
Glucose-Capillary: 27 mg/dL — CL (ref 70–99)
Glucose-Capillary: 79 mg/dL (ref 70–99)
Glucose-Capillary: <10 mg/dL — CL (ref 70–99)

## 2018-09-01 LAB — BASIC METABOLIC PANEL
Anion gap: 7 (ref 5–15)
BUN: <5 mg/dL — ABNORMAL LOW (ref 8–23)
CO2: 21 mmol/L — ABNORMAL LOW (ref 22–32)
CREATININE: 1.01 mg/dL — AB (ref 0.44–1.00)
Calcium: 8.8 mg/dL — ABNORMAL LOW (ref 8.9–10.3)
Chloride: 109 mmol/L (ref 98–111)
GFR, EST AFRICAN AMERICAN: 60 mL/min — AB (ref >60)
GFR, EST NON AFRICAN AMERICAN: 51 mL/min — AB (ref >60)
Glucose, Bld: 88 mg/dL (ref 70–99)
Potassium: 4.5 mmol/L (ref 3.5–5.1)
SODIUM: 137 mmol/L (ref 135–145)

## 2018-09-01 LAB — CBC
HCT: 30.1 % — ABNORMAL LOW (ref 36.0–46.0)
Hemoglobin: 9.5 g/dL — ABNORMAL LOW (ref 12.0–15.0)
MCH: 25.1 pg — ABNORMAL LOW (ref 26.0–34.0)
MCHC: 31.6 g/dL (ref 30.0–36.0)
MCV: 79.6 fL — ABNORMAL LOW (ref 80.0–100.0)
Platelets: 248 10*3/uL (ref 150–400)
RBC: 3.78 MIL/uL — ABNORMAL LOW (ref 3.87–5.11)
RDW: 22.3 % — ABNORMAL HIGH (ref 11.5–15.5)
WBC: 9.1 10*3/uL (ref 4.0–10.5)
nRBC: 0.3 % — ABNORMAL HIGH (ref 0.0–0.2)

## 2018-09-01 MED ORDER — FUROSEMIDE 10 MG/ML IJ SOLN
40.0000 mg | Freq: Two times a day (BID) | INTRAMUSCULAR | Status: AC
  Administered 2018-09-01 – 2018-09-02 (×2): 40 mg via INTRAVENOUS
  Filled 2018-09-01 (×2): qty 4

## 2018-09-01 MED ORDER — ENSURE ENLIVE PO LIQD
237.0000 mL | Freq: Two times a day (BID) | ORAL | Status: DC
  Administered 2018-09-01 – 2018-09-03 (×3): 237 mL via ORAL

## 2018-09-01 MED ORDER — GLUCOSE 40 % PO GEL
ORAL | Status: AC
  Administered 2018-09-01: 37.5 g via ORAL
  Filled 2018-09-01: qty 1

## 2018-09-01 MED ORDER — DEXTROSE 50 % IV SOLN
25.0000 g | INTRAVENOUS | Status: AC
  Administered 2018-09-01: 25 g via INTRAVENOUS

## 2018-09-01 MED ORDER — GLUCOSE 4 G PO CHEW
CHEWABLE_TABLET | ORAL | Status: AC
  Filled 2018-09-01: qty 1

## 2018-09-01 MED ORDER — DONEPEZIL HCL 10 MG PO TABS
10.0000 mg | ORAL_TABLET | Freq: Every day | ORAL | Status: DC
  Administered 2018-09-01 – 2018-09-06 (×5): 10 mg via ORAL
  Filled 2018-09-01 (×6): qty 1

## 2018-09-01 MED ORDER — DEXTROSE 50 % IV SOLN
INTRAVENOUS | Status: AC
  Administered 2018-09-01: 50 mL
  Filled 2018-09-01: qty 50

## 2018-09-01 MED ORDER — MIRTAZAPINE 15 MG PO TABS
7.5000 mg | ORAL_TABLET | Freq: Every day | ORAL | Status: DC
  Administered 2018-09-01 – 2018-09-06 (×5): 7.5 mg via ORAL
  Filled 2018-09-01 (×6): qty 1

## 2018-09-01 NOTE — Progress Notes (Signed)
[facility] Progress Note  Patient: Cathy Taylor KGU:[id number]   PCP: Maryella Shivers, MD DOB: 1932-09-28   DOA: 08/24/2018   DOS: 09/01/2018   Date of Service: the patient was seen and examined on 09/01/2018  Brief hospital course: Pt. with PMH of dementia, CVA, chronic kidney disease stage III, type 2 diabetes, abdominal hernia, anxiety, depression, GERD, hypertension, hyperlipidemia admitted on 08/24/2018, with complaint of nausea vomiting and weight loss, was found to have gastritis and duodenitis.  GI was consulted and patient underwent EGD, 08/26/2018 which showed, multiple esophageal ulcer with acute gastritis.  Patient was placed on IV Protonix drip and now transitioned to oral.  Still unable to tolerate oral diet.  Subjective:  -Oral intake remains poor, although she is starting to eat some this morning  Assessment and Plan:  Esophageal/gastric ulcers, gastritis and duodenitis -Admitted with ongoing failure to thrive, nausea vomiting, weight loss -Gastroenterology consulted, underwent EGD which showed severe gastritis gastric ulcers, esophageal ulcer and duodenitis -Now transition to oral Protonix -Encourage p.o. Intake, advance diet -Discontinue IV Reglan  Acute hypoxic respiratory failure -Clinically appears volume overloaded -We will check chest x-ray as well -Start IV Lasix today  Mild leukocytosis -Reactive, resolved  Anemia due to chronic blood loss -As above, no overt bleeding at this time -Hemoglobin stable, monitor  Type 2 diabetes mellitus, uncontrolled with hypoglycemia -Was persistently hypoglycemic in the setting of poor p.o. intake -Cutdown D5 infusion to KVO -Check hemoglobin A1c  Chronic kidney disease stage II -Creatinine was 1.4 on admission -Improved with hydration, lisinopril on hold we will restart this in 1 to 2 days -Monitor kidney function with diuresis  Bilateral heel pressure sores -Wound care  Anxiety, depression Holding home  mirtazapine, Lexapro due to lethargy and sleepiness  History of CVA -She was on 81 mg of aspirin at baseline this was held secondary to severe gastritis, recommend resumption in 1 to 2 weeks  Dementia -restart Aricept  HTN -Hold home lisinopril  Blood pressure rather soft.  Will monitor.  Hyperlipidemia -Continue home fenofibrate  Malnutrition Type: Nutrition Problem: Severe Malnutrition Etiology: acute illness(severe duodenitis)  Diet: soft diet DVT Prophylaxis: mechanical compression device  Advance goals of care discussion: DNR DNI  Family Communication: No family at bedside  Disposition: SNF, pending improvement in hypoxia, diuresis and p.o. intake  Consultants: gastroenterology  Procedures: EGD  Scheduled Meds: . collagenase   Topical Daily  . glucose      . mirtazapine  7.5 mg Oral QHS  . pantoprazole  40 mg Oral BID AC  . povidone-iodine   Topical BID   Continuous Infusions: . dextrose 5 % and 0.45% NaCl 50 mL/hr at 09/01/18 0630   PRN Meds: acetaminophen **OR** acetaminophen, HYDROcodone-acetaminophen, ondansetron (ZOFRAN) IV Antibiotics: Anti-infectives (From admission, onward)   None       Objective: Physical Exam: Vitals:   08/31/18 0830 08/31/18 1658 08/31/18 2338 09/01/18 0858  BP: 109/71 121/67 (!) 155/101 (!) 151/73  Pulse: 97 67 68 67  Resp: 12 12 19    Temp: 98.8 F (37.1 C) (!) 97.5 F (36.4 C) (!) 97.5 F (36.4 C) 98.1 F (36.7 C)  TempSrc: Oral Oral Oral Oral  SpO2: 98% 100% 100% (!) 72%  Weight:      Height:        Intake/Output Summary (Last 24 hours) at 09/01/2018 1358 Last data filed at 09/01/2018 0630 Gross per 24 hour  Intake 983.94 ml  Output 300 ml  Net 683.94 ml   [employer]  08/26/18 0727  Weight: 57.6 kg   Gen: Awake, Alert, Oriented X 2, no distress, sitting up in bed HEENT: PERRLA, Neck supple, no JVD Lungs: Fine bilateral basilar crackles CVS: RRR,No Gallops,Rubs or new Murmurs Abd: soft, Non  tender, non distended, BS present Extremities: 1+ edema bilateral lower extremities, has 2+ edema in right upper arm Skin: no new rashes  Data Reviewed: CBC: Recent Labs  Lab 08/27/18 0311 08/28/18 0838 08/30/18 0219 08/31/18 0303 09/01/18 0349  WBC 7.1 7.7 8.6 7.0 9.1  NEUTROABS 3.2  --   --   --   --   HGB 8.0* 9.4* 9.2* 8.3* 9.5*  HCT 25.9* 29.9* 29.9* 25.2* 30.1*  MCV 79.7* 81.3 79.7* 77.8* 79.6*  PLT 334 344 314 246 810   Basic Metabolic Panel: Recent Labs  Lab 08/27/18 0311 08/28/18 0838 08/30/18 0219 08/31/18 0303 09/01/18 0349  NA 142 138 136 136 137  K 3.1* 4.5 5.1 4.5 4.5  CL 111 111 110 110 109  CO2 24 19* 21* 20* 21*  GLUCOSE 107* 131* 118* 92 88  BUN 6* <5* <5* 5* <5*  CREATININE 0.98 0.82 0.95 0.92 1.01*  CALCIUM 8.2* 8.6* 8.4* 8.5* 8.8*  MG 1.6*  --  1.4*  --   --     Liver Function Tests: Recent Labs  Lab 08/27/18 0311  AST 21  ALT 9  ALKPHOS 36*  BILITOT 0.6  PROT 4.9*  ALBUMIN 1.7*   No results for input(s): LIPASE, AMYLASE in the last 168 hours. No results for input(s): AMMONIA in the last 168 hours. Coagulation Profile: No results for input(s): INR, PROTIME in the last 168 hours. Cardiac Enzymes: No results for input(s): CKTOTAL, CKMB, CKMBINDEX, TROPONINI in the last 168 hours. BNP (last 3 results) No results for input(s): PROBNP in the last 8760 hours. CBG: Recent Labs  Lab 09/01/18 0503 09/01/18 0509 09/01/18 0610 09/01/18 0703 09/01/18 0944  GLUCAP 56* 65* 56* 108* 103*   Studies: Dg Chest 2 View  Result Date: 09/01/2018 CLINICAL DATA:  Hypoxia EXAM: CHEST - 2 VIEW COMPARISON:  08/27/2018 FINDINGS: Moderate bilateral pleural effusions. Mild cardiomegaly. Left base atelectasis. No overt edema. Loop recorder device projects over the left lower chest. Prior left shoulder replacement. Advanced degenerative changes in the right shoulder. No acute bony abnormality. IMPRESSION: Moderate bilateral pleural effusions.  Left base  atelectasis. Mild cardiomegaly. Electronically Signed   By: Rolm Baptise M.D.   On: 09/01/2018 11:30   Time spent: 35 minutes  Domenic Polite, MD [facility] Page via [email or web address]  09/01/2018 1:58 PM  Between 7PM-7AM, please contact night-coverage at [email or web address], password [facility]

## 2018-09-01 NOTE — Progress Notes (Signed)
Hypoglycemic Event  CBG: 57  Treatment: Pt has no IV access, Glucose gel administered.  Symptoms: None  Follow-up CBG: Time: 0612 CBG Result: 56  Possible Reasons for Event: Pt fluids stopped due to loss of IV access  Comments/MD notified: On call NP Schorr    Cathy  Imonie Taylor

## 2018-09-01 NOTE — Progress Notes (Signed)
[facility] Gastroenterology Progress Note  Cathy Taylor 84 y.o. Feb 24, 1933  CC: Nausea, vomiting, esophageal and gastric ulcers.   Subjective: Patient seen and examined at bedside.  She does not feel like eating.  Discussed with the nursing staff.  Last bowel movement yesterday.  No reported nausea or vomiting.  Patient denies abdominal pain.    Objective: Vital signs in last 24 hours: Vitals:   08/31/18 1658 08/31/18 2338  BP: 121/67 (!) 155/101  Pulse: 67 68  Resp: 12 19  Temp: (!) 97.5 F (36.4 C) (!) 97.5 F (36.4 C)  SpO2: 100% 100%    Physical Exam:  General.  Alert/oriented x2.  Not in acute distress. Abdomen.  Soft, nontender, nondistended, bowel sounds present. LE : No significant edema Psych.  Somewhat depressed.  Lab Results: Recent Labs    08/30/18 0219 08/31/18 0303 09/01/18 0349  NA 136 136 137  K 5.1 4.5 4.5  CL 110 110 109  CO2 21* 20* 21*  GLUCOSE 118* 92 88  BUN <5* 5* <5*  CREATININE 0.95 0.92 1.01*  CALCIUM 8.4* 8.5* 8.8*  MG 1.4*  --   --    No results for input(s): AST, ALT, ALKPHOS, BILITOT, PROT, ALBUMIN in the last 72 hours. Recent Labs    08/31/18 0303 09/01/18 0349  WBC 7.0 9.1  HGB 8.3* 9.5*  HCT 25.2* 30.1*  MCV 77.8* 79.6*  PLT 246 248   No results for input(s): LABPROT, INR in the last 72 hours.    Assessment/Plan: - Multiple esophageal and gastric ulcers. - nausea and vomiting.  Resolved -Failure to thrive. -Drop in hemoglobin.  Hemoglobin stable now.  No overt bleeding  Recommendations ------------------------- -Advance diet to regular. - She does not feel like eating.Consider low-dose Remeron to stimulate appetite if clinically indicated. -Continue twice daily PPI at least for next 6 to 8 weeks and then once a day PPI. -Consider DC Reglan in few days.  -GI will follow  Otis Brace MD, [place] 09/01/2018, 8:15 AM  Contact #  [phone]

## 2018-09-01 NOTE — Progress Notes (Signed)
Pt's L AC PIV infiltrated and the L FA IV was leaking so both were removed. IV team consulted due to the pt being severely edematous in their BUE.   IV on unit and unable to obtain access despite the use of the [place].   On call NP Schorr notified. A secondary IV team nurse will come to the unit to attempt to get access.

## 2018-09-01 NOTE — Progress Notes (Signed)
Hypoglycemic Event  CBG: 56  Treatment: D 50 25 ml, IV fluids restarted  Symptoms: None Follow-up CBG: Time: 0700 CBG Result:108  Possible Reasons for Event: IV fluids stopped due to loss of IV access  Comments/MD notified: On Call NP Schorr   Cathy  [name]

## 2018-09-01 NOTE — Progress Notes (Signed)
Hypoglycemic Event  CBG: 27  Treatment: D50 69ml. Increased her dextrose infusion to 28ml/hr  Symptoms: N/A  Follow-up CBG: Time:1816 CBG Result:79  Possible Reasons for Event: Decrease of dextrose infusion to 58ml/hr  Comments/MD notified:Dr Glee Arvin

## 2018-09-02 LAB — BASIC METABOLIC PANEL
ANION GAP: 11 (ref 5–15)
BUN: 5 mg/dL — ABNORMAL LOW (ref 8–23)
CO2: 21 mmol/L — ABNORMAL LOW (ref 22–32)
Calcium: 8.7 mg/dL — ABNORMAL LOW (ref 8.9–10.3)
Chloride: 103 mmol/L (ref 98–111)
Creatinine, Ser: 0.95 mg/dL (ref 0.44–1.00)
GFR calc Af Amer: >60 mL/min (ref >60)
GFR calc non Af Amer: 55 mL/min — ABNORMAL LOW (ref >60)
Glucose, Bld: 134 mg/dL — ABNORMAL HIGH (ref 70–99)
Potassium: 3.7 mmol/L (ref 3.5–5.1)
Sodium: 135 mmol/L (ref 135–145)

## 2018-09-02 LAB — CBC
HCT: 27.4 % — ABNORMAL LOW (ref 36.0–46.0)
Hemoglobin: 9.1 g/dL — ABNORMAL LOW (ref 12.0–15.0)
MCH: 25.9 pg — ABNORMAL LOW (ref 26.0–34.0)
MCHC: 33.2 g/dL (ref 30.0–36.0)
MCV: 77.8 fL — ABNORMAL LOW (ref 80.0–100.0)
Platelets: 210 10*3/uL (ref 150–400)
RBC: 3.52 MIL/uL — AB (ref 3.87–5.11)
RDW: 21.8 % — ABNORMAL HIGH (ref 11.5–15.5)
WBC: 8.9 10*3/uL (ref 4.0–10.5)
nRBC: 0.8 % — ABNORMAL HIGH (ref 0.0–0.2)

## 2018-09-02 LAB — GLUCOSE, CAPILLARY
GLUCOSE-CAPILLARY: 159 mg/dL — AB (ref 70–99)
Glucose-Capillary: 108 mg/dL — ABNORMAL HIGH (ref 70–99)
Glucose-Capillary: 117 mg/dL — ABNORMAL HIGH (ref 70–99)

## 2018-09-02 MED ORDER — DEXTROSE 5 % IV SOLN
INTRAVENOUS | Status: DC
  Administered 2018-09-02 – 2018-09-03 (×2): via INTRAVENOUS

## 2018-09-02 MED ORDER — DRONABINOL 2.5 MG PO CAPS
2.5000 mg | ORAL_CAPSULE | Freq: Two times a day (BID) | ORAL | Status: DC
  Administered 2018-09-02 – 2018-09-06 (×6): 2.5 mg via ORAL
  Filled 2018-09-02 (×8): qty 1

## 2018-09-02 MED ORDER — FUROSEMIDE 10 MG/ML IJ SOLN
20.0000 mg | Freq: Two times a day (BID) | INTRAMUSCULAR | Status: AC
  Administered 2018-09-02 (×2): 20 mg via INTRAVENOUS
  Filled 2018-09-02 (×3): qty 2

## 2018-09-02 MED ORDER — POTASSIUM CHLORIDE CRYS ER 20 MEQ PO TBCR
40.0000 meq | EXTENDED_RELEASE_TABLET | Freq: Two times a day (BID) | ORAL | Status: AC
  Administered 2018-09-02: 40 meq via ORAL
  Filled 2018-09-02 (×2): qty 2

## 2018-09-02 MED ORDER — SUCRALFATE 1 GM/10ML PO SUSP
1.0000 g | Freq: Three times a day (TID) | ORAL | Status: DC
  Administered 2018-09-02 – 2018-09-07 (×16): 1 g via ORAL
  Filled 2018-09-02 (×20): qty 10

## 2018-09-02 NOTE — Progress Notes (Signed)
[facility] Gastroenterology Progress Note  Cathy Taylor 84 y.o. [birth year]  CC: Nausea, vomiting, esophageal and gastric ulcers.   Subjective: Patient seen and examined at bedside.  She continues to have poor oral intake.  She denies abdominal pain, nausea vomiting.  Discussed with the nursing staff.  She is drinking few sips of Ensure but otherwise her appetite remains poor..    Objective: Vital signs in last 24 hours: Vitals:   09/02/18 0044 09/02/18 0743  BP:  117/67  Pulse: (!) 50 69  Resp:    Temp:  (!) 97.4 F (36.3 C)  SpO2: 100% (!) 78%    Physical Exam:  General.  Alert/oriented x2.  Not in acute distress. Abdomen.  Soft, nontender, nondistended, bowel sounds present. LE : No significant edema Psych.  Somewhat depressed.  Lab Results: Recent Labs    09/01/18 0349 09/02/18 0309  NA 137 135  K 4.5 3.7  CL 109 103  CO2 21* 21*  GLUCOSE 88 134*  BUN <5* 5*  CREATININE 1.01* 0.95  CALCIUM 8.8* 8.7*   No results for input(s): AST, ALT, ALKPHOS, BILITOT, PROT, ALBUMIN in the last 72 hours. Recent Labs    09/01/18 0349 09/02/18 0309  WBC 9.1 8.9  HGB 9.5* 9.1*  HCT 30.1* 27.4*  MCV 79.6* 77.8*  PLT 248 210   No results for input(s): LABPROT, INR in the last 72 hours.    Assessment/Plan: - Failure to thrive with very poor oral intake. - Multiple esophageal and gastric ulcers. - nausea and vomiting.  Resolved -Drop in hemoglobin.  Hemoglobin stable now.  No overt bleeding  Recommendations ------------------------- -Start Carafate 3 times a day.  Continue to encourage for oral intake.  Could bring food from outside if she is desiring some other foods. -She does not feel like eating, and long-term nutritional goals needs to discussed with the  family. -Continue twice daily PPI at least for next 6 to 8 weeks and then once a day PPI.  -GI will follow  Otis Brace MD, [place] 09/02/2018, 8:44 AM  Contact #  [phone]

## 2018-09-02 NOTE — Progress Notes (Addendum)
[facility] Progress Note  Patient: Cathy Taylor BJY:[id number]   PCP: Maryella Shivers, MD DOB: 14-Apr-1933   DOA: 08/24/2018   DOS: 09/02/2018   Date of Service: the patient was seen and examined on 09/02/2018  Brief hospital course: Pt. with PMH of dementia, CVA, chronic kidney disease stage III, type 2 diabetes, abdominal hernia, anxiety, depression, GERD, hypertension, hyperlipidemia admitted on 08/24/2018, with complaint of nausea vomiting and weight loss, was found to have gastritis and duodenitis.  GI was consulted and patient underwent EGD, 08/26/2018 which showed, multiple esophageal ulcer with acute gastritis.  Patient was placed on IV Protonix drip and now transitioned to oral.   ONGOING Failure to thrive, Po intake is very poor  Subjective:  -Oral intake remains minimal to none, breathing ok  Assessment and Plan:  Esophageal/gastric ulcers, gastritis and duodenitis -Admitted with ongoing failure to thrive, nausea vomiting, weight loss -Gastroenterology consulted, underwent EGD which showed severe gastritis gastric ulcers, esophageal ulcer and duodenitis -now on Oral protonix, carafate added, Po intake very poor  Adult failure to thrive -multifactorial, primarily due to above and dementia, CVA, depression also affecting appetite -Oral intake is minimal at this time, also persistent hypoglycemia secondary to this -GI added carafate -started Remeron and will add Marinol -will d/w family, if this doesn't change prognosis will be very poor -She had a CT abdomen pelvis last admission, was concerning for possible cecal mass but this was a noncontrast CT and sequently had a contrasted CT abdomen pelvis done 12/3 did not note this, Dr. Kathline Magic note 12/4 would not recommend colonoscopy due to advanced age and comorbidities with a negative contrasted CT -d/w daughter regarding need to consider Palliative care if decline continues  Acute hypoxic respiratory failure -Clinically  appears volume overloaded, CXR with bilateral pleural effusions -Continue IV Lasix, cutdown fluids changed to D5 water for hypoglycemia -Wean O2 -Last echo 04/2017 with preserved EF and grade 1 diastolic dysfunction  Acute on chronic diastolic CHF -Diuretics as above, iatrogenic volume overload secondary to fluid resuscitation and recurrent hypoglycemia requiring continuous dextrose infusion  Mild leukocytosis -Reactive, resolved  Anemia due to chronic blood loss -As above, no overt bleeding at this time -Hemoglobin stable, monitor  Type 2 diabetes mellitus, uncontrolled with hypoglycemia -Was persistently hypoglycemic in the setting of poor p.o. intake -IV fluids to D5 water at 30 cc an hour -Cosyntropin stim test was negative few weeks ago  Chronic kidney disease stage II -Creatinine was 1.4 on admission -Improved with hydration, lisinopril on hold we will restart this in 1 to 2 days -Monitor kidney function with diuresis  Bilateral heel pressure sores -Wound care  Anxiety, depression Holding home mirtazapine, Lexapro due to lethargy and sleepiness  History of CVA -She was on 81 mg of aspirin at baseline this was held secondary to severe gastritis, recommend resumption in 1 to 2 weeks  Dementia -restart Aricept  HTN -Hold home lisinopril  Blood pressure rather soft.  Will monitor.  Hyperlipidemia -Continue home fenofibrate  Malnutrition Type: Nutrition Problem: Severe Malnutrition Etiology: acute illness(severe duodenitis)  Diet: soft diet DVT Prophylaxis: mechanical compression device  Advance goals of care discussion: DNR DNI  Family Communication: No family at bedside  Disposition: SNF, pending improvement in hypoxia, diuresis and p.o. intake  Consultants: gastroenterology  Procedures: EGD  Scheduled Meds: . collagenase   Topical Daily  . donepezil  10 mg Oral QHS  . dronabinol  2.5 mg Oral BID AC  . feeding supplement (ENSURE ENLIVE)  237 mL  Oral BID BM  . furosemide  20 mg Intravenous BID  . mirtazapine  7.5 mg Oral QHS  . pantoprazole  40 mg Oral BID AC  . potassium chloride  40 mEq Oral BID  . povidone-iodine   Topical BID  . sucralfate  1 g Oral TID WC & HS   Continuous Infusions: . dextrose 50 mL/hr at 09/02/18 1019   PRN Meds: acetaminophen **OR** acetaminophen, HYDROcodone-acetaminophen, ondansetron (ZOFRAN) IV Antibiotics: Anti-infectives (From admission, onward)   None       Objective: Physical Exam: Vitals:   09/02/18 0020 09/02/18 0044 09/02/18 0743 09/02/18 0909  BP: 136/73  117/67   Pulse: 85 (!) 50 69 73  Resp: 19     Temp: (!) 97.5 F (36.4 C)  (!) 97.4 F (36.3 C)   TempSrc: Oral  Oral   SpO2:  100% (!) 78% 100%  Weight:      Height:        Intake/Output Summary (Last 24 hours) at 09/02/2018 1132 Last data filed at 09/02/2018 0900 Gross per 24 hour  Intake 891.98 ml  Output 600 ml  Net 291.98 ml   Filed Weights   08/26/18 0727  Weight: 57.6 kg   Gen: Elderly, chronically ill-appearing female, alert, awake, oriented to self and place, no distress HEENT: PERRLA, Neck supple, no JVD Lungs: Decreased breath sounds at both bases CVS: RRR,No Gallops,Rubs or new Murmurs Abd: soft, Non tender, non distended, BS present Extremities: 1+ edema worse in the right upper extremity Skin: no new rashes  Data Reviewed: CBC: Recent Labs  Lab 08/27/18 0311 08/28/18 0838 08/30/18 0219 08/31/18 0303 09/01/18 0349 09/02/18 0309  WBC 7.1 7.7 8.6 7.0 9.1 8.9  NEUTROABS 3.2  --   --   --   --   --   HGB 8.0* 9.4* 9.2* 8.3* 9.5* 9.1*  HCT 25.9* 29.9* 29.9* 25.2* 30.1* 27.4*  MCV 79.7* 81.3 79.7* 77.8* 79.6* 77.8*  PLT 334 344 314 246 248 297   Basic Metabolic Panel: Recent Labs  Lab 08/27/18 0311 08/28/18 0838 08/30/18 0219 08/31/18 0303 09/01/18 0349 09/02/18 0309  NA 142 138 136 136 137 135  K 3.1* 4.5 5.1 4.5 4.5 3.7  CL 111 111 110 110 109 103  CO2 24 19* 21* 20* 21* 21*    GLUCOSE 107* 131* 118* 92 88 134*  BUN 6* <5* <5* 5* <5* 5*  CREATININE 0.98 0.82 0.95 0.92 1.01* 0.95  CALCIUM 8.2* 8.6* 8.4* 8.5* 8.8* 8.7*  MG 1.6*  --  1.4*  --   --   --     Liver Function Tests: Recent Labs  Lab 08/27/18 0311  AST 21  ALT 9  ALKPHOS 36*  BILITOT 0.6  PROT 4.9*  ALBUMIN 1.7*   No results for input(s): LIPASE, AMYLASE in the last 168 hours. No results for input(s): AMMONIA in the last 168 hours. Coagulation Profile: No results for input(s): INR, PROTIME in the last 168 hours. Cardiac Enzymes: No results for input(s): CKTOTAL, CKMB, CKMBINDEX, TROPONINI in the last 168 hours. BNP (last 3 results) No results for input(s): PROBNP in the last 8760 hours. CBG: Recent Labs  Lab 09/01/18 1749 09/01/18 1750 09/01/18 1816 09/02/18 0617 09/02/18 0745  GLUCAP 27* <10* 79 108* 117*   Studies: No results found. Time spent: 35 minutes  Domenic Polite, MD [facility] Page via [email or web address]  09/02/2018 11:32 AM  Between 7PM-7AM, please contact night-coverage at [email or web address], password [facility]

## 2018-09-02 NOTE — Progress Notes (Signed)
Pt. refusing hs meds/pills, including KCL pills; Schorr,NP informed.

## 2018-09-03 DIAGNOSIS — F0280 Dementia in other diseases classified elsewhere without behavioral disturbance: Secondary | ICD-10-CM

## 2018-09-03 DIAGNOSIS — G301 Alzheimer's disease with late onset: Secondary | ICD-10-CM

## 2018-09-03 LAB — BASIC METABOLIC PANEL
Anion gap: 12 (ref 5–15)
BUN: 7 mg/dL — AB (ref 8–23)
CO2: 22 mmol/L (ref 22–32)
Calcium: 8.8 mg/dL — ABNORMAL LOW (ref 8.9–10.3)
Chloride: 99 mmol/L (ref 98–111)
Creatinine, Ser: 1.02 mg/dL — ABNORMAL HIGH (ref 0.44–1.00)
GFR calc Af Amer: 59 mL/min — ABNORMAL LOW (ref >60)
GFR calc non Af Amer: 51 mL/min — ABNORMAL LOW (ref >60)
Glucose, Bld: 127 mg/dL — ABNORMAL HIGH (ref 70–99)
Potassium: 3.9 mmol/L (ref 3.5–5.1)
Sodium: 133 mmol/L — ABNORMAL LOW (ref 135–145)

## 2018-09-03 LAB — CBC
HCT: 27.3 % — ABNORMAL LOW (ref 36.0–46.0)
Hemoglobin: 8.8 g/dL — ABNORMAL LOW (ref 12.0–15.0)
MCH: 24.7 pg — ABNORMAL LOW (ref 26.0–34.0)
MCHC: 32.2 g/dL (ref 30.0–36.0)
MCV: 76.7 fL — ABNORMAL LOW (ref 80.0–100.0)
Platelets: 172 10*3/uL (ref 150–400)
RBC: 3.56 MIL/uL — ABNORMAL LOW (ref 3.87–5.11)
RDW: 21.2 % — ABNORMAL HIGH (ref 11.5–15.5)
WBC: 8.5 10*3/uL (ref 4.0–10.5)
nRBC: 1.2 % — ABNORMAL HIGH (ref 0.0–0.2)

## 2018-09-03 LAB — GLUCOSE, CAPILLARY
GLUCOSE-CAPILLARY: 101 mg/dL — AB (ref 70–99)
Glucose-Capillary: 93 mg/dL (ref 70–99)

## 2018-09-03 NOTE — Plan of Care (Signed)
  Problem: Education: Goal: Knowledge of General Education information will improve Description Including pain rating scale, medication(s)/side effects and non-pharmacologic comfort measures Outcome: Not Progressing Note:  POC reviewed with pt.; pt. lethargic and refusing meds; seems withdrawn.

## 2018-09-03 NOTE — Progress Notes (Signed)
[facility] Gastroenterology Progress Note  Cathy Taylor 84 y.o. 1932/12/13  CC: Nausea, vomiting, esophageal and gastric ulcers.   Subjective: Patient seen and examined at bedside.  She is now refusing to take her meds as well.  She continues to have poor oral intake.  She denies abdominal pain, nausea vomiting.  Discussed with the nursing staff.      Objective: Vital signs in last 24 hours: Vitals:   09/03/18 0005 09/03/18 0738  BP:  (!) 147/75  Pulse:  73  Resp:    Temp:  (!) 97.2 F (36.2 C)  SpO2: 100% 100%    Physical Exam:  General.  Resting comfortably in the bed, not in acute distress. Abdomen.  Soft, nontender, nondistended, bowel sounds present. LE : No significant edema Psych.  Somewhat depressed.  Lab Results: Recent Labs    09/02/18 0309 09/03/18 0316  NA 135 133*  K 3.7 3.9  CL 103 99  CO2 21* 22  GLUCOSE 134* 127*  BUN 5* 7*  CREATININE 0.95 1.02*  CALCIUM 8.7* 8.8*   No results for input(s): AST, ALT, ALKPHOS, BILITOT, PROT, ALBUMIN in the last 72 hours. Recent Labs    09/02/18 0309 09/03/18 0316  WBC 8.9 8.5  HGB 9.1* 8.8*  HCT 27.4* 27.3*  MCV 77.8* 76.7*  PLT 210 172   No results for input(s): LABPROT, INR in the last 72 hours.    Assessment/Plan: - Failure to thrive with very poor oral intake.  Multifactorial - Multiple esophageal and gastric ulcers. - nausea and vomiting.  Resolved -Drop in hemoglobin.  Hemoglobin stable now.  No overt bleeding  Recommendations ------------------------- -Patient is now refusing to take her meds as well.  CT abdomen pelvis with contrast earlier this month was negative for any acute changes. -Primary team is planning to discuss goal of her care with the family. -Not much to add from GI standpoint.  GI will sign off.  Call us back if needed.  Otis Brace MD, [place] 09/03/2018, 8:32 AM  Contact #  [phone]

## 2018-09-03 NOTE — Progress Notes (Signed)
Physical Therapy Treatment Patient Details Name: Cathy Taylor MRN: [id number] DOB: 23-Sep-1932 Today's Date: 09/03/2018    History of Present Illness Pt is an 84 y/o female admitted secondary to failure to thrive and emesis. CT of abdomen revealed small layering dependent gallstone within gallbladder. Per notes, pt likely for EGD on 12/5. PMH includes dementia, HTN, DM, CVA, L TSA, CKD, and R breast cancer.     PT Comments    Pt was seen to attempt transfer but declined OOB and agreed to there ex.  Pt is with her family who are interested and supportive of all her efforts.  Follow acutely for as much movement as pt can tolerate.  Per nsg pt has refused several meds in the last 24 hours, and will continue to see with medical approval.   Follow Up Recommendations  SNF;Supervision/Assistance - 24 hour     Equipment Recommendations  None recommended by PT    Recommendations for Other Services       Precautions / Restrictions Precautions Precautions: Fall Restrictions Weight Bearing Restrictions: No    Mobility  Bed Mobility Overal bed mobility: Needs Assistance             General bed mobility comments: repositioned with head of bed to support posture  Transfers                 General transfer comment: declined to get OOB  Ambulation/Gait                 Stairs             Wheelchair Mobility    Modified Rankin (Stroke Patients Only)       Balance                                            Cognition Arousal/Alertness: Awake/alert Behavior During Therapy: Flat affect Overall Cognitive Status: History of cognitive impairments - at baseline                                 General Comments: awake and conversative at time      Exercises General Exercises - Lower Extremity Ankle Circles/Pumps: AAROM;Both;5 reps Quad Sets: AROM;Both;5 reps Heel Slides: AAROM;Both;10 reps Hip ABduction/ADduction:  AAROM;Both;10 reps Straight Leg Raises: AAROM;Both;10 reps    General Comments        Pertinent Vitals/Pain Pain Assessment: Faces Faces Pain Scale: Hurts even more Pain Location: R foot neuropathy Pain Descriptors / Indicators: Burning;Accord                      Prior Function            PT Goals (current goals can now be found in the care plan section) Acute Rehab PT Goals Patient Stated Goal: to walk and move with help Progress towards PT goals: Progressing toward goals    Frequency    Min 2X/week      PT Plan Current plan remains appropriate    Co-evaluation              AM-PAC PT "6 Clicks" Mobility   Outcome Measure  Help needed turning from your back to your side while in a flat bed without using bedrails?: A Lot Help needed moving from lying  on your back to sitting on the side of a flat bed without using bedrails?: A Lot Help needed moving to and from a bed to a chair (including a wheelchair)?: Total Help needed standing up from a chair using your arms (e.g., wheelchair or bedside chair)?: Total Help needed to walk in hospital room?: Total Help needed climbing 3-5 steps with a railing? : Total 6 Click Score: 8    End of Session   Activity Tolerance: Patient tolerated treatment well Patient left: with call bell/phone within reach;in chair;with chair alarm set Nurse Communication: Mobility status PT Visit Diagnosis: Unsteadiness on feet (R26.81);Muscle weakness (generalized) (M62.81);Difficulty in walking, not elsewhere classified (R26.2)     Time: 5726-2035 PT Time Calculation (min) (ACUTE ONLY): 25 min  Charges:  $Therapeutic Exercise: 23-37 mins                     Ramond Dial 09/03/2018, 6:37 PM   Mee Hives, PT MS Acute Rehab Dept. Number: [place]

## 2018-09-03 NOTE — Progress Notes (Signed)
[facility] Progress Note  Patient: Cathy Taylor:[id number]   PCP: Maryella Shivers, MD DOB: November 29, 1932   DOA: 08/24/2018   DOS: 09/03/2018   Date of Service: the patient was seen and examined on 09/03/2018  Brief hospital course: Pt. with PMH of dementia, CVA, chronic kidney disease stage III, type 2 diabetes, abdominal hernia, anxiety, depression, GERD, hypertension, hyperlipidemia multiple frequent hospitalizations admitted on 08/24/2018, with complaint of nausea vomiting and weight loss,  GI was consulted and patient underwent EGD, 08/26/2018 which showed, multiple esophageal ulcer with acute gastritis.  Patient was placed on IV Protonix drip and now transitioned to oral.   ONGOING Failure to thrive, Po intake is very poor -Discussed poor prognosis with daughter -12/13 palliative medicine consulted for goals of care  Subjective:  -P.o. intake is none at this time, also declining medications most of the time  Assessment and Plan:  Esophageal/gastric ulcers, gastritis and duodenitis -Admitted with ongoing failure to thrive, nausea vomiting, weight loss -Gastroenterology consulted, underwent EGD which showed severe gastritis gastric ulcers, esophageal ulcer and duodenitis -now on Oral protonix, carafate added, Po intake very poor  Adult failure to thrive -multifactorial, primarily due to above and dementia, CVA, depression also affecting appetite -Oral intake is pretty much none at this time, also persistent hypoglycemia secondary to this -Continue Protonix, GI added carafate -We also started Remeron and I added Marinol yesterday  -I also discussed with daughter at bedside regarding very poor prognosis if this does not change  -She had a CT abdomen pelvis last admission, was concerning for possible cecal mass but this was a noncontrast CT and sequently had a contrasted CT abdomen pelvis done 12/3 which did not note any cecal abnormalities, Dr. Kathline Magic note 12/4 would not  recommend colonoscopy due to advanced age and comorbidities with a negative contrasted CT -Consult palliative medicine for goals of care  Acute hypoxic respiratory failure -Clinically appears volume overloaded, CXR with bilateral pleural effusions -Recent with IV Lasix, appears close to euvolemic now, hold Lasix, cutdown fluids to 30 cc an hour of D5 water for hypoglycemia  -Wean O2 -Last echo 04/2017 with preserved EF and grade 1 diastolic dysfunction  Acute on chronic diastolic CHF -See above, diuretics held now  Mild leukocytosis -Reactive, resolved  Anemia due to chronic blood loss -As above, no overt bleeding at this time -Hemoglobin stable, monitor  Type 2 diabetes mellitus, uncontrolled with hypoglycemia -Was persistently hypoglycemic in the setting of poor p.o. intake -IV fluids to D5 water at 30 cc an hour -Cosyntropin stim test was negative few weeks ago  Chronic kidney disease stage II -Creatinine was 1.4 on admission -Improved with hydration, lisinopril on hold  -Monitor kidney function with diuresis  Bilateral heel pressure sores -Wound care  Anxiety, depression Holding home mirtazapine, Lexapro due to lethargy and sleepiness  History of CVA -She was on 81 mg of aspirin at baseline this was held secondary to severe gastritis, recommend resumption in 1 to 2 weeks  Dementia -restarted Aricept  HTN -Hold home lisinopril  Blood pressure rather soft.  Will monitor.  Hyperlipidemia -Continue home fenofibrate  Malnutrition Type: Nutrition Problem: Severe Malnutrition Etiology: acute illness(severe duodenitis)  Diet: soft diet DVT Prophylaxis: mechanical compression device  Advance goals of care discussion: DNR DNI  Family Communication: No family at bedside, discussed with daughter yesterday  Disposition: Be determined  Consultants: gastroenterology  Procedures: EGD  Scheduled Meds: . collagenase   Topical Daily  . donepezil  10 mg Oral QHS   .  dronabinol  2.5 mg Oral BID AC  . feeding supplement (ENSURE ENLIVE)  237 mL Oral BID BM  . mirtazapine  7.5 mg Oral QHS  . pantoprazole  40 mg Oral BID AC  . povidone-iodine   Topical BID  . sucralfate  1 g Oral TID WC & HS   Continuous Infusions: . dextrose 30 mL/hr at 09/02/18 1218   PRN Meds: acetaminophen **OR** acetaminophen, HYDROcodone-acetaminophen, ondansetron (ZOFRAN) IV Antibiotics: Anti-infectives (From admission, onward)   None       Objective: Physical Exam: Vitals:   09/02/18 1642 09/03/18 0000 09/03/18 0005 09/03/18 0738  BP: 121/82 (!) 141/74  (!) 147/75  Pulse: 66 73  73  Resp:  17    Temp: (!) 97.5 F (36.4 C) (!) 97.3 F (36.3 C)  (!) 97.2 F (36.2 C)  TempSrc: Oral Oral  Oral  SpO2: 100%  100% 100%  Weight:      Height:        Intake/Output Summary (Last 24 hours) at 09/03/2018 1341 Last data filed at 09/03/2018 0715 Gross per 24 hour  Intake 497.88 ml  Output 2750 ml  Net -2252.12 ml   Filed Weights   08/26/18 0727  Weight: 57.6 kg   Gen: Elderly frail, chronically ill-appearing female alert awake oriented to self place, no distress HEENT: PERRLA, Neck supple, no JVD Lungs: Decreased breath sounds at both bases CVS: RRR,No Gallops,Rubs or new Murmurs Abd: soft, Non tender, non distended, BS present Extremities: trace edema  skin: no new rashes   Data Reviewed: CBC: Recent Labs  Lab 08/30/18 0219 08/31/18 0303 09/01/18 0349 09/02/18 0309 09/03/18 0316  WBC 8.6 7.0 9.1 8.9 8.5  HGB 9.2* 8.3* 9.5* 9.1* 8.8*  HCT 29.9* 25.2* 30.1* 27.4* 27.3*  MCV 79.7* 77.8* 79.6* 77.8* 76.7*  PLT 314 246 248 210 500   Basic Metabolic Panel: Recent Labs  Lab 08/30/18 0219 08/31/18 0303 09/01/18 0349 09/02/18 0309 09/03/18 0316  NA 136 136 137 135 133*  K 5.1 4.5 4.5 3.7 3.9  CL 110 110 109 103 99  CO2 21* 20* 21* 21* 22  GLUCOSE 118* 92 88 134* 127*  BUN <5* 5* <5* 5* 7*  CREATININE 0.95 0.92 1.01* 0.95 1.02*  CALCIUM 8.4*  8.5* 8.8* 8.7* 8.8*  MG 1.4*  --   --   --   --     Liver Function Tests: No results for input(s): AST, ALT, ALKPHOS, BILITOT, PROT, ALBUMIN in the last 168 hours. No results for input(s): LIPASE, AMYLASE in the last 168 hours. No results for input(s): AMMONIA in the last 168 hours. Coagulation Profile: No results for input(s): INR, PROTIME in the last 168 hours. Cardiac Enzymes: No results for input(s): CKTOTAL, CKMB, CKMBINDEX, TROPONINI in the last 168 hours. BNP (last 3 results) No results for input(s): PROBNP in the last 8760 hours. CBG: Recent Labs  Lab 09/02/18 0617 09/02/18 0745 09/02/18 1406 09/03/18 0620 09/03/18 0740  GLUCAP 108* 117* 159* 93 101*   Studies: No results found. Time spent: 25 minutes  Domenic Polite, MD [facility] Page via [email or web address]  09/03/2018 1:41 PM  Between 7PM-7AM, please contact night-coverage at [email or web address], password [facility]

## 2018-09-03 NOTE — Care Management Important Message (Signed)
Important Message  Patient Details  Name: Cathy Taylor MRN: [id number] Date of Birth: 18-Oct-1932   Medicare Important Message Given:  Yes    [name] 09/03/2018, 4:10 PM

## 2018-09-04 LAB — GLUCOSE, CAPILLARY
GLUCOSE-CAPILLARY: 61 mg/dL — AB (ref 70–99)
GLUCOSE-CAPILLARY: 74 mg/dL (ref 70–99)
Glucose-Capillary: 78 mg/dL (ref 70–99)
Glucose-Capillary: 52 mg/dL — ABNORMAL LOW (ref 70–99)
Glucose-Capillary: 92 mg/dL (ref 70–99)

## 2018-09-04 MED ORDER — DEXTROSE 5 % IV SOLN: INTRAVENOUS | Status: DC

## 2018-09-04 MED ORDER — POTASSIUM CHLORIDE CRYS ER 20 MEQ PO TBCR
40.0000 meq | EXTENDED_RELEASE_TABLET | Freq: Once | ORAL | Status: DC
  Filled 2018-09-04: qty 2

## 2018-09-04 MED ORDER — FUROSEMIDE 10 MG/ML IJ SOLN
40.0000 mg | Freq: Once | INTRAMUSCULAR | Status: DC
  Filled 2018-09-04: qty 4

## 2018-09-04 NOTE — Progress Notes (Signed)
Pt remains lethargic, further from status at shift change. Pt's daughter called, requesting an update. Daughter was updated on shift in pt alertness and informed we are unable to get her to eat and not able to give oral medications due to the level of lethargy, and risk of aspiration.    Pt's daughter states she will come visit; that's why the pt is 'out of it'. Daughter informed of pt's higher level of alertness at shift change this morning; even with a lower CBG and increasing lethargy with normal range CBGs.

## 2018-09-04 NOTE — Progress Notes (Signed)
Dr Broadus John at bedside, agrees pt does appear more lethargic. Per MD will hold medications this am, including lasix.   marinol wasted with CN.

## 2018-09-04 NOTE — Progress Notes (Signed)
Reviewed chart.  Spoke with Dr. Broadus John.  Patient with recurrent hypoglycemia and significant FTT (albumin 1.7).  Is requiring D5 to maintain appropriate blood glucoses.  I spoke with daughter Cathy Taylor (who works for [employer]) and after introducing myself she immediately said "Oh we're not ready for that.  We will put a feeding tube in the ulcers are healing.  We are not giving up so momma is not giving up".  I explained that a feeding tube is likely not a good idea due to significant ulcerations in her stomach and esophagus - it would be difficult to place and heal afterward.    I asked Cathy Taylor if we could meet just to share information even if we did not make any decisions.   Cathy Taylor referred me to her sister Cathy Taylor (who works for cardio-thoracic surgery).   I asked Cathy Taylor if we could meet to share information.  Cathy Taylor said we don't need that right now - I'll take your name and number and we will call you if needed.   Unfortunately family unwilling to meet with Palliative to even understand/discuss in more detail the patient's medical condition.  Complicated difficult situation.  Palliative will remain available to the medical team and family if re-consulted.  Florentina Jenny, PA-C Palliative Medicine Pager: [phone]  No charge note.

## 2018-09-04 NOTE — Progress Notes (Addendum)
Note left for care team.  Pt on D5, Hx DM, No order for CBG.  Pt also with restricted RUE, and hx of infiltration in LAC/L ARM. Utilizing LUE at this time; though RUE access not removed yet. Pending momentary monitoring of LUE tolerance of infusion.

## 2018-09-04 NOTE — Progress Notes (Signed)
More family arrives at bedside, denies any questions at this time. Awaiting dinner, hoping for continued alertness for possible PO intake. Will remind family of dangers of forcing PO intake.

## 2018-09-04 NOTE — Progress Notes (Signed)
Patient resting comfortably during shift report. Denies complaints.  

## 2018-09-04 NOTE — Progress Notes (Signed)
Patient's D5 bumped to 75. Order entered as verbal to correlate, per Dr Broadus John.   Family at bedside. Assisted pt to take protonix pill in applesauce, offered extra applesauce, pt verbally agreed with encouragement from family; but physically blocked her lips with her tongue with each attempt.

## 2018-09-04 NOTE — Progress Notes (Signed)
[facility] Progress Note  Patient: Cathy Taylor ZJI:[id number]   PCP: Maryella Shivers, MD DOB: 1932-10-23   DOA: 08/24/2018   DOS: 09/04/2018   Date of Service: the patient was seen and examined on 09/04/2018  Brief hospital course: Pt. with PMH of dementia, CVA, chronic kidney disease stage III, type 2 diabetes, abdominal hernia, anxiety, depression, GERD, hypertension, hyperlipidemia multiple frequent hospitalizations admitted on 08/24/2018, with complaint of nausea vomiting and weight loss,  GI was consulted and patient underwent EGD, 08/26/2018 which showed, multiple esophageal ulcer with acute gastritis.  Patient was placed on IV Protonix drip and now transitioned to oral.   -Ongoing Failure to thrive, Po intake is very poor -Discussed poor prognosis with daughter -12/13 palliative medicine consulted for goals of care -No family at bedside, palliative RN attempted to contact family but they declined such discussions  Subjective:  -P.o. intake is none at this time, also declining medications most of the time  Assessment and Plan:  Esophageal/gastric ulcers, gastritis and duodenitis -Admitted with ongoing failure to thrive, nausea vomiting, weight loss -Gastroenterology consulted, underwent EGD which showed severe gastritis,  gastric ulcers, esophageal ulcer and duodenitis -now on Oral protonix, carafate added, Po intake very poor -GI has signed off, nothing further to add from gastroenterology standpoint -Patient refuses to eat or drink anything and not taking medications also for the most part -see below  Adult failure to thrive -multifactorial, primarily due to above and dementia, CVA, depression also affecting appetite -Oral intake is pretty much none at this time, also persistent hypoglycemia secondary to this -Continue Protonix, and Carafate  -I also started her on Remeron and Marinol  -Unfortunately continues to refuse all types of p.o. intake including medications at  this time, I had discussed poor prognosis with daughter Jeannene Patella 2 days ago, her albumin is 1.7 -Palliative medicine was consulted for goals of care  -Unfortunately daughters declined meeting with palliative medicine  -No family at bedside today with will attempt to contact daughters again soon, PEG tube would not be an option under such circumstances -she is DNR  Acute hypoxic respiratory failure -CXR with bilateral pleural effusions -Diuresed with IV Lasix for couple of days, close to euvolemic now we will hold Lasix today -Remains on D5 water at 30 cc an hour for persistent hypoglycemia -Wean O2 -Last echo 04/2017 with preserved EF and grade 1 diastolic dysfunction  Acute on chronic diastolic CHF -See above, diuretics held now  Mild leukocytosis -Reactive, resolved  Anemia due to chronic blood loss -As above, no overt bleeding at this time -Hemoglobin stable, monitor  Type 2 diabetes mellitus, uncontrolled with hypoglycemia -Was persistently hypoglycemic in the setting of poor p.o. intake -Continue D5 water at 30 cc an hour -Cosyntropin stim test was negative few weeks ago  Chronic kidney disease stage II -Creatinine was 1.4 on admission -Improved with hydration, lisinopril on hold  -Monitor kidney function with diuresis  Bilateral heel pressure sores -Wound care  Anxiety, depression Holding home mirtazapine, Lexapro due to lethargy and sleepiness  History of CVA -She was on 81 mg of aspirin at baseline this was held secondary to severe gastritis, recommend resumption in 1 to 2 weeks  Dementia -restarted Aricept  HTN -Hold home lisinopril  Blood pressure rather soft.  Will monitor.  Hyperlipidemia -Continue home fenofibrate  Malnutrition Type: Nutrition Problem: Severe Malnutrition Etiology: acute illness(severe duodenitis)  Diet: soft diet DVT Prophylaxis: mechanical compression device  Advance goals of care discussion: DNR DNI  Family Communication:  No  family at bedside, discussed with daughter 12/12 Reattempt to contact daughter again  Disposition: to be determined  Consultants: gastroenterology  Procedures: EGD  Scheduled Meds: . collagenase   Topical Daily  . donepezil  10 mg Oral QHS  . dronabinol  2.5 mg Oral BID AC  . feeding supplement (ENSURE ENLIVE)  237 mL Oral BID BM  . furosemide  40 mg Intravenous Once  . mirtazapine  7.5 mg Oral QHS  . pantoprazole  40 mg Oral BID AC  . potassium chloride  40 mEq Oral Once  . povidone-iodine   Topical BID  . sucralfate  1 g Oral TID WC & HS   Continuous Infusions: . dextrose 30 mL/hr at 09/03/18 1847   PRN Meds: acetaminophen **OR** acetaminophen, HYDROcodone-acetaminophen, ondansetron (ZOFRAN) IV Antibiotics: Anti-infectives (From admission, onward)   None       Objective: Physical Exam: Vitals:   09/03/18 1656 09/03/18 1736 09/04/18 0752 09/04/18 1131  BP: 120/64  118/64 (!) 133/37  Pulse: 68 74 68   Resp:   18   Temp: 97.6 F (36.4 C)  97.9 F (36.6 C)   TempSrc: Oral  Oral   SpO2:  97% 94%   Weight:      Height:        Intake/Output Summary (Last 24 hours) at 09/04/2018 1343 Last data filed at 09/04/2018 0739 Gross per 24 hour  Intake 727.61 ml  Output 700 ml  Net 27.61 ml   Filed Weights   08/26/18 0727  Weight: 57.6 kg   Gen: Elderly frail chronically ill-appearing female was more awake and alert in the morning but became somnolent in the afternoon HEENT: PERRLA, Neck supple, no JVD Lungs: Decreased breath sounds at both bases CVS: RRR,No Gallops,Rubs or new Murmurs Abd: soft, Non tender, non distended, BS present Extremities: Left upper extremity with 2+ edema Skin: no new rashes   Data Reviewed: CBC: Recent Labs  Lab 08/30/18 0219 08/31/18 0303 09/01/18 0349 09/02/18 0309 09/03/18 0316  WBC 8.6 7.0 9.1 8.9 8.5  HGB 9.2* 8.3* 9.5* 9.1* 8.8*  HCT 29.9* 25.2* 30.1* 27.4* 27.3*  MCV 79.7* 77.8* 79.6* 77.8* 76.7*  PLT 314 246 248 210  818   Basic Metabolic Panel: Recent Labs  Lab 08/30/18 0219 08/31/18 0303 09/01/18 0349 09/02/18 0309 09/03/18 0316  NA 136 136 137 135 133*  K 5.1 4.5 4.5 3.7 3.9  CL 110 110 109 103 99  CO2 21* 20* 21* 21* 22  GLUCOSE 118* 92 88 134* 127*  BUN <5* 5* <5* 5* 7*  CREATININE 0.95 0.92 1.01* 0.95 1.02*  CALCIUM 8.4* 8.5* 8.8* 8.7* 8.8*  MG 1.4*  --   --   --   --     Liver Function Tests: No results for input(s): AST, ALT, ALKPHOS, BILITOT, PROT, ALBUMIN in the last 168 hours. No results for input(s): LIPASE, AMYLASE in the last 168 hours. No results for input(s): AMMONIA in the last 168 hours. Coagulation Profile: No results for input(s): INR, PROTIME in the last 168 hours. Cardiac Enzymes: No results for input(s): CKTOTAL, CKMB, CKMBINDEX, TROPONINI in the last 168 hours. BNP (last 3 results) No results for input(s): PROBNP in the last 8760 hours. CBG: Recent Labs  Lab 09/03/18 0620 09/03/18 0740 09/04/18 0836 09/04/18 0918 09/04/18 1129  GLUCAP 93 101* 61* 74 78   Studies: No results found. Time spent: 25 minutes  Domenic Polite, MD [facility] Page via [email or web address]  09/04/2018 1:43 PM  Between 7PM-7AM,  please contact night-coverage at [email or web address], password [email or web address]

## 2018-09-04 NOTE — Progress Notes (Signed)
Page to Dr Broadus John   2w19 Harold. pt with improving alertness, however CBG is 52. seems pt has only been lethargic when above 70 today. please advise

## 2018-09-04 NOTE — Progress Notes (Signed)
Page to DR Eliezer Champagne 2w19 pt seems more lethargic than earlier, CBG 78 BP WNL, was about to medicate but concerned about PO efforts. Can give Lasix, but K+ is PO.

## 2018-09-04 NOTE — Progress Notes (Signed)
Negative MRSA on 12/4, pending lab order cannot be located in chart at this time to cancel current "to be collected" MRSA .

## 2018-09-04 NOTE — Progress Notes (Signed)
Patient offered multiple PO options; would not take any food/drink/icecream.

## 2018-09-05 LAB — BASIC METABOLIC PANEL
ANION GAP: 12 (ref 5–15)
BUN: 6 mg/dL — ABNORMAL LOW (ref 8–23)
CO2: 26 mmol/L (ref 22–32)
Calcium: 8.6 mg/dL — ABNORMAL LOW (ref 8.9–10.3)
Chloride: 95 mmol/L — ABNORMAL LOW (ref 98–111)
Creatinine, Ser: 0.69 mg/dL (ref 0.44–1.00)
GFR calc Af Amer: >60 mL/min (ref >60)
GFR calc non Af Amer: >60 mL/min (ref >60)
Glucose, Bld: 111 mg/dL — ABNORMAL HIGH (ref 70–99)
POTASSIUM: 3.2 mmol/L — AB (ref 3.5–5.1)
Sodium: 133 mmol/L — ABNORMAL LOW (ref 135–145)

## 2018-09-05 LAB — CBC
HCT: 26.3 % — ABNORMAL LOW (ref 36.0–46.0)
HEMOGLOBIN: 8.8 g/dL — AB (ref 12.0–15.0)
MCH: 25.6 pg — ABNORMAL LOW (ref 26.0–34.0)
MCHC: 33.5 g/dL (ref 30.0–36.0)
MCV: 76.5 fL — ABNORMAL LOW (ref 80.0–100.0)
Platelets: 174 10*3/uL (ref 150–400)
RBC: 3.44 MIL/uL — ABNORMAL LOW (ref 3.87–5.11)
RDW: 20.2 % — ABNORMAL HIGH (ref 11.5–15.5)
WBC: 9.1 10*3/uL (ref 4.0–10.5)
nRBC: 0.3 % — ABNORMAL HIGH (ref 0.0–0.2)

## 2018-09-05 LAB — GLUCOSE, CAPILLARY
GLUCOSE-CAPILLARY: 71 mg/dL (ref 70–99)
Glucose-Capillary: 77 mg/dL (ref 70–99)
Glucose-Capillary: 79 mg/dL (ref 70–99)
Glucose-Capillary: 30 mg/dL — CL (ref 70–99)
Glucose-Capillary: 62 mg/dL — ABNORMAL LOW (ref 70–99)

## 2018-09-05 MED ORDER — POTASSIUM CHLORIDE 20 MEQ/15ML (10%) PO SOLN
40.0000 meq | Freq: Once | ORAL | Status: AC
  Administered 2018-09-05: 40 meq via ORAL
  Filled 2018-09-05: qty 30

## 2018-09-05 NOTE — Plan of Care (Signed)
Pt appetite remains extremely poor and she refuses most meals/meds.

## 2018-09-05 NOTE — Progress Notes (Signed)
[facility] Progress Note  Patient: Cathy Taylor LEX:[id number]   PCP: Maryella Shivers, MD DOB: 08/07/1933   DOA: 08/24/2018   DOS: 09/05/2018   Date of Service: the patient was seen and examined on 09/05/2018  Brief hospital course: Pt. with PMH of dementia, CVA, chronic kidney disease stage III, type 2 diabetes, abdominal hernia, anxiety, depression, GERD, hypertension, hyperlipidemia multiple frequent hospitalizations admitted on 08/24/2018, with complaint of nausea vomiting and weight loss,  GI was consulted and patient underwent EGD, 08/26/2018 which showed, multiple esophageal ulcer with acute gastritis.  Patient was placed on IV Protonix drip and now transitioned to oral.   -Ongoing Failure to thrive, Po intake is very poor -Discussed poor prognosis with daughter -12/13 palliative medicine consulted for goals of care -No family at bedside, palliative RN attempted to contact family but they declined such discussions  Subjective:  -Oral intake remains minimal according to family she had a few bites of applesauce this morning  Assessment and Plan:  Esophageal/gastric ulcers, gastritis and duodenitis -Admitted with ongoing failure to thrive, nausea vomiting, weight loss -Gastroenterology consulted, underwent EGD which showed severe gastritis,  gastric ulcers, esophageal ulcer and duodenitis -now on Oral protonix, carafate added, Po intake very poor -GI has signed off, nothing further to add from gastroenterology standpoint -Patient refuses to eat or drink anything and not taking medications also for the most part -see below  Adult failure to thrive -multifactorial, primarily due to above and dementia, CVA, depression also affecting appetite -Oral intake is negligible with persistent secondary hypoglycemia -Continue Protonix, and Carafate , nothing further to add per gastroenterology -I also started her on Remeron and Marinol  -Discussed with daughter and granddaughter again  today, regarding consequences of minimal oral intake and refusal of medications, poor prognosis -Recommended consideration for palliative discussions, if she continues to decline, albumin is 1.7 -Per family she ate a few bites of applesauce and bacon today  -she is DNR  Severe protein calorie malnutrition -See discussion above  Acute hypoxic respiratory failure -CXR with bilateral pleural effusions -Diuresed with IV Lasix for couple of days, close to euvolemic  -Remains on D5 water, cut down rate to 30 cc an hour for persistent hypoglycemia -Wean O2 -Last echo 04/2017 with preserved EF and grade 1 diastolic dysfunction  Acute on chronic diastolic CHF -See above, diuretics held now  Mild leukocytosis -Reactive, resolved  Anemia due to chronic blood loss -As above, no overt bleeding at this time -Hemoglobin stable, monitor  Type 2 diabetes mellitus, uncontrolled with hypoglycemia -Was persistently hypoglycemic in the setting of poor p.o. intake -Continue D5 water at 30 cc an hour -Cosyntropin stim test was negative few weeks ago  Chronic kidney disease stage II -Creatinine was 1.4 on admission -Improved with hydration, lisinopril on hold  -Monitor kidney function with diuresis  Bilateral heel pressure sores -Wound care  Anxiety, depression Holding home mirtazapine, Lexapro due to lethargy and sleepiness  History of CVA -She was on 81 mg of aspirin at baseline this was held secondary to severe gastritis, recommend resumption in 1 to 2 weeks  Dementia -restarted Aricept  HTN -Hold home lisinopril  -Monitor  Hyperlipidemia -Continue home fenofibrate  Malnutrition Type: Nutrition Problem: Severe Malnutrition Etiology: acute illness(severe duodenitis)  Diet: soft diet DVT Prophylaxis: mechanical compression device  Advance goals of care discussion: DNR DNI  Family Communication: No family at bedside, discussed with daughter 12/12 Reattempt to contact  daughter again  Disposition: to be determined  Consultants: gastroenterology  Procedures: EGD  Scheduled Meds: . collagenase   Topical Daily  . donepezil  10 mg Oral QHS  . dronabinol  2.5 mg Oral BID AC  . feeding supplement (ENSURE ENLIVE)  237 mL Oral BID BM  . furosemide  40 mg Intravenous Once  . mirtazapine  7.5 mg Oral QHS  . pantoprazole  40 mg Oral BID AC  . potassium chloride  40 mEq Oral Once  . povidone-iodine   Topical BID  . sucralfate  1 g Oral TID WC & HS   Continuous Infusions: . dextrose 75 mL/hr at 09/04/18 1745   PRN Meds: acetaminophen **OR** acetaminophen, HYDROcodone-acetaminophen, ondansetron (ZOFRAN) IV Antibiotics: Anti-infectives (From admission, onward)   None       Objective: Physical Exam: Vitals:   09/04/18 1131 09/04/18 1643 09/05/18 0105 09/05/18 0716  BP: (!) 133/37 (!) 131/51 (!) 147/70 105/70  Pulse:  66 73 61  Resp:  14 16 14   Temp:  97.6 F (36.4 C) 98.1 F (36.7 C) (!) 97.4 F (36.3 C)  TempSrc:  Oral Oral Oral  SpO2:  93% 100% 94%  Weight:      Height:        Intake/Output Summary (Last 24 hours) at 09/05/2018 1127 Last data filed at 09/05/2018 0809 Gross per 24 hour  Intake 288.06 ml  Output 900 ml  Net -611.94 ml   Filed Weights   08/26/18 0727  Weight: 57.6 kg   Gen: Elderly frail chronically ill-appearing female, alert awake oriented to self and place only HEENT: PERRLA, Neck supple, no JVD Lungs: Decreased breath sounds at both bases CVS: RRR,No Gallops,Rubs or new Murmurs Abd: soft, Non tender, non distended, BS present Extremities: No edema in legs, 1PLUS left arm swelling  skin: no new rashes    Data Reviewed: CBC: Recent Labs  Lab 08/31/18 0303 09/01/18 0349 09/02/18 0309 09/03/18 0316 09/05/18 0854  WBC 7.0 9.1 8.9 8.5 9.1  HGB 8.3* 9.5* 9.1* 8.8* 8.8*  HCT 25.2* 30.1* 27.4* 27.3* 26.3*  MCV 77.8* 79.6* 77.8* 76.7* 76.5*  PLT 246 248 210 172 220   Basic Metabolic Panel: Recent Labs    Lab 08/30/18 0219 08/31/18 0303 09/01/18 0349 09/02/18 0309 09/03/18 0316 09/05/18 0854  NA 136 136 137 135 133* 133*  K 5.1 4.5 4.5 3.7 3.9 3.2*  CL 110 110 109 103 99 95*  CO2 21* 20* 21* 21* 22 26  GLUCOSE 118* 92 88 134* 127* 111*  BUN <5* 5* <5* 5* 7* 6*  CREATININE 0.95 0.92 1.01* 0.95 1.02* 0.69  CALCIUM 8.4* 8.5* 8.8* 8.7* 8.8* 8.6*  MG 1.4*  --   --   --   --   --     Liver Function Tests: No results for input(s): AST, ALT, ALKPHOS, BILITOT, PROT, ALBUMIN in the last 168 hours. No results for input(s): LIPASE, AMYLASE in the last 168 hours. No results for input(s): AMMONIA in the last 168 hours. Coagulation Profile: No results for input(s): INR, PROTIME in the last 168 hours. Cardiac Enzymes: No results for input(s): CKTOTAL, CKMB, CKMBINDEX, TROPONINI in the last 168 hours. BNP (last 3 results) No results for input(s): PROBNP in the last 8760 hours. CBG: Recent Labs  Lab 09/04/18 1703 09/04/18 2057 09/05/18 0101 09/05/18 0514 09/05/18 0716  GLUCAP 52* 92 77 71 79   Studies: No results found. Time spent: 25 minutes  Domenic Polite, MD [facility] Page via [email or web address]  09/05/2018 11:27 AM  Between 7PM-7AM, please contact night-coverage at [email or web address], password  [email or web address]

## 2018-09-06 LAB — GLUCOSE, CAPILLARY
GLUCOSE-CAPILLARY: 104 mg/dL — AB (ref 70–99)
GLUCOSE-CAPILLARY: 85 mg/dL (ref 70–99)
Glucose-Capillary: 154 mg/dL — ABNORMAL HIGH (ref 70–99)
Glucose-Capillary: 51 mg/dL — ABNORMAL LOW (ref 70–99)
Glucose-Capillary: 69 mg/dL — ABNORMAL LOW (ref 70–99)
Glucose-Capillary: 70 mg/dL (ref 70–99)
Glucose-Capillary: 72 mg/dL (ref 70–99)
Glucose-Capillary: 81 mg/dL (ref 70–99)
Glucose-Capillary: 90 mg/dL (ref 70–99)
Glucose-Capillary: 99 mg/dL (ref 70–99)

## 2018-09-06 NOTE — Progress Notes (Signed)
[facility] Progress Note  Patient: Cathy Taylor KXF:[id number]   PCP: Maryella Shivers, MD DOB: 1933-06-24   DOA: 08/24/2018   DOS: 09/06/2018   Date of Service: the patient was seen and examined on 09/06/2018  Brief hospital course: Pt. with PMH of dementia, CVA, chronic kidney disease stage III, type 2 diabetes, abdominal hernia, anxiety, depression, GERD, hypertension, hyperlipidemia multiple frequent hospitalizations admitted on 08/24/2018, with complaint of nausea vomiting and weight loss,  GI was consulted and patient underwent EGD, 08/26/2018 which showed, multiple esophageal ulcer with acute gastritis.  Patient was placed on IV Protonix drip and now transitioned to oral.   -Ongoing Failure to thrive, Po intake is very poor -Discussed poor prognosis with daughter -12/13 palliative medicine consulted for goals of care -palliative attempted to contact family but they declined such discussions -PO intake is minimally better in last 2days  Subjective:  -continues to have Poor Po intake, took a few bites of her muffin and banana this morning  Assessment and Plan:  Esophageal/gastric ulcers, gastritis and duodenitis -Admitted with ongoing failure to thrive, nausea vomiting, weight loss -Gastroenterology consulted, underwent EGD which showed severe gastritis,  gastric ulcers, esophageal ulcer and duodenitis -now on Oral protonix, carafate added, Po intake remains very poor but minimally better -GI has signed off, nothing further to add from gastroenterology standpoint -History of failure to thrive, minimal improvement in p.o. intake in the last 24 hours -Family adamantly declines palliative care discussions  Adult failure to thrive -multifactorial, primarily due to above and dementia, CVA, depression also affecting appetite -Oral intake is negligible with persistent secondary hypoglycemia -Continue Protonix, and Carafate , nothing further to add per gastroenterology -I also started  her on Remeron and Marinol  -Discussed with daughter and granddaughter again yesterday, regarding consequences of minimal oral intake and refusal of medications, poor prognosis -Recommended consideration for palliative discussions, if she continues to decline, albumin is 1.7 -Per family she ate a few bites of applesauce, bacon yesterday, had some muffin and bananas today -she is DNR  Severe protein calorie malnutrition -See discussion above  Acute hypoxic respiratory failure -CXR with bilateral pleural effusions -Diuresed with IV Lasix  -Close to euvolemic now, diuretics on hold, discontinue D5 water -Wean O2 -Last echo 04/2017 with preserved EF and grade 1 diastolic dysfunction  Acute on chronic diastolic CHF -See above, diuretics held now  Mild leukocytosis -Reactive, resolved  Anemia due to chronic blood loss -As above, no overt bleeding at this time -Hemoglobin stable, monitor  Type 2 diabetes mellitus, uncontrolled with hypoglycemia -Was persistently hypoglycemic in the setting of poor p.o. intake -CBG is improved in the last 24 hours on very low-dose of D5 water, will discontinue and monitor -Cosyntropin stim test was negative few weeks ago  Chronic kidney disease stage II -Creatinine was 1.4 on admission -Improved with hydration, lisinopril on hold  -Monitor kidney function with diuresis  Bilateral heel pressure sores -Wound care  Anxiety, depression Holding home mirtazapine, Lexapro due to lethargy and sleepiness  History of CVA -She was on 81 mg of aspirin at baseline this was held secondary to severe gastritis, recommend resumption in 1 to 2 weeks  Dementia -restarted Aricept  HTN -Hold home lisinopril  -Monitor  Hyperlipidemia -Continue home fenofibrate  Malnutrition Type: Nutrition Problem: Severe Malnutrition Etiology: acute illness(severe duodenitis)  Diet: soft diet DVT Prophylaxis: mechanical compression device  Advance goals of care  discussion: DNR DNI  Family Communication: No family at bedside, discussed with daughter 12/12 Discussed again with  daughter and granddaughter 12/15  Disposition: to be determined  Consultants: gastroenterology  Procedures: EGD  Scheduled Meds: . collagenase   Topical Daily  . donepezil  10 mg Oral QHS  . dronabinol  2.5 mg Oral BID AC  . feeding supplement (ENSURE ENLIVE)  237 mL Oral BID BM  . furosemide  40 mg Intravenous Once  . mirtazapine  7.5 mg Oral QHS  . pantoprazole  40 mg Oral BID AC  . potassium chloride  40 mEq Oral Once  . povidone-iodine   Topical BID  . sucralfate  1 g Oral TID WC & HS   Continuous Infusions:  PRN Meds: acetaminophen **OR** acetaminophen, HYDROcodone-acetaminophen, ondansetron (ZOFRAN) IV Antibiotics: Anti-infectives (From admission, onward)   None       Objective: Physical Exam: Vitals:   09/05/18 0716 09/05/18 1621 09/06/18 0024 09/06/18 0815  BP: 105/70 (!) 143/50 134/76 117/72  Pulse: 61 67 71 73  Resp: 14 16 12    Temp: (!) 97.4 F (36.3 C) 97.6 F (36.4 C) 97.7 F (36.5 C) 97.7 F (36.5 C)  TempSrc: Oral Oral Oral Oral  SpO2: 94%  (!) 83% 100%  Weight:      Height:       No intake or output data in the 24 hours ending 09/06/18 1225 Filed Weights   08/26/18 0727  Weight: 57.6 kg   Gen: Elderly frail, chronically ill-appearing female oriented to self and place HEENT: PERRLA, Neck supple, no JVD Lungs: decreased breath sounds at both bases CVS: RRR,No Gallops,Rubs or new Murmurs Abd: soft, Non tender, non distended, BS present Extremities: No edema in legs, 1PLUS left arm swelling  skin: no new rashes    Data Reviewed: CBC: Recent Labs  Lab 08/31/18 0303 09/01/18 0349 09/02/18 0309 09/03/18 0316 09/05/18 0854  WBC 7.0 9.1 8.9 8.5 9.1  HGB 8.3* 9.5* 9.1* 8.8* 8.8*  HCT 25.2* 30.1* 27.4* 27.3* 26.3*  MCV 77.8* 79.6* 77.8* 76.7* 76.5*  PLT 246 248 210 172 627   Basic Metabolic Panel: Recent Labs  Lab  08/31/18 0303 09/01/18 0349 09/02/18 0309 09/03/18 0316 09/05/18 0854  NA 136 137 135 133* 133*  K 4.5 4.5 3.7 3.9 3.2*  CL 110 109 103 99 95*  CO2 20* 21* 21* 22 26  GLUCOSE 92 88 134* 127* 111*  BUN 5* <5* 5* 7* 6*  CREATININE 0.92 1.01* 0.95 1.02* 0.69  CALCIUM 8.5* 8.8* 8.7* 8.8* 8.6*    Liver Function Tests: No results for input(s): AST, ALT, ALKPHOS, BILITOT, PROT, ALBUMIN in the last 168 hours. No results for input(s): LIPASE, AMYLASE in the last 168 hours. No results for input(s): AMMONIA in the last 168 hours. Coagulation Profile: No results for input(s): INR, PROTIME in the last 168 hours. Cardiac Enzymes: No results for input(s): CKTOTAL, CKMB, CKMBINDEX, TROPONINI in the last 168 hours. BNP (last 3 results) No results for input(s): PROBNP in the last 8760 hours. CBG: Recent Labs  Lab 09/05/18 1622 09/05/18 1957 09/06/18 0022 09/06/18 0814 09/06/18 1204  GLUCAP 154* 104* 85 99 90   Studies: No results found. Time spent: 25 minutes  Domenic Polite, MD [facility] Page via [email or web address]  09/06/2018 12:25 PM  Between 7PM-7AM, please contact night-coverage at [email or web address], password [facility]

## 2018-09-07 ENCOUNTER — Telehealth: Payer: Self-pay | Admitting: Cardiology

## 2018-09-07 LAB — GLUCOSE, CAPILLARY
Glucose-Capillary: 70 mg/dL (ref 70–99)
Glucose-Capillary: 75 mg/dL (ref 70–99)
Glucose-Capillary: 67 mg/dL — ABNORMAL LOW (ref 70–99)
Glucose-Capillary: 70 mg/dL (ref 70–99)

## 2018-09-07 MED ORDER — DRONABINOL 2.5 MG PO CAPS: 2.5000 mg | ORAL_CAPSULE | Freq: Two times a day (BID) | ORAL | Status: DC

## 2018-09-07 MED ORDER — ENSURE ENLIVE PO LIQD: 237.0000 mL | Freq: Two times a day (BID) | ORAL | 12 refills | Status: DC

## 2018-09-07 MED ORDER — SUCRALFATE 1 GM/10ML PO SUSP: 1.0000 g | Freq: Three times a day (TID) | ORAL | 0 refills | Status: DC

## 2018-09-07 MED ORDER — PANTOPRAZOLE SODIUM 40 MG PO TBEC: 40.0000 mg | DELAYED_RELEASE_TABLET | Freq: Two times a day (BID) | ORAL | 0 refills | Status: DC

## 2018-09-07 MED ORDER — MIRTAZAPINE 7.5 MG PO TABS: 7.5000 mg | ORAL_TABLET | Freq: Every day | ORAL | 10 refills | Status: DC

## 2018-09-07 NOTE — Progress Notes (Signed)
This RN called and gave report to RN at [employer].

## 2018-09-07 NOTE — Discharge Summary (Signed)
Physician Discharge Summary  Joeli Fenner XIP:[id number] DOB: [birth year] DOA: 08/24/2018  PCP: Maryella Shivers, MD  Admit date: 08/24/2018 Discharge date: 09/07/2018  Time spent: 35 minutes  Recommendations for Outpatient Follow-up:  1. Please consider palliative care evaluation and follow-up if ongoing failure to thrive and minimal oral intake continues 2. Follow-up with PCP in 1 week   Discharge Diagnoses:  Adult failure to thrive Dementia Severe protein calorie malnutrition Gastric ulcers   HTN (hypertension)   Diabetes mellitus (HCC)   Anxiety and depression   Hypokalemia   HLD (hyperlipidemia)   Failure to thrive in adult   Leukocytosis   Physical deconditioning   Chronic anemia   Hypoglycemia   CKD (chronic kidney disease) stage 3, GFR 30-59 ml/min (HCC)   Dementia (HCC)   Total bilirubin, elevated   Hypomagnesemia   Nausea and vomiting in adult   Protein-calorie malnutrition, severe   Discharge Condition: Stable  Diet recommendation: Heart healthy  Filed Weights   08/26/18 0727  Weight: 57.6 kg    History of present illness:  PMH of dementia, CVA, chronic kidney disease stage III, type 2 diabetes, abdominal hernia, anxiety, depression, GERD, hypertension, hyperlipidemia multiple frequent hospitalizations admitted on 08/24/2018, with complaint of nausea vomiting and weight loss,  GI was consulted and patient underwent EGD, 08/26/2018 which showed, multiple esophageal ulcer with acute gastritis.  Patient was placed on IV Protonix drip and now transitioned to oral.   -Ongoing Failure to thrive, Po intake is very poor  Hospital Course:   Esophageal/gastric ulcers, gastritis and duodenitis -Admitted with ongoing failure to thrive, nausea vomiting, weight loss -Gastroenterology consulted, underwent EGD which showed severe gastritis,  gastric ulcers, esophageal ulcer and duodenitis -now on Oral protonix, carafate added, Po intake remains very poor but marginally  better in the last 48 hours -GI has signed off, nothing further to add from gastroenterology standpoint -Family adamantly declines palliative care discussions this time -Discharged back to SNF, consider palliative care follow-up at SNF if ongoing failure to thrive and decline continues  Adult failure to thrive -multifactorial, primarily due to above and dementia, CVA, depression also affecting appetite -Oral intake is negligible with was persistently glycemic secondary to minimal p.o. intake for over 10 days in the hospital, finally weaned off dextrose infusion -Continue Protonix, and Carafate , nothing further to add per gastroenterology -I also started her on Remeron and Marinol to stimulate her appetite -Discussed with daughter multiple times and granddaughter over the weekend, regarding consequences of minimal oral intake and refusal of medications, poor prognosis -Recommended consideration for palliative discussions, if she continues to decline, albumin is 1.7 -she is DNR -Now eating a few bites here and there, needs to be fed  Severe protein calorie malnutrition -See discussion above  Acute hypoxic respiratory failure -CXR with bilateral pleural effusions -Diuresed with IV Lasix  -Close to euvolemic now, diuretics discontinued -Last echo 04/2017 with preserved EF and grade 1 diastolic dysfunction  Acute on chronic diastolic CHF -See above, diuretics held now -Does not need Lasix at this time especially considering minimal oral intake, this can be reevaluated and started as needed depending on volume status at SNF  Mild leukocytosis -Reactive, resolved  Anemia due to chronic blood loss -As above, no overt bleeding at this time -Hemoglobin stable, monitor  Type 2 diabetes mellitus, uncontrolled with hypoglycemia -Was persistently hypoglycemic in the setting of poor p.o. intake -CBG is improved in the last 24 hours on very low-dose of D5 water, will discontinue and  monitor -  Cosyntropin stim test was negative few weeks ago  Chronic kidney disease stage II -Creatinine was 1.4 on admission -Improved with hydration, lisinopril on hold   Bilateral heel pressure sores -Wound care  Anxiety, depression Holding home mirtazapine,Lexapro due to lethargy and sleepiness  History of CVA -She was on 81 mg of aspirin at baseline this was held secondary to severe gastritis, recommend resumption in 1 to 2 weeks  Dementia -restarted Aricept  HTN -Blood pressure soft, stopped lisinopril   Hyperlipidemia -Continue home fenofibrate    Discharge Exam: Vitals:   09/06/18 2302 09/07/18 0750  BP: 137/70 (!) 126/57  Pulse: 69 66  Resp: 14 15  Temp: 97.9 F (36.6 C) 97.8 F (36.6 C)  SpO2:  100%    General: Alert awake, oriented to self and place only, mild cognitive deficits Cardiovascular: S1-S2/regular rate rhythm Respiratory: Poor air movement, diminished at both bases  Discharge Instructions   Discharge Instructions    Diet general   Complete by:  As directed    Increase activity slowly   Complete by:  As directed      Allergies as of 09/07/2018   No Known Allergies     Medication List    STOP taking these medications   carvedilol 6.25 MG tablet Commonly known as:  COREG   gabapentin 100 MG capsule Commonly known as:  NEURONTIN   levofloxacin 500 MG tablet Commonly known as:  LEVAQUIN   lisinopril 5 MG tablet Commonly known as:  PRINIVIL,ZESTRIL   loratadine 10 MG tablet Commonly known as:  CLARITIN     TAKE these medications   acetaminophen 325 MG tablet Commonly known as:  TYLENOL Take 650 mg by mouth every 6 (six) hours as needed (for pain).   aspirin 81 MG tablet Take 1 tablet (81 mg total) by mouth daily.   donepezil 10 MG tablet Commonly known as:  ARICEPT Take 10 mg by mouth at bedtime.   dronabinol 2.5 MG capsule Commonly known as:  MARINOL Take 1 capsule (2.5 mg total) by mouth 2 (two) times  daily before lunch and supper.   escitalopram 10 MG tablet Commonly known as:  LEXAPRO Take 10 mg by mouth daily.   feeding supplement (ENSURE ENLIVE) Liqd Take 237 mLs by mouth 2 (two) times daily between meals.   fenofibrate 145 MG tablet Commonly known as:  TRICOR Take 145 mg by mouth daily.   folic acid 1 MG tablet Commonly known as:  FOLVITE Take 1 mg by mouth daily.   mirtazapine 7.5 MG tablet Commonly known as:  REMERON Take 1 tablet (7.5 mg total) by mouth at bedtime. What changed:  how much to take   ondansetron 4 MG tablet Commonly known as:  ZOFRAN Take 4 mg by mouth every 8 (eight) hours as needed for nausea or vomiting.   pantoprazole 40 MG tablet Commonly known as:  PROTONIX Take 1 tablet (40 mg total) by mouth 2 (two) times daily before a meal.   sucralfate 1 GM/10ML suspension Commonly known as:  CARAFATE Take 10 mLs (1 g total) by mouth 4 (four) times daily -  with meals and at bedtime.   vitamin C 500 MG tablet Commonly known as:  ASCORBIC ACID Take 500 mg by mouth daily.   Vitamin D-3 25 MCG (1000 UT) Caps Take 1,000 Units by mouth daily.   zinc sulfate 220 (50 Zn) MG capsule Take 220 mg by mouth daily.      No Known Allergies Contact information for  after-discharge care    Destination    [facility]erred [facility] .   Service:  Skilled Nursing Contact information: [address] [place] [phone]               The results of significant diagnostics from this hospitalization (including imaging, microbiology, ancillary and laboratory) are listed below for reference.    Significant Diagnostic Studies: Dg Chest 2 View  Result Date: 09/01/2018 CLINICAL DATA:  Hypoxia EXAM: CHEST - 2 VIEW COMPARISON:  08/27/2018 FINDINGS: Moderate bilateral pleural effusions. Mild cardiomegaly. Left base atelectasis. No overt edema. Loop recorder device projects over the left lower chest. Prior left  shoulder replacement. Advanced degenerative changes in the right shoulder. No acute bony abnormality. IMPRESSION: Moderate bilateral pleural effusions.  Left base atelectasis. Mild cardiomegaly. Electronically Signed   By: Rolm Baptise M.D.   On: 09/01/2018 11:30   Dg Chest 2 View  Result Date: 08/24/2018 CLINICAL DATA:  Leukocytosis EXAM: CHEST - 2 VIEW COMPARISON:  07/03/2018 FINDINGS: Heart is upper limits normal in size. No confluent airspace opacities, effusions or edema. No acute bony abnormality. Advanced degenerative changes in the right shoulder. Prior left shoulder replacement. Loop recorder device in the left anterior chest wall. IMPRESSION: No active cardiopulmonary disease. Electronically Signed   By: Rolm Baptise M.D.   On: 08/24/2018 21:44   Ct Abdomen Pelvis W Contrast  Result Date: 08/24/2018 CLINICAL DATA:  Nausea, vomiting EXAM: CT ABDOMEN AND PELVIS WITH CONTRAST TECHNIQUE: Multidetector CT imaging of the abdomen and pelvis was performed using the standard protocol following bolus administration of intravenous contrast. CONTRAST:  151mL OMNIPAQUE IOHEXOL 300 MG/ML  SOLN COMPARISON:  06/30/2018 FINDINGS: Lower chest: Lung bases are clear. No effusions. Heart is normal size. Hepatobiliary: Small layering stone within the gallbladder fundus. No focal hepatic abnormality or biliary ductal dilatation. Pancreas: Mildly prominent pancreatic duct, with a focal dilated area in the pancreatic body measuring up to 6 mm. Pancreatic atrophy. No focal abnormality. Spleen: No focal abnormality.  Normal size. Adrenals/Urinary Tract: No adrenal abnormality. No focal renal abnormality. No stones or hydronephrosis. Urinary bladder is unremarkable. Stomach/Bowel: Stomach, large and small bowel grossly unremarkable. Vascular/Lymphatic: Aortic atherosclerosis. No enlarged abdominal or pelvic lymph nodes. Reproductive: Uterus and adnexa unremarkable.  No mass. Other: No free fluid or free air. Musculoskeletal:  Degenerative changes in the lumbar spine. Leftward scoliosis. No acute bony abnormality. IMPRESSION: Small layering dependent gallstone within the gallbladder. No acute findings in the abdomen or pelvis. Aortic atherosclerosis. Electronically Signed   By: Rolm Baptise M.D.   On: 08/24/2018 19:21   Dg Chest Port 1 View  Result Date: 08/27/2018 CLINICAL DATA:  Shortness of Breath EXAM: PORTABLE CHEST 1 VIEW COMPARISON:  08/24/2018 FINDINGS: Cardiac shadow is within normal limits. The lungs are well aerated bilaterally without focal infiltrate. Loop recorder is again noted and stable. Aortic calcifications are seen. No acute bony abnormality is noted. Postsurgical changes in left shoulder are seen. IMPRESSION: No acute abnormality noted. Electronically Signed   By: Inez Catalina M.D.   On: 08/27/2018 13:01    Microbiology: No results found for this or any previous visit (from the past 240 hour(s)).   Labs: Basic Metabolic Panel: Recent Labs  Lab 09/01/18 0349 09/02/18 0309 09/03/18 0316 09/05/18 0854  NA 137 135 133* 133*  K 4.5 3.7 3.9 3.2*  CL 109 103 99 95*  CO2 21* 21* 22 26  GLUCOSE 88 134* 127* 111*  BUN <5* 5* 7*  6*  CREATININE 1.01* 0.95 1.02* 0.69  CALCIUM 8.8* 8.7* 8.8* 8.6*   Liver Function Tests: No results for input(s): AST, ALT, ALKPHOS, BILITOT, PROT, ALBUMIN in the last 168 hours. No results for input(s): LIPASE, AMYLASE in the last 168 hours. No results for input(s): AMMONIA in the last 168 hours. CBC: Recent Labs  Lab 09/01/18 0349 09/02/18 0309 09/03/18 0316 09/05/18 0854  WBC 9.1 8.9 8.5 9.1  HGB 9.5* 9.1* 8.8* 8.8*  HCT 30.1* 27.4* 27.3* 26.3*  MCV 79.6* 77.8* 76.7* 76.5*  PLT 248 210 172 174   Cardiac Enzymes: No results for input(s): CKTOTAL, CKMB, CKMBINDEX, TROPONINI in the last 168 hours. BNP: BNP (last 3 results) Recent Labs    07/03/18 0447  BNP 733.7*    ProBNP (last 3 results) No results for input(s): PROBNP in the last 8760  hours.  CBG: Recent Labs  Lab 09/06/18 2144 09/06/18 2145 09/06/18 2339 09/07/18 0334 09/07/18 0749  GLUCAP 69* 81 70 70 75       Signed:  Domenic Polite MD.  [facility] 09/07/2018, 10:58 AM

## 2018-09-07 NOTE — Clinical Social Work Note (Signed)
Clinical Social Worker facilitated patient discharge including contacting patient family and facility to confirm patient discharge plans.  Clinical information faxed to facility and family agreeable with plan.  CSW arranged ambulance transport via [place] to [facility].  RN to call [phone] for report prior to discharge.  Clinical Social Worker will sign off for now as social work intervention is no longer needed. Please consult [place] again if new need arises.  [place], [place]

## 2018-09-07 NOTE — Progress Notes (Signed)
PT Cancellation Note  Patient Details Name: Nerea Bordenave MRN: [id number] DOB: 07/18/1933   Cancelled Treatment:    Reason Eval/Treat Not Completed: Fatigue/lethargy limiting ability to participate.  Pt is not interested in doing anything for PT and will try again at another time.   Ramond Dial 09/07/2018, 3:37 PM   Mee Hives, PT MS Acute Rehab Dept. Number: [place] and [place]

## 2018-09-07 NOTE — Telephone Encounter (Signed)
Spoke w/ pt daughtert and requested that she send a manual transmission b/c her home monitor has not updated in at least 14 days.

## 2018-09-07 NOTE — Clinical Social Work Placement (Signed)
   CLINICAL SOCIAL WORK PLACEMENT  NOTE  Date:  09/07/2018  Patient Details  Name: Cathy Taylor MRN: [id number] Date of Birth: 08-08-1933  Clinical Social Work is seeking post-discharge placement for this patient at the [place] level of care (*CSW will initial, date and re-position this form in  chart as items are completed):      Patient/family provided with [facility] Work Department's list of facilities offering this level of care within the geographic area requested by the patient (or if unable, by the patient's family).  Yes   Patient/family informed of their freedom to choose among providers that offer the needed level of care, that participate in Medicare, Medicaid or managed care program needed by the patient, have an available bed and are willing to accept the patient.      Patient/family informed of [facility]'s ownership interest in [facility] and [facility], as well as of the fact that they are under no obligation to receive care at these facilities.  PASRR submitted to EDS on       PASRR number received on       Existing PASRR number confirmed on 08/25/18     FL2 transmitted to all facilities in geographic area requested by pt/family on 08/25/18     FL2 transmitted to all facilities within larger geographic area on       Patient informed that his/her managed care company has contracts with or will negotiate with certain facilities, including the following:        Yes   Patient/family informed of bed offers received.  Patient chooses bed at [facility]     Physician recommends and patient chooses bed at      Patient to be transferred to [facility] on 09/07/18.  Patient to be transferred to facility by [facility]     Patient family notified on 09/07/18 of transfer.  Name of family member notified:  [name]     PHYSICIAN       Additional Comment:     _______________________________________________ Eileen Stanford, LCSW 09/07/2018, 12:12 PM

## 2018-09-13 ENCOUNTER — Telehealth: Payer: Self-pay

## 2018-09-13 ENCOUNTER — Other Ambulatory Visit: Payer: Self-pay

## 2018-09-13 ENCOUNTER — Encounter (HOSPITAL_COMMUNITY): Payer: Self-pay

## 2018-09-13 ENCOUNTER — Inpatient Hospital Stay (HOSPITAL_COMMUNITY)
Admission: AD | Admit: 2018-09-13 | Discharge: 2018-09-18 | DRG: 393 | Disposition: A | Discharge status: Hospice | Payer: Medicare Other | Source: Other Acute Inpatient Hospital | Attending: Family Medicine | Admitting: Family Medicine

## 2018-09-13 DIAGNOSIS — D638 Anemia in other chronic diseases classified elsewhere: Secondary | ICD-10-CM | POA: Diagnosis not present

## 2018-09-13 DIAGNOSIS — Z853 Personal history of malignant neoplasm of breast: Secondary | ICD-10-CM

## 2018-09-13 DIAGNOSIS — Z836 Family history of other diseases of the respiratory system: Secondary | ICD-10-CM

## 2018-09-13 DIAGNOSIS — Z6822 Body mass index (BMI) 22.0-22.9, adult: Secondary | ICD-10-CM

## 2018-09-13 DIAGNOSIS — E119 Type 2 diabetes mellitus without complications: Secondary | ICD-10-CM

## 2018-09-13 DIAGNOSIS — K210 Gastro-esophageal reflux disease with esophagitis: Secondary | ICD-10-CM | POA: Diagnosis present

## 2018-09-13 DIAGNOSIS — Z8249 Family history of ischemic heart disease and other diseases of the circulatory system: Secondary | ICD-10-CM

## 2018-09-13 DIAGNOSIS — K659 Peritonitis, unspecified: Secondary | ICD-10-CM

## 2018-09-13 DIAGNOSIS — K551 Chronic vascular disorders of intestine: Secondary | ICD-10-CM

## 2018-09-13 DIAGNOSIS — E1159 Type 2 diabetes mellitus with other circulatory complications: Secondary | ICD-10-CM

## 2018-09-13 DIAGNOSIS — E1122 Type 2 diabetes mellitus with diabetic chronic kidney disease: Secondary | ICD-10-CM | POA: Diagnosis not present

## 2018-09-13 DIAGNOSIS — E785 Hyperlipidemia, unspecified: Secondary | ICD-10-CM | POA: Diagnosis not present

## 2018-09-13 DIAGNOSIS — D649 Anemia, unspecified: Secondary | ICD-10-CM | POA: Diagnosis not present

## 2018-09-13 DIAGNOSIS — Z950 Presence of cardiac pacemaker: Secondary | ICD-10-CM

## 2018-09-13 DIAGNOSIS — E872 Acidosis, unspecified: Secondary | ICD-10-CM

## 2018-09-13 DIAGNOSIS — L89610 Pressure ulcer of right heel, unstageable: Secondary | ICD-10-CM | POA: Diagnosis not present

## 2018-09-13 DIAGNOSIS — I429 Cardiomyopathy, unspecified: Secondary | ICD-10-CM | POA: Diagnosis not present

## 2018-09-13 DIAGNOSIS — E878 Other disorders of electrolyte and fluid balance, not elsewhere classified: Secondary | ICD-10-CM | POA: Diagnosis not present

## 2018-09-13 DIAGNOSIS — I1310 Hypertensive heart and chronic kidney disease without heart failure, with stage 1 through stage 4 chronic kidney disease, or unspecified chronic kidney disease: Secondary | ICD-10-CM | POA: Diagnosis not present

## 2018-09-13 DIAGNOSIS — A419 Sepsis, unspecified organism: Secondary | ICD-10-CM

## 2018-09-13 DIAGNOSIS — J189 Pneumonia, unspecified organism: Secondary | ICD-10-CM | POA: Diagnosis present

## 2018-09-13 DIAGNOSIS — F0150 Vascular dementia without behavioral disturbance: Secondary | ICD-10-CM | POA: Diagnosis present

## 2018-09-13 DIAGNOSIS — E11649 Type 2 diabetes mellitus with hypoglycemia without coma: Secondary | ICD-10-CM | POA: Diagnosis present

## 2018-09-13 DIAGNOSIS — R195 Other fecal abnormalities: Secondary | ICD-10-CM | POA: Diagnosis present

## 2018-09-13 DIAGNOSIS — Z9011 Acquired absence of right breast and nipple: Secondary | ICD-10-CM

## 2018-09-13 DIAGNOSIS — L89100 Pressure ulcer of unspecified part of back, unstageable: Secondary | ICD-10-CM | POA: Diagnosis present

## 2018-09-13 DIAGNOSIS — L8990 Pressure ulcer of unspecified site, unspecified stage: Secondary | ICD-10-CM | POA: Diagnosis present

## 2018-09-13 DIAGNOSIS — E43 Unspecified severe protein-calorie malnutrition: Secondary | ICD-10-CM | POA: Diagnosis not present

## 2018-09-13 DIAGNOSIS — L89620 Pressure ulcer of left heel, unstageable: Secondary | ICD-10-CM | POA: Diagnosis not present

## 2018-09-13 DIAGNOSIS — J90 Pleural effusion, not elsewhere classified: Secondary | ICD-10-CM | POA: Diagnosis not present

## 2018-09-13 DIAGNOSIS — N183 Chronic kidney disease, stage 3 unspecified: Secondary | ICD-10-CM | POA: Diagnosis present

## 2018-09-13 DIAGNOSIS — R680 Hypothermia, not associated with low environmental temperature: Secondary | ICD-10-CM | POA: Diagnosis not present

## 2018-09-13 DIAGNOSIS — R5381 Other malaise: Secondary | ICD-10-CM

## 2018-09-13 DIAGNOSIS — R627 Adult failure to thrive: Secondary | ICD-10-CM | POA: Diagnosis present

## 2018-09-13 DIAGNOSIS — Z79899 Other long term (current) drug therapy: Secondary | ICD-10-CM

## 2018-09-13 DIAGNOSIS — N390 Urinary tract infection, site not specified: Secondary | ICD-10-CM | POA: Diagnosis present

## 2018-09-13 DIAGNOSIS — L89609 Pressure ulcer of unspecified heel, unspecified stage: Secondary | ICD-10-CM

## 2018-09-13 DIAGNOSIS — J9811 Atelectasis: Secondary | ICD-10-CM | POA: Diagnosis present

## 2018-09-13 DIAGNOSIS — G301 Alzheimer's disease with late onset: Secondary | ICD-10-CM

## 2018-09-13 DIAGNOSIS — F01518 Vascular dementia, unspecified severity, with other behavioral disturbance: Secondary | ICD-10-CM | POA: Diagnosis present

## 2018-09-13 DIAGNOSIS — D62 Acute posthemorrhagic anemia: Secondary | ICD-10-CM

## 2018-09-13 DIAGNOSIS — N179 Acute kidney failure, unspecified: Secondary | ICD-10-CM | POA: Diagnosis not present

## 2018-09-13 DIAGNOSIS — F17221 Nicotine dependence, chewing tobacco, in remission: Secondary | ICD-10-CM | POA: Diagnosis present

## 2018-09-13 DIAGNOSIS — F329 Major depressive disorder, single episode, unspecified: Secondary | ICD-10-CM | POA: Diagnosis present

## 2018-09-13 DIAGNOSIS — F0151 Vascular dementia with behavioral disturbance: Secondary | ICD-10-CM

## 2018-09-13 DIAGNOSIS — Z96612 Presence of left artificial shoulder joint: Secondary | ICD-10-CM | POA: Diagnosis present

## 2018-09-13 DIAGNOSIS — Z8673 Personal history of transient ischemic attack (TIA), and cerebral infarction without residual deficits: Secondary | ICD-10-CM

## 2018-09-13 DIAGNOSIS — F0280 Dementia in other diseases classified elsewhere without behavioral disturbance: Secondary | ICD-10-CM

## 2018-09-13 DIAGNOSIS — R0600 Dyspnea, unspecified: Secondary | ICD-10-CM

## 2018-09-13 DIAGNOSIS — K2210 Ulcer of esophagus without bleeding: Secondary | ICD-10-CM | POA: Diagnosis not present

## 2018-09-13 DIAGNOSIS — E860 Dehydration: Secondary | ICD-10-CM | POA: Diagnosis not present

## 2018-09-13 DIAGNOSIS — R319 Hematuria, unspecified: Secondary | ICD-10-CM | POA: Diagnosis present

## 2018-09-13 DIAGNOSIS — K55019 Acute (reversible) ischemia of small intestine, extent unspecified: Secondary | ICD-10-CM | POA: Diagnosis not present

## 2018-09-13 DIAGNOSIS — Z823 Family history of stroke: Secondary | ICD-10-CM

## 2018-09-13 DIAGNOSIS — K259 Gastric ulcer, unspecified as acute or chronic, without hemorrhage or perforation: Secondary | ICD-10-CM | POA: Diagnosis present

## 2018-09-13 DIAGNOSIS — Z7982 Long term (current) use of aspirin: Secondary | ICD-10-CM

## 2018-09-13 DIAGNOSIS — Z7401 Bed confinement status: Secondary | ICD-10-CM

## 2018-09-13 DIAGNOSIS — K559 Vascular disorder of intestine, unspecified: Secondary | ICD-10-CM | POA: Diagnosis present

## 2018-09-13 DIAGNOSIS — Z66 Do not resuscitate: Secondary | ICD-10-CM | POA: Diagnosis present

## 2018-09-13 DIAGNOSIS — I10 Essential (primary) hypertension: Secondary | ICD-10-CM

## 2018-09-13 DIAGNOSIS — E876 Hypokalemia: Secondary | ICD-10-CM

## 2018-09-13 DIAGNOSIS — Z515 Encounter for palliative care: Secondary | ICD-10-CM | POA: Diagnosis present

## 2018-09-13 LAB — PROTIME-INR
INR: 1.28
Prothrombin Time: 15.9 seconds — ABNORMAL HIGH (ref 11.4–15.2)

## 2018-09-13 LAB — COMPREHENSIVE METABOLIC PANEL
ALT: 15 U/L (ref 0–44)
AST: 22 U/L (ref 15–41)
Albumin: 2 g/dL — ABNORMAL LOW (ref 3.5–5.0)
Alkaline Phosphatase: 70 U/L (ref 38–126)
Anion gap: 13 (ref 5–15)
BUN: 19 mg/dL (ref 8–23)
CO2: 21 mmol/L — ABNORMAL LOW (ref 22–32)
Calcium: 7.7 mg/dL — ABNORMAL LOW (ref 8.9–10.3)
Chloride: 107 mmol/L (ref 98–111)
Creatinine, Ser: 1.4 mg/dL — ABNORMAL HIGH (ref 0.44–1.00)
GFR calc Af Amer: 40 mL/min — ABNORMAL LOW (ref >60)
GFR calc non Af Amer: 35 mL/min — ABNORMAL LOW (ref >60)
Glucose, Bld: 146 mg/dL — ABNORMAL HIGH (ref 70–99)
POTASSIUM: 3.1 mmol/L — AB (ref 3.5–5.1)
Sodium: 141 mmol/L (ref 135–145)
Total Bilirubin: 1 mg/dL (ref 0.3–1.2)
Total Protein: 5.8 g/dL — ABNORMAL LOW (ref 6.5–8.1)

## 2018-09-13 LAB — CBC
HCT: 26.2 % — ABNORMAL LOW (ref 36.0–46.0)
Hemoglobin: 8.6 g/dL — ABNORMAL LOW (ref 12.0–15.0)
MCH: 26.2 pg (ref 26.0–34.0)
MCHC: 32.8 g/dL (ref 30.0–36.0)
MCV: 79.9 fL — ABNORMAL LOW (ref 80.0–100.0)
NRBC: 0.4 % — AB (ref 0.0–0.2)
Platelets: 399 10*3/uL (ref 150–400)
RBC: 3.28 MIL/uL — ABNORMAL LOW (ref 3.87–5.11)
RDW: 20.6 % — AB (ref 11.5–15.5)
WBC: 27.5 10*3/uL — ABNORMAL HIGH (ref 4.0–10.5)

## 2018-09-13 LAB — MAGNESIUM: MAGNESIUM: 1.4 mg/dL — AB (ref 1.7–2.4)

## 2018-09-13 LAB — LACTIC ACID, PLASMA: Lactic Acid, Venous: 2.5 mmol/L (ref 0.5–1.9)

## 2018-09-13 LAB — PHOSPHORUS: Phosphorus: 2.8 mg/dL (ref 2.5–4.6)

## 2018-09-13 LAB — GLUCOSE, CAPILLARY: Glucose-Capillary: 107 mg/dL — ABNORMAL HIGH (ref 70–99)

## 2018-09-13 LAB — APTT: APTT: 35 s (ref 24–36)

## 2018-09-13 MED ORDER — FOLIC ACID 1 MG PO TABS
1.0000 mg | ORAL_TABLET | Freq: Every day | ORAL | Status: DC
  Administered 2018-09-15: 1 mg via ORAL
  Filled 2018-09-13: qty 1

## 2018-09-13 MED ORDER — ONDANSETRON HCL 4 MG PO TABS: 4.0000 mg | ORAL_TABLET | Freq: Four times a day (QID) | ORAL | Status: DC | PRN

## 2018-09-13 MED ORDER — PANTOPRAZOLE SODIUM 40 MG IV SOLR
40.0000 mg | Freq: Two times a day (BID) | INTRAVENOUS | Status: DC
  Administered 2018-09-13 – 2018-09-17 (×8): 40 mg via INTRAVENOUS
  Filled 2018-09-13 (×6): qty 40

## 2018-09-13 MED ORDER — POTASSIUM CHLORIDE 10 MEQ/100ML IV SOLN
10.0000 meq | INTRAVENOUS | Status: AC
  Administered 2018-09-14 (×4): 10 meq via INTRAVENOUS
  Filled 2018-09-13 (×4): qty 100

## 2018-09-13 MED ORDER — PIPERACILLIN-TAZOBACTAM 3.375 G IVPB
3.3750 g | Freq: Three times a day (TID) | INTRAVENOUS | Status: DC
  Administered 2018-09-13 – 2018-09-14 (×2): 3.375 g via INTRAVENOUS
  Filled 2018-09-13 (×2): qty 50

## 2018-09-13 MED ORDER — SODIUM CHLORIDE 0.9 % IV SOLN
INTRAVENOUS | Status: DC
  Administered 2018-09-13 – 2018-09-14 (×2): via INTRAVENOUS

## 2018-09-13 MED ORDER — INSULIN ASPART 100 UNIT/ML ~~LOC~~ SOLN: 0.0000 [IU] | SUBCUTANEOUS | Status: DC

## 2018-09-13 MED ORDER — HYDROCODONE-ACETAMINOPHEN 5-325 MG PO TABS: 1.0000 | ORAL_TABLET | ORAL | Status: DC | PRN

## 2018-09-13 MED ORDER — MIRTAZAPINE 15 MG PO TABS
7.5000 mg | ORAL_TABLET | Freq: Every day | ORAL | Status: DC
  Administered 2018-09-13 – 2018-09-15 (×2): 7.5 mg via ORAL
  Filled 2018-09-13 (×3): qty 1

## 2018-09-13 MED ORDER — ACETAMINOPHEN 325 MG PO TABS: 650.0000 mg | ORAL_TABLET | Freq: Four times a day (QID) | ORAL | Status: DC | PRN

## 2018-09-13 MED ORDER — ESCITALOPRAM OXALATE 10 MG PO TABS
10.0000 mg | ORAL_TABLET | Freq: Every day | ORAL | Status: DC
  Administered 2018-09-15: 10 mg via ORAL
  Filled 2018-09-13: qty 1

## 2018-09-13 MED ORDER — DONEPEZIL HCL 10 MG PO TABS
10.0000 mg | ORAL_TABLET | Freq: Every day | ORAL | Status: DC
  Administered 2018-09-13 – 2018-09-15 (×2): 10 mg via ORAL
  Filled 2018-09-13 (×3): qty 1

## 2018-09-13 MED ORDER — VANCOMYCIN HCL IN DEXTROSE 1-5 GM/200ML-% IV SOLN
1000.0000 mg | INTRAVENOUS | Status: DC
  Administered 2018-09-13: 1000 mg via INTRAVENOUS
  Filled 2018-09-13: qty 200

## 2018-09-13 MED ORDER — ALBUMIN HUMAN 5 % IV SOLN
12.5000 g | Freq: Once | INTRAVENOUS | Status: AC
  Administered 2018-09-13: 12.5 g via INTRAVENOUS
  Filled 2018-09-13: qty 250

## 2018-09-13 MED ORDER — ACETAMINOPHEN 650 MG RE SUPP: 650.0000 mg | Freq: Four times a day (QID) | RECTAL | Status: DC | PRN

## 2018-09-13 MED ORDER — CALCIUM GLUCONATE-NACL 1-0.675 GM/50ML-% IV SOLN
1.0000 g | Freq: Once | INTRAVENOUS | Status: AC
  Administered 2018-09-14: 1000 mg via INTRAVENOUS
  Filled 2018-09-13: qty 50

## 2018-09-13 MED ORDER — ONDANSETRON HCL 4 MG/2ML IJ SOLN: 4.0000 mg | Freq: Four times a day (QID) | INTRAMUSCULAR | Status: DC | PRN

## 2018-09-13 NOTE — H&P (Addendum)
Cathy Taylor MBT:[id number] DOB: [birth year] DOA: 09/13/2018     PCP: Maryella Shivers, MD   Outpatient Specialists:    NEurology    Dr. Leonie Man   GI  Dr.  Sadie Haber,  ) Seen by Dr. Michail Sermon as consultant 2 wks ago    Patient arrived to ER on  at   Patient coming from:  From facility [facility]. Skilled  Nursing Facility  Chief Complaint:  hemoglobin low hemoglobin according to SNF  HPI: Cathy Taylor is a 84 y.o. female with medical history significant of CKD, anemia, failure to thrive, DM2, dementia, CVA, depression grade 1 diastolic dysfunction, pacemaker in place    Presented with reported low hemoglobin. apparently patient had blood drawn at SNF for unclear reason showed hemoglobin of 7.1 seems like his been progressively getting lower past few months. Patient herself has dementia and has no complaints. She was told that she needs a blood transfusion and was sent to [facility]. no complaints of chest pain or shortness of breath but  Weeks ago was admitted to [facility] with failure to thrive GI was consulted and EGD was done which showed severe gastritis, gastric ulcers, esophageal ulcer and duodenitis on Oral protonix, carafate added  Family adamantly declines palliative care discussions this time albumin is 1.7 due to Severe protein calorie malnutrition  Regarding pertinent Chronic problems: CKD off of lisinopril given recent AKI on Dec 15 Cr 0.69  Hx of CVA on Aspirin 81 held  History of anemia hemoglobin 8.8 discharge 2 weeks ago  While in ER: At [facility] white blood cell count was 30.2 hemoglobin 9.3 thirds 442 count being neutrophils Potassium noted to be down to 2.7 creatinine up to 1.3 calcium low at 7.7 X-ray showing bilateral pleural effusions and basilar atelectasis  was noted to be on 2 L of oxygen unsure what her baseline is satting 99% on night vital signs was noted to be stable Hemoccult positive as there is no GI coverage at [facility] she was  sent as a transfer to [facility] accepted to telemetry bed Stable GI evaluation.  While a trend of CT of abdomen was done unfortunately results were not available at the time of transfer CT scan resulted and showed The following Work up has been ordered so far:  No orders of the defined types were placed in this encounter.    Following Medications were ordered in ER: Medications - No data to display  Significant initial  Findings: Abnormal Labs Reviewed - No data to display   Lactic Acid, Venous    Component Value Date/Time   LATICACIDVEN 1.1 08/24/2018 2252    Na * K  Cr  * stable,  Up from baseline see below Lab Results  Component Value Date   CREATININE 0.69 09/05/2018   CREATININE 1.02 (H) 09/03/2018   CREATININE 0.95 09/02/2018      WBC  27.5  HG/HCT  stable,   from baseline see below    Component Value Date/Time   HGB 8.8 (L) 09/05/2018 0854   HCT 26.3 (L) 09/05/2018 0854       Troponin (Point of Care Test) No results for input(s): TROPIPOC in the last 72 hours.     BNP (last 3 results) Recent Labs    07/03/18 0447  BNP 733.7*    ProBNP (last 3 results) No results for input(s): PROBNP in the last 8760 hours.     UA not ordered   CXR -  Bibasilar effussions  CTabd/pelvis -  enteritis  ECG:  Not obtained     ED Triage Vitals  Enc Vitals Group     BP      Pulse      Resp      Temp      Temp src      SpO2      Weight      Height      Head Circumference      Peak Flow      Pain Score      Pain Loc      Pain Edu?      Excl. in [place]?   YJEH(63)@       Latest  There were no vitals taken for this visit.   Hospitalist was called for admission for Occult blood in stools   Review of Systems:    Pertinent positives include: fatigue,   Constitutional:  No weight loss, night sweats, Fevers, chills, weight loss  HEENT:  No headaches, Difficulty swallowing,Tooth/dental problems,Sore throat,  No sneezing, itching, ear ache,  nasal congestion, post nasal drip,  Cardio-vascular:  No chest pain, Orthopnea, PND, anasarca, dizziness, palpitations.no Bilateral lower extremity swelling  GI:  No heartburn, indigestion, abdominal pain, nausea, vomiting, diarrhea, change in bowel habits, loss of appetite, melena, blood in stool, hematemesis Resp:  no shortness of breath at rest. No dyspnea on exertion, No excess mucus, no productive cough, No non-productive cough, No coughing up of blood.No change in color of mucus.No wheezing. Skin:  no rash or lesions. No jaundice GU:  no dysuria, change in color of urine, no urgency or frequency. No straining to urinate.  No flank pain.  Musculoskeletal:  No joint pain or no joint swelling. No decreased range of motion. No back pain.  Psych:  No change in mood or affect. No depression or anxiety. No memory loss.  Neuro: no localizing neurological complaints, no tingling, no weakness, no double vision, no gait abnormality, no slurred speech, no confusion  All systems reviewed and apart from [place] all are negative  Past Medical History:   Past Medical History:  Diagnosis Date  . Abdominal hernia   . Anxiety   . Arthritis   . Breast cancer ([place]) 1973   right  . 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 Bronchiectasis 11/12  . Bronchitis       . Chronic back pain greater than 3 months duration   . Depression   . Enteritis 06/30/2018  . GERD (gastroesophageal reflux disease)   . Heart murmur   . Hypercholesteremia   . Hypertension   . Left ventricular dysfunction 04/2015   EF 30% by TEE, + AK mid-apical anteroseptal and anterior walls plus apex, neg bubble study, grade III plaque asc Ao, suspect ICM  . Osteoarthritis of left shoulder 01/06/2012  . PMR (polymyalgia rheumatica) (HCC)   . Stress fracture of left femur with nonunion 07/08/2011  . Stroke ([place])   . Type II diabetes mellitus ([place])   . Umbilical hernia 1/49/70   unrepaired      Past Surgical History:  Procedure  Laterality Date  . BIOPSY  08/26/2018   Procedure: BIOPSY;  Surgeon: Wilford Corner, MD;  Location: [place];  Service: Endoscopy;;  . [place]   left  . CATARACT EXTRACTION W/ INTRAOCULAR LENS IMPLANT  07/2011   left  . EP IMPLANTABLE DEVICE N/A 05/08/2015   Procedure: Loop Recorder Insertion;  Surgeon: Will Meredith Leeds, MD;  Location: [place]  CV LAB;  Service: Cardiovascular;  Laterality: N/A;  . ESOPHAGOGASTRODUODENOSCOPY (EGD) WITH PROPOFOL N/A 08/26/2018   Procedure: ESOPHAGOGASTRODUODENOSCOPY (EGD) WITH PROPOFOL;  Surgeon: Wilford Corner, MD;  Location: [place];  Service: Endoscopy;  Laterality: N/A;  . FEMUR SURGERY  07/07/2011   left rod placed  . MASTECTOMY  1973   right  . SHOULDER ARTHROSCOPY  06/2000   left  . TEE WITHOUT CARDIOVERSION N/A 05/07/2015   Procedure: TRANSESOPHAGEAL ECHOCARDIOGRAM (TEE);  Surgeon: Larey Dresser, MD;  EF 30%, +WMA, no thrombus/PFO, suspect ICM    . TOTAL SHOULDER ARTHROPLASTY  01/06/12   left  . TOTAL SHOULDER ARTHROPLASTY  01/06/2012   Procedure: TOTAL SHOULDER ARTHROPLASTY;  Surgeon: Johnny Bridge, MD;  Location: [place];  Service: Orthopedics;  Laterality: Left;  . TUBAL LIGATION      Social History:   bed bound     reports that she has never smoked. She quit smokeless tobacco use about 70 years ago.  Her smokeless tobacco use included snuff. She reports that she does not drink alcohol or use drugs.     Family History:   Family History  Problem Relation Age of Onset  . Heart attack Father   . Pulmonary fibrosis Sister   . Stroke Son   . Stroke Maternal Aunt     Allergies: No Known Allergies   Prior to Admission medications   Medication Sig Start Date End Date Taking? Authorizing Provider  acetaminophen (TYLENOL) 325 MG tablet Take 650 mg by mouth every 6 (six) hours as needed (for pain).     [provider]  aspirin 81 MG tablet Take 1 tablet (81 mg total) by mouth daily. 04/07/16    Belva Crome, MD  Cholecalciferol (VITAMIN D-3) 1000 units CAPS Take 1,000 Units by mouth daily.    [provider]  donepezil (ARICEPT) 10 MG tablet Take 10 mg by mouth at bedtime.     [provider]  dronabinol (MARINOL) 2.5 MG capsule Take 1 capsule (2.5 mg total) by mouth 2 (two) times daily before lunch and supper. 09/07/18   Domenic Polite, MD  escitalopram (LEXAPRO) 10 MG tablet Take 10 mg by mouth daily.    [provider]  feeding supplement, ENSURE ENLIVE, (ENSURE ENLIVE) LIQD Take 237 mLs by mouth 2 (two) times daily between meals. 09/07/18   Domenic Polite, MD  fenofibrate (TRICOR) 145 MG tablet Take 145 mg by mouth daily. 07/14/18   [provider]  folic acid (FOLVITE) 1 MG tablet Take 1 mg by mouth daily.    [provider]  mirtazapine (REMERON) 7.5 MG tablet Take 1 tablet (7.5 mg total) by mouth at bedtime. 09/07/18   Domenic Polite, MD  ondansetron (ZOFRAN) 4 MG tablet Take 4 mg by mouth every 8 (eight) hours as needed for nausea or vomiting.    [provider]  pantoprazole (PROTONIX) 40 MG tablet Take 1 tablet (40 mg total) by mouth 2 (two) times daily before a meal. 09/07/18   Domenic Polite, MD  sucralfate (CARAFATE) 1 GM/10ML suspension Take 10 mLs (1 g total) by mouth 4 (four) times daily -  with meals and at bedtime. 09/07/18   Domenic Polite, MD  vitamin C (ASCORBIC ACID) 500 MG tablet Take 500 mg by mouth daily.    [provider]  zinc sulfate 220 (50 Zn) MG capsule Take 220 mg by mouth daily.    [provider]   Physical Exam: There were no vitals taken  for this visit. 1. General:  in No Acute distress   Chronically ill cachectic  pale-appearing 2. Psychological: Alert and  Oriented 3. Head/ENT:    Dry Mucous Membranes                          Head Non traumatic, neck supple                            Poor Dentition 4. SKIN:    decreased Skin turgor,  Skin clean Dry and intact no  rash 5. Heart: Regular rate and rhythm no Murmur, no Rub or gallop 6. Lungs no wheezes or crackles   7. Abdomen: Soft, non-tender, Non distended diminished bowel sounds present 8. Lower extremities: no clubbing, cyanosis, trace edema 9. Neurologically Grossly intact,   10. MSK: Normal range of motion   LABS:    No results for input(s): WBC, NEUTROABS, HGB, HCT, MCV, PLT in the last 168 hours. Basic Metabolic Panel: No results for input(s): NA, K, CL, CO2, GLUCOSE, BUN, CREATININE, CALCIUM, MG, PHOS in the last 168 hours.    No results for input(s): AST, ALT, ALKPHOS, BILITOT, PROT, ALBUMIN in the last 168 hours. No results for input(s): LIPASE, AMYLASE in the last 168 hours. No results for input(s): AMMONIA in the last 168 hours.    HbA1C: No results for input(s): HGBA1C in the last 72 hours. 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 Diffuse inflammation of the stomach small bowel and colon suggestive with diffuse gust and enterocolitis possibly secondary to poor blood supply. New ascites pleural effusion and basilar consolidation. Prominent pancreatic duct measuring up to 9.1 mm with pancreatic tail region probably needs a follow-up MRI MRCP  No gallstone mild cardiomegaly coronary artery  calcifications aortic atherosclerosis Chart has been reviewed    Assessment/Plan   84 y.o. female with medical history significant of CKD, anemia, failure to thrive,  DM2, dementia, CVA, depression grade 1 diastolic dysfunction, pacemaker in place Admitted for hemoccult positive stools with findings of SMA occlusion and likely ischemic colitis and new pneumonia  Present on Admission: . Enteritis, ischemic (HCC) -bowel rest initiated IV antibiotics Zosyn and monitor overall unfortunately poor prognosis given severe chronic disease resulting in ulcerations and chronic blood loss.  Discussed with general surgery feels at this point patient not a candidate for surgical intervention.  Can discuss with GI in a.m. to see if there is any benefit from repeat endoscopy. Continue Protonix. Monitor CBC and see if patient needs blood transfusion Family would like to think over palliative care consult they would like to have this readdressed tomorrow and then possibly as an outpatient  Given significant leukocytosis elevated lactic acid would  benefit from IV antibiotics for now . Chronic intestinal ischemia (HCC) -chronic not a candidate for aspirin given ongoing GI blood loss . CKD (chronic kidney disease) stage 3, GFR 30-59 ml/min (HCC) avoid nephrotoxic medications currently appears to have worsening renal failure . AKI (acute kidney injury) ([place]) lisinopril continues to be on hold gentle rehydration patient likely will have recurrent dehydration secondary to decreased p.o. intake secondary to chronic bowel ischemia overall poor prognosis long-term rehydrate for right now patient will benefit strongly from palliative care consult  . Dementia ([place])  - chronic monitor for any signs of sundowning . Failure to thrive in adult -secondary to chronic bowel ischemia order nutritional consult suspect will continue to progress . HLD (hyperlipidemia) -stable chronic resume home medications when able . HTN  (hypertension) -currently not on blood pressure medications we will continue hold . Protein-calorie malnutrition, severe -given significant hypoalbuminemia with intravascular depletion with peripheral edema resulting in pleural effusion will administer albumin to facilitate rehydration     . HCAP (healthcare-associated pneumonia) initiated Zosyn and vancomycin as patient has been recently hospitalized obtain sputum cultures . Debility chronic at baseline bedbound.  PT OT as can tolerate at outpatient facility . Chronic anemia currently hemoglobin at baseline continue to monitor no acute indication for blood transfusion at that time   . Pressure injury of skin order wound care consult . Hypokalemia - - will replace and repeat in AM,  check magnesium level and replace as needed  . Hypocalcemia-  will replace  DM 2-  - Order Sensitive  SSI     -  check TSH and HgA1C Diet managed at baseline  History of CVA aspirin on hold  Other plan as per orders.  DVT prophylaxis:  SCD      Code Status:   DNR/DNI   as per family  I had personally discussed CODE STATUS with patient and family   Family Communication:   Family  at  Bedside  plan of care was discussed with  Daughters,   Disposition Plan:                            Back to current facility when stable                                              Nutrition    consulted                  Wound care  consulted                           Consults called: Discussed with Vasc surgery Dr. Donnetta Hutching not a surgical candidate overall poor prognosis   Admission status:  Obs    Level of care      tele  For 12H       Cathy Taylor 09/13/2018, 9:18 PM    [facility]  Pager [phone]   after 2 AM please page floor coverage PA If 7AM-7PM, please contact the day team taking care of the patient  [email or web address]  Password [email or web address]

## 2018-09-13 NOTE — Telephone Encounter (Signed)
I left a voicemail for the pt to send a manual transmission.

## 2018-09-13 NOTE — Telephone Encounter (Signed)
Called and spoke with patients daughter [name], she is aware that a manual transmission needs to be sent. I went over step by step instructions with [name], she states that she will do this later when her mother gets home. [name] is aware that if she has any questions to call back and that if there are any episodes that need to be discussed a nurse will call her back.

## 2018-09-13 NOTE — Progress Notes (Signed)
Pharmacy Antibiotic Note  Cathy Taylor is a 84 y.o. female admitted on 09/13/2018 with intraabdominal infection.  Pharmacy has been consulted for Zosyn dosing.  Plan: Zosyn 3.375gm IV q8h (4hr extended infusions) No dose adjustments needed, Pharmacy will sign off  Height: 5\' 3"  (160 cm) Weight: 127 lb (57.6 kg) IBW/kg (Calculated) : 52.4  No data recorded.  Recent Labs  Lab 09/13/18 2009  WBC 27.5*    Estimated Creatinine Clearance: 44.1 mL/min (by C-G formula based on SCr of 0.69 mg/dL).    No Known Allergies  Antimicrobials this admission: 12/23 Zosyn >>  Dose adjustments this admission: None  Microbiology results: None  Thank you for allowing pharmacy to be a part of this patient's care.  Peggyann Juba, PharmD, BCPS Pager: [phone] 09/13/2018 8:38 PM

## 2018-09-13 NOTE — Progress Notes (Signed)
Paged Dr. Roel Cluck with critical lactic acid result of 2.5

## 2018-09-14 ENCOUNTER — Observation Stay: Payer: Self-pay

## 2018-09-14 ENCOUNTER — Observation Stay (HOSPITAL_COMMUNITY): Payer: Medicare Other

## 2018-09-14 DIAGNOSIS — J9811 Atelectasis: Secondary | ICD-10-CM | POA: Diagnosis present

## 2018-09-14 DIAGNOSIS — I429 Cardiomyopathy, unspecified: Secondary | ICD-10-CM | POA: Diagnosis not present

## 2018-09-14 DIAGNOSIS — L89620 Pressure ulcer of left heel, unstageable: Secondary | ICD-10-CM | POA: Diagnosis present

## 2018-09-14 DIAGNOSIS — E1122 Type 2 diabetes mellitus with diabetic chronic kidney disease: Secondary | ICD-10-CM | POA: Diagnosis present

## 2018-09-14 DIAGNOSIS — K559 Vascular disorder of intestine, unspecified: Secondary | ICD-10-CM | POA: Diagnosis not present

## 2018-09-14 DIAGNOSIS — D62 Acute posthemorrhagic anemia: Secondary | ICD-10-CM | POA: Diagnosis not present

## 2018-09-14 DIAGNOSIS — G301 Alzheimer's disease with late onset: Secondary | ICD-10-CM | POA: Diagnosis not present

## 2018-09-14 DIAGNOSIS — N179 Acute kidney failure, unspecified: Secondary | ICD-10-CM | POA: Diagnosis present

## 2018-09-14 DIAGNOSIS — E860 Dehydration: Secondary | ICD-10-CM | POA: Diagnosis present

## 2018-09-14 DIAGNOSIS — K2210 Ulcer of esophagus without bleeding: Secondary | ICD-10-CM | POA: Diagnosis present

## 2018-09-14 DIAGNOSIS — N390 Urinary tract infection, site not specified: Secondary | ICD-10-CM | POA: Diagnosis present

## 2018-09-14 DIAGNOSIS — F329 Major depressive disorder, single episode, unspecified: Secondary | ICD-10-CM | POA: Diagnosis present

## 2018-09-14 DIAGNOSIS — L89100 Pressure ulcer of unspecified part of back, unstageable: Secondary | ICD-10-CM | POA: Diagnosis present

## 2018-09-14 DIAGNOSIS — E1159 Type 2 diabetes mellitus with other circulatory complications: Secondary | ICD-10-CM | POA: Diagnosis not present

## 2018-09-14 DIAGNOSIS — K210 Gastro-esophageal reflux disease with esophagitis: Secondary | ICD-10-CM | POA: Diagnosis present

## 2018-09-14 DIAGNOSIS — E872 Acidosis: Secondary | ICD-10-CM | POA: Diagnosis present

## 2018-09-14 DIAGNOSIS — R5381 Other malaise: Secondary | ICD-10-CM | POA: Diagnosis not present

## 2018-09-14 DIAGNOSIS — E785 Hyperlipidemia, unspecified: Secondary | ICD-10-CM | POA: Diagnosis present

## 2018-09-14 DIAGNOSIS — D638 Anemia in other chronic diseases classified elsewhere: Secondary | ICD-10-CM | POA: Diagnosis present

## 2018-09-14 DIAGNOSIS — F0150 Vascular dementia without behavioral disturbance: Secondary | ICD-10-CM | POA: Diagnosis present

## 2018-09-14 DIAGNOSIS — N183 Chronic kidney disease, stage 3 (moderate): Secondary | ICD-10-CM | POA: Diagnosis present

## 2018-09-14 DIAGNOSIS — K55019 Acute (reversible) ischemia of small intestine, extent unspecified: Secondary | ICD-10-CM | POA: Diagnosis present

## 2018-09-14 DIAGNOSIS — E43 Unspecified severe protein-calorie malnutrition: Secondary | ICD-10-CM | POA: Diagnosis present

## 2018-09-14 DIAGNOSIS — L89610 Pressure ulcer of right heel, unstageable: Secondary | ICD-10-CM | POA: Diagnosis present

## 2018-09-14 DIAGNOSIS — D649 Anemia, unspecified: Secondary | ICD-10-CM | POA: Diagnosis not present

## 2018-09-14 DIAGNOSIS — E878 Other disorders of electrolyte and fluid balance, not elsewhere classified: Secondary | ICD-10-CM | POA: Diagnosis present

## 2018-09-14 DIAGNOSIS — I1310 Hypertensive heart and chronic kidney disease without heart failure, with stage 1 through stage 4 chronic kidney disease, or unspecified chronic kidney disease: Secondary | ICD-10-CM | POA: Diagnosis present

## 2018-09-14 DIAGNOSIS — J90 Pleural effusion, not elsewhere classified: Secondary | ICD-10-CM | POA: Diagnosis present

## 2018-09-14 DIAGNOSIS — R627 Adult failure to thrive: Secondary | ICD-10-CM | POA: Diagnosis present

## 2018-09-14 LAB — BLOOD GAS, ARTERIAL
Acid-base deficit: 1.7 mmol/L (ref 0.0–2.0)
Bicarbonate: 21.5 mmol/L (ref 20.0–28.0)
Drawn by: 232811
O2 Content: 15 L/min
O2 Saturation: 99.8 %
Patient temperature: 97.6
pCO2 arterial: 30.2 mmHg — ABNORMAL LOW (ref 32.0–48.0)
pH, Arterial: 7.463 — ABNORMAL HIGH (ref 7.350–7.450)
pO2, Arterial: 343 mmHg — ABNORMAL HIGH (ref 83.0–108.0)

## 2018-09-14 LAB — PHOSPHORUS: PHOSPHORUS: 2.1 mg/dL — AB (ref 2.5–4.6)

## 2018-09-14 LAB — CBC
HCT: 24.4 % — ABNORMAL LOW (ref 36.0–46.0)
Hemoglobin: 7.6 g/dL — ABNORMAL LOW (ref 12.0–15.0)
MCH: 26 pg (ref 26.0–34.0)
MCHC: 31.1 g/dL (ref 30.0–36.0)
MCV: 83.6 fL (ref 80.0–100.0)
NRBC: 0.3 % — AB (ref 0.0–0.2)
PLATELETS: 306 10*3/uL (ref 150–400)
RBC: 2.92 MIL/uL — ABNORMAL LOW (ref 3.87–5.11)
RDW: 21.2 % — ABNORMAL HIGH (ref 11.5–15.5)
WBC: 23.7 10*3/uL — AB (ref 4.0–10.5)

## 2018-09-14 LAB — COMPREHENSIVE METABOLIC PANEL
ALT: 11 U/L (ref 0–44)
AST: 17 U/L (ref 15–41)
Albumin: 2.1 g/dL — ABNORMAL LOW (ref 3.5–5.0)
Alkaline Phosphatase: 52 U/L (ref 38–126)
Anion gap: 13 (ref 5–15)
BUN: 18 mg/dL (ref 8–23)
CO2: 20 mmol/L — ABNORMAL LOW (ref 22–32)
Calcium: 7.7 mg/dL — ABNORMAL LOW (ref 8.9–10.3)
Chloride: 108 mmol/L (ref 98–111)
Creatinine, Ser: 1.18 mg/dL — ABNORMAL HIGH (ref 0.44–1.00)
GFR calc Af Amer: 49 mL/min — ABNORMAL LOW (ref >60)
GFR, EST NON AFRICAN AMERICAN: 43 mL/min — AB (ref >60)
Glucose, Bld: 106 mg/dL — ABNORMAL HIGH (ref 70–99)
Potassium: 3.6 mmol/L (ref 3.5–5.1)
Sodium: 141 mmol/L (ref 135–145)
TOTAL PROTEIN: 5 g/dL — AB (ref 6.5–8.1)
Total Bilirubin: 0.9 mg/dL (ref 0.3–1.2)

## 2018-09-14 LAB — URINALYSIS, ROUTINE W REFLEX MICROSCOPIC
Bilirubin Urine: NEGATIVE
Glucose, UA: NEGATIVE mg/dL
KETONES UR: NEGATIVE mg/dL
Nitrite: NEGATIVE
PROTEIN: 30 mg/dL — AB
Specific Gravity, Urine: 1.043 — ABNORMAL HIGH (ref 1.005–1.030)
WBC, UA: >50 WBC/hpf — ABNORMAL HIGH (ref 0–5)
pH: 6 (ref 5.0–8.0)

## 2018-09-14 LAB — MAGNESIUM: Magnesium: 1.3 mg/dL — ABNORMAL LOW (ref 1.7–2.4)

## 2018-09-14 LAB — MRSA PCR SCREENING: MRSA by PCR: NEGATIVE

## 2018-09-14 LAB — GLUCOSE, CAPILLARY
GLUCOSE-CAPILLARY: 29 mg/dL — AB (ref 70–99)
GLUCOSE-CAPILLARY: 200 mg/dL — AB (ref 70–99)
Glucose-Capillary: 102 mg/dL — ABNORMAL HIGH (ref 70–99)
Glucose-Capillary: 118 mg/dL — ABNORMAL HIGH (ref 70–99)
Glucose-Capillary: 133 mg/dL — ABNORMAL HIGH (ref 70–99)
Glucose-Capillary: 27 mg/dL — CL (ref 70–99)
Glucose-Capillary: 33 mg/dL — CL (ref 70–99)
Glucose-Capillary: 48 mg/dL — ABNORMAL LOW (ref 70–99)
Glucose-Capillary: 65 mg/dL — ABNORMAL LOW (ref 70–99)
Glucose-Capillary: 82 mg/dL (ref 70–99)
Glucose-Capillary: 96 mg/dL (ref 70–99)

## 2018-09-14 LAB — PREALBUMIN: Prealbumin: <5 mg/dL — ABNORMAL LOW (ref 18–38)

## 2018-09-14 LAB — PROCALCITONIN: Procalcitonin: 0.34 ng/mL

## 2018-09-14 LAB — CREATININE, URINE, RANDOM: Creatinine, Urine: 74.4 mg/dL

## 2018-09-14 LAB — PREPARE RBC (CROSSMATCH)

## 2018-09-14 LAB — SODIUM, URINE, RANDOM: Sodium, Ur: 20 mmol/L

## 2018-09-14 LAB — TSH: TSH: 3.661 u[IU]/mL (ref 0.350–4.500)

## 2018-09-14 LAB — ABO/RH: ABO/RH(D): O POS

## 2018-09-14 LAB — STREP PNEUMONIAE URINARY ANTIGEN: Strep Pneumo Urinary Antigen: NEGATIVE

## 2018-09-14 MED ORDER — SODIUM CHLORIDE 0.9% FLUSH
10.0000 mL | INTRAVENOUS | Status: DC | PRN
  Administered 2018-09-14: 10 mL
  Administered 2018-09-15: 20 mL
  Administered 2018-09-18: 10 mL
  Filled 2018-09-14 (×3): qty 40

## 2018-09-14 MED ORDER — SODIUM CHLORIDE 0.9% FLUSH
10.0000 mL | Freq: Two times a day (BID) | INTRAVENOUS | Status: DC
  Administered 2018-09-15 – 2018-09-16 (×3): 10 mL

## 2018-09-14 MED ORDER — DEXTROSE 50 % IV SOLN
1.0000 | Freq: Once | INTRAVENOUS | Status: AC
  Administered 2018-09-14: 50 mL via INTRAVENOUS

## 2018-09-14 MED ORDER — MAGNESIUM SULFATE 2 GM/50ML IV SOLN: 2.0000 g | Freq: Once | INTRAVENOUS | Status: DC

## 2018-09-14 MED ORDER — SODIUM CHLORIDE 0.9% IV SOLUTION
Freq: Once | INTRAVENOUS | Status: AC
  Administered 2018-09-14: 17:00:00 via INTRAVENOUS

## 2018-09-14 MED ORDER — INSULIN ASPART 100 UNIT/ML ~~LOC~~ SOLN: 0.0000 [IU] | SUBCUTANEOUS | Status: DC

## 2018-09-14 MED ORDER — DEXTROSE 50 % IV SOLN
25.0000 g | INTRAVENOUS | Status: AC
  Administered 2018-09-14: 25 g via INTRAVENOUS

## 2018-09-14 MED ORDER — DEXTROSE 10 % IV SOLN
INTRAVENOUS | Status: AC
  Administered 2018-09-14 – 2018-09-15 (×2): via INTRAVENOUS

## 2018-09-14 MED ORDER — GERHARDT'S BUTT CREAM
TOPICAL_CREAM | Freq: Three times a day (TID) | CUTANEOUS | Status: DC
  Administered 2018-09-14: 17:00:00 via TOPICAL
  Administered 2018-09-14: 1 via TOPICAL
  Administered 2018-09-14 – 2018-09-18 (×6): via TOPICAL
  Filled 2018-09-14 (×3): qty 1

## 2018-09-14 MED ORDER — DEXTROSE-NACL 10-0.45 % IV SOLN: INTRAVENOUS | Status: DC

## 2018-09-14 MED ORDER — FUROSEMIDE 10 MG/ML IJ SOLN
20.0000 mg | Freq: Once | INTRAMUSCULAR | Status: AC
  Administered 2018-09-14: 20 mg via INTRAVENOUS
  Filled 2018-09-14: qty 2

## 2018-09-14 MED ORDER — POTASSIUM CHLORIDE CRYS ER 20 MEQ PO TBCR
40.0000 meq | EXTENDED_RELEASE_TABLET | Freq: Once | ORAL | Status: AC
  Administered 2018-09-14: 40 meq via ORAL
  Filled 2018-09-14: qty 2

## 2018-09-14 MED ORDER — DEXTROSE-NACL 5-0.45 % IV SOLN: INTRAVENOUS | Status: DC

## 2018-09-14 MED ORDER — POTASSIUM CHLORIDE 10 MEQ/100ML IV SOLN: 10.0000 meq | INTRAVENOUS | Status: DC

## 2018-09-14 MED ORDER — DEXTROSE 50 % IV SOLN
INTRAVENOUS | Status: AC
  Administered 2018-09-14: 08:00:00
  Filled 2018-09-14: qty 50

## 2018-09-14 MED ORDER — MAGNESIUM OXIDE 400 (241.3 MG) MG PO TABS
800.0000 mg | ORAL_TABLET | ORAL | Status: AC
  Administered 2018-09-14 – 2018-09-15 (×2): 800 mg via ORAL
  Filled 2018-09-14 (×2): qty 2

## 2018-09-14 MED ORDER — DEXTROSE 50 % IV SOLN
INTRAVENOUS | Status: AC
  Administered 2018-09-14: 18:00:00
  Filled 2018-09-14: qty 50

## 2018-09-14 MED ORDER — PIPERACILLIN-TAZOBACTAM 3.375 G IVPB 30 MIN
3.3750 g | Freq: Four times a day (QID) | INTRAVENOUS | Status: DC
  Administered 2018-09-14 – 2018-09-18 (×13): 3.375 g via INTRAVENOUS
  Filled 2018-09-14 (×29): qty 50

## 2018-09-14 MED ORDER — PIPERACILLIN-TAZOBACTAM 3.375 G IVPB: 3.3750 g | Freq: Four times a day (QID) | INTRAVENOUS | Status: DC

## 2018-09-14 NOTE — Progress Notes (Signed)
PT Cancellation Note  Patient Details Name: Cathy Taylor MRN: [id number] DOB: 1933/01/16   Cancelled Treatment:    Reason Eval/Treat Not Completed: Medical issues which prohibited therapy, to get blood and a PICC. Patient from SNF.    Claretha Cooper 09/14/2018, 2:08 PM [place] Pager [phone] Office [phone]

## 2018-09-14 NOTE — Progress Notes (Signed)
Pharmacy Antibiotic Note  Cathy Taylor is a 84 y.o. female admitted on 09/13/2018 with intraabdominal infection.  Pharmacy has been consulted for Zosyn dosing.  Assessment: -WBC 23.7, elevated -SCr 1.18, CrCl ~30 mL/min -Single lumen PICC placed 12/24.   Received request to change from zosyn extended infusion to traditional dosing due to limited IV access.  Plan:  Discontinue piperacillin/tazobactam 3.375 g IV q8h EI   Initiate piperacillin/tazobactam 3.375 g IV q6h infused over 30 minutes.  Height: 5\' 3"  (160 cm) Weight: 127 lb (57.6 kg) IBW/kg (Calculated) : 52.4  Temp (24hrs), Avg:96.5 F (35.8 C), Min:94.1 F (34.5 C), Max:97.6 F (36.4 C)  Recent Labs  Lab 09/13/18 2009 09/14/18 0548  WBC 27.5* 23.7*  CREATININE 1.40* 1.18*  LATICACIDVEN 2.5*  --     Estimated Creatinine Clearance: 29.9 mL/min (A) (by C-G formula based on SCr of 1.18 mg/dL (H)).    No Known Allergies  Antimicrobials this admission: 12/23 Zosyn >>  Dose adjustments this admission: -12/24: Changed from zosyn 3.375 g IV q8h EI to 3.375 g IV q6h   Microbiology results: -12/23: Blood: no growth < 12 hrs -12/23: MRSA PCR negative  Thank you for allowing pharmacy to be a part of this patient's care.  Lenis Noon, PharmD 09/14/18 7:25 PM

## 2018-09-14 NOTE — Progress Notes (Signed)
More warm blankets applied, hot packs placed under arms and torso, rectal temp 97.0 F oncall provider paged.

## 2018-09-14 NOTE — Progress Notes (Signed)
Lamar Blinks, NP notified of ABG results. Pt currently has pulse oximeter probe on left earlobe and sating at 100% on 3LNC. Pt only had 134ml urine output throughout shift. 2 large bloody BMs with frank blood.

## 2018-09-14 NOTE — Progress Notes (Signed)
Pt care resumed at 1900, unit of blood administered IV team paged to flush single lumen, contacted ICU for IV fluids ordered Dex 10%. Pt axillary temp  93.3, cbg 200 at this time. On call provider paged, warm blankets applied, heat packs.

## 2018-09-14 NOTE — Consult Note (Signed)
[place] Nurse wound consult note Reason for Consult: Bilateral heel Unstageable pressure injuries, right lateral foot full thickness wound with dry wound bed, incontinence associated dermatitis (IAD) to the buttocks, perineum and medial thighs, Partial thickness skin loss to right buttock due to IAD. Bedside RN reports labile glucoses, decrease urine output and GI bleed. Wound type: pressure, moisture Pressure Injury POA: Yes Measurement: Bilateral heel eschars: L>R.  Left measures 4cm x 5cm.  Right measures 2cm x 1.6cm.  Right lateral foot full thickness wound measures 1.2cm x 0.8cm x 0.4cm with dry pink and yellow wound bed.  Partial thickness skin loss on right buttock measures 2cm x 2.2cm x 0.1cm with pink, moist wound bed. Wound bed: As described above Drainage (amount, consistency, odor): scant drainage (serous) from right buttock. All other wounds are dry.  Periwound: intact, dry Dressing procedure/placement/frequency: I will provide a mattress replacement with low air loss feature and bilateral heel pressure redistribution boots today.  Guidance has been provided for Nursing via the Orders for turning and repositioning as well as skin care.  The bilateral heel eschars (L>R) will be treated with twice daily betadine painting to continue the drying process until the eschar lifts. The lateral right foot wound will also be treated with twice daily betadine painting as she likely has some degree of PAD and this location is best served with drying and provision of an antimicrobial. We will use a compounded cream for the buttocks, perineal area and medial thighs, Gerhart's Butt Cream (a 1:1:1 hydrocortisone, lotrimin [antifungal], zinc oxide).   [place] nursing team will not follow, but will remain available to this patient, the nursing and medical teams.  Please re-consult if needed. Thanks, Maudie Flakes, MSN, RN, [place], [facility]  Pager# [phone]

## 2018-09-14 NOTE — Progress Notes (Signed)
NT checked patient's blood sugar this morning and it was 27. After giving an AMP of D50 the blood sugar recheck was 33. Another AMP of D50 was given and the recheck was 82. Will continue to monitor patient.

## 2018-09-14 NOTE — Progress Notes (Signed)
Asked to start an I.V. in left arm in order to move current I.V. on R side due to mastectomy. Unable to find appropriate vein with or without ultrasound for the multiple K+ runs, vanc,zosyn etc., patient receiving. Started one PIV on R arm and one I.V. in L arm to get patient through the night. RN aware of the situation and the recommendation for a PICC line.

## 2018-09-14 NOTE — Progress Notes (Signed)
Cathy Blinks, NP notified RT at bedside, pt on non-rebreather due to cyanotic fingertips and poor pleth with third-spacing reading sat of 77%. EKG obtained with NSR and low QRS voltage.

## 2018-09-14 NOTE — Progress Notes (Signed)
Assessed left upper arm for PICC placement, no veins large enough to accommodate 5 fr double lumen PICC. Right arm restricted secondary to mastectomy. Spoke with Dr. Reesa Chew with findings, wants 4 fr single lumen placed.

## 2018-09-14 NOTE — Progress Notes (Signed)
Patient had a second hypoglycemic episode BS was 29, gave 1 amp of D50 recheck was 65. Patient was able to drink 1 carton of apple juice, rechecked BS and it was 48. Gave a second amp of D50 and recheck BS was 113. MD was made aware and put orders in for patient to start on Dextrose 10 IV fluids continuously.   Patient is currently getting her first unit of blood and then will be able to be started on IV fluids due to one access (single lumen PICC line). MD was made aware and wanted RN to let nightshift RN know that once 1st unit of blood was finished to go ahead and start patient on the dextrose fluids since he only put in for one liter and then once that's completed then do the second unit of blood. Dextrose and Zosyn are not compatible so RN let night shift RN know that she will have to stop the dextrose and hang with normal saline and switch back and forth.   Patient's temperature was also 94.1 axillary, MD was made aware and just advised to put warm blankets on patient and continue to monitor patient's temp. Last temp recorded was 95.1 axillary, heat packs and warm blankets were changed out. Patient   Patient also only had 100 cc of urine last night, bladder scan this morning showed 266 and MD said to let him know if bladder scan was over 350 and then we will do an in-and-out cath. Bladder scan around 6pm showed 354 so RN did the first in-and-out cath and got 350cc. MD was made aware. Urine was very dark in color, malodorous, and cloudy.   MD is aware of patient's condition. Night shift RN was updated on patient.

## 2018-09-14 NOTE — Progress Notes (Signed)
Initial Nutrition Assessment  DOCUMENTATION CODES:   Severe malnutrition in context of chronic illness  INTERVENTION:   -Once diet advanced: provide Magic cup TID with meals, each supplement provides 290 kcal and 9 grams of protein -Provide Juven Fruit Punch BID, each serving provides 95kcal and 2.5g of protein (amino acids glutamine and arginine)  NUTRITION DIAGNOSIS:   Severe Malnutrition related to chronic illness(dementia, chronic intestinal ischemia) as evidenced by percent weight loss, energy intake < or equal to 75% for > or equal to 1 month, moderate fat depletion, severe muscle depletion.  GOAL:   Patient will meet greater than or equal to 90% of their needs  MONITOR:   Diet advancement, Weight trends, Labs, I & O's, Skin  REASON FOR ASSESSMENT:   Consult Assessment of nutrition requirement/status  ASSESSMENT:   84 y.o. female with medical history significant of CKD, anemia, failure to thrive, DM2, dementia, CVA, depression grade 1 diastolic dysfunction, pacemaker in place  Patient sleeping in room, difficult to keep awake during visit. Pt kept falling back asleep. Not able to gather any nutrition history. Pt currently NPO, on bowel rest.   Patient was recently admitted at [facility], seen by nutrition staff there. Pt was ordered [employer] and diagnosed with severe malnutrition. Pt has poor intakes during that admission 12/3-12/17.   Per previous encounters, pt does not like Ensure supplements. Recommend once diet is advanced, order Magic cups and Juven BID for bilateral heel pressure injuries.  Per weight records, pt has lost 21 lb since 8/1 (14% wt loss x 4.5 months, significant for time frame).  Medications: IV N02, Folic acid tablet daily, Remeron tablet daily Labs reviewed: Low Mg, Phos GFR: 43  NUTRITION - FOCUSED PHYSICAL EXAM:    Most Recent Value  Orbital Region  Mild depletion  Upper Arm Region  Moderate depletion  Thoracic and Lumbar Region  Moderate  depletion  Buccal Region  Mild depletion  Temple Region  Mild depletion  Clavicle Bone Region  Moderate depletion  Clavicle and Acromion Bone Region  Moderate depletion  Scapular Bone Region  Unable to assess  Dorsal Hand  Mild depletion  Patellar Region  Severe depletion  Anterior Thigh Region  Severe depletion  Posterior Calf Region  Severe depletion  Edema (RD Assessment)  Moderate       Diet Order:   Diet Order            Diet NPO time specified Except for: Sips with Meds  Diet effective now              EDUCATION NEEDS:   Not appropriate for education at this time  Skin:  Skin Integrity Issues:: Unstageable Unstageable: bilateral heels per WOC note  Last BM:  12/23  Height:   Ht Readings from Last 1 Encounters:  09/13/18 5\' 3"  (1.6 m)    Weight:   Wt Readings from Last 1 Encounters:  09/13/18 57.6 kg    Ideal Body Weight:  52.3 kg  BMI:  Body mass index is 22.5 kg/m.  Estimated Nutritional Needs:   Kcal:  7253-6644  Protein:  75-85g  Fluid:  1.6L/day  Clayton Bibles, MS, RD, LDN [place] [facility] Pager: [phone] After Hours Pager: [phone]

## 2018-09-14 NOTE — Progress Notes (Signed)
Peripherally Inserted Central Catheter/Midline Placement  The IV Nurse has discussed with the patient and/or persons authorized to consent for the patient, the purpose of this procedure and the potential benefits and risks involved with this procedure.  The benefits include less needle sticks, lab draws from the catheter, and the patient may be discharged home with the catheter. Risks include, but not limited to, infection, bleeding, blood clot (thrombus formation), and puncture of an artery; nerve damage and irregular heartbeat and possibility to perform a PICC exchange if needed/ordered by physician.  Alternatives to this procedure were also discussed.  Bard Power PICC patient education guide, fact sheet on infection prevention and patient information card has been provided to patient /or left at bedside.    PICC/Midline Placement Documentation  PICC Single Lumen 09/14/18 PICC Left Brachial 43 cm 4 cm (Active)  Indication for Insertion or Continuance of Line Poor Vasculature-patient has had multiple peripheral attempts or PIVs lasting less than 24 hours 09/14/2018  4:00 PM  Exposed Catheter (cm) 4 cm 09/14/2018  4:00 PM  Site Assessment Clean;Dry;Intact 09/14/2018  4:00 PM  Line Status Flushed;Blood return noted 09/14/2018  4:00 PM  Dressing Type Transparent 09/14/2018  4:00 PM  Dressing Status Clean;Dry;Intact;Antimicrobial disc in place 09/14/2018  4:00 PM  Dressing Change Due 09/21/18 09/14/2018  4:00 PM       Jule Economy Horton 09/14/2018, 4:48 PM

## 2018-09-14 NOTE — Progress Notes (Addendum)
PROGRESS NOTE    Cathy Taylor  YQM:[id number] DOB: [birth year] DOA: 09/13/2018 PCP: Maryella Shivers, MD   Brief Narrative:  84 year old with history of CKD, anemia, failure to thrive, diabetes mellitus type 2, grade 1 diastolic dysfunction, depression, pacemaker in place admitted for low hemoglobin of 7.1.  Transferred from [place] for further care.  Had EGD done about a week ago showed severe gastritis, gastric ulcer, esophageal ulcer and duodenitis.  CT showed concerns for ischemic enteritis and SMA occlusion.  Started on broad-spectrum antibiotics.  Vascular surgery-not a surgical candidate.   Assessment & Plan:   Active Problems:   HTN (hypertension)   Diabetes mellitus ([place])   Cardiomyopathy ([place])   Hypokalemia   Debility   History of stroke   HLD (hyperlipidemia)   Vascular dementia with behavior disturbance (HCC)   Failure to thrive in adult   Chronic anemia   CKD (chronic kidney disease) stage 3, GFR 30-59 ml/min (HCC)   Dementia (HCC)   Protein-calorie malnutrition, severe   Pressure injury of skin   Chronic intestinal ischemia (HCC)   Enteritis, ischemic (HCC)   Ischemic enteritis (HCC)   HCAP (healthcare-associated pneumonia)   Hypocalcemia   AKI (acute kidney injury) ([place])  GI bleed, occult- also related?  Versus ischemic gut? Acute ischemic enteritis -Continue broad-spectrum antibiotics IV vancomycin and Zosyn.  Not a surgical candidate per surgery -Continue PPI.  Diet as tolerated-will advance slowly -Supportive care, transfusion as needed -Not a candidate for aspirin due to ongoing GI bleed  UTI with hematuria -Continue Zosyn, will discontinue vancomycin.  Procalcitonin is pretty much negative.  Acute kidney injury on CKD stage III -Avoid nephrotoxic drugs  Anemia, likely chronic disease Generalized weakness -We will order 2 units of PRBC transfusion.  Will likely need PICC line given difficult stick. -PT/OT due to weakness  Advanced  dementia Failure to thrive with severe protein calorie malnutrition -Provide supportive care.  Encourage oral intake.  Low albumin is causing third spacing. -PT/OT -Prealbumin level less than 5 -Nutrition team following  Hyperlipidemia -Continue home meds statin  Essential hypertension - Medications on hold for now.  Diabetes mellitus type 2 with hyperglycemia secondary to poor oral intake -Insulin sliding scale  History of CVA -Aspirin on hold  Multiple pressure ulcers, unstageable - Routine care per nursing staff.  Goals of care discussion-I spoke with the team, she understands that overall patient has poor prognosis given her comorbidities and advanced age.  This is further complicated by poor oral intake.  Her prealbumin levels are significantly low. They will continue to follow with outpatient palliative care team and consider transitioning her to more comfort care once all the siblings are met together.  DVT prophylaxis: SCDs Code Status: DNR/DNI Family Communication: Spoke with the patient daughter Jeannene Patella Disposition Plan: Maintain inpatient status patient is going to need blood.  Consultants:   None  Procedures:   None  Antimicrobials:   Vanc Zosyn   Subjective: Patient denies any complaints.  She is pleasantly demented.  Episode of hypoglycemia this morning  I spoke with the patient's daughter Jeannene Patella over the phone regarding her overall care.  She showed great concern regarding patient's long-term care and she is aware that she is overall poor prognosis especially in the setting of worsening nutritional status.  This will also prevent from further ulcer healing process.  Review of Systems Otherwise negative except as per HPI, including: General = no fevers, chills, dizziness, malaise, fatigue HEENT/EYES = negative for pain, redness, loss of vision, double  vision, blurred vision, loss of hearing, sore throat, hoarseness, dysphagia Cardiovascular= negative for  chest pain, palpitation, murmurs, lower extremity swelling Respiratory/lungs= negative for shortness of breath, cough, hemoptysis, wheezing, mucus production Gastrointestinal= negative for nausea, vomiting,, abdominal pain, melena, hematemesis Genitourinary= negative for Dysuria, Hematuria, Change in Urinary Frequency MSK = Negative for arthralgia, myalgias, Back Pain, Joint swelling  Neurology= Negative for headache, seizures, numbness, tingling  Psychiatry= Negative for anxiety, depression, suicidal and homocidal ideation Allergy/Immunology= Medication/Food allergy as listed  Skin= Negative for Rash, lesions, ulcers, itching   Objective: Vitals:   09/14/18 0027 09/14/18 0443 09/14/18 0450 09/14/18 0542  BP: (!) 155/80 138/71    Pulse: (!) 58 76    Resp: 16 16    Temp: 97.6 F (36.4 C) 97.6 F (36.4 C)    TempSrc: Oral Oral    SpO2: 97% (!) 76% (!) 86% 100%  Weight:      Height:        Intake/Output Summary (Last 24 hours) at 09/14/2018 0829 Last data filed at 09/14/2018 0536 Gross per 24 hour  Intake 615.52 ml  Output 100 ml  Net 515.52 ml   Filed Weights   09/13/18 2001 09/13/18 2038  Weight: 57.6 kg 57.6 kg    Examination:  General exam: Appears calm and comfortable, elderly frail-appearing Respiratory system: Clear to auscultation. Respiratory effort normal. Cardiovascular system: S1 & S2 heard, RRR. No JVD, murmurs, rubs, gallops or clicks. No pedal edema. Gastrointestinal system: Abdomen is nondistended, soft and nontender. No organomegaly or masses felt. Normal bowel sounds heard. Central nervous system: Alert and oriented only to her name. No focal neurological deficits. Extremities: Symmetric 4 x 5 power. Skin: No rashes, lesions or ulcers Psychiatry: Overall poor insight Multiple unstageable sacral, heel ulcer noted   Data Reviewed:   CBC: Recent Labs  Lab 09/13/18 2009 09/14/18 0548  WBC 27.5* 23.7*  HGB 8.6* 7.6*  HCT 26.2* 24.4*  MCV 79.9*  83.6  PLT 399 833   Basic Metabolic Panel: Recent Labs  Lab 09/13/18 2009 09/14/18 0548  NA 141 141  K 3.1* 3.6  CL 107 108  CO2 21* 20*  GLUCOSE 146* 106*  BUN 19 18  CREATININE 1.40* 1.18*  CALCIUM 7.7* 7.7*  MG 1.4* 1.3*  PHOS 2.8 2.1*   GFR: Estimated Creatinine Clearance: 29.9 mL/min (A) (by C-G formula based on SCr of 1.18 mg/dL (H)). Liver Function Tests: Recent Labs  Lab 09/13/18 2009 09/14/18 0548  AST 22 17  ALT 15 11  ALKPHOS 70 52  BILITOT 1.0 0.9  PROT 5.8* 5.0*  ALBUMIN 2.0* 2.1*   No results for input(s): LIPASE, AMYLASE in the last 168 hours. No results for input(s): AMMONIA in the last 168 hours. Coagulation Profile: Recent Labs  Lab 09/13/18 2009  INR 1.28   Cardiac Enzymes: No results for input(s): CKTOTAL, CKMB, CKMBINDEX, TROPONINI in the last 168 hours. BNP (last 3 results) No results for input(s): PROBNP in the last 8760 hours. HbA1C: No results for input(s): HGBA1C in the last 72 hours. CBG: Recent Labs  Lab 09/13/18 2043 09/14/18 0025 09/14/18 0330 09/14/18 0726 09/14/18 0759  GLUCAP 107* 96 102* 27* 33*   Lipid Profile: No results for input(s): CHOL, HDL, LDLCALC, TRIG, CHOLHDL, LDLDIRECT in the last 72 hours. Thyroid Function Tests: Recent Labs    09/14/18 0548  TSH 3.661   Anemia Panel: No results for input(s): VITAMINB12, FOLATE, FERRITIN, TIBC, IRON, RETICCTPCT in the last 72 hours. Sepsis Labs: Recent Labs  Lab 09/13/18 2009  LATICACIDVEN 2.5*    Recent Results (from the past 240 hour(s))  Culture, blood (routine x 2) Call MD if unable to obtain prior to antibiotics being given     Status: None (Preliminary result)   Collection Time: 09/13/18 10:00 PM  Result Value Ref Range Status   Specimen Description   Final    BLOOD RIGHT ARM Performed at [facility] [address] [place], [place] [address]    Special Requests   Final    BOTTLES DRAWN AEROBIC ONLY Blood Culture results may not  be optimal due to an inadequate volume of blood received in culture bottles Performed at [facility] [address], [place], [place] [address]    Culture   Final    NO GROWTH < 12 HOURS Performed at [facility] [address] [place], [place] [address]    Report Status PENDING  Incomplete  MRSA PCR Screening     Status: None   Collection Time: 09/13/18 10:16 PM  Result Value Ref Range Status   MRSA by PCR NEGATIVE NEGATIVE Final    Comment:        The GeneXpert MRSA Assay (FDA approved for NASAL specimens only), is one component of a comprehensive MRSA colonization surveillance program. It is not intended to diagnose MRSA infection nor to guide or monitor treatment for MRSA infections. Performed at [facility], [place] [address], [place], [place] [address]   Culture, blood (routine x 2) Call MD if unable to obtain prior to antibiotics being given     Status: None (Preliminary result)   Collection Time: 09/13/18 11:44 PM  Result Value Ref Range Status   Specimen Description   Final    BLOOD LEFT WRIST Performed at [facility] [address], [place], [place] [address]    Special Requests   Final    BOTTLES DRAWN AEROBIC ONLY Blood Culture adequate volume Performed at [facility] [address], [place], [place] [address]    Culture   Final    NO GROWTH < 12 HOURS Performed at [facility] [address], [place], [place] [address]    Report Status PENDING  Incomplete         Radiology Studies: No results found.      Scheduled Meds: . donepezil  10 mg Oral QHS  . escitalopram  10 mg Oral Daily  . folic acid  1 mg Oral Daily  . insulin aspart  0-9 Units Subcutaneous Q4H  . mirtazapine  7.5 mg Oral QHS  . pantoprazole (PROTONIX) IV  40 mg Intravenous Q12H   Continuous Infusions: . piperacillin-tazobactam (ZOSYN)  IV 3.375 g (09/14/18 0536)  . vancomycin Stopped  (09/14/18 0004)     LOS: 1 day   Time spent= 40 mins    Stanislaus Kaltenbach Arsenio Loader, MD [facility] Pager [phone]   If 7PM-7AM, please contact night-coverage [email or web address] Password  [facility] 09/14/2018, 8:29 AM

## 2018-09-15 LAB — COMPREHENSIVE METABOLIC PANEL
ALK PHOS: 67 U/L (ref 38–126)
ALT: 13 U/L (ref 0–44)
ANION GAP: 13 (ref 5–15)
AST: 25 U/L (ref 15–41)
Albumin: 1.9 g/dL — ABNORMAL LOW (ref 3.5–5.0)
BUN: 13 mg/dL (ref 8–23)
CO2: 18 mmol/L — ABNORMAL LOW (ref 22–32)
Calcium: 7.8 mg/dL — ABNORMAL LOW (ref 8.9–10.3)
Chloride: 111 mmol/L (ref 98–111)
Creatinine, Ser: 1.17 mg/dL — ABNORMAL HIGH (ref 0.44–1.00)
GFR calc Af Amer: 50 mL/min — ABNORMAL LOW (ref >60)
GFR calc non Af Amer: 43 mL/min — ABNORMAL LOW (ref >60)
Glucose, Bld: 202 mg/dL — ABNORMAL HIGH (ref 70–99)
Potassium: 2.9 mmol/L — ABNORMAL LOW (ref 3.5–5.1)
Sodium: 142 mmol/L (ref 135–145)
Total Bilirubin: 1 mg/dL (ref 0.3–1.2)
Total Protein: 4.9 g/dL — ABNORMAL LOW (ref 6.5–8.1)

## 2018-09-15 LAB — GLUCOSE, CAPILLARY
GLUCOSE-CAPILLARY: 152 mg/dL — AB (ref 70–99)
Glucose-Capillary: 150 mg/dL — ABNORMAL HIGH (ref 70–99)
Glucose-Capillary: 156 mg/dL — ABNORMAL HIGH (ref 70–99)
Glucose-Capillary: 164 mg/dL — ABNORMAL HIGH (ref 70–99)
Glucose-Capillary: 40 mg/dL — CL (ref 70–99)
Glucose-Capillary: 63 mg/dL — ABNORMAL LOW (ref 70–99)
Glucose-Capillary: 71 mg/dL (ref 70–99)
Glucose-Capillary: 80 mg/dL (ref 70–99)
Glucose-Capillary: 92 mg/dL (ref 70–99)

## 2018-09-15 LAB — URINE CULTURE

## 2018-09-15 LAB — CBC
HCT: 31.4 % — ABNORMAL LOW (ref 36.0–46.0)
Hemoglobin: 10.5 g/dL — ABNORMAL LOW (ref 12.0–15.0)
MCH: 27.3 pg (ref 26.0–34.0)
MCHC: 33.4 g/dL (ref 30.0–36.0)
MCV: 81.8 fL (ref 80.0–100.0)
Platelets: 321 10*3/uL (ref 150–400)
RBC: 3.84 MIL/uL — ABNORMAL LOW (ref 3.87–5.11)
RDW: 19.5 % — ABNORMAL HIGH (ref 11.5–15.5)
WBC: 22 10*3/uL — ABNORMAL HIGH (ref 4.0–10.5)
nRBC: 0.4 % — ABNORMAL HIGH (ref 0.0–0.2)

## 2018-09-15 LAB — MAGNESIUM: Magnesium: 1.3 mg/dL — ABNORMAL LOW (ref 1.7–2.4)

## 2018-09-15 MED ORDER — DEXTROSE 50 % IV SOLN
INTRAVENOUS | Status: AC
  Administered 2018-09-15: 50 mL
  Filled 2018-09-15: qty 50

## 2018-09-15 MED ORDER — POTASSIUM CHLORIDE CRYS ER 20 MEQ PO TBCR
40.0000 meq | EXTENDED_RELEASE_TABLET | Freq: Two times a day (BID) | ORAL | Status: AC
  Administered 2018-09-15 (×2): 40 meq via ORAL
  Filled 2018-09-15 (×2): qty 2

## 2018-09-15 MED ORDER — POTASSIUM CHLORIDE 10 MEQ/100ML IV SOLN
10.0000 meq | INTRAVENOUS | Status: AC
  Administered 2018-09-15 (×4): 10 meq via INTRAVENOUS
  Filled 2018-09-15 (×4): qty 100

## 2018-09-15 MED ORDER — DEXTROSE 50 % IV SOLN
INTRAVENOUS | Status: AC
  Administered 2018-09-15: 50 mL via INTRAVENOUS
  Filled 2018-09-15: qty 50

## 2018-09-15 MED ORDER — DEXTROSE-NACL 5-0.45 % IV SOLN
INTRAVENOUS | Status: DC
  Administered 2018-09-15 – 2018-09-16 (×2): via INTRAVENOUS

## 2018-09-15 MED ORDER — TRAMADOL HCL 50 MG PO TABS: 50.0000 mg | ORAL_TABLET | Freq: Once | ORAL | Status: DC

## 2018-09-15 MED ORDER — MAGNESIUM SULFATE 2 GM/50ML IV SOLN
2.0000 g | Freq: Once | INTRAVENOUS | Status: AC
  Administered 2018-09-15: 2 g via INTRAVENOUS
  Filled 2018-09-15: qty 50

## 2018-09-15 NOTE — Progress Notes (Signed)
PT Cancellation Note  Patient Details Name: Cathy Taylor MRN: [id number] DOB: December 14, 1932   Cancelled Treatment:    Reason Eval/Treat Not Completed: Medical issues which prohibited therapy - Pt receiving blood today. Pt's family in room, pt appearing very fatigued and non-interactive with PT. Family defers PT, will check back as schedule allows.  Julien Girt, PT [facility] Services Pager [phone]  Office [phone]    Roxine Caddy D Elonda Husky 09/15/2018, 2:58 PM

## 2018-09-15 NOTE — Progress Notes (Signed)
PROGRESS NOTE    Cathy Taylor  DJM:[id number] DOB: [birth year] DOA: 09/13/2018 PCP: Maryella Shivers, MD   Brief Narrative:  84 year old with history of CKD, anemia, failure to thrive, diabetes mellitus type 2, grade 1 diastolic dysfunction, depression, pacemaker in place admitted for low hemoglobin of 7.1.  Transferred from [place] for further care.  Had EGD done about a week ago showed severe gastritis, gastric ulcer, esophageal ulcer and duodenitis.  CT showed concerns for ischemic enteritis and SMA occlusion.  Started on broad-spectrum antibiotics.  Vascular surgery-not a surgical candidate.   Assessment & Plan:   Active Problems:   HTN (hypertension)   Diabetes mellitus ([place])   Cardiomyopathy ([place])   Hypokalemia   Debility   History of stroke   HLD (hyperlipidemia)   Vascular dementia with behavior disturbance (HCC)   Failure to thrive in adult   Chronic anemia   CKD (chronic kidney disease) stage 3, GFR 30-59 ml/min (HCC)   Dementia (HCC)   Protein-calorie malnutrition, severe   Pressure injury of skin   Chronic intestinal ischemia (HCC)   Enteritis, ischemic (HCC)   Ischemic enteritis (HCC)   HCAP (healthcare-associated pneumonia)   Hypocalcemia   AKI (acute kidney injury) ([place])  GI bleed, occult- also related?  Versus ischemic gut? Acute ischemic enteritis -Not a surgical candidate.  Vancomycin discontinued, continue Zosyn -Continue PPI.  Diet as tolerated-will advance slowly -Cultures remain negative at this time -Supportive care, transfusion as needed -Not a candidate for aspirin due to ongoing GI bleed  UTI with hematuria -Continue Zosyn at this time.  Acute kidney injury on CKD stage III -Avoid nephrotoxic drugs  Anemia, likely chronic disease Generalized weakness -Status post units PRBC transfusion.  Hemoglobin increased from 7.6 to 10.5.  Appears to be stable.  We will keep a close eye on this. -PT/OT due to  weakness  Hypomagnesemia/hypokalemia -Aggressively replete this.  Check phosphorus  Hypoglycemia -Currently on D10 half-normal saline.  Blood glucose holding off.  Will encourage oral diet so we can wean off IV fluids.  Advanced dementia Failure to thrive with severe protein calorie malnutrition -Provide supportive care.  Encourage oral intake.  Low albumin is causing third spacing. -PT/OT -Prealbumin level less than 5 -Nutrition team following  Hyperlipidemia -Continue home meds statin  Essential hypertension - Medications on hold for now.  Diabetes mellitus type 2 with hypoglycemia secondary to poor oral intake -Discontinued sliding scale.  It is okay to let her blood glucose running little high. History of CVA -Aspirin on hold  Multiple pressure ulcers, unstageable - Routine care per nursing staff.  Goals of care discussion-I spoke with the team, she understands that overall patient has poor prognosis given her comorbidities and advanced age.  This is further complicated by poor oral intake.  Her prealbumin levels are significantly low. They will continue to follow with outpatient palliative care team and consider transitioning her to more comfort care once all the siblings are met together.  DVT prophylaxis: SCDs Code Status: DNR/DNI Family Communication: Spoke with the patient's daughter Jeannene Patella over the phone Disposition Plan: Maintain inpatient stay until electrolytes are well corrected.  She is well-hydrated and her blood glucose remained steady off D10 fluids  Consultants:   None  Procedures:   None  Antimicrobials:   Vancomycin discontinued 12/24  Zosyn   Subjective: Patient had issues with hypoglycemia requiring multiple ampules of 825.  Also had to be placed on D10 fluid.  Blood glucose is okay for now.  Overnight also issues with  hypothermia which is improved. Patient is laying in the bed and does not have any complaints.  Review of Systems Otherwise  negative except as per HPI, including: General = no fevers, chills, dizziness, malaise, fatigue HEENT/EYES = negative for pain, redness, loss of vision, double vision, blurred vision, loss of hearing, sore throat, hoarseness, dysphagia Cardiovascular= negative for chest pain, palpitation, murmurs, lower extremity swelling Respiratory/lungs= negative for shortness of breath, cough, hemoptysis, wheezing, mucus production Gastrointestinal= negative for nausea, vomiting,, abdominal pain, melena, hematemesis Genitourinary= negative for Dysuria, Hematuria, Change in Urinary Frequency MSK = Negative for arthralgia, myalgias, Back Pain, Joint swelling  Neurology= Negative for headache, seizures, numbness, tingling  Psychiatry= Negative for anxiety, depression, suicidal and homocidal ideation Allergy/Immunology= Medication/Food allergy as listed  Skin= Negative for Rash, lesions, ulcers, itching  Objective: Vitals:   09/14/18 2253 09/15/18 0438 09/15/18 1015 09/15/18 1045  BP: (!) 152/87  (!) 143/79 124/69  Pulse: 73  (!) 101 78  Resp: '14  20 20  ' Temp: (!) 97 F (36.1 C) 97.6 F (36.4 C) 97.8 F (36.6 C) 97.6 F (36.4 C)  TempSrc: Rectal Rectal Oral Oral  SpO2: 100% 100% 100% 100%  Weight:      Height:        Intake/Output Summary (Last 24 hours) at 09/15/2018 1055 Last data filed at 09/15/2018 0409 Gross per 24 hour  Intake 474.84 ml  Output 450 ml  Net 24.84 ml   Filed Weights   09/13/18 2001 09/13/18 2038  Weight: 57.6 kg 57.6 kg    Examination:  Constitutional: Appears chronically ill Eyes: PERRL, lids and conjunctivae normal ENMT: Mucous membranes are dry. Posterior pharynx clear of any exudate or lesions.Normal dentition.  Neck: normal, supple, no masses, no thyromegaly Respiratory: clear to auscultation bilaterally, no wheezing, no crackles. Normal respiratory effort. No accessory muscle use.  Cardiovascular: Regular rate and rhythm, no murmurs / rubs / gallops. No  extremity edema. 2+ pedal pulses. No carotid bruits.  Abdomen: no tenderness, no masses palpated. No hepatosplenomegaly. Bowel sounds positive.  Musculoskeletal: no clubbing / cyanosis. No joint deformity upper and lower extremities. Good ROM, no contractures. Normal muscle tone.  Skin: Multiple areas of unstageable heel and back ulcers. Neurologic: CN 2-12 grossly intact. Sensation intact, DTR normal. Strength 4/5 in all 4.  Psychiatric: Unable to fully assess.  Alert and oriented to her name   Data Reviewed:   CBC: Recent Labs  Lab 09/13/18 2009 09/14/18 0548 09/15/18 0512  WBC 27.5* 23.7* 22.0*  HGB 8.6* 7.6* 10.5*  HCT 26.2* 24.4* 31.4*  MCV 79.9* 83.6 81.8  PLT 399 306 409   Basic Metabolic Panel: Recent Labs  Lab 09/13/18 2009 09/14/18 0548 09/15/18 0512  NA 141 141 142  K 3.1* 3.6 2.9*  CL 107 108 111  CO2 21* 20* 18*  GLUCOSE 146* 106* 202*  BUN '19 18 13  ' CREATININE 1.40* 1.18* 1.17*  CALCIUM 7.7* 7.7* 7.8*  MG 1.4* 1.3* 1.3*  PHOS 2.8 2.1*  --    GFR: Estimated Creatinine Clearance: 30.1 mL/min (A) (by C-G formula based on SCr of 1.17 mg/dL (H)). Liver Function Tests: Recent Labs  Lab 09/13/18 2009 09/14/18 0548 09/15/18 0512  AST '22 17 25  ' ALT '15 11 13  ' ALKPHOS 70 52 67  BILITOT 1.0 0.9 1.0  PROT 5.8* 5.0* 4.9*  ALBUMIN 2.0* 2.1* 1.9*   No results for input(s): LIPASE, AMYLASE in the last 168 hours. No results for input(s): AMMONIA in the last 168 hours.  Coagulation Profile: Recent Labs  Lab 09/13/18 2009  INR 1.28   Cardiac Enzymes: No results for input(s): CKTOTAL, CKMB, CKMBINDEX, TROPONINI in the last 168 hours. BNP (last 3 results) No results for input(s): PROBNP in the last 8760 hours. HbA1C: No results for input(s): HGBA1C in the last 72 hours. CBG: Recent Labs  Lab 09/15/18 0022 09/15/18 0226 09/15/18 0419 09/15/18 0811 09/15/18 0908  GLUCAP 150* 152* 164* 63* 156*   Lipid Profile: No results for input(s): CHOL, HDL,  LDLCALC, TRIG, CHOLHDL, LDLDIRECT in the last 72 hours. Thyroid Function Tests: Recent Labs    09/14/18 0548  TSH 3.661   Anemia Panel: No results for input(s): VITAMINB12, FOLATE, FERRITIN, TIBC, IRON, RETICCTPCT in the last 72 hours. Sepsis Labs: Recent Labs  Lab 09/13/18 2009 09/14/18 0548  PROCALCITON  --  0.34  LATICACIDVEN 2.5*  --     Recent Results (from the past 240 hour(s))  Culture, blood (routine x 2) Call MD if unable to obtain prior to antibiotics being given     Status: None (Preliminary result)   Collection Time: 09/13/18 10:00 PM  Result Value Ref Range Status   Specimen Description BLOOD RIGHT ARM  Final   Special Requests   Final    BOTTLES DRAWN AEROBIC ONLY Blood Culture results may not be optimal due to an inadequate volume of blood received in culture bottles Performed at [facility] [address] [place], [place] [address]    Culture NO GROWTH 1 DAY  Final   Report Status PENDING  Incomplete  MRSA PCR Screening     Status: None   Collection Time: 09/13/18 10:16 PM  Result Value Ref Range Status   MRSA by PCR NEGATIVE NEGATIVE Final    Comment:        The GeneXpert MRSA Assay (FDA approved for NASAL specimens only), is one component of a comprehensive MRSA colonization surveillance program. It is not intended to diagnose MRSA infection nor to guide or monitor treatment for MRSA infections. Performed at [facility], [place] [address] [place], [place] [address]   Culture, blood (routine x 2) Call MD if unable to obtain prior to antibiotics being given     Status: None (Preliminary result)   Collection Time: 09/13/18 11:44 PM  Result Value Ref Range Status   Specimen Description BLOOD LEFT WRIST  Final   Special Requests   Final    BOTTLES DRAWN AEROBIC ONLY Blood Culture adequate volume Performed at [facility] [address] [place], [place] [address]    Culture NO GROWTH 1 DAY   Final   Report Status PENDING  Incomplete  Urine Culture     Status: None   Collection Time: 09/14/18  5:35 AM  Result Value Ref Range Status   Specimen Description   Final    URINE, CATHETERIZED Performed at [facility] [address] [place], [place] [address]    Special Requests   Final    NONE Performed at [facility], [place] [address], [place], [place] [address]    Culture   Final    Multiple bacterial morphotypes present, none predominant. Suggest appropriate recollection if clinically indicated.   Report Status 09/15/2018 FINAL  Final         Radiology Studies: Dg Chest Port 1 View  Result Date: 09/14/2018 CLINICAL DATA:  Dyspnea. EXAM: PORTABLE CHEST 1 VIEW COMPARISON:  09/13/2018. FINDINGS: Cardiomegaly. Loop recorder. Calcified tortuous aorta. LEFT shoulder  replacement. Low lung volumes with probable subsegmental atelectasis. Minimal blunting BILATERAL CP angle is stable. Degenerative change RIGHT shoulder. IMPRESSION: Low lung volumes, otherwise stable chest. Electronically Signed   By: Staci Righter M.D.   On: 09/14/2018 09:15   [place] Ekg Site Rite  Result Date: 09/14/2018 If Site Rite image not attached, placement could not be confirmed due to current cardiac rhythm.       Scheduled Meds: . donepezil  10 mg Oral QHS  . escitalopram  10 mg Oral Daily  . folic acid  1 mg Oral Daily  . Gerhardt's butt cream   Topical TID  . mirtazapine  7.5 mg Oral QHS  . pantoprazole (PROTONIX) IV  40 mg Intravenous Q12H  . potassium chloride  40 mEq Oral BID  . sodium chloride flush  10-40 mL Intracatheter Q12H   Continuous Infusions: . dextrose 75 mL/hr at 09/15/18 0630  . magnesium sulfate 1 - 4 g bolus IVPB    . piperacillin-tazobactam 3.375 g (09/15/18 0529)  . potassium chloride       LOS: 2 days   Time spent= 40 mins    Krislyn Donnan Arsenio Loader, MD [facility] Pager [phone]   If 7PM-7AM, please contact  night-coverage [email or web address] Password [facility] 09/15/2018, 10:55 AM

## 2018-09-15 NOTE — Progress Notes (Signed)
OT Cancellation Note  Patient Details Name: Cathy Taylor MRN: [id number] DOB: 06-Jun-1933   Cancelled Treatment:    Reason Eval/Treat Not Completed: Other (comment) Pt getting blood when OT checked on pt- will check on pt next day.  Kari Baars, OT Acute Rehabilitation Services [phone] Office- [phone], Edwena Felty D 09/15/2018, 1:07 PM

## 2018-09-16 ENCOUNTER — Telehealth: Payer: Self-pay

## 2018-09-16 ENCOUNTER — Inpatient Hospital Stay (HOSPITAL_COMMUNITY): Payer: Medicare Other

## 2018-09-16 LAB — TYPE AND SCREEN
ABO/RH(D): O POS
Antibody Screen: NEGATIVE
UNIT DIVISION: 0
Unit division: 0

## 2018-09-16 LAB — BPAM RBC
Blood Product Expiration Date: 202001222359
Blood Product Expiration Date: 202001222359
ISSUE DATE / TIME: 201912241657
ISSUE DATE / TIME: 201912251043
Unit Type and Rh: 5100
Unit Type and Rh: 5100

## 2018-09-16 LAB — GLUCOSE, CAPILLARY
GLUCOSE-CAPILLARY: 71 mg/dL (ref 70–99)
GLUCOSE-CAPILLARY: 33 mg/dL — AB (ref 70–99)
GLUCOSE-CAPILLARY: 116 mg/dL — AB (ref 70–99)
Glucose-Capillary: 139 mg/dL — ABNORMAL HIGH (ref 70–99)
Glucose-Capillary: 153 mg/dL — ABNORMAL HIGH (ref 70–99)
Glucose-Capillary: 179 mg/dL — ABNORMAL HIGH (ref 70–99)
Glucose-Capillary: 27 mg/dL — CL (ref 70–99)
Glucose-Capillary: 33 mg/dL — CL (ref 70–99)
Glucose-Capillary: 63 mg/dL — ABNORMAL LOW (ref 70–99)
Glucose-Capillary: 63 mg/dL — ABNORMAL LOW (ref 70–99)
Glucose-Capillary: 68 mg/dL — ABNORMAL LOW (ref 70–99)
Glucose-Capillary: 74 mg/dL (ref 70–99)

## 2018-09-16 LAB — MAGNESIUM: Magnesium: 2 mg/dL (ref 1.7–2.4)

## 2018-09-16 LAB — COMPREHENSIVE METABOLIC PANEL
ALBUMIN: 2 g/dL — AB (ref 3.5–5.0)
ALT: 13 U/L (ref 0–44)
ANION GAP: 12 (ref 5–15)
AST: 17 U/L (ref 15–41)
Alkaline Phosphatase: 82 U/L (ref 38–126)
BUN: 12 mg/dL (ref 8–23)
CO2: 18 mmol/L — ABNORMAL LOW (ref 22–32)
Calcium: 8 mg/dL — ABNORMAL LOW (ref 8.9–10.3)
Chloride: 114 mmol/L — ABNORMAL HIGH (ref 98–111)
Creatinine, Ser: 1.13 mg/dL — ABNORMAL HIGH (ref 0.44–1.00)
GFR calc Af Amer: 52 mL/min — ABNORMAL LOW (ref >60)
GFR calc non Af Amer: 45 mL/min — ABNORMAL LOW (ref >60)
Glucose, Bld: 136 mg/dL — ABNORMAL HIGH (ref 70–99)
POTASSIUM: 3.6 mmol/L (ref 3.5–5.1)
Sodium: 144 mmol/L (ref 135–145)
Total Bilirubin: 1.2 mg/dL (ref 0.3–1.2)
Total Protein: 5 g/dL — ABNORMAL LOW (ref 6.5–8.1)

## 2018-09-16 LAB — CBC
HCT: 39.5 % (ref 36.0–46.0)
Hemoglobin: 13.5 g/dL (ref 12.0–15.0)
MCH: 28.1 pg (ref 26.0–34.0)
MCHC: 34.2 g/dL (ref 30.0–36.0)
MCV: 82.3 fL (ref 80.0–100.0)
PLATELETS: 245 10*3/uL (ref 150–400)
RBC: 4.8 MIL/uL (ref 3.87–5.11)
RDW: 19.4 % — ABNORMAL HIGH (ref 11.5–15.5)
WBC: 22.3 10*3/uL — ABNORMAL HIGH (ref 4.0–10.5)
nRBC: 0.4 % — ABNORMAL HIGH (ref 0.0–0.2)

## 2018-09-16 LAB — PHOSPHORUS: Phosphorus: 1.3 mg/dL — ABNORMAL LOW (ref 2.5–4.6)

## 2018-09-16 LAB — HEMOGLOBIN A1C
Hgb A1c MFr Bld: <4.2 % — ABNORMAL LOW (ref 4.8–5.6)
Mean Plasma Glucose: <74 mg/dL

## 2018-09-16 MED ORDER — LORAZEPAM 2 MG/ML IJ SOLN
1.0000 mg | INTRAMUSCULAR | Status: DC | PRN
  Administered 2018-09-17: 1 mg via INTRAVENOUS
  Filled 2018-09-16: qty 1

## 2018-09-16 MED ORDER — ALBUTEROL SULFATE (2.5 MG/3ML) 0.083% IN NEBU: 2.5000 mg | INHALATION_SOLUTION | RESPIRATORY_TRACT | Status: DC | PRN

## 2018-09-16 MED ORDER — SODIUM PHOSPHATES 45 MMOLE/15ML IV SOLN
20.0000 mmol | Freq: Once | INTRAVENOUS | Status: AC
  Administered 2018-09-16: 20 mmol via INTRAVENOUS
  Filled 2018-09-16: qty 6.67

## 2018-09-16 MED ORDER — HALOPERIDOL LACTATE 5 MG/ML IJ SOLN: 0.5000 mg | INTRAMUSCULAR | Status: DC | PRN

## 2018-09-16 MED ORDER — GLUCAGON HCL RDNA (DIAGNOSTIC) 1 MG IJ SOLR: 1.0000 mg | Freq: Once | INTRAMUSCULAR | Status: DC | PRN

## 2018-09-16 MED ORDER — DEXTROSE 50 % IV SOLN
50.0000 mL | Freq: Once | INTRAVENOUS | Status: AC
  Administered 2018-09-15: 50 mL via INTRAVENOUS

## 2018-09-16 MED ORDER — SODIUM CHLORIDE (PF) 0.9 % IJ SOLN
INTRAMUSCULAR | Status: AC
  Filled 2018-09-16: qty 50

## 2018-09-16 MED ORDER — JUVEN PO PACK
1.0000 | PACK | Freq: Two times a day (BID) | ORAL | Status: DC
  Filled 2018-09-16 (×6): qty 1

## 2018-09-16 MED ORDER — IOHEXOL 300 MG/ML  SOLN
30.0000 mL | Freq: Once | INTRAMUSCULAR | Status: AC | PRN
  Administered 2018-09-16: 30 mL via ORAL

## 2018-09-16 MED ORDER — SENNA 8.6 MG PO TABS: 1.0000 | ORAL_TABLET | Freq: Every evening | ORAL | Status: DC | PRN

## 2018-09-16 MED ORDER — POTASSIUM CHLORIDE CRYS ER 20 MEQ PO TBCR: 40.0000 meq | EXTENDED_RELEASE_TABLET | Freq: Once | ORAL | Status: DC

## 2018-09-16 MED ORDER — HALOPERIDOL 0.5 MG PO TABS
0.5000 mg | ORAL_TABLET | ORAL | Status: DC | PRN
  Filled 2018-09-16: qty 1

## 2018-09-16 MED ORDER — MORPHINE SULFATE (PF) 2 MG/ML IV SOLN
1.0000 mg | INTRAVENOUS | Status: DC | PRN
  Administered 2018-09-16 – 2018-09-18 (×6): 1 mg via INTRAVENOUS
  Filled 2018-09-16 (×7): qty 1

## 2018-09-16 MED ORDER — LORAZEPAM 1 MG PO TABS: 1.0000 mg | ORAL_TABLET | ORAL | Status: DC | PRN

## 2018-09-16 MED ORDER — LORAZEPAM 2 MG/ML PO CONC
1.0000 mg | ORAL | Status: DC | PRN
  Filled 2018-09-16: qty 0.5

## 2018-09-16 MED ORDER — DEXTROSE 50 % IV SOLN
25.0000 g | Freq: Once | INTRAVENOUS | Status: AC
  Administered 2018-09-16: 25 g via INTRAVENOUS

## 2018-09-16 MED ORDER — IOHEXOL 300 MG/ML  SOLN
75.0000 mL | Freq: Once | INTRAMUSCULAR | Status: AC | PRN
  Administered 2018-09-16: 75 mL via INTRAVENOUS

## 2018-09-16 MED ORDER — POTASSIUM CHLORIDE 10 MEQ/100ML IV SOLN
10.0000 meq | INTRAVENOUS | Status: AC
  Administered 2018-09-16 (×4): 10 meq via INTRAVENOUS
  Filled 2018-09-16 (×4): qty 100

## 2018-09-16 MED ORDER — ONDANSETRON HCL 4 MG/2ML IJ SOLN: 4.0000 mg | Freq: Four times a day (QID) | INTRAMUSCULAR | Status: DC | PRN

## 2018-09-16 MED ORDER — NYSTATIN 100000 UNIT/GM EX POWD
Freq: Three times a day (TID) | CUTANEOUS | Status: DC | PRN
  Filled 2018-09-16: qty 15

## 2018-09-16 MED ORDER — HALOPERIDOL LACTATE 2 MG/ML PO CONC
0.5000 mg | ORAL | Status: DC | PRN
  Filled 2018-09-16: qty 0.3

## 2018-09-16 MED ORDER — DEXTROSE 5 % IV SOLN
INTRAVENOUS | Status: AC
  Administered 2018-09-16: 1000 mL via INTRAVENOUS
  Administered 2018-09-17: 07:00:00 via INTRAVENOUS

## 2018-09-16 MED ORDER — MAGNESIUM OXIDE 400 (241.3 MG) MG PO TABS: 800.0000 mg | ORAL_TABLET | ORAL | Status: AC

## 2018-09-16 MED ORDER — GLUCAGON HCL RDNA (DIAGNOSTIC) 1 MG IJ SOLR
INTRAMUSCULAR | Status: AC
  Administered 2018-09-16: 1 mg
  Filled 2018-09-16: qty 1

## 2018-09-16 MED ORDER — POLYVINYL ALCOHOL 1.4 % OP SOLN
1.0000 [drp] | Freq: Four times a day (QID) | OPHTHALMIC | Status: DC | PRN
  Filled 2018-09-16: qty 15

## 2018-09-16 MED ORDER — ATROPINE SULFATE 1 % OP SOLN
4.0000 [drp] | OPHTHALMIC | Status: DC | PRN
  Filled 2018-09-16 (×2): qty 2

## 2018-09-16 MED ORDER — ONDANSETRON 4 MG PO TBDP: 4.0000 mg | ORAL_TABLET | Freq: Four times a day (QID) | ORAL | Status: DC | PRN

## 2018-09-16 MED ORDER — MAGIC MOUTHWASH
15.0000 mL | Freq: Four times a day (QID) | ORAL | Status: DC | PRN
  Filled 2018-09-16: qty 15

## 2018-09-16 MED ORDER — OXYBUTYNIN CHLORIDE 5 MG PO TABS: 2.5000 mg | ORAL_TABLET | Freq: Four times a day (QID) | ORAL | Status: DC | PRN

## 2018-09-16 NOTE — Progress Notes (Signed)
OT Cancellation Note  Patient Details Name: Cathy Taylor MRN: [id number] DOB: Mar 21, 1933   Cancelled Treatment:    Reason Eval/Treat Not Completed: Other (comment).  Noted, pt is from SNF; will defer OT evaluation to that venue, if pt's functional status has changed  [place] 09/16/2018, 12:38 PM  Lesle Chris, OTR/L Acute Rehabilitation Services [phone] [facility] pager [phone] office 09/16/2018

## 2018-09-16 NOTE — Progress Notes (Signed)
PROGRESS NOTE    Cathy Taylor  QVZ:[id number] DOB: 07-02-1933 DOA: 09/13/2018 PCP: Maryella Shivers, MD   Brief Narrative:  84 year old with history of CKD, anemia, failure to thrive, diabetes mellitus type 2, grade 1 diastolic dysfunction, depression, pacemaker in place admitted for low hemoglobin of 7.1.  Transferred from [place] for further care.  Had EGD done about a week ago showed severe gastritis, gastric ulcer, esophageal ulcer and duodenitis.  CT showed concerns for ischemic enteritis and SMA occlusion.  Started on broad-spectrum antibiotics.  Vascular surgery-not a surgical candidate.   Assessment & Plan:   Active Problems:   HTN (hypertension)   Diabetes mellitus ([place])   Cardiomyopathy ([place])   Hypokalemia   Debility   History of stroke   HLD (hyperlipidemia)   Vascular dementia with behavior disturbance (HCC)   Failure to thrive in adult   Chronic anemia   CKD (chronic kidney disease) stage 3, GFR 30-59 ml/min (HCC)   Dementia (HCC)   Protein-calorie malnutrition, severe   Pressure injury of skin   Chronic intestinal ischemia (HCC)   Enteritis, ischemic (HCC)   Ischemic enteritis (HCC)   HCAP (healthcare-associated pneumonia)   Hypocalcemia   AKI (acute kidney injury) ([place])  GI bleed, occult- also related?  Versus ischemic gut? Acute ischemic enteritis -Not a surgical candidate. Off vanc, Cont ZOsyn -Continue PPI.  Diet as tolerated. Due to persistent Leukocytosis- will repeat CT A/P with IV contrast.  -Cultures remain negative at this time -Supportive care, transfusion as needed -Not a candidate for aspirin due to ongoing GI bleed  UTI with hematuria -Continue Zosyn at this time.  Acute kidney injury on CKD stage III -Avoid nephrotoxic drugs  Anemia, likely chronic disease Generalized weakness -Status post units PRBC transfusion.  Hemoglobin increased from 7.6 to 10.5.  Appears to be stable.  We will keep a close eye on this. -PT/OT due to  weakness  Hypomagnesemia/hypokalemia/Hypopho Hyperchloremia with mild acidosis -We will change fluid to D5 water.  -Aggressively replete this.    Hypoglycemia -D5 water..  Will encourage oral diet so we can wean off IV fluids.  Advanced dementia Failure to thrive with severe protein calorie malnutrition -Provide supportive care.  Encourage oral intake.  Low albumin is causing third spacing. -PT/OT -Prealbumin level less than 5 -Nutrition team following  Hyperlipidemia -Continue home meds statin  Essential hypertension - Medications on hold for now.  Diabetes mellitus type 2 with hypoglycemia secondary to poor oral intake -Discontinued sliding scale.  It is okay to let her blood glucose running little high.  History of CVA -Aspirin on hold  Multiple pressure ulcers, unstageable - Routine care per nursing staff.  Goals of care discussion-I spoke with the team, she understands that overall patient has poor prognosis given her comorbidities and advanced age.  This is further complicated by poor oral intake.  Her prealbumin levels are significantly low. They will continue to follow with outpatient palliative care team and consider transitioning her to more comfort care once all the siblings are met together.  DVT prophylaxis: SCDs Code Status: DNR/DNI Family Communication: None at bedside  Disposition Plan: Maintain inpatient stay until electrolytes are well corrected.  She is well-hydrated and her blood glucose remained steady off D10 fluids  Consultants:   None  Procedures:   None  Antimicrobials:   Vancomycin discontinued 12/24  Zosyn   Subjective: Patient continues to become hypoglycemic and has extremely poor oral intake.  Review of Systems Otherwise negative except as per HPI, including: General =  no fevers, chills, dizziness, malaise, fatigue HEENT/EYES = negative for pain, redness, loss of vision, double vision, blurred vision, loss of hearing, sore  throat, hoarseness, dysphagia Cardiovascular= negative for chest pain, palpitation, murmurs, lower extremity swelling Respiratory/lungs= negative for shortness of breath, cough, hemoptysis, wheezing, mucus production Gastrointestinal= negative for nausea, vomiting,, abdominal pain, melena, hematemesis Genitourinary= negative for Dysuria, Hematuria, Change in Urinary Frequency MSK = Negative for arthralgia, myalgias, Back Pain, Joint swelling  Neurology= Negative for headache, seizures, numbness, tingling  Psychiatry= Negative for anxiety, depression, suicidal and homocidal ideation Allergy/Immunology= Medication/Food allergy as listed  Skin= Negative for Rash, lesions, ulcers, itching   Objective: Vitals:   09/15/18 2114 09/15/18 2346 09/16/18 0423 09/16/18 1025  BP: (!) 168/118 (!) 158/105 (!) 152/106 (!) 128/118  Pulse: 67 91 72 70  Resp: _0 Temp: (!) 97.4 F (36.3 C)     TempSrc: Axillary     SpO2: 99%  90% 97%  Weight:      Height:        Intake/Output Summary (Last 24 hours) at 09/16/2018 1251 Last data filed at 09/16/2018 7619 Gross per 24 hour  Intake 436.05 ml  Output 350 ml  Net 86.05 ml   Filed Weights   09/13/18 2001 09/13/18 2038  Weight: 57.6 kg 57.6 kg    Examination: Constitutional: Not in acute distress, elderly frail-appearing with temporal wasting Eyes: PERRL, lids and conjunctivae normal ENMT: Mucous membranes are moist. Posterior pharynx clear of any exudate or lesions.Normal dentition.  Neck: normal, supple, no masses, no thyromegaly Respiratory: clear to auscultation bilaterally, no wheezing, no crackles. Normal respiratory effort. No accessory muscle use.  Cardiovascular: Regular rate and rhythm, no murmurs / rubs / gallops. No extremity edema. 2+ pedal pulses. No carotid bruits.  Abdomen: no tenderness, no masses palpated. No hepatosplenomegaly. Bowel sounds positive.  Musculoskeletal: no clubbing / cyanosis. No joint deformity upper and  lower extremities. Good ROM, no contractures. Normal muscle tone.  Skin: Multiple areas of unstageable ulcer including her back and heel. Neurologic: Grossly moving all the extremities, strength is 4/5 in all 4 extremities Psychiatric: Patient is awake and alert to her name.  This is pretty much her baseline.   Data Reviewed:   CBC: Recent Labs  Lab 09/13/18 2009 09/14/18 0548 09/15/18 0512 09/16/18 0403  WBC 27.5* 23.7* 22.0* 22.3*  HGB 8.6* 7.6* 10.5* 13.5  HCT 26.2* 24.4* 31.4* 39.5  MCV 79.9* 83.6 81.8 82.3  PLT 399 306 321 509   Basic Metabolic Panel: Recent Labs  Lab 09/13/18 2009 09/14/18 0548 09/15/18 0512 09/16/18 0403  NA 141 141 142 144  K 3.1* 3.6 2.9* 3.6  CL 107 108 111 114*  CO2 21* 20* 18* 18*  GLUCOSE 146* 106* 202* 136*  BUN _1 CREATININE 1.40* 1.18* 1.17* 1.13*  CALCIUM 7.7* 7.7* 7.8* 8.0*  MG 1.4* 1.3* 1.3* 2.0  PHOS 2.8 2.1*  --  1.3*   GFR: Estimated Creatinine Clearance: 31.2 mL/min (A) (by C-G formula based on SCr of 1.13 mg/dL (H)). Liver Function Tests: Recent Labs  Lab 09/13/18 2009 09/14/18 0548 09/15/18 0512 09/16/18 0403  AST _2 ALT _3 ALKPHOS 70 52 67 82  BILITOT 1.0 0.9 1.0 1.2  PROT 5.8* 5.0* 4.9* 5.0*  ALBUMIN 2.0* 2.1* 1.9* 2.0*   No results for input(s): LIPASE, AMYLASE in the last 168 hours. No results for input(s): AMMONIA in the last 168 hours.  Coagulation Profile: Recent Labs  Lab 09/13/18 2009  INR 1.28   Cardiac Enzymes: No results for input(s): CKTOTAL, CKMB, CKMBINDEX, TROPONINI in the last 168 hours. BNP (last 3 results) No results for input(s): PROBNP in the last 8760 hours. HbA1C: Recent Labs    09/14/18 0548  HGBA1C <4.2*   CBG: Recent Labs  Lab 09/16/18 0810 09/16/18 0833 09/16/18 0908 09/16/18 1023 09/16/18 1208  GLUCAP 33* 63* 74 63* 68*   Lipid Profile: No results for input(s): CHOL, HDL, LDLCALC, TRIG, CHOLHDL, LDLDIRECT in the last 72 hours. Thyroid  Function Tests: Recent Labs    09/14/18 0548  TSH 3.661   Anemia Panel: No results for input(s): VITAMINB12, FOLATE, FERRITIN, TIBC, IRON, RETICCTPCT in the last 72 hours. Sepsis Labs: Recent Labs  Lab 09/13/18 2009 09/14/18 0548  PROCALCITON  --  0.34  LATICACIDVEN 2.5*  --     Recent Results (from the past 240 hour(s))  Culture, blood (routine x 2) Call MD if unable to obtain prior to antibiotics being given     Status: None (Preliminary result)   Collection Time: 09/13/18 10:00 PM  Result Value Ref Range Status   Specimen Description   Final    BLOOD RIGHT ARM Performed at [facility] [address] [place], [place] [address]    Special Requests   Final    BOTTLES DRAWN AEROBIC ONLY Blood Culture results may not be optimal due to an inadequate volume of blood received in culture bottles Performed at [facility] [address] [place], [place] [address]    Culture   Final    NO GROWTH 2 DAYS Performed at [facility] [address] [place], [place] [address]    Report Status PENDING  Incomplete  MRSA PCR Screening     Status: None   Collection Time: 09/13/18 10:16 PM  Result Value Ref Range Status   MRSA by PCR NEGATIVE NEGATIVE Final    Comment:        The GeneXpert MRSA Assay (FDA approved for NASAL specimens only), is one component of a comprehensive MRSA colonization surveillance program. It is not intended to diagnose MRSA infection nor to guide or monitor treatment for MRSA infections. Performed at [facility], [place] [address], [place], [place] [address]   Culture, blood (routine x 2) Call MD if unable to obtain prior to antibiotics being given     Status: None (Preliminary result)   Collection Time: 09/13/18 11:44 PM  Result Value Ref Range Status   Specimen Description   Final    BLOOD LEFT WRIST Performed at [facility] [address] [place], [place]  [address]    Special Requests   Final    BOTTLES DRAWN AEROBIC ONLY Blood Culture adequate volume Performed at [facility] [address] [place], [place] [address]    Culture   Final    NO GROWTH 2 DAYS Performed at [facility] [address], [place], [place] [address]    Report Status PENDING  Incomplete  Urine Culture     Status: None   Collection Time: 09/14/18  5:35 AM  Result Value Ref Range Status   Specimen Description   Final    URINE, CATHETERIZED Performed at [facility] [address] [place],  [address]    Special Requests   Final    NONE Performed at [facility], [place]  [address] [place], [place] [address]    Culture   Final    Multiple bacterial morphotypes present, none predominant. Suggest appropriate recollection if clinically indicated.   Report Status 09/15/2018 FINAL  Final         Radiology Studies: No results found.      Scheduled Meds: . donepezil  10 mg Oral QHS  . escitalopram  10 mg Oral Daily  . folic acid  1 mg Oral Daily  . Gerhardt's butt cream   Topical TID  . magnesium oxide  800 mg Oral Q4H  . mirtazapine  7.5 mg Oral QHS  . nutrition supplement (JUVEN)  1 packet Oral BID BM  . pantoprazole (PROTONIX) IV  40 mg Intravenous Q12H  . potassium chloride  40 mEq Oral Once  . sodium chloride flush  10-40 mL Intracatheter Q12H   Continuous Infusions: . dextrose 5 % and 0.45% NaCl 50 mL/hr at 09/16/18 1116  . piperacillin-tazobactam 3.375 g (09/16/18 1121)     LOS: 3 days   Time spent= 35 mins    Ankit Arsenio Loader, MD [facility] Pager [phone]   If 7PM-7AM, please contact night-coverage [email or web address] Password [facility] 09/16/2018, 12:51 PM

## 2018-09-16 NOTE — Telephone Encounter (Signed)
Awaiting manual transmission for review of "AF" episodes that have not transmitted automatically. See phone note from 09/13/18.

## 2018-09-16 NOTE — Progress Notes (Signed)
At 1820 Pt started to spontaneously open eyes and moan having some pain. She was medicated for the pain. Her 5 children with spouses and grandchildren in the room. Husband was also brought in a wheelchair to see his wife. Dr. Reesa Chew informed of her Bp's running high,he did not want to treat that at this time.

## 2018-09-16 NOTE — Care Management Important Message (Signed)
Important Message  Patient Details  Name: Cathy Taylor MRN: [id number] Date of Birth: 24-Jun-1933   Medicare Important Message Given:  Yes    Cathy Taylor 09/16/2018, 9:16 AM

## 2018-09-16 NOTE — Progress Notes (Signed)
Patient has continued to drop her CBG's. Dr. Reesa Chew aware. Pt partially opens her eyes when spoken to. Not alert enough to swallow pills. Requested Dr. Reesa Chew change all medications to IV that can be. Family at bedside. Patient down for CT scan.

## 2018-09-16 NOTE — Progress Notes (Addendum)
Met with the patient's Daughters, Cathy Taylor and Cathy Taylor, and granddaughter who is an Therapist, sports at bedside. Discussed her care and poor prognosis in the setting of persistent hypoglycemia, poor po intake,abdnormal Ct A/p findinds and extremely poor nutritional status.  They are aware that at this time we have reached more towards the end of life. Family agrees on no aggressive measures at this time.   Also discussed the CT A/P findings from today. Family is aware that she is not a surgical candidate. At this time there is limited options available to her. She is already on Abx.   Family would like to proceed with comfort care at this time but continue blood glucose check and Abx until they have made decision to withdraw that as well.  They do agree patient is DNR/DNI, no central lines, no pressors or other artificial life saving measures.   Will add comfort care order set. Once family is ready, we will stop blood glucose check, Abx, and other measures. Patient's RN is aware of this.     Please call with questions at needed.

## 2018-09-16 NOTE — Telephone Encounter (Signed)
Pt daughter states that the pt is in the hospital and that is why they have not send a manual transmission.

## 2018-09-16 NOTE — Progress Notes (Signed)
PT Cancellation Note  Patient Details Name: Cathy Taylor MRN: [id number] DOB: Jun 30, 1933   Cancelled Treatment:    Reason Eval/Treat Not Completed: Medical issues which prohibited therapy, CBG low. Will check back another time.    Claretha Cooper 09/16/2018, 8:18 AM [place] Pager [phone] Office [phone]

## 2018-09-17 LAB — CBC
HEMATOCRIT: 38.5 % (ref 36.0–46.0)
Hemoglobin: 12.9 g/dL (ref 12.0–15.0)
MCH: 27.7 pg (ref 26.0–34.0)
MCHC: 33.5 g/dL (ref 30.0–36.0)
MCV: 82.6 fL (ref 80.0–100.0)
Platelets: 144 10*3/uL — ABNORMAL LOW (ref 150–400)
RBC: 4.66 MIL/uL (ref 3.87–5.11)
RDW: 20.1 % — ABNORMAL HIGH (ref 11.5–15.5)
WBC: 22.8 10*3/uL — ABNORMAL HIGH (ref 4.0–10.5)
nRBC: 0.7 % — ABNORMAL HIGH (ref 0.0–0.2)

## 2018-09-17 LAB — COMPREHENSIVE METABOLIC PANEL
ALBUMIN: 1.8 g/dL — AB (ref 3.5–5.0)
ALT: 12 U/L (ref 0–44)
AST: 17 U/L (ref 15–41)
Alkaline Phosphatase: 70 U/L (ref 38–126)
Anion gap: 13 (ref 5–15)
BUN: 11 mg/dL (ref 8–23)
CHLORIDE: 109 mmol/L (ref 98–111)
CO2: 18 mmol/L — AB (ref 22–32)
Calcium: 7.5 mg/dL — ABNORMAL LOW (ref 8.9–10.3)
Creatinine, Ser: 0.97 mg/dL (ref 0.44–1.00)
GFR calc Af Amer: >60 mL/min (ref >60)
GFR calc non Af Amer: 54 mL/min — ABNORMAL LOW (ref >60)
Glucose, Bld: 122 mg/dL — ABNORMAL HIGH (ref 70–99)
Potassium: 3.9 mmol/L (ref 3.5–5.1)
SODIUM: 140 mmol/L (ref 135–145)
Total Bilirubin: 0.9 mg/dL (ref 0.3–1.2)
Total Protein: 4.8 g/dL — ABNORMAL LOW (ref 6.5–8.1)

## 2018-09-17 LAB — LACTIC ACID, PLASMA: Lactic Acid, Venous: 3.7 mmol/L (ref 0.5–1.9)

## 2018-09-17 LAB — MAGNESIUM: Magnesium: 1.6 mg/dL — ABNORMAL LOW (ref 1.7–2.4)

## 2018-09-17 MED ORDER — MORPHINE SULFATE 20 MG/5ML PO SOLN: 5.0000 mg | ORAL | 0 refills | Status: AC | PRN

## 2018-09-17 MED ORDER — HYDROCODONE-ACETAMINOPHEN 5-325 MG PO TABS: 1.0000 | ORAL_TABLET | ORAL | 0 refills | Status: DC | PRN

## 2018-09-17 MED ORDER — OXYBUTYNIN CHLORIDE 5 MG PO TABS: 2.5000 mg | ORAL_TABLET | Freq: Four times a day (QID) | ORAL | 0 refills | Status: DC | PRN

## 2018-09-17 MED ORDER — LORAZEPAM 1 MG PO TABS: 1.0000 mg | ORAL_TABLET | ORAL | 0 refills | Status: AC | PRN

## 2018-09-17 MED ORDER — HALOPERIDOL 0.5 MG PO TABS: 0.5000 mg | ORAL_TABLET | ORAL | 0 refills | Status: AC | PRN

## 2018-09-17 NOTE — Progress Notes (Signed)
Physical Therapy Discharge Patient Details Name: Cathy Taylor MRN: [id number] DOB: 05-05-1933 Today's Date: 09/17/2018 Time:  -     Patient discharged from PT services secondary to medical decline - will need to re-order PT to resume therapy services.per MD note, patient to transition to Hospice care. Patient is from skilled care.   GP     Kashlyn Salinas, [place] Pager [phone] Office [phone]  09/17/2018, 11:50 AM

## 2018-09-17 NOTE — Progress Notes (Signed)
CRITICAL VALUE ALERT  Critical Value: Lactic Acid 3.7  Date & Time Notied:  12/27 4114  Provider Notified: [place]  Orders Received/Actions taken: None received yet

## 2018-09-17 NOTE — Progress Notes (Signed)
LCSW following for residential hospice placement.   Family prefers [facility].   LCSW made referral to [facility] place.   LCSW will continue to follow for dc needs.   Carolin Coy [facility] [place]

## 2018-09-17 NOTE — Progress Notes (Signed)
PROGRESS NOTE    Cathy Taylor  YOV:[id number] DOB: [birth year] DOA: 09/13/2018 PCP: Maryella Shivers, MD   Brief Narrative:  84 year old with history of CKD, anemia, failure to thrive, diabetes mellitus type 2, grade 1 diastolic dysfunction, depression, pacemaker in place admitted for low hemoglobin of 7.1.  Transferred from [place] for further care.  Had EGD done about a week ago showed severe gastritis, gastric ulcer, esophageal ulcer and duodenitis.  CT showed concerns for ischemic enteritis and SMA occlusion.  Started on broad-spectrum antibiotics.  Vascular surgery-not a surgical candidate.  Patient intermittently continued become hypoglycemic despite of getting D5 and D10 fluids.  She also received 2 units of PRBC transfusion since then her hemoglobin has remained stable around 10.  No obvious signs of bleeding.  Repeat CT showed worsening of intra-abdominal pathology.  At this time family is wishing to transition patient to hospice care.  Social worker has been consulted for hospice care.   Assessment & Plan:   Active Problems:   HTN (hypertension)   Diabetes mellitus ([place])   Cardiomyopathy ([place])   Hypokalemia   Debility   History of stroke   HLD (hyperlipidemia)   Vascular dementia with behavior disturbance (HCC)   Failure to thrive in adult   Chronic anemia   CKD (chronic kidney disease) stage 3, GFR 30-59 ml/min (HCC)   Dementia (HCC)   Protein-calorie malnutrition, severe   Pressure injury of skin   Chronic intestinal ischemia (HCC)   Enteritis, ischemic (HCC)   Ischemic enteritis (HCC)   HCAP (healthcare-associated pneumonia)   Hypocalcemia   AKI (acute kidney injury) ([place])  GI bleed, occult- also related?  Versus ischemic gut? Acute ischemic enteritis -Repeat CT of the abdomen pelvis shows worsening of intra-abdominal pathology.  Patient is not a surgical candidate.  Very poor prognosis overall.  We will continue IV fluids and IV Zosyn for now but at the time of  discharge will discontinue this.  Family wishes to transition patient to comfort care. -Family also does not want any Accu-Cheks and they are aware that patient may become hypoglycemic. - Oral medicine only if patient wakes up and takes it otherwise can hold off on that as well. -Diet as tolerated  UTI with hematuria -Continue Zosyn  Acute kidney injury on CKD stage III -Continue to monitor.  Anemia, likely chronic disease Generalized weakness -After 2 units of PRBC transfusion patient's hemoglobin increased at 10.5.  Today slightly higher which I suspect secondary to dehydration/hemoconcentration.  Hypomagnesemia/hypokalemia/Hypopho Hyperchloremia with mild acidosis -Replete as necessary  Hypoglycemia -Family does not want Accu-Cheks.  Continue D5 water  Advanced dementia Failure to thrive with severe protein calorie malnutrition -Encourage oral intake whenever she is able to.  P.o. medications if she takes it.  Plans on transition to residential hospice  Hyperlipidemia -Continue home meds statin  Essential hypertension - Medications on hold for now.  Diabetes mellitus type 2 with hypoglycemia secondary to poor oral intake -Discontinue Accu-Cheks and sliding scale  History of CVA -Aspirin on hold  Multiple pressure ulcers, unstageable - Routine care per nursing staff.  Had an extensive discussion with the patient's family including both the daughters yesterday on 12/26.  Family wants to proceed towards residential hospice therefore consultation has been placed.  Discontinue blood draws and Accu-Cheks at this time.  Comfort care order set has been placed.  Okay to continue antibiotics and IV fluids during hospitalization but will be discontinued at the time of discharge.  Family is aware of her poor prognosis.  DVT prophylaxis: SCDs Code Status: DNR/DNI Family Communication: None at bedside  Disposition Plan: Discharge to residential hospice when bed is  available  Consultants:   None  Procedures:   None  Antimicrobials:   Vancomycin discontinued 12/24  Zosyn day 4   Subjective: Patient is resting since she received her Xanax. Both the daughters at bedside is in agreement with current care plan.  They do not want any aggressive measures.  They wish patient to remain comfortable and for time being receive IV antibiotics and IV fluids but will be discontinued at the time of discharge. They are aware of the patient's life expectancy remains within hours to days especially with her poor oral intake, persistent intermittent hypoglycemia and intra-abdominal infection and pathology.  Review of Systems Otherwise negative except as per HPI, including: General = no fevers, chills, dizziness, malaise, fatigue HEENT/EYES = negative for pain, redness, loss of vision, double vision, blurred vision, loss of hearing, sore throat, hoarseness, dysphagia Cardiovascular= negative for chest pain, palpitation, murmurs, lower extremity swelling Respiratory/lungs= negative for shortness of breath, cough, hemoptysis, wheezing, mucus production Gastrointestinal= negative for nausea, vomiting,, abdominal pain, melena, hematemesis Genitourinary= negative for Dysuria, Hematuria, Change in Urinary Frequency MSK = Negative for arthralgia, myalgias, Back Pain, Joint swelling  Neurology= Negative for headache, seizures, numbness, tingling  Psychiatry= Negative for anxiety, depression, suicidal and homocidal ideation Allergy/Immunology= Medication/Food allergy as listed  Skin= Negative for Rash, lesions, ulcers, itching    Objective: Vitals:   09/16/18 1025 09/16/18 1500 09/16/18 1510 09/17/18 0600  BP: (!) 128/118 (!) 158/136 (!) 158/136 (!) 160/78  Pulse: 70  (!) 59 83  Resp: 18  20 20   Temp:   (!) 92.5 F (33.6 C)   TempSrc:   Rectal   SpO2: 97% 100% 100% (!) 51%  Weight:      Height:        Intake/Output Summary (Last 24 hours) at 09/17/2018  1107 Last data filed at 09/17/2018 0500 Gross per 24 hour  Intake -  Output 500 ml  Net -500 ml   Filed Weights   09/13/18 2001 09/13/18 2038  Weight: 57.6 kg 57.6 kg    Examination: Patient is finally resting well therefore family and I opted not to perform full physical examination as will not change the outcome.  They wish patient to be just remain comfortable. Data Reviewed:   CBC: Recent Labs  Lab 09/13/18 2009 09/14/18 0548 09/15/18 0512 09/16/18 0403 09/17/18 0416  WBC 27.5* 23.7* 22.0* 22.3* 22.8*  HGB 8.6* 7.6* 10.5* 13.5 12.9  HCT 26.2* 24.4* 31.4* 39.5 38.5  MCV 79.9* 83.6 81.8 82.3 82.6  PLT 399 306 321 245 093*   Basic Metabolic Panel: Recent Labs  Lab 09/13/18 2009 09/14/18 0548 09/15/18 0512 09/16/18 0403 09/17/18 0416  NA 141 141 142 144 140  K 3.1* 3.6 2.9* 3.6 3.9  CL 107 108 111 114* 109  CO2 21* 20* 18* 18* 18*  GLUCOSE 146* 106* 202* 136* 122*  BUN 19 18 13 12 11   CREATININE 1.40* 1.18* 1.17* 1.13* 0.97  CALCIUM 7.7* 7.7* 7.8* 8.0* 7.5*  MG 1.4* 1.3* 1.3* 2.0 1.6*  PHOS 2.8 2.1*  --  1.3*  --    GFR: Estimated Creatinine Clearance: 36.4 mL/min (by C-G formula based on SCr of 0.97 mg/dL). Liver Function Tests: Recent Labs  Lab 09/13/18 2009 09/14/18 2355 09/15/18 0512 09/16/18 0403 09/17/18 0416  AST 22 17 25 17 17   ALT 15 11 13 13  12  ALKPHOS 70 52 67 82 70  BILITOT 1.0 0.9 1.0 1.2 0.9  PROT 5.8* 5.0* 4.9* 5.0* 4.8*  ALBUMIN 2.0* 2.1* 1.9* 2.0* 1.8*   No results for input(s): LIPASE, AMYLASE in the last 168 hours. No results for input(s): AMMONIA in the last 168 hours. Coagulation Profile: Recent Labs  Lab 09/13/18 2009  INR 1.28   Cardiac Enzymes: No results for input(s): CKTOTAL, CKMB, CKMBINDEX, TROPONINI in the last 168 hours. BNP (last 3 results) No results for input(s): PROBNP in the last 8760 hours. HbA1C: No results for input(s): HGBA1C in the last 72 hours. CBG: Recent Labs  Lab 09/16/18 1023  09/16/18 1208 09/16/18 1344 09/16/18 1414 09/16/18 1615  GLUCAP 63* 68* 27* 116* 139*   Lipid Profile: No results for input(s): CHOL, HDL, LDLCALC, TRIG, CHOLHDL, LDLDIRECT in the last 72 hours. Thyroid Function Tests: No results for input(s): TSH, T4TOTAL, FREET4, T3FREE, THYROIDAB in the last 72 hours. Anemia Panel: No results for input(s): VITAMINB12, FOLATE, FERRITIN, TIBC, IRON, RETICCTPCT in the last 72 hours. Sepsis Labs: Recent Labs  Lab 09/13/18 2009 09/14/18 0548 09/17/18 0416  PROCALCITON  --  0.34  --   LATICACIDVEN 2.5*  --  3.7*    Recent Results (from the past 240 hour(s))  Culture, blood (routine x 2) Call MD if unable to obtain prior to antibiotics being given     Status: None (Preliminary result)   Collection Time: 09/13/18 10:00 PM  Result Value Ref Range Status   Specimen Description   Final    BLOOD RIGHT ARM Performed at [facility] [address] [place], [place] [address]    Special Requests   Final    BOTTLES DRAWN AEROBIC ONLY Blood Culture results may not be optimal due to an inadequate volume of blood received in culture bottles Performed at [facility] [address], [place], [place] [address]    Culture   Final    NO GROWTH 2 DAYS Performed at [facility] [address] [place], [place] [address]    Report Status PENDING  Incomplete  MRSA PCR Screening     Status: None   Collection Time: 09/13/18 10:16 PM  Result Value Ref Range Status   MRSA by PCR NEGATIVE NEGATIVE Final    Comment:        The GeneXpert MRSA Assay (FDA approved for NASAL specimens only), is one component of a comprehensive MRSA colonization surveillance program. It is not intended to diagnose MRSA infection nor to guide or monitor treatment for MRSA infections. Performed at [facility], [place] [address], [place], [place] [address]   Culture, blood (routine x 2) Call MD if unable to obtain  prior to antibiotics being given     Status: None (Preliminary result)   Collection Time: 09/13/18 11:44 PM  Result Value Ref Range Status   Specimen Description   Final    BLOOD LEFT WRIST Performed at [facility] [address], [place], [place] [address]    Special Requests   Final    BOTTLES DRAWN AEROBIC ONLY Blood Culture adequate volume Performed at [facility] [address], [place], [place] [address]    Culture   Final    NO GROWTH 2 DAYS Performed at [facility] [address] [place], [place] [address]    Report Status PENDING  Incomplete  Urine Culture     Status: None   Collection Time:  09/14/18  5:35 AM  Result Value Ref Range Status   Specimen Description   Final    URINE, CATHETERIZED Performed at [facility] [address] [place], [place] [address]    Special Requests   Final    NONE Performed at [facility], [place] [address] [place], [place] [address]    Culture   Final    Multiple bacterial morphotypes present, none predominant. Suggest appropriate recollection if clinically indicated.   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 EXAM: CT ABDOMEN AND PELVIS WITH CONTRAST TECHNIQUE: Multidetector CT imaging of the abdomen and pelvis was performed using the standard protocol following bolus administration of intravenous contrast. CONTRAST:  2mL OMNIPAQUE IOHEXOL 300 MG/ML SOLN, 74mL OMNIPAQUE IOHEXOL 300 MG/ML SOLN COMPARISON:  09/13/2018 FINDINGS: Lower chest: Moderate bilateral pleural effusions with passive atelectasis. Mild cardiomegaly. Right coronary artery atherosclerosis. Calcifications of the aortic valve noted. Implanted loop  recorder noted. Hepatobiliary: 6 mm gallstone in the gallbladder. Mild gallbladder wall thickening. Pancreas: Atrophic pancreas. Small cystic lesion or focal dilatation of the dorsal pancreatic duct in the pancreatic tail as noted on the prior exam. Spleen: Abnormal hypodensity posteromedially in the spleen, can not exclude a small splenic infarct. Otherwise unremarkable. Adrenals/Urinary Tract: Both adrenal glands appear normal. Mild left renal atrophy. No hydronephrosis or hydroureter. Contrast medium is present in the urinary bladder. Stomach/Bowel: Continued wall thickening in the stomach. Small low-density collections adjacent to the gastric wall likely represent fluid. There is abnormal infiltration of the adjacent omentum along the margin of the stomach. No gas within the gastric or bowel wall. There are air fluid levels in the distal colon and rectum compatible with diarrheal process. No dilated bowel. Scattered tiny sigmoid colon diverticula. There is bowel wall thickening in the cecum and ascending colon. Vascular/Lymphatic: Today's exam was not performed as a CT angiogram but rather is a standard diagnostic CT. As on the prior exam, there appears to be occlusion of the proximal SMA, with reconstitution about 1.7 cm from the origin. The celiac trunk is some NG severely diminutive and poorly seen the inferior mesenteric artery appears patent and collaterals from the IMA appear reconstitute upper abdominal vessels. Reproductive: Unremarkable Other: Moderate ascites. Infiltration of the omentum especially extending from the stomach towards the umbilicus. Low-density nodularity in the left upper quadrant omentum probably from small fluid collections. Musculoskeletal: Calcifications along the subcutaneous tissues the buttocks below the ischial tuberosity levels. Left femur intramedullary nail. Levoconvex thoracolumbar scoliosis. IMPRESSION: 1. Increase in ascites and increase in mesenteric  inflammation/infiltration especially extending from the gastric margin down to the umbilicus. 2. 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 There is reconstitution of the SMA about 1.7 cm distal to its origin, and also reconstitution of upper abdominal vessels primarily from the inferior mesenteric artery. 3. Gastric wall thickening especially along the greater curvature, where there is fluid along the gastric margin. A component of ischemic gastritis not be readily excluded. 4. Wall thickening in the cecum and ascending colon suggesting colitis, as well as distal air-fluid levels indicating a diarrheal process. Strictly speaking, and ischemic cause for this colon wall thickening is not readily excluded, although infection or inflammatory bowel disease are also possible. 5.  Cholelithiasis. Mild gallbladder wall thickening which could be from hypoproteinemia/hypoalbuminemia, or inflammation. 6. Aortic Atherosclerosis (ICD10-I70.0). Right coronary artery atherosclerosis and calcifications of the aortic valve. Mild cardiomegaly. 7. Atrophic pancreas. Small cystic lesion in the pancreatic tail, follow up recommendations from the 09/13/2018 exam are still in effect. 8. Suspected small splenic infarct. 9. Mild left renal atrophy. Electronically Signed   By: Van Clines M.D.   On: 09/16/2018 15:25        Scheduled Meds: . donepezil  10 mg Oral QHS  . escitalopram  10 mg Oral Daily  . folic acid  1 mg Oral Daily  . Gerhardt's butt cream   Topical TID  . mirtazapine  7.5 mg Oral QHS  . nutrition supplement (JUVEN)  1 packet Oral BID BM  . pantoprazole (PROTONIX) IV  40 mg Intravenous Q12H  . sodium chloride flush  10-40 mL Intracatheter Q12H   Continuous Infusions: . dextrose 50 mL/hr at 09/17/18 0658  . piperacillin-tazobactam 3.375 g (09/17/18 0702)     LOS: 4 days   Time spent= 25 mins    Ankit Arsenio Loader, MD [facility] Pager  [phone]   If 7PM-7AM, please contact night-coverage [email or web address] Password [email or web address] 09/17/2018, 11:07 AM

## 2018-09-17 NOTE — Discharge Summary (Signed)
Physician Discharge Summary  Cathy Taylor KKX:[id number] DOB: Oct 09, 1932 DOA: 09/13/2018  PCP: Maryella Shivers, MD  Admit date: 09/13/2018 Discharge date: 09/17/2018  Admitted From: Nursing home Disposition:   [facility] for residential hospice  Recommendations for Outpatient Follow-up:  1. Follow up with PCP in 1-2 weeks 2. Please obtain BMP/CBC in one week your next doctors visit.  3.  patient has been prescribed routine comfort care medications  Discharge Condition: Stable CODE STATUS: DNR/DNI Diet recommendation: Comfort feeding  Brief/Interim Summary: 84 year old with history of CKD, anemia, failure to thrive, diabetes mellitus type 2, grade 1 diastolic dysfunction, depression, pacemaker in place admitted for low hemoglobin of 7.1.  Transferred from [facility] for further care.  Had EGD done about a week ago showed severe gastritis, gastric ulcer, esophageal ulcer and duodenitis.  CT showed concerns for ischemic enteritis and SMA occlusion.  Started on broad-spectrum antibiotics.  Vascular surgery-not a surgical candidate.  Patient intermittently continued become hypoglycemic despite of getting D5 and D10 fluids.  She also received 2 units of PRBC transfusion since then her hemoglobin has remained stable around 10.  No obvious signs of bleeding.  Repeat CT showed worsening of intra-abdominal pathology.  At this time family is wishing to transition patient to hospice care.  Social worker has been consulted for hospice care. Family is opted to take the patient to [facility].  Arrangements will be made by the social worker for that transition.   Discharge Diagnoses:  Active Problems:   HTN (hypertension)   Diabetes mellitus ([place])   Cardiomyopathy ([place])   Hypokalemia   Debility   History of stroke   HLD (hyperlipidemia)   Vascular dementia with behavior disturbance (HCC)   Failure to thrive in adult   Chronic anemia   CKD (chronic kidney disease) stage 3, GFR 30-59 ml/min  (HCC)   Dementia (HCC)   Protein-calorie malnutrition, severe   Pressure injury of skin   Chronic intestinal ischemia (HCC)   Enteritis, ischemic (HCC)   Ischemic enteritis (HCC)   HCAP (healthcare-associated pneumonia)   Hypocalcemia   AKI (acute kidney injury) ([place])  GI bleed, occult- also related?  Versus ischemic gut? Acute ischemic enteritis -Repeat CT of the abdomen pelvis shows worsening of intra-abdominal pathology.  Patient is not a surgical candidate.  Very poor prognosis overall.    Discontinue IV fluids and antibiotics at the time of discharge. -Family also does not want any Accu-Cheks and they are aware that patient may become hypoglycemic. - Oral medicine only if patient wakes up and takes it otherwise can hold off on that as well. -Diet as tolerated  UTI with hematuria -Discontinue antibiotics at discharge  Acute kidney injury on CKD stage III -Continue to monitor.  Anemia, likely chronic disease Generalized weakness -After 2 units of PRBC transfusion patient's hemoglobin increased at 10.5.  Today slightly higher which I suspect secondary to dehydration/hemoconcentration.  Hypomagnesemia/hypokalemia/Hypopho Hyperchloremia with mild acidosis -Replete as necessary  Hypoglycemia -Family does not want Accu-Cheks.  Continue D5 water  Advanced dementia Failure to thrive with severe protein calorie malnutrition -Encourage oral intake as much as possible.  Hyperlipidemia -Continue home meds statin  Essential hypertension -  Resume home meds  Diabetes mellitus type 2 with hypoglycemia secondary to poor oral intake -Discontinue Accu-Cheks and sliding scale  History of CVA -Aspirin on hold  Multiple pressure ulcers, unstageable - Routine care per nursing staff.  Had an extensive discussion with the patient's family including both the daughters yesterday on 12/26.  Family wants  to proceed towards residential hospice therefore consultation has been  placed.  Discontinue blood draws and Accu-Cheks at this time.  Comfort care order set has been placed.  Okay to continue antibiotics and IV fluids during hospitalization but will be discontinued at the time of discharge.  Family is aware of her poor prognosis.  I highly encourage patient to discontinue as many oral medications as possible.  She only needs to use symptomatic treatment medications.  Discharge Instructions   Allergies as of 09/17/2018   No Known Allergies     Medication List    TAKE these medications   acetaminophen 325 MG tablet Commonly known as:  TYLENOL Take 650 mg by mouth every 6 (six) hours as needed (for pain).   aspirin 81 MG tablet Take 1 tablet (81 mg total) by mouth daily.   donepezil 10 MG tablet Commonly known as:  ARICEPT Take 10 mg by mouth at bedtime.   dronabinol 2.5 MG capsule Commonly known as:  MARINOL Take 1 capsule (2.5 mg total) by mouth 2 (two) times daily before lunch and supper.   escitalopram 10 MG tablet Commonly known as:  LEXAPRO Take 10 mg by mouth daily.   fenofibrate 145 MG tablet Commonly known as:  TRICOR Take 145 mg by mouth daily.   folic acid 1 MG tablet Commonly known as:  FOLVITE Take 1 mg by mouth daily.   haloperidol 0.5 MG tablet Commonly known as:  HALDOL Take 1 tablet (0.5 mg total) by mouth every 4 (four) hours as needed for up to 5 days for agitation (or delirium).   HYDROcodone-acetaminophen 5-325 MG tablet Commonly known as:  NORCO/VICODIN Take 1-2 tablets by mouth every 4 (four) hours as needed for up to 5 days for moderate pain.   LORazepam 1 MG tablet Commonly known as:  ATIVAN Take 1 tablet (1 mg total) by mouth every 4 (four) hours as needed for up to 5 days for anxiety.   mirtazapine 7.5 MG tablet Commonly known as:  REMERON Take 1 tablet (7.5 mg total) by mouth at bedtime.   morphine 20 MG/5ML solution Take 1.3 mLs (5.2 mg total) by mouth every 4 (four) hours as needed for up to 3 days for  pain.   NUTRITIONAL DRINK PO Take 240 mLs by mouth 2 (two) times daily. MEDPASS What changed:  Another medication with the same name was removed. Continue taking this medication, and follow the directions you see here.   ondansetron 4 MG tablet Commonly known as:  ZOFRAN Take 4 mg by mouth every 8 (eight) hours as needed for nausea or vomiting.   oxybutynin 5 MG tablet Commonly known as:  DITROPAN Take 0.5 tablets (2.5 mg total) by mouth 4 (four) times daily as needed for up to 15 doses for bladder spasms.   pantoprazole 40 MG tablet Commonly known as:  PROTONIX Take 1 tablet (40 mg total) by mouth 2 (two) times daily before a meal.   sucralfate 1 GM/10ML suspension Commonly known as:  CARAFATE Take 10 mLs (1 g total) by mouth 4 (four) times daily -  with meals and at bedtime.   vitamin C 500 MG tablet Commonly known as:  ASCORBIC ACID Take 500 mg by mouth daily.   Vitamin D-3 25 MCG (1000 UT) Caps Take 1,000 Units by mouth daily.   zinc sulfate 220 (50 Zn) MG capsule Take 220 mg by mouth daily.      Follow-up Information    Maryella Shivers, MD Follow up  in 1 week(s).   Specialty:  Family Medicine Contact information: [address] [place] [address] [phone]          No Known Allergies  You were cared for by a hospitalist during your hospital stay. If you have any questions about your discharge medications or the care you received while you were in the hospital after you are discharged, you can call the unit and asked to speak with the hospitalist on call if the hospitalist that took care of you is not available. Once you are discharged, your primary care physician will handle any further medical issues. Please note that no refills for any discharge medications will be authorized once you are discharged, as it is imperative that you return to your primary care physician (or establish a relationship with a primary care physician if you do not  have one) for your aftercare needs so that they can reassess your need for medications and monitor your lab values.  Consultations:  Vascular surgery   Procedures/Studies: Dg Chest 2 View  Result Date: 09/01/2018 CLINICAL DATA:  Hypoxia EXAM: CHEST - 2 VIEW COMPARISON:  08/27/2018 FINDINGS: Moderate bilateral pleural effusions. Mild cardiomegaly. Left base atelectasis. No overt edema. Loop recorder device projects over the left lower chest. Prior left shoulder replacement. Advanced degenerative changes in the right shoulder. No acute bony abnormality. IMPRESSION: Moderate bilateral pleural effusions.  Left base atelectasis. Mild cardiomegaly. Electronically Signed   By: Rolm Baptise M.D.   On: 09/01/2018 11:30   Dg Chest 2 View  Result Date: 08/24/2018 CLINICAL DATA:  Leukocytosis EXAM: CHEST - 2 VIEW COMPARISON:  07/03/2018 FINDINGS: Heart is upper limits normal in size. No confluent airspace opacities, effusions or edema. No acute bony abnormality. Advanced degenerative changes in the right shoulder. Prior left shoulder replacement. Loop recorder device in the left anterior chest wall. IMPRESSION: No active cardiopulmonary disease. Electronically Signed   By: Rolm Baptise M.D.   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 EXAM: CT ABDOMEN AND PELVIS WITH CONTRAST TECHNIQUE: Multidetector CT imaging of the abdomen and pelvis was performed using the standard protocol following bolus administration of intravenous contrast. CONTRAST:  7mL OMNIPAQUE IOHEXOL 300 MG/ML SOLN, 23mL OMNIPAQUE IOHEXOL 300 MG/ML SOLN COMPARISON:  09/13/2018 FINDINGS: Lower chest: Moderate bilateral pleural effusions with passive atelectasis. Mild cardiomegaly. Right coronary artery atherosclerosis. Calcifications of the aortic valve  noted. Implanted loop recorder noted. Hepatobiliary: 6 mm gallstone in the gallbladder. Mild gallbladder wall thickening. Pancreas: Atrophic pancreas. Small cystic lesion or focal dilatation of the dorsal pancreatic duct in the pancreatic tail as noted on the prior exam. Spleen: Abnormal hypodensity posteromedially in the spleen, can not exclude a small splenic infarct. Otherwise unremarkable. Adrenals/Urinary Tract: Both adrenal glands appear normal. Mild left renal atrophy. No hydronephrosis or hydroureter. Contrast medium is present in the urinary bladder. Stomach/Bowel: Continued wall thickening in the stomach. Small low-density collections adjacent to the gastric wall likely represent fluid. There is abnormal infiltration of the adjacent omentum along the margin of the stomach. No gas within the gastric or bowel wall. There are air fluid levels in the distal colon and rectum compatible with diarrheal process. No dilated bowel. Scattered tiny sigmoid colon diverticula. There is bowel wall thickening in the cecum and ascending colon. Vascular/Lymphatic: Today's exam was not performed as a  CT angiogram but rather is a standard diagnostic CT. As on the prior exam, there appears to be occlusion of the proximal SMA, with reconstitution about 1.7 cm from the origin. The celiac trunk is some NG severely diminutive and poorly seen the inferior mesenteric artery appears patent and collaterals from the IMA appear reconstitute upper abdominal vessels. Reproductive: Unremarkable Other: Moderate ascites. Infiltration of the omentum especially extending from the stomach towards the umbilicus. Low-density nodularity in the left upper quadrant omentum probably from small fluid collections. Musculoskeletal: Calcifications along the subcutaneous tissues the buttocks below the ischial tuberosity levels. Left femur intramedullary nail. Levoconvex thoracolumbar scoliosis. IMPRESSION: 1. Increase in ascites and increase in mesenteric  inflammation/infiltration especially extending from the gastric margin down to the umbilicus. 2. 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 There is reconstitution of the SMA about 1.7 cm distal to its origin, and also reconstitution of upper abdominal vessels primarily from the inferior mesenteric artery. 3. Gastric wall thickening especially along the greater curvature, where there is fluid along the gastric margin. A component of ischemic gastritis not be readily excluded. 4. Wall thickening in the cecum and ascending colon suggesting colitis, as well as distal air-fluid levels indicating a diarrheal process. Strictly speaking, and ischemic cause for this colon wall thickening is not readily excluded, although infection or inflammatory bowel disease are also possible. 5. Cholelithiasis. Mild gallbladder wall thickening which could be from hypoproteinemia/hypoalbuminemia, or inflammation. 6. Aortic Atherosclerosis (ICD10-I70.0). Right coronary artery atherosclerosis and calcifications of the aortic valve. Mild cardiomegaly. 7. Atrophic pancreas. Small cystic lesion in the pancreatic tail, follow up recommendations from the 09/13/2018 exam are still in effect. 8. Suspected small splenic infarct. 9. Mild left renal atrophy. Electronically Signed   By: Van Clines M.D.   On: 09/16/2018 15:25   Ct Abdomen Pelvis W Contrast  Result Date: 08/24/2018 CLINICAL DATA:  Nausea, vomiting EXAM: CT ABDOMEN AND PELVIS WITH CONTRAST TECHNIQUE: Multidetector CT imaging of the abdomen and pelvis was performed using the standard protocol following bolus administration of intravenous contrast. CONTRAST:  161mL OMNIPAQUE IOHEXOL 300 MG/ML  SOLN COMPARISON:  06/30/2018 FINDINGS: Lower chest: Lung bases are clear. No effusions. Heart is normal size. Hepatobiliary: Small layering stone within the gallbladder fundus. No focal hepatic abnormality or biliary ductal dilatation.  Pancreas: Mildly prominent pancreatic duct, with a focal dilated area in the pancreatic body measuring up to 6 mm. Pancreatic atrophy. No focal abnormality. Spleen: No focal abnormality.  Normal size. Adrenals/Urinary Tract: No adrenal abnormality. No focal renal abnormality. No stones or hydronephrosis. Urinary bladder is unremarkable. Stomach/Bowel: Stomach, large and small bowel grossly unremarkable. Vascular/Lymphatic: Aortic atherosclerosis. No enlarged abdominal or pelvic lymph nodes. Reproductive: Uterus and adnexa unremarkable.  No mass. Other: No free fluid or free air. Musculoskeletal: Degenerative changes in the lumbar spine. Leftward scoliosis. No acute bony abnormality. IMPRESSION: Small layering dependent gallstone within the gallbladder. No acute findings in the abdomen or pelvis. Aortic atherosclerosis. Electronically Signed   By: Rolm Baptise M.D.   On: 08/24/2018 19:21   Dg Chest Port 1 View  Result Date: 09/14/2018 CLINICAL DATA:  Dyspnea. EXAM: PORTABLE CHEST 1 VIEW COMPARISON:  09/13/2018. FINDINGS: Cardiomegaly. Loop recorder. Calcified tortuous aorta. LEFT shoulder replacement. Low lung volumes with probable subsegmental atelectasis. Minimal blunting BILATERAL CP angle is stable. Degenerative change RIGHT shoulder. IMPRESSION: Low lung volumes, otherwise stable chest. Electronically Signed   By: Staci Righter M.D.   On: 09/14/2018 09:15  Dg Chest Port 1 View  Result Date: 08/27/2018 CLINICAL DATA:  Shortness of Breath EXAM: PORTABLE CHEST 1 VIEW COMPARISON:  08/24/2018 FINDINGS: Cardiac shadow is within normal limits. The lungs are well aerated bilaterally without focal infiltrate. Loop recorder is again noted and stable. Aortic calcifications are seen. No acute bony abnormality is noted. Postsurgical changes in left shoulder are seen. IMPRESSION: No acute abnormality noted. Electronically Signed   By: Inez Catalina M.D.   On: 08/27/2018 13:01   [place] Ekg Site Rite  Result Date:  09/14/2018 If Site Rite image not attached, placement could not be confirmed due to current cardiac rhythm.     Subjective: Patient is laying in the bed and appears to be comfortable.  Does not appear to be any in overall distress.  Still continues to have poor oral intake   Discharge Exam: Vitals:   09/16/18 1510 09/17/18 0600  BP: (!) 158/136 (!) 160/78  Pulse: (!) 59 83  Resp: 20 20  Temp: (!) 92.5 F (33.6 C)   SpO2: 100% (!) 51%   Vitals:   09/16/18 1025 09/16/18 1500 09/16/18 1510 09/17/18 0600  BP: (!) 128/118 (!) 158/136 (!) 158/136 (!) 160/78  Pulse: 70  (!) 59 83  Resp: 18  20 20   Temp:   (!) 92.5 F (33.6 C)   TempSrc:   Rectal   SpO2: 97% 100% 100% (!) 51%  Weight:      Height:        General: Sleeping, not in acute distress Cardiovascular: RRR, S1/S2 +, no rubs, no gallops Respiratory: Diminished breath sounds at the bases and overall poor respiratory/shallow effort. Abdominal: Abdomen is soft but slightly tender to deep palpation. Extremities: no edema, no cyanosis Multiple unstageable ulcers noted on her heel and her lower back.   The results of significant diagnostics from this hospitalization (including imaging, microbiology, ancillary and laboratory) are listed below for reference.     Microbiology: Recent Results (from the past 240 hour(s))  Culture, blood (routine x 2) Call MD if unable to obtain prior to antibiotics being given     Status: None (Preliminary result)   Collection Time: 09/13/18 10:00 PM  Result Value Ref Range Status   Specimen Description   Final    BLOOD RIGHT ARM Performed at [facility] [address] [place], [place] [address]    Special Requests   Final    BOTTLES DRAWN AEROBIC ONLY Blood Culture results may not be optimal due to an inadequate volume of blood received in culture bottles Performed at [facility] [address] [place], [place] [address]    Culture   Final     NO GROWTH 3 DAYS Performed at [facility], [place] [address], [place], [place] [address]    Report Status PENDING  Incomplete  MRSA PCR Screening     Status: None   Collection Time: 09/13/18 10:16 PM  Result Value Ref Range Status   MRSA by PCR NEGATIVE NEGATIVE Final    Comment:        The GeneXpert MRSA Assay (FDA approved for NASAL specimens only), is one component of a comprehensive MRSA colonization surveillance program. It is not intended to diagnose MRSA infection nor to guide or monitor treatment for MRSA infections. Performed at [facility], [place] [address] [place], [place] [address]   Culture, blood (routine x 2) Call MD if unable to obtain prior to antibiotics being given  Status: None (Preliminary result)   Collection Time: 09/13/18 11:44 PM  Result Value Ref Range Status   Specimen Description   Final    BLOOD LEFT WRIST Performed at [facility] [address] [place], [place] [address]    Special Requests   Final    BOTTLES DRAWN AEROBIC ONLY Blood Culture adequate volume Performed at [facility] [address], [place], [place] [address]    Culture   Final    NO GROWTH 3 DAYS Performed at [facility], [place] [address], [place], [place] [address]    Report Status PENDING  Incomplete  Urine Culture     Status: None   Collection Time: 09/14/18  5:35 AM  Result Value Ref Range Status   Specimen Description   Final    URINE, CATHETERIZED Performed at [facility] [address], [place], [place] [address]    Special Requests   Final    NONE Performed at [facility], [place] [address], [place], [place] [address]    Culture   Final    Multiple bacterial morphotypes present, none predominant. Suggest appropriate recollection if clinically indicated.   Report Status 09/15/2018 FINAL  Final     Labs: BNP (last 3 results) Recent Labs     07/03/18 0447  BNP 628.3*   Basic Metabolic Panel: Recent Labs  Lab 09/13/18 2009 09/14/18 0548 09/15/18 0512 09/16/18 0403 09/17/18 0416  NA 141 141 142 144 140  K 3.1* 3.6 2.9* 3.6 3.9  CL 107 108 111 114* 109  CO2 21* 20* 18* 18* 18*  GLUCOSE 146* 106* 202* 136* 122*  BUN 19 18 13 12 11   CREATININE 1.40* 1.18* 1.17* 1.13* 0.97  CALCIUM 7.7* 7.7* 7.8* 8.0* 7.5*  MG 1.4* 1.3* 1.3* 2.0 1.6*  PHOS 2.8 2.1*  --  1.3*  --    Liver Function Tests: Recent Labs  Lab 09/13/18 2009 09/14/18 0548 09/15/18 0512 09/16/18 0403 09/17/18 0416  AST 22 17 25 17 17   ALT 15 11 13 13 12   ALKPHOS 70 52 67 82 70  BILITOT 1.0 0.9 1.0 1.2 0.9  PROT 5.8* 5.0* 4.9* 5.0* 4.8*  ALBUMIN 2.0* 2.1* 1.9* 2.0* 1.8*   No results for input(s): LIPASE, AMYLASE in the last 168 hours. No results for input(s): AMMONIA in the last 168 hours. CBC: Recent Labs  Lab 09/13/18 2009 09/14/18 0548 09/15/18 0512 09/16/18 0403 09/17/18 0416  WBC 27.5* 23.7* 22.0* 22.3* 22.8*  HGB 8.6* 7.6* 10.5* 13.5 12.9  HCT 26.2* 24.4* 31.4* 39.5 38.5  MCV 79.9* 83.6 81.8 82.3 82.6  PLT 399 306 321 245 144*   Cardiac Enzymes: No results for input(s): CKTOTAL, CKMB, CKMBINDEX, TROPONINI in the last 168 hours. BNP: Invalid input(s): POCBNP CBG: Recent Labs  Lab 09/16/18 1023 09/16/18 1208 09/16/18 1344 09/16/18 1414 09/16/18 1615  GLUCAP 63* 68* 27* 116* 139*   D-Dimer No results for input(s): DDIMER in the last 72 hours. Hgb A1c No results for input(s): HGBA1C in the last 72 hours. Lipid Profile No results for input(s): CHOL, HDL, LDLCALC, TRIG, CHOLHDL, LDLDIRECT in the last 72 hours. Thyroid function studies No results for input(s): TSH, T4TOTAL, T3FREE, THYROIDAB in the last 72 hours.  Invalid input(s): FREET3 Anemia work up No results for input(s): VITAMINB12, FOLATE, FERRITIN, TIBC, IRON, RETICCTPCT in the last 72 hours. Urinalysis    Component Value Date/Time   COLORURINE AMBER (A)  09/14/2018 6629  APPEARANCEUR TURBID (A) 09/14/2018 0534   LABSPEC 1.043 (H) 09/14/2018 0534   PHURINE 6.0 09/14/2018 0534   GLUCOSEU NEGATIVE 09/14/2018 0534   HGBUR LARGE (A) 09/14/2018 0534   BILIRUBINUR NEGATIVE 09/14/2018 0534   KETONESUR NEGATIVE 09/14/2018 0534   PROTEINUR 30 (A) 09/14/2018 0534   UROBILINOGEN 1.0 05/04/2015 1535   NITRITE NEGATIVE 09/14/2018 0534   LEUKOCYTESUR LARGE (A) 09/14/2018 0534   Sepsis Labs Invalid input(s): PROCALCITONIN,  WBC,  LACTICIDVEN Microbiology Recent Results (from the past 240 hour(s))  Culture, blood (routine x 2) Call MD if unable to obtain prior to antibiotics being given     Status: None (Preliminary result)   Collection Time: 09/13/18 10:00 PM  Result Value Ref Range Status   Specimen Description   Final    BLOOD RIGHT ARM Performed at [facility], [place] [address], [place], [place] [address]    Special Requests   Final    BOTTLES DRAWN AEROBIC ONLY Blood Culture results may not be optimal due to an inadequate volume of blood received in culture bottles Performed at [facility] [address], [place], [place] [address]    Culture   Final    NO GROWTH 3 DAYS Performed at [facility], [place] [address], [place], [place] [address]    Report Status PENDING  Incomplete  MRSA PCR Screening     Status: None   Collection Time: 09/13/18 10:16 PM  Result Value Ref Range Status   MRSA by PCR NEGATIVE NEGATIVE Final    Comment:        The GeneXpert MRSA Assay (FDA approved for NASAL specimens only), is one component of a comprehensive MRSA colonization surveillance program. It is not intended to diagnose MRSA infection nor to guide or monitor treatment for MRSA infections. Performed at [facility], [place] [address] [place], [place] [address]   Culture, blood (routine x 2) Call MD if unable to obtain prior to antibiotics being given     Status: None (Preliminary  result)   Collection Time: 09/13/18 11:44 PM  Result Value Ref Range Status   Specimen Description   Final    BLOOD LEFT WRIST Performed at [facility] [address], [place], [place] [address]    Special Requests   Final    BOTTLES DRAWN AEROBIC ONLY Blood Culture adequate volume Performed at [facility] [address], [place], [place] [address]    Culture   Final    NO GROWTH 3 DAYS Performed at [facility], [place] [address], [place], [place] [address]    Report Status PENDING  Incomplete  Urine Culture     Status: None   Collection Time: 09/14/18  5:35 AM  Result Value Ref Range Status   Specimen Description   Final    URINE, CATHETERIZED Performed at [facility] [address] [place], [place] [address]    Special Requests   Final    NONE Performed at [facility], [place] [address] [place],  [address]    Culture   Final    Multiple bacterial morphotypes present, none predominant. Suggest appropriate recollection if clinically indicated.   Report Status 09/15/2018 FINAL  Final     Time coordinating discharge:  I have spent 35 minutes face to face with the patient and on the ward discussing the patients care, assessment, plan and disposition with other care givers. >50% of the time  was devoted counseling the patient about the risks and benefits of treatment/Discharge disposition and coordinating care.   SIGNED:   Damita Lack, MD  [facility] 09/17/2018, 12:49 PM Pager   If 7PM-7AM, please contact night-coverage [email or web address] Password [email or web address]

## 2018-09-17 NOTE — Progress Notes (Signed)
Pharmacy Antibiotic Note  Cathy Taylor is a 84 y.o. female admitted on 09/13/2018 with intraabdominal infection and HAP.  Pharmacy had been consulted for Zosyn dosing.  Today, 09/17/18  Day 4 Zosyn  WBC 22.8 elevated essentially unchanged  SCR 0.97 improved CrCl 36  Single lumen PICC placed 12/24  Received request to change from zosyn extended infusion to traditional dosing due to limited IV access.  Plan:   Continue piperacillin/tazobactam 3.375 g IV q6h infused over 30 minutes.  Height: 5\' 3"  (160 cm) Weight: 127 lb (57.6 kg) IBW/kg (Calculated) : 52.4  Temp (24hrs), Avg:92.5 F (33.6 C), Min:92.5 F (33.6 C), Max:92.5 F (33.6 C)  Recent Labs  Lab 09/13/18 2009 09/14/18 0548 09/15/18 0512 09/16/18 0403 09/17/18 0416  WBC 27.5* 23.7* 22.0* 22.3* 22.8*  CREATININE 1.40* 1.18* 1.17* 1.13* 0.97  LATICACIDVEN 2.5*  --   --   --  3.7*    Estimated Creatinine Clearance: 36.4 mL/min (by C-G formula based on SCr of 0.97 mg/dL).    No Known Allergies  Antimicrobials this admission: 12/23 Zosyn >>  Dose adjustments this admission: -12/24: Changed from zosyn 3.375 g IV q8h EI to 3.375 g IV q6h 30 min infusion  Microbiology results: -12/23: Blood: no growth < 12 hrs -12/23: MRSA PCR negative  Thank you for allowing pharmacy to be a part of this patient's care.  Adrian Saran, PharmD, BCPS 09/17/2018 10:16 AM

## 2018-09-18 DIAGNOSIS — A419 Sepsis, unspecified organism: Secondary | ICD-10-CM

## 2018-09-18 DIAGNOSIS — D62 Acute posthemorrhagic anemia: Secondary | ICD-10-CM

## 2018-09-18 DIAGNOSIS — R6520 Severe sepsis without septic shock: Secondary | ICD-10-CM

## 2018-09-18 DIAGNOSIS — G9340 Encephalopathy, unspecified: Secondary | ICD-10-CM

## 2018-09-18 DIAGNOSIS — K659 Peritonitis, unspecified: Secondary | ICD-10-CM

## 2018-09-18 DIAGNOSIS — E872 Acidosis, unspecified: Secondary | ICD-10-CM

## 2018-09-18 MED ORDER — GERHARDT'S BUTT CREAM: 1.0000 "application " | TOPICAL_CREAM | Freq: Three times a day (TID) | CUTANEOUS | Status: AC

## 2018-09-18 MED ORDER — HEPARIN SOD (PORK) LOCK FLUSH 100 UNIT/ML IV SOLN
250.0000 [IU] | INTRAVENOUS | Status: AC | PRN
  Administered 2018-09-18: 250 [IU]

## 2018-09-19 LAB — CULTURE, BLOOD (ROUTINE X 2)
Culture: NO GROWTH
Culture: NO GROWTH
SPECIAL REQUESTS: ADEQUATE

## 2018-09-20 ENCOUNTER — Telehealth: Payer: Self-pay | Admitting: Nurse Practitioner

## 2018-09-20 ENCOUNTER — Ambulatory Visit (INDEPENDENT_AMBULATORY_CARE_PROVIDER_SITE_OTHER): Payer: Medicare Other

## 2018-09-20 DIAGNOSIS — I63139 Cerebral infarction due to embolism of unspecified carotid artery: Secondary | ICD-10-CM | POA: Diagnosis not present

## 2018-09-20 NOTE — Telephone Encounter (Signed)
Cathy Taylor/[facility] called to advise the patient passed Oct 13, 2018.  FYI

## 2018-09-20 NOTE — Telephone Encounter (Signed)
noted 

## 2018-09-20 NOTE — Progress Notes (Signed)
[facility] Summary Report / Loop Recorder 

## 2018-09-20 NOTE — Telephone Encounter (Signed)
Pt deceased as of 10-15-2018

## 2018-09-21 LAB — CUP PACEART REMOTE DEVICE CHECK
Date Time Interrogation Session: 20191230110842
Implantable Pulse Generator Implant Date: 20160816

## 2018-09-22 NOTE — Progress Notes (Signed)
Patient discharging to [facility]. Confirmed bed with facility, family aware of d/c plan. [facility] will be used for transport.  RN call report: [phone]  [place] signing off.  Pricilla Holm, MSW, [place] [facility] [phone]

## 2018-09-22 NOTE — Progress Notes (Signed)
[facility] room is available for patient today. Met with daughters Ivin Booty and Jenny Reichmann in conference room to complete paperwork for transfer today. Discharge summary and additional progress note has been sent to [facility].  RN please call report to [phone].  Thank you,  Erling Conte, LCSW [phone]

## 2018-09-22 NOTE — Progress Notes (Addendum)
See updated discharge medications below, otherwise see Dr. Latina Craver discharge summary 12/27 for details of hospitalization.  Allergies as of October 12, 2018   No Known Allergies     Medication List    STOP taking these medications   aspirin 81 MG tablet   donepezil 10 MG tablet Commonly known as:  ARICEPT   dronabinol 2.5 MG capsule Commonly known as:  MARINOL   escitalopram 10 MG tablet Commonly known as:  LEXAPRO   feeding supplement (ENSURE ENLIVE) Liqd   fenofibrate 145 MG tablet Commonly known as:  TRICOR   folic acid 1 MG tablet Commonly known as:  FOLVITE   mirtazapine 7.5 MG tablet Commonly known as:  REMERON   NUTRITIONAL DRINK PO   ondansetron 4 MG tablet Commonly known as:  ZOFRAN   pantoprazole 40 MG tablet Commonly known as:  PROTONIX   sucralfate 1 GM/10ML suspension Commonly known as:  CARAFATE   vitamin C 500 MG tablet Commonly known as:  ASCORBIC ACID   Vitamin D-3 25 MCG (1000 UT) Caps   zinc sulfate 220 (50 Zn) MG capsule     TAKE these medications   acetaminophen 325 MG tablet Commonly known as:  TYLENOL Take 650 mg by mouth every 6 (six) hours as needed (for pain).   Gerhardt's butt cream Crea Apply 1 application topically 3 (three) times daily. Apply in a thin layer to buttocks, perineum and medial thighs after cleansing with house incontinence care products and gently patting dry.   haloperidol 0.5 MG tablet Commonly known as:  HALDOL Take 1 tablet (0.5 mg total) by mouth every 4 (four) hours as needed for up to 5 days for agitation (or delirium).   LORazepam 1 MG tablet Commonly known as:  ATIVAN Take 1 tablet (1 mg total) by mouth every 4 (four) hours as needed for up to 5 days for anxiety.   morphine 20 MG/5ML solution Take 1.3 mLs (5.2 mg total) by mouth every 4 (four) hours as needed for up to 3 days for pain.      Murray Hodgkins, MD [facility] [phone]

## 2018-09-22 NOTE — Progress Notes (Signed)
Nurse called report to [place] at [facility].  SW has prepared discharge packet. Family aware of discharge and are at bedside. [facility] to transport pt.

## 2018-09-22 NOTE — Progress Notes (Addendum)
PROGRESS NOTE  Cathy Taylor WIO:[id number] DOB: 23-Jan-1933 DOA: 09/13/2018 PCP: Maryella Shivers, MD  Brief Narrative: 85 year old woman PMH including diabetes, pacemaker, CKD, admitted for anemia as a transfer from [facility] for further evaluation of anemia.  CT concerning for ischemic enteritis and SMA occlusion.  Treated with antibiotics, per chart, general surgery deemed patient not a surgical candidate.  Hospitalization complicated by poor oral intake and hypoglycemia.  Further imaging showed worsening of intra-abdominal pathology and family elected to proceed with comfort care 12/26 although wished also to continue IV fluids and antibiotics at that time.  Arrangements are being made for transfer to [facility] when bed available. Previous hospitalization early December had EGD which showed severe gastritis, gastric ulcer, esophageal ulcers.    Assessment/Plan Acute ischemic enteritis, lactic acidosis, metabolic acidosis, presume peritonitis, developing sepsis.  Per surgery not a candidate for intervention.  Persistent leukocytosis noted. 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  Transfused 2 units PRBC this hospitalization, hemoglobin remained stable.  Underwent EGD 12/5 which showed multiple esophageal ulcers with acute gastritis.  Treated with oral Protonix and Carafate. --Status post transfusion 2 units PRBC during this hospitalization, hemoglobin remained stable.  No evaluation planned.  Full comfort care per family.  UTI initially suspected with hematuria, treated with antibiotics, however culture  was unrevealing. --Treated with Zosyn  Acute kidney injury superimposed on CKD stage III --Resolved.  Secondary to poor oral intake most likely  Hypoglycemia --Family does not desire any further blood sugar checks.  Has been on D5.  Advanced dementia, adult failure to thrive with severe protein calorie malnutrition.  TSH within normal limits. --Full comfort care.  Diabetes mellitus type 2 with hypoglycemia secondary to poor oral intake.  Hemoglobin A1c less than 4.2. --CBG checks discontinued based on goals of care  PMH CVA, aspirin on hold  Multiple pressure ulcers, bilateral heel unstageable pressure injuries --Wound care per nursing.    Blood draws, Accu-Cheks were discontinued 12/27.  Comfort care order set placed 12/26.  Antibiotics and fluids were continued overnight but with plans to discontinue at discharge.  However this a.m., 2 daughters at bedside, granddaughter and son-in-law all request discontinuation of antibiotics and IV fluids with focus on full comfort care, discontinuation of all measures otherwise.  They are interested in residential hospice.  Stop Aricept, Lexapro, folic acid, Remeron, Ditropan, Protonix  DVT prophylaxis: Not indicated Code Status: DNR/DNI Family Communication: Discussed the above with 2 daughters at bedside, granddaughter and son-in-law at bedside Disposition Plan: Likely residential hospice   Murray Hodgkins, MD  [facility] Direct contact: [phone] --[place]  --[email or web address]; password [email or web address]  7PM-7AM contact night coverage as above 09-24-18, 11:48 AM  LOS: 5 days   Consultants:    Procedures:  PICC line 12/24  Antimicrobials:  Zosyn  Interval history/Subjective: Resting per family.  Occasionally opens eyes.  Comfortable per nursing, has not required analgesics during this shift.  Objective: Vitals:  Vitals:   09/17/18 2326 24-Sep-2018 0515  BP:  97/67  Pulse:  81  Resp:  19  SpO2: 97% 94%     Exam:  Constitutional:  . Appears calm and comfortable Respiratory:  . CTA  bilaterally, no w/r/r.  . Respiratory effort irregular  Cardiovascular:  . RRR, no m/r/g Abdomen:  . Abdomen appears normal, soft, nontender, no audible bowel sounds. Psychiatric:  . Mental status . Mood, affect not assessable, briefly opens eyes to voice   I have personally reviewed the following:   Data: . No labs today . Last hypoglycemic event recorded 12/26  Scheduled Meds: . Gerhardt's butt cream   Topical TID  . nutrition supplement (JUVEN)  1 packet Oral BID BM  . sodium chloride flush  10-40 mL Intracatheter Q12H   Continuous Infusions:   Principal Problem:   Ischemic enteritis (HCC) Active Problems:   Diabetes mellitus ([place])   Debility   History of stroke   Vascular dementia with behavior disturbance (HCC)   Failure to thrive in adult   Chronic anemia   CKD (chronic kidney disease) stage 3, GFR 30-59 ml/min (HCC)   Protein-calorie malnutrition, severe   Pressure injury of skin   Chronic intestinal ischemia (HCC)   HCAP (healthcare-associated pneumonia)   Lactic acid acidosis   Peritonitis (HCC)   Sepsis (HCC)   Acute blood loss anemia   LOS: 5 days     Time spent in review of chart, coordination of care, discussion with family total 40 minutes.

## 2018-09-22 DEATH — deceased

## 2018-09-24 NOTE — Telephone Encounter (Signed)
Noted thanks °

## 2018-10-03 LAB — CUP PACEART REMOTE DEVICE CHECK
Date Time Interrogation Session: 20191127110659
Implantable Pulse Generator Implant Date: 20160816

## 2018-11-01 ENCOUNTER — Ambulatory Visit: Payer: Medicare Other | Admitting: Neurology

## 2018-11-02 ENCOUNTER — Ambulatory Visit: Payer: Medicare Other | Admitting: Nurse Practitioner

## 2019-02-13 IMAGING — DX DG CHEST 2V
2 series · 2 of 2 positions shown · non-contrast
Comparison: 07/03/2018

CLINICAL DATA: Leukocytosis

EXAM:
CHEST - 2 VIEW

[w chest lat]
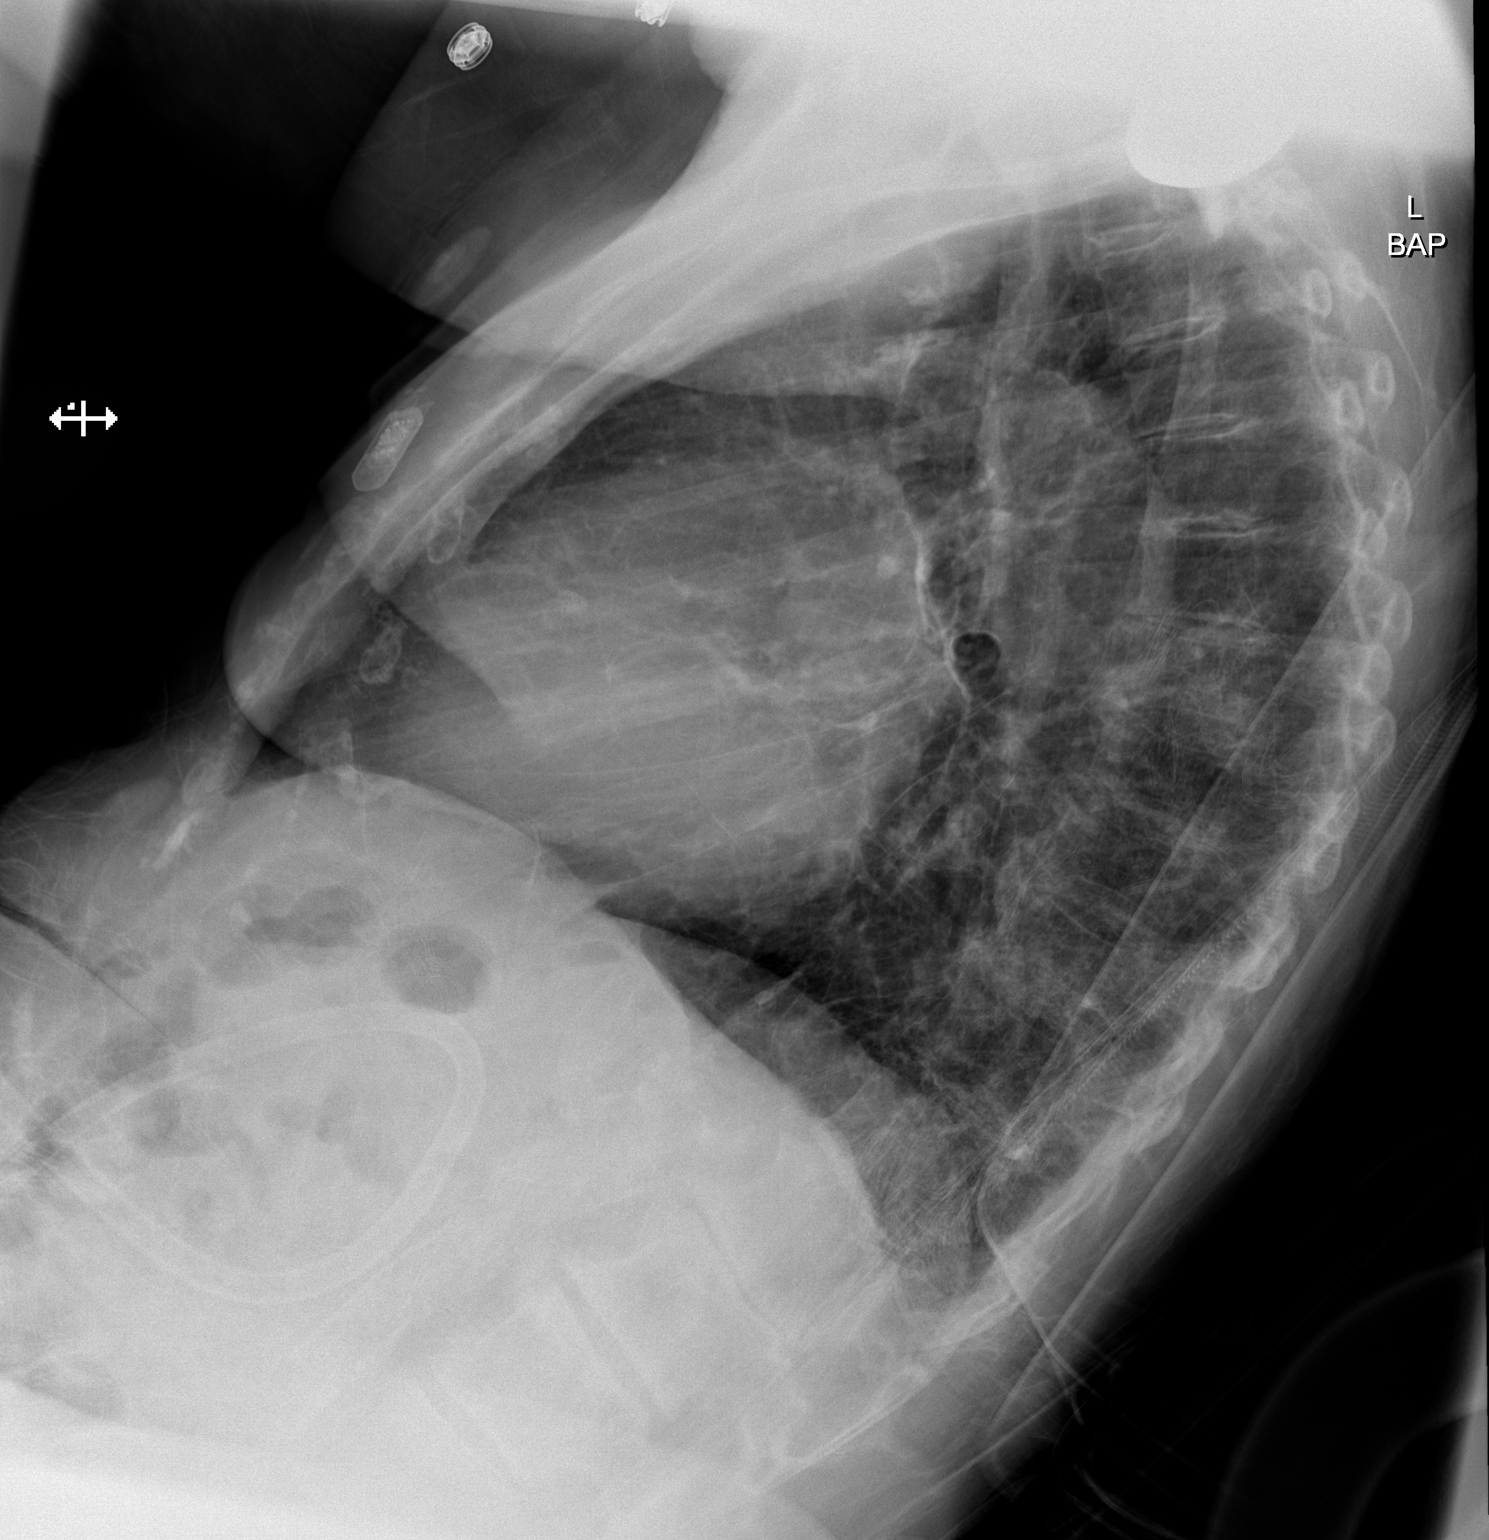

[x chest ap]
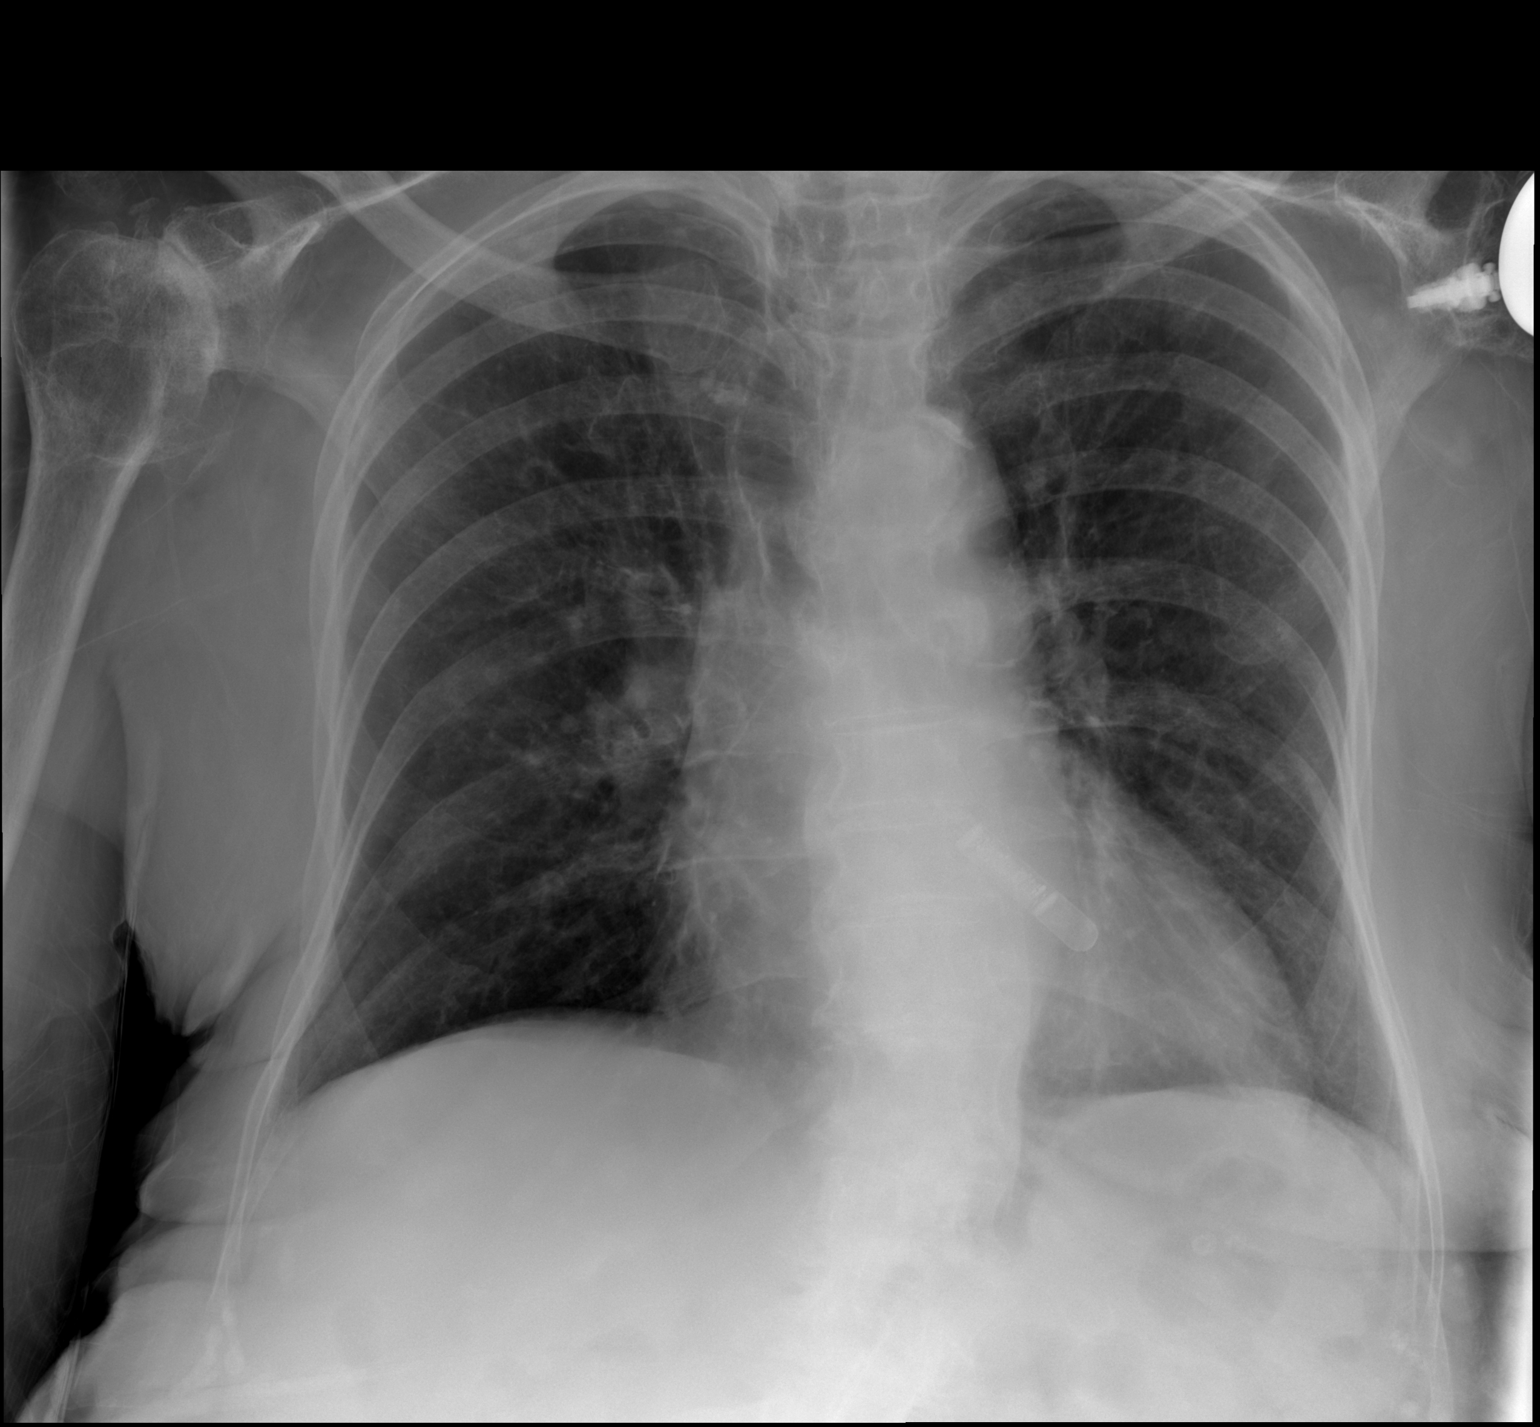

[2 of 2 positions shown; findings below may reference images not displayed]

FINDINGS: Heart is upper limits normal in size. No confluent airspace
opacities, effusions or edema. No acute bony abnormality. Advanced
degenerative changes in the right shoulder. Prior left shoulder
replacement. Loop recorder device in the left anterior chest wall.
IMPRESSION: No active cardiopulmonary disease.

## 2019-02-13 IMAGING — CT CT ABD-PELV W/ CM
2 of 5 series · 17 of 46 positions shown, 19 images · IV contrast (APPLIED)
Comparison: 06/30/2018

CLINICAL DATA: Nausea, vomiting

EXAM:
CT ABDOMEN AND PELVIS WITH CONTRAST
TECHNIQUE: Multidetector CT imaging of the abdomen and pelvis was performed
using the standard protocol following bolus administration of
intravenous contrast.
CONTRAST:  100mL OMNIPAQUE IOHEXOL 300 MG/ML  SOLN

[Series 3: abdomen 5.0 · axial · 0.88mm/px · z∈[-440,-100]mm · 14 of 78 slices shown, 16 images]
[im 5/78  soft-tissue]
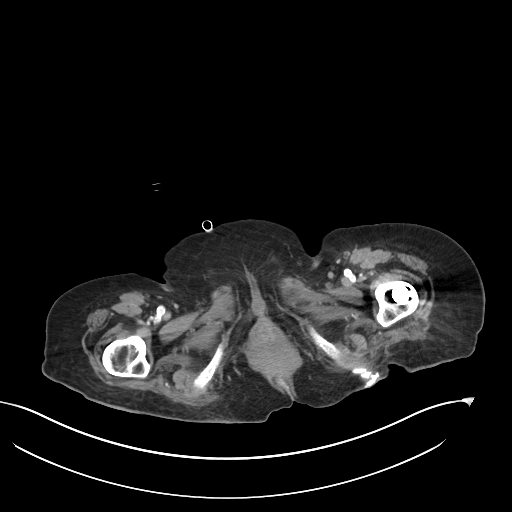
[im 5/78  bone]
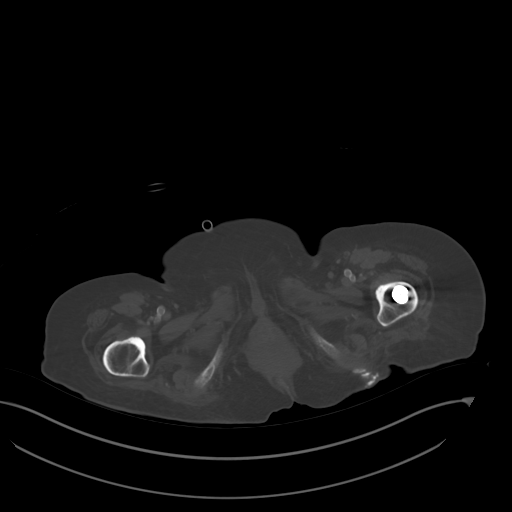
[im 10/78  soft-tissue]
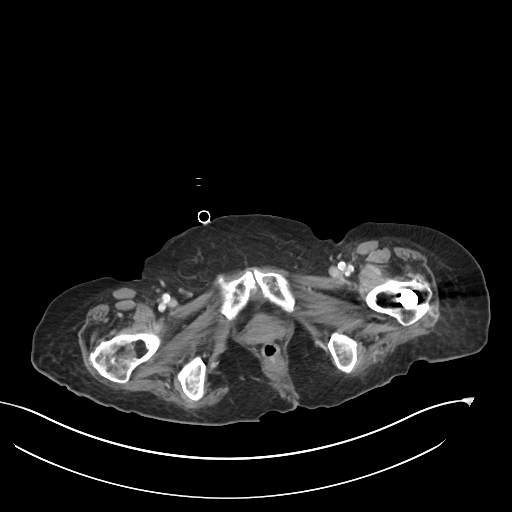
[im 15/78  soft-tissue]
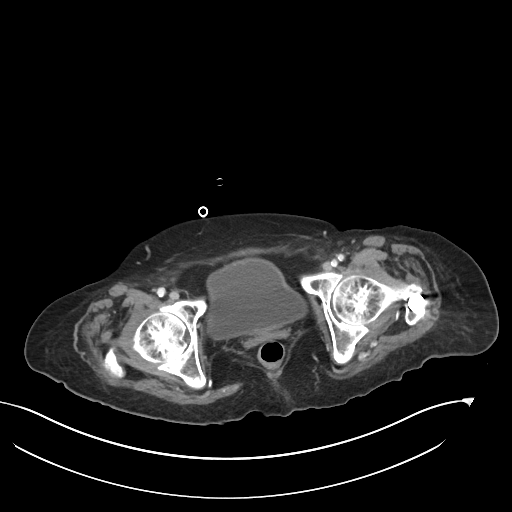
[im 20/78  soft-tissue]
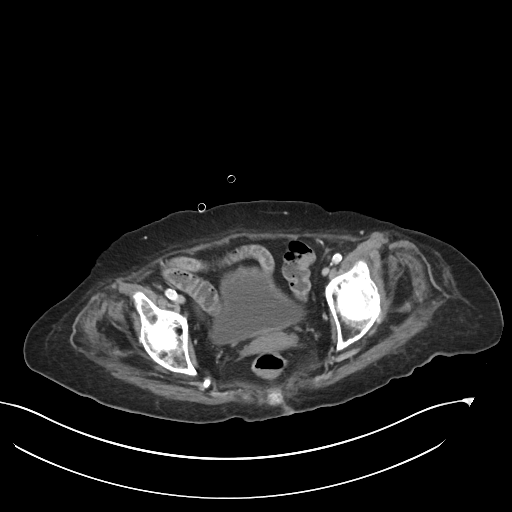
[im 25/78  soft-tissue]
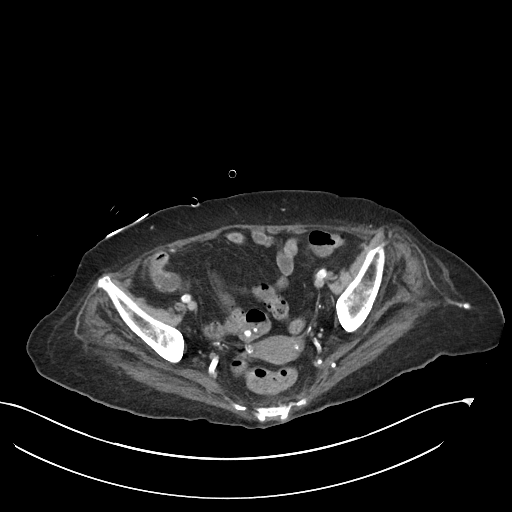
[im 29/78  soft-tissue]
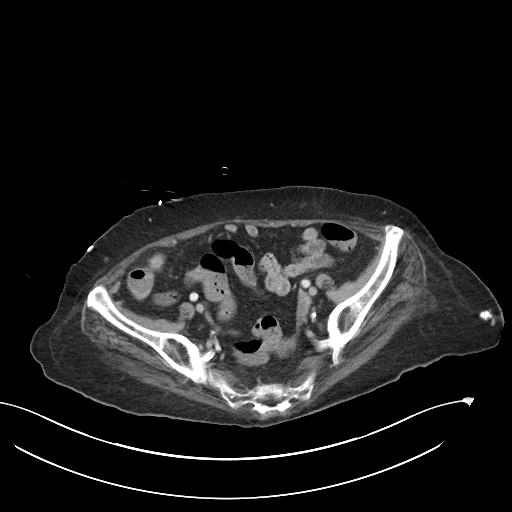
[im 34/78  soft-tissue]
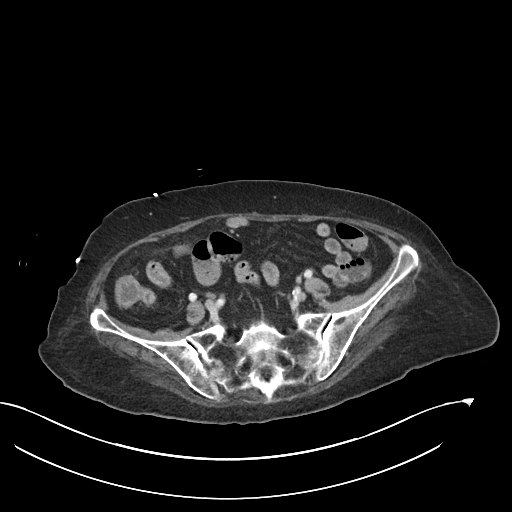
[im 44/78  soft-tissue]
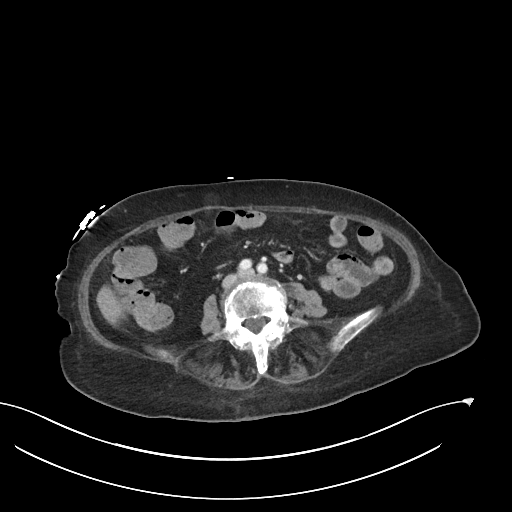
[im 49/78  soft-tissue]
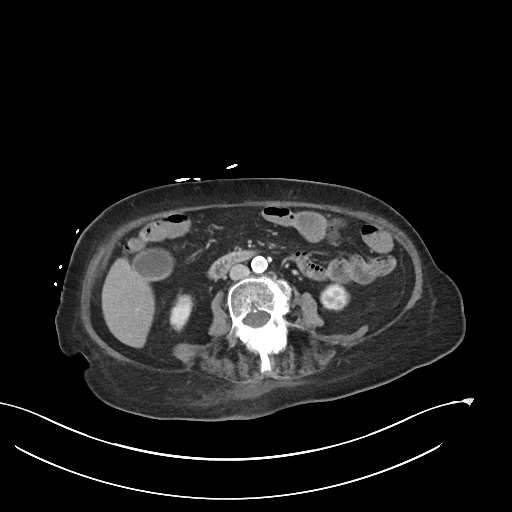
[im 49/78  bone]
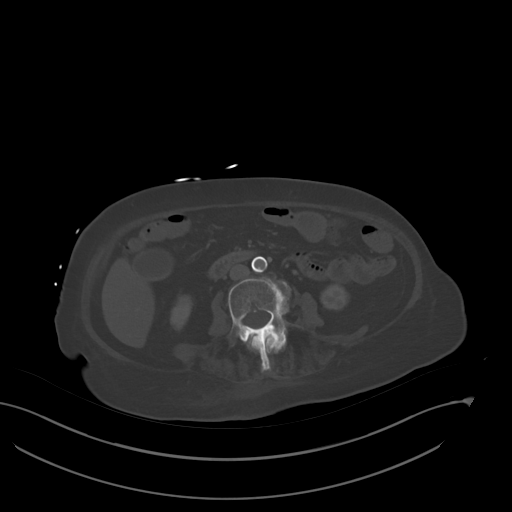
[im 53/78  soft-tissue]
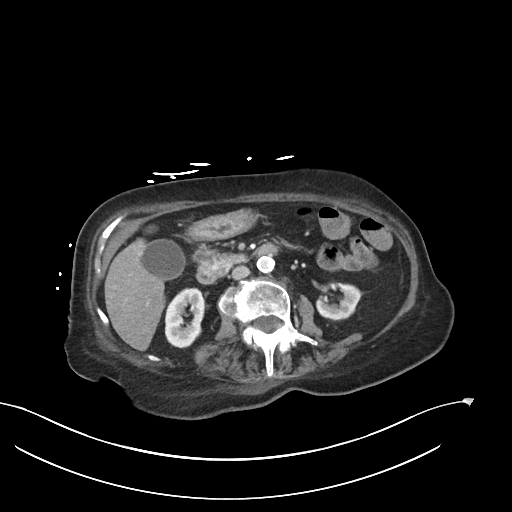
[im 58/78  soft-tissue]
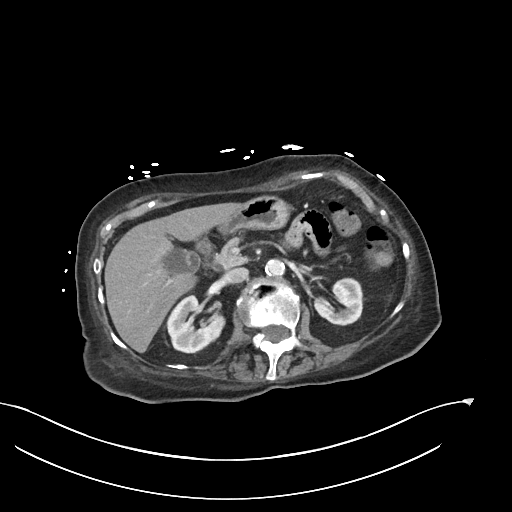
[im 63/78  soft-tissue]
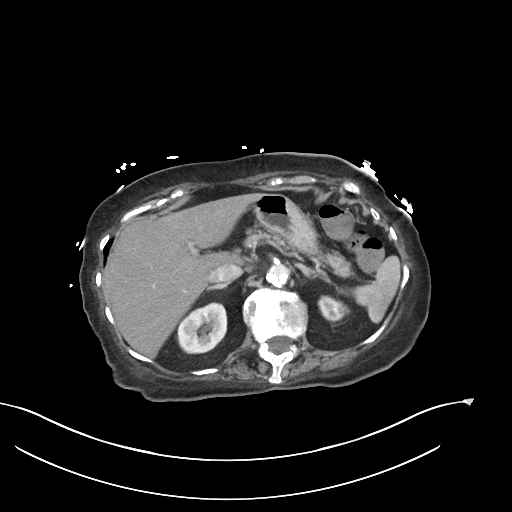
[im 68/78  soft-tissue]
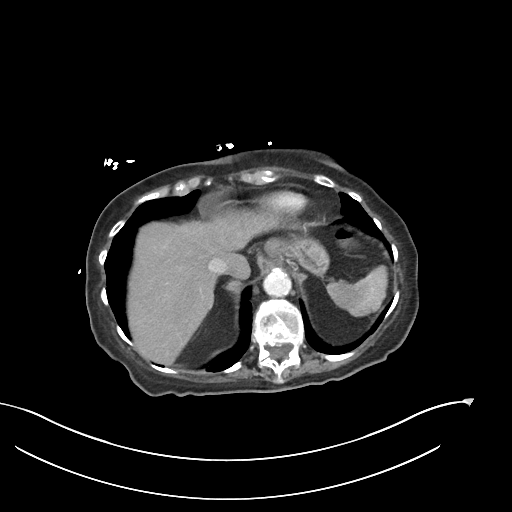
[im 73/78  soft-tissue]
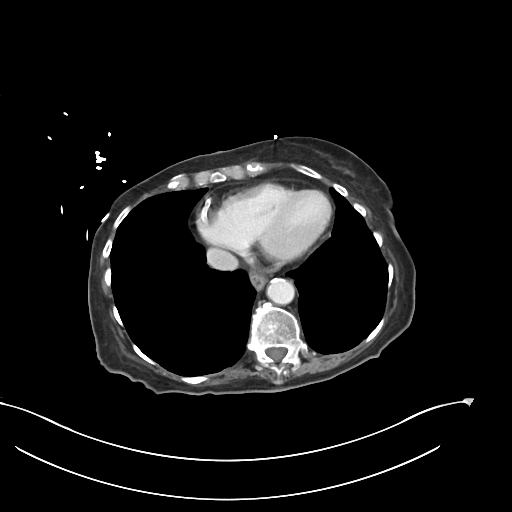

[Series 6: abdomen 3.0 mpr cor · coronal · 0.78mm/px · 3 of 92 slices shown]
[im 41/92  soft-tissue]
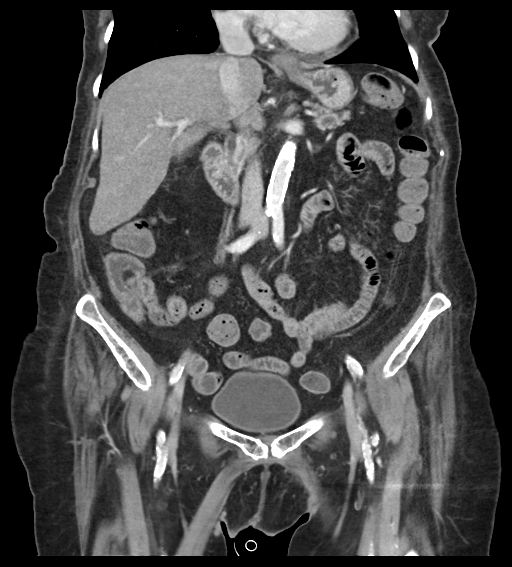
[im 51/92  soft-tissue]
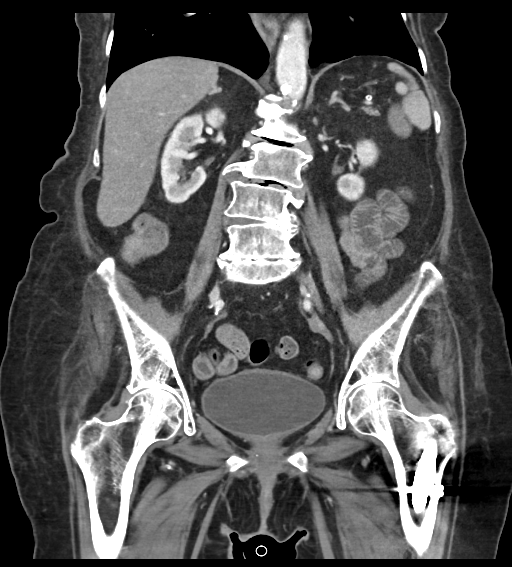
[im 61/92  soft-tissue]
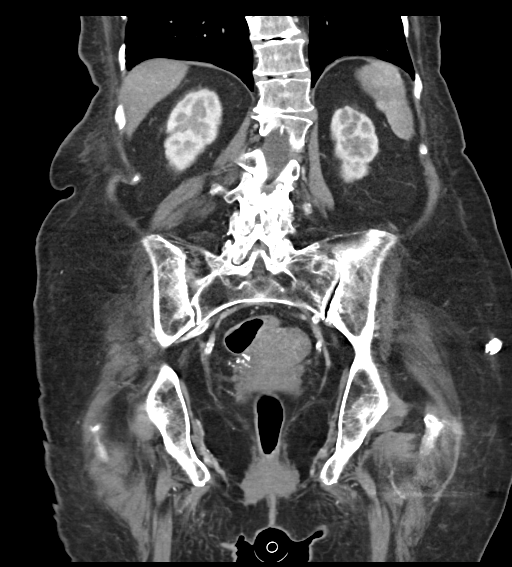

[17 of 46 positions shown; findings below may reference images not displayed]

FINDINGS: Lower chest: Lung bases are clear. No effusions. Heart is normal
size.

Hepatobiliary: Small layering stone within the gallbladder fundus.
No focal hepatic abnormality or biliary ductal dilatation.

Pancreas: Mildly prominent pancreatic duct, with a focal dilated
area in the pancreatic body measuring up to 6 mm. Pancreatic
atrophy. No focal abnormality.

Spleen: No focal abnormality.  Normal size.

Adrenals/Urinary Tract: No adrenal abnormality. No focal renal
abnormality. No stones or hydronephrosis. Urinary bladder is
unremarkable.

Stomach/Bowel: Stomach, large and small bowel grossly unremarkable.

Vascular/Lymphatic: Aortic atherosclerosis. No enlarged abdominal or
pelvic lymph nodes.

Reproductive: Uterus and adnexa unremarkable.  No mass.

Other: No free fluid or free air.

Musculoskeletal: Degenerative changes in the lumbar spine. Leftward
scoliosis. No acute bony abnormality.
IMPRESSION: Small layering dependent gallstone within the gallbladder.

No acute findings in the abdomen or pelvis.

Aortic atherosclerosis.

## 2019-03-08 IMAGING — CT CT ABD-PELV W/ CM
2 of 5 series · 14 of 46 positions shown, 16 images · IV contrast (APPLIED)
Comparison: 09/13/2018

CLINICAL DATA: Severe gastritis and duodenitis with ulcerative
disease, recent CT showed celiac and SMA vascular stenosis, for
further characterization.

EXAM:
CT ABDOMEN AND PELVIS WITH CONTRAST
TECHNIQUE: Multidetector CT imaging of the abdomen and pelvis was performed
using the standard protocol following bolus administration of
intravenous contrast.
CONTRAST:  30mL OMNIPAQUE IOHEXOL 300 MG/ML SOLN, 75mL OMNIPAQUE
IOHEXOL 300 MG/ML SOLN

[Series 2: axial st · axial · 0.84mm/px · z∈[-340,+34]mm · 11 of 87 slices shown, 13 images]
[im 6/87  soft-tissue]
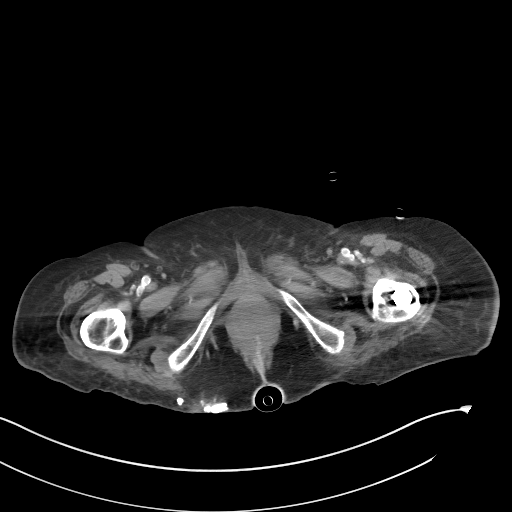
[im 6/87  bone]
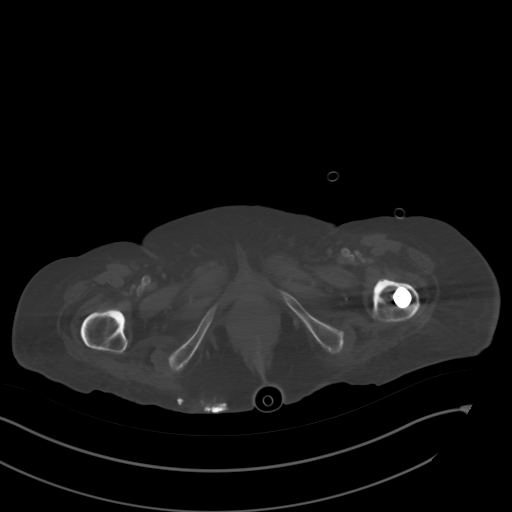
[im 16/87  soft-tissue]
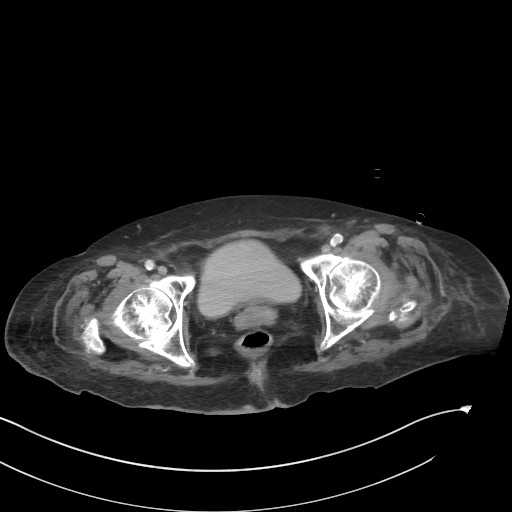
[im 21/87  soft-tissue]
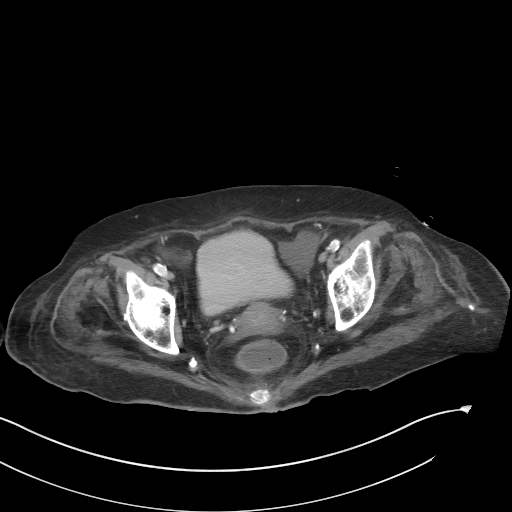
[im 31/87  soft-tissue]
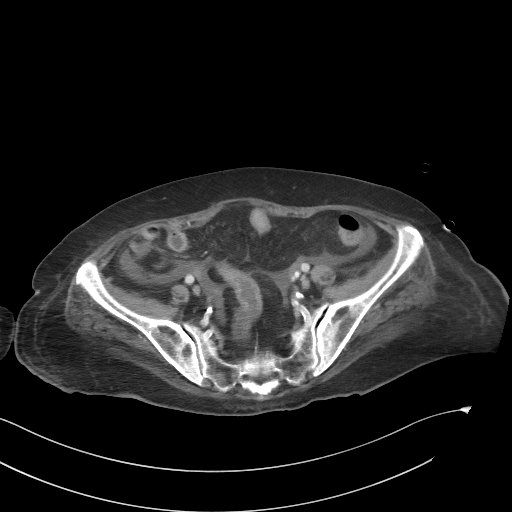
[im 36/87  soft-tissue]
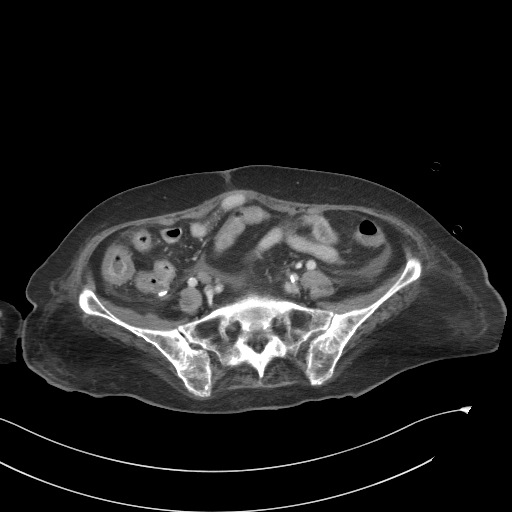
[im 46/87  soft-tissue]
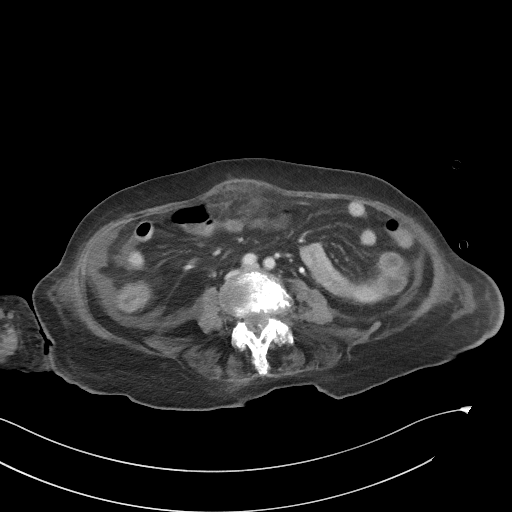
[im 51/87  soft-tissue]
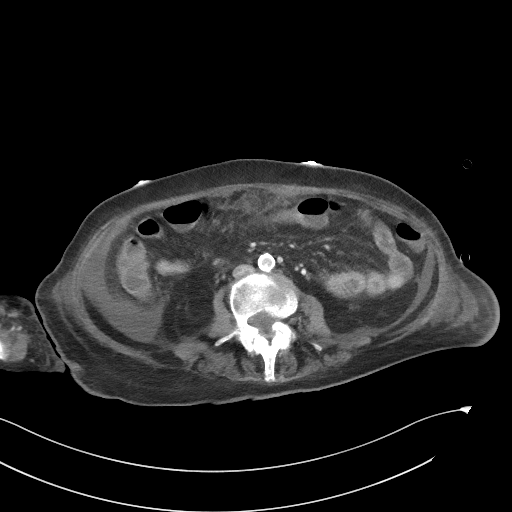
[im 56/87  soft-tissue]
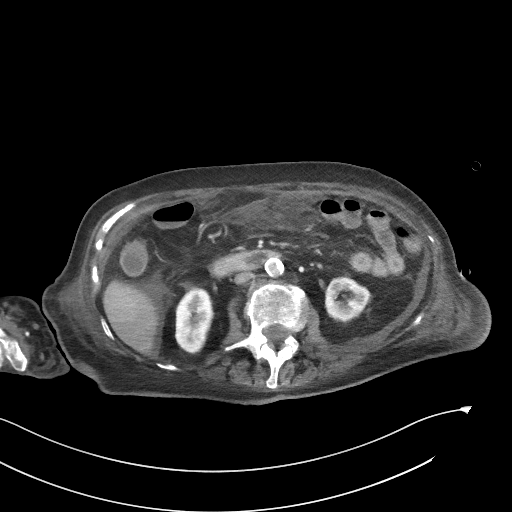
[im 66/87  soft-tissue]
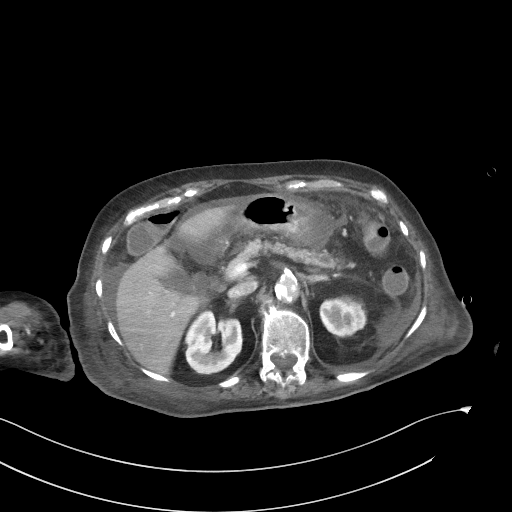
[im 66/87  bone]
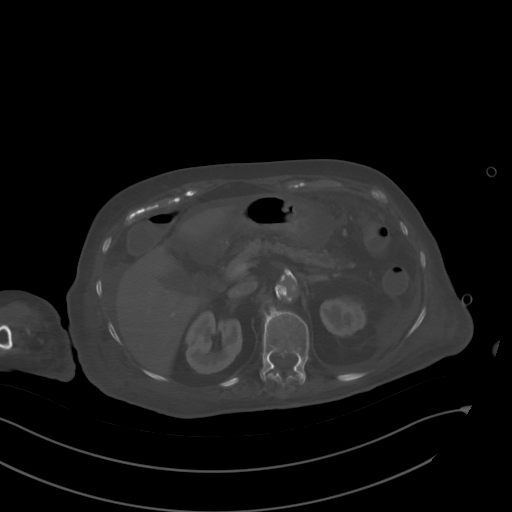
[im 71/87  soft-tissue]
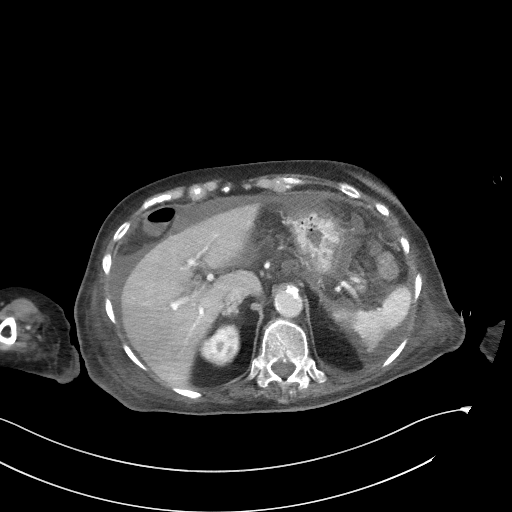
[im 81/87  soft-tissue]
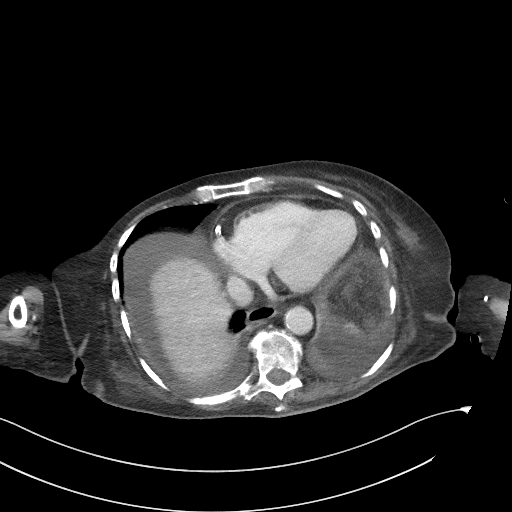

[Series 5: coronal st · coronal · 0.71mm/px · 3 of 69 slices shown]
[im 23/69  soft-tissue]
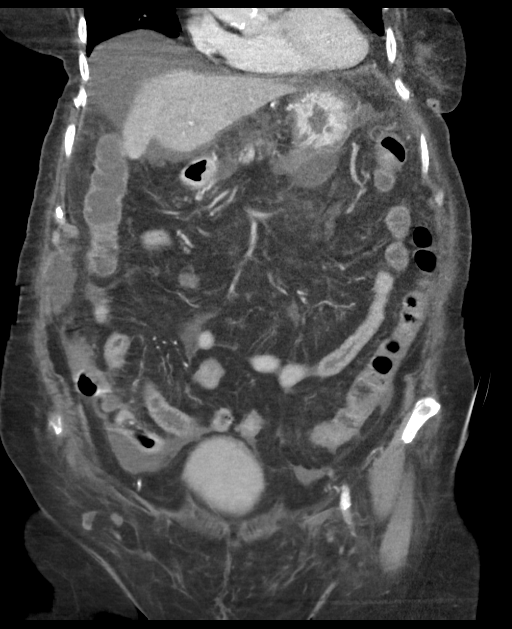
[im 31/69  soft-tissue]
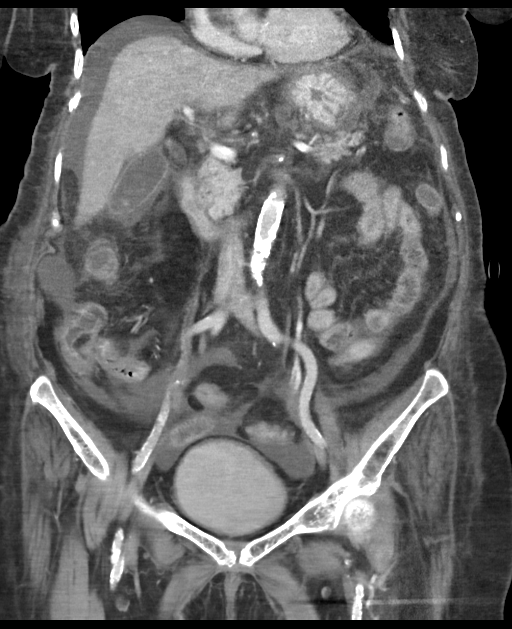
[im 38/69  soft-tissue]
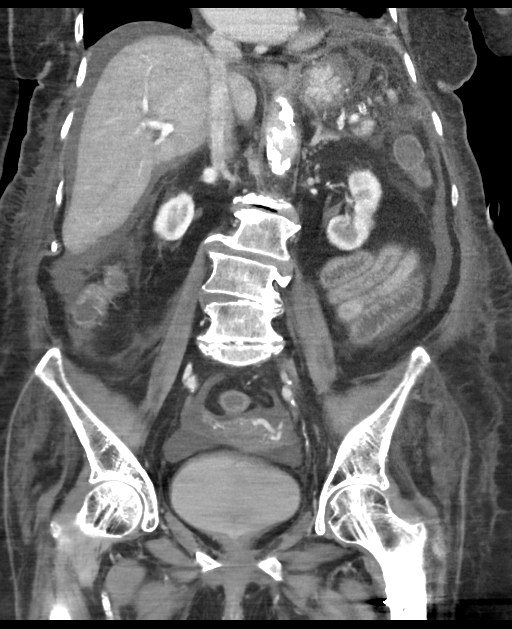

[14 of 46 positions shown; findings below may reference images not displayed]

FINDINGS: Lower chest: Moderate bilateral pleural effusions with passive
atelectasis. Mild cardiomegaly. Right coronary artery
atherosclerosis. Calcifications of the aortic valve noted. Implanted
loop recorder noted.

Hepatobiliary: 6 mm gallstone in the gallbladder. Mild gallbladder
wall thickening.

Pancreas: Atrophic pancreas. Small cystic lesion or focal dilatation
of the dorsal pancreatic duct in the pancreatic tail as noted on the
prior exam.

Spleen: Abnormal hypodensity posteromedially in the spleen, can not
exclude a small splenic infarct. Otherwise unremarkable.

Adrenals/Urinary Tract: Both adrenal glands appear normal. Mild left
renal atrophy. No hydronephrosis or hydroureter. Contrast medium is
present in the urinary bladder.

Stomach/Bowel: Continued wall thickening in the stomach. Small
low-density collections adjacent to the gastric wall likely
represent fluid. There is abnormal infiltration of the adjacent
omentum along the margin of the stomach. No gas within the gastric
or bowel wall.

There are air fluid levels in the distal colon and rectum compatible
with diarrheal process. No dilated bowel. Scattered tiny sigmoid
colon diverticula. There is bowel wall thickening in the cecum and
ascending colon.

Vascular/Lymphatic: Today's exam was not performed as a CT angiogram
but rather is a standard diagnostic CT. As on the prior exam, there
appears to be occlusion of the proximal SMA, with reconstitution
about 1.7 cm from the origin. The celiac trunk is some NG severely
diminutive and poorly seen the inferior mesenteric artery appears
patent and collaterals from the IMA appear reconstitute upper
abdominal vessels.

Reproductive: Unremarkable

Other: Moderate ascites. Infiltration of the omentum especially
extending from the stomach towards the umbilicus. Low-density
nodularity in the left upper quadrant omentum probably from small
fluid collections.

Musculoskeletal: Calcifications along the subcutaneous tissues the
buttocks below the ischial tuberosity levels. Left femur
intramedullary nail. Levoconvex thoracolumbar scoliosis.
IMPRESSION: 1. Increase in ascites and increase in mesenteric
inflammation/infiltration especially extending from the gastric
margin down to the umbilicus.
2. Similar appearance of what appears to be chronic occlusion of the
celiac trunk and SMA. There is reconstitution of the SMA about
cm distal to its origin, and also reconstitution of upper abdominal
vessels primarily from the inferior mesenteric artery.
3. Gastric wall thickening especially along the greater curvature,
where there is fluid along the gastric margin. A component of
ischemic gastritis not be readily excluded.
4. Wall thickening in the cecum and ascending colon suggesting
colitis, as well as distal air-fluid levels indicating a diarrheal
process. Strictly speaking, and ischemic cause for this colon wall
thickening is not readily excluded, although infection or
inflammatory bowel disease are also possible.
5. Cholelithiasis. Mild gallbladder wall thickening which could be
from hypoproteinemia/hypoalbuminemia, or inflammation.
6. Aortic Atherosclerosis ([id number]). Right coronary artery
atherosclerosis and calcifications of the aortic valve. Mild
cardiomegaly.
7. Atrophic pancreas. Small cystic lesion in the pancreatic tail,
follow up recommendations from the 09/13/2018 exam are still in
effect.
8. Suspected small splenic infarct.
9. Mild left renal atrophy.
